# Patient Record
Sex: Male | Born: 1940
Health system: Southern US, Community
[De-identification: ages and names within clinical notes are randomized; demographics above are authoritative.]

## PROBLEM LIST (undated history)

## (undated) DIAGNOSIS — Z87442 Personal history of urinary calculi: Secondary | ICD-10-CM

## (undated) DIAGNOSIS — Z972 Presence of dental prosthetic device (complete) (partial): Secondary | ICD-10-CM

## (undated) DIAGNOSIS — N401 Enlarged prostate with lower urinary tract symptoms: Secondary | ICD-10-CM

## (undated) DIAGNOSIS — F528 Other sexual dysfunction not due to a substance or known physiological condition: Secondary | ICD-10-CM

## (undated) DIAGNOSIS — G629 Polyneuropathy, unspecified: Secondary | ICD-10-CM

## (undated) DIAGNOSIS — C61 Malignant neoplasm of prostate: Secondary | ICD-10-CM

## (undated) DIAGNOSIS — E119 Type 2 diabetes mellitus without complications: Secondary | ICD-10-CM

## (undated) DIAGNOSIS — M21371 Foot drop, right foot: Secondary | ICD-10-CM

## (undated) DIAGNOSIS — T7840XA Allergy, unspecified, initial encounter: Secondary | ICD-10-CM

## (undated) DIAGNOSIS — N529 Male erectile dysfunction, unspecified: Secondary | ICD-10-CM

## (undated) DIAGNOSIS — E785 Hyperlipidemia, unspecified: Secondary | ICD-10-CM

## (undated) DIAGNOSIS — M216X9 Other acquired deformities of unspecified foot: Secondary | ICD-10-CM

## (undated) DIAGNOSIS — E538 Deficiency of other specified B group vitamins: Secondary | ICD-10-CM

## (undated) DIAGNOSIS — N281 Cyst of kidney, acquired: Secondary | ICD-10-CM

## (undated) DIAGNOSIS — I10 Essential (primary) hypertension: Secondary | ICD-10-CM

## (undated) DIAGNOSIS — Z789 Other specified health status: Secondary | ICD-10-CM

## (undated) DIAGNOSIS — K5909 Other constipation: Secondary | ICD-10-CM

## (undated) DIAGNOSIS — G4700 Insomnia, unspecified: Secondary | ICD-10-CM

## (undated) HISTORY — DX: Personal history of urinary calculi: Z87.442

## (undated) HISTORY — DX: Other acquired deformities of unspecified foot: M21.6X9

## (undated) HISTORY — DX: Other sexual dysfunction not due to a substance or known physiological condition: F52.8

## (undated) HISTORY — PX: OTHER SURGICAL HISTORY: SHX169

## (undated) HISTORY — PX: LUMBAR MICRODISCECTOMY: SHX99

## (undated) HISTORY — PX: LUMBAR EPIDURAL INJECTION: SHX1980

## (undated) HISTORY — DX: Allergy, unspecified, initial encounter: T78.40XA

## (undated) HISTORY — DX: Type 2 diabetes mellitus without complications: E11.9

## (undated) HISTORY — PX: LUMBAR DISC SURGERY: SHX700

## (undated) HISTORY — PX: CARPAL TUNNEL RELEASE: SHX101

## (undated) HISTORY — DX: Hyperlipidemia, unspecified: E78.5

---
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
## 2000-08-25 ENCOUNTER — Encounter: Admission: RE | Admit: 2000-08-25 | Discharge: 2000-11-23 | Payer: Self-pay | Admitting: Internal Medicine

## 2001-12-25 ENCOUNTER — Encounter: Admission: RE | Admit: 2001-12-25 | Discharge: 2001-12-25 | Payer: Self-pay | Admitting: Orthopedic Surgery

## 2001-12-25 ENCOUNTER — Encounter: Payer: Self-pay | Admitting: Orthopedic Surgery

## 2002-01-15 ENCOUNTER — Encounter: Admission: RE | Admit: 2002-01-15 | Discharge: 2002-01-15 | Payer: Self-pay | Admitting: Orthopedic Surgery

## 2002-01-15 ENCOUNTER — Encounter: Payer: Self-pay | Admitting: Orthopedic Surgery

## 2002-01-29 ENCOUNTER — Encounter: Admission: RE | Admit: 2002-01-29 | Discharge: 2002-01-29 | Payer: Self-pay | Admitting: Orthopedic Surgery

## 2002-01-29 ENCOUNTER — Encounter: Payer: Self-pay | Admitting: Orthopedic Surgery

## 2002-05-10 ENCOUNTER — Encounter: Payer: Self-pay | Admitting: *Deleted

## 2002-05-13 ENCOUNTER — Encounter: Payer: Self-pay | Admitting: *Deleted

## 2002-05-13 ENCOUNTER — Ambulatory Visit (HOSPITAL_COMMUNITY): Admission: RE | Admit: 2002-05-13 | Discharge: 2002-05-14 | Payer: Self-pay | Admitting: *Deleted

## 2003-04-14 ENCOUNTER — Emergency Department (HOSPITAL_COMMUNITY): Admission: AD | Admit: 2003-04-14 | Discharge: 2003-04-14 | Payer: Self-pay | Admitting: Emergency Medicine

## 2003-04-14 ENCOUNTER — Encounter: Payer: Self-pay | Admitting: Emergency Medicine

## 2004-06-14 ENCOUNTER — Ambulatory Visit: Payer: Self-pay | Admitting: Internal Medicine

## 2004-07-13 ENCOUNTER — Ambulatory Visit: Payer: Self-pay | Admitting: Internal Medicine

## 2005-01-04 ENCOUNTER — Ambulatory Visit: Payer: Self-pay | Admitting: Internal Medicine

## 2005-01-09 ENCOUNTER — Ambulatory Visit: Payer: Self-pay | Admitting: Internal Medicine

## 2005-01-09 ENCOUNTER — Encounter (INDEPENDENT_AMBULATORY_CARE_PROVIDER_SITE_OTHER): Payer: Self-pay | Admitting: *Deleted

## 2005-06-28 ENCOUNTER — Ambulatory Visit: Payer: Self-pay | Admitting: Internal Medicine

## 2006-01-03 ENCOUNTER — Ambulatory Visit: Payer: Self-pay | Admitting: Internal Medicine

## 2006-01-06 ENCOUNTER — Ambulatory Visit: Payer: Self-pay | Admitting: Internal Medicine

## 2006-01-31 ENCOUNTER — Ambulatory Visit: Payer: Self-pay | Admitting: Internal Medicine

## 2006-06-27 ENCOUNTER — Ambulatory Visit: Payer: Self-pay | Admitting: Internal Medicine

## 2006-07-11 ENCOUNTER — Ambulatory Visit: Payer: Self-pay

## 2007-01-02 ENCOUNTER — Ambulatory Visit: Payer: Self-pay | Admitting: Internal Medicine

## 2007-01-02 LAB — CONVERTED CEMR LAB
ALT: 27 units/L (ref 0–53)
AST: 21 units/L (ref 0–37)
BUN: 17 mg/dL (ref 6–23)
CO2: 30 meq/L (ref 19–32)
Calcium: 9.4 mg/dL (ref 8.4–10.5)
Chloride: 107 meq/L (ref 96–112)
Cholesterol: 140 mg/dL (ref 0–200)
Creatinine, Ser: 0.8 mg/dL (ref 0.4–1.5)
GFR calc Af Amer: 125 mL/min
GFR calc non Af Amer: 103 mL/min
Glucose, Bld: 80 mg/dL (ref 70–99)
HDL: 48.8 mg/dL (ref >39.0)
Hgb A1c MFr Bld: 6.5 % — ABNORMAL HIGH (ref 4.6–6.0)
LDL Cholesterol: 85 mg/dL (ref 0–99)
PSA: 1.66 ng/mL (ref 0.10–4.00)
Potassium: 3.7 meq/L (ref 3.5–5.1)
Sodium: 143 meq/L (ref 135–145)
Total CHOL/HDL Ratio: 2.9
Triglycerides: 31 mg/dL (ref 0–149)
VLDL: 6 mg/dL (ref 0–40)

## 2007-06-19 ENCOUNTER — Ambulatory Visit: Payer: Self-pay | Admitting: Internal Medicine

## 2007-06-19 LAB — CONVERTED CEMR LAB
ALT: 22 units/L (ref 0–53)
AST: 20 units/L (ref 0–37)
Albumin: 3.6 g/dL (ref 3.5–5.2)
Alkaline Phosphatase: 69 units/L (ref 39–117)
BUN: 13 mg/dL (ref 6–23)
Basophils Absolute: 0 10*3/uL (ref 0.0–0.1)
Basophils Relative: 0.2 % (ref 0.0–1.0)
Bilirubin Urine: NEGATIVE
Bilirubin, Direct: 0.2 mg/dL (ref 0.0–0.3)
CO2: 31 meq/L (ref 19–32)
Calcium: 9.6 mg/dL (ref 8.4–10.5)
Chloride: 106 meq/L (ref 96–112)
Cholesterol: 141 mg/dL (ref 0–200)
Creatinine, Ser: 0.9 mg/dL (ref 0.4–1.5)
Eosinophils Absolute: 0.3 10*3/uL (ref 0.0–0.6)
Eosinophils Relative: 6.6 % — ABNORMAL HIGH (ref 0.0–5.0)
GFR calc Af Amer: 109 mL/min
GFR calc non Af Amer: 90 mL/min
Glucose, Bld: 115 mg/dL — ABNORMAL HIGH (ref 70–99)
HCT: 40.9 % (ref 39.0–52.0)
HDL: 43.7 mg/dL (ref >39.0)
Hemoglobin, Urine: NEGATIVE
Hemoglobin: 13.5 g/dL (ref 13.0–17.0)
Ketones, ur: NEGATIVE mg/dL
LDL Cholesterol: 87 mg/dL (ref 0–99)
Leukocytes, UA: NEGATIVE
Lymphocytes Relative: 50.5 % — ABNORMAL HIGH (ref 12.0–46.0)
MCHC: 33.1 g/dL (ref 30.0–36.0)
MCV: 79.8 fL (ref 78.0–100.0)
Monocytes Absolute: 0.4 10*3/uL (ref 0.2–0.7)
Monocytes Relative: 10.1 % (ref 3.0–11.0)
Neutro Abs: 1.4 10*3/uL (ref 1.4–7.7)
Neutrophils Relative %: 32.6 % — ABNORMAL LOW (ref 43.0–77.0)
Nitrite: NEGATIVE
PSA: 1.23 ng/mL (ref 0.10–4.00)
Platelets: 163 10*3/uL (ref 150–400)
Potassium: 4.4 meq/L (ref 3.5–5.1)
RBC: 5.13 M/uL (ref 4.22–5.81)
RDW: 12.8 % (ref 11.5–14.6)
Sodium: 143 meq/L (ref 135–145)
Specific Gravity, Urine: 1.02 (ref 1.000–1.03)
TSH: 0.78 microintl units/mL (ref 0.35–5.50)
Total Bilirubin: 1.1 mg/dL (ref 0.3–1.2)
Total CHOL/HDL Ratio: 3.2
Total Protein, Urine: NEGATIVE mg/dL
Total Protein: 6.8 g/dL (ref 6.0–8.3)
Triglycerides: 54 mg/dL (ref 0–149)
Urine Glucose: NEGATIVE mg/dL
Urobilinogen, UA: 0.2 (ref 0.0–1.0)
VLDL: 11 mg/dL (ref 0–40)
WBC: 4.4 10*3/uL — ABNORMAL LOW (ref 4.5–10.5)
pH: 6 (ref 5.0–8.0)

## 2007-06-21 ENCOUNTER — Encounter: Payer: Self-pay | Admitting: Internal Medicine

## 2007-06-25 ENCOUNTER — Encounter: Payer: Self-pay | Admitting: Internal Medicine

## 2007-06-25 DIAGNOSIS — M216X9 Other acquired deformities of unspecified foot: Secondary | ICD-10-CM | POA: Insufficient documentation

## 2007-06-25 DIAGNOSIS — E119 Type 2 diabetes mellitus without complications: Secondary | ICD-10-CM | POA: Insufficient documentation

## 2007-06-25 DIAGNOSIS — Z87442 Personal history of urinary calculi: Secondary | ICD-10-CM | POA: Insufficient documentation

## 2007-06-26 ENCOUNTER — Ambulatory Visit: Payer: Self-pay | Admitting: Internal Medicine

## 2007-06-26 DIAGNOSIS — E785 Hyperlipidemia, unspecified: Secondary | ICD-10-CM | POA: Insufficient documentation

## 2007-08-02 ENCOUNTER — Encounter: Admission: RE | Admit: 2007-08-02 | Discharge: 2007-08-02 | Payer: Self-pay | Admitting: Orthopedic Surgery

## 2007-09-03 ENCOUNTER — Ambulatory Visit: Payer: Self-pay | Admitting: Internal Medicine

## 2007-09-03 DIAGNOSIS — R634 Abnormal weight loss: Secondary | ICD-10-CM | POA: Insufficient documentation

## 2007-09-03 LAB — CONVERTED CEMR LAB
Basophils Absolute: 0 10*3/uL (ref 0.0–0.1)
Basophils Relative: 0.1 % (ref 0.0–1.0)
CO2: 31 meq/L (ref 19–32)
Creatinine, Ser: 0.8 mg/dL (ref 0.4–1.5)
GFR calc Af Amer: 125 mL/min
HCT: 44.6 % (ref 39.0–52.0)
Hemoglobin: 14.2 g/dL (ref 13.0–17.0)
Lymphocytes Relative: 35.5 % (ref 12.0–46.0)
MCHC: 31.9 g/dL (ref 30.0–36.0)
Monocytes Absolute: 0.4 10*3/uL (ref 0.2–0.7)
Monocytes Relative: 5.7 % (ref 3.0–11.0)
Neutro Abs: 3.9 10*3/uL (ref 1.4–7.7)
Neutrophils Relative %: 58 % (ref 43.0–77.0)
Potassium: 4.3 meq/L (ref 3.5–5.1)
RDW: 13.1 % (ref 11.5–14.6)
Sodium: 140 meq/L (ref 135–145)

## 2007-09-04 ENCOUNTER — Ambulatory Visit: Payer: Self-pay | Admitting: Internal Medicine

## 2007-09-09 ENCOUNTER — Encounter: Payer: Self-pay | Admitting: Internal Medicine

## 2008-01-06 ENCOUNTER — Ambulatory Visit: Payer: Self-pay | Admitting: Internal Medicine

## 2008-01-06 LAB — CONVERTED CEMR LAB
Chloride: 104 meq/L (ref 96–112)
GFR calc Af Amer: 109 mL/min
GFR calc non Af Amer: 90 mL/min
Hgb A1c MFr Bld: 6.1 % — ABNORMAL HIGH (ref 4.6–6.0)
LDL Cholesterol: 77 mg/dL (ref 0–99)
PSA: 1.15 ng/mL (ref 0.10–4.00)
Potassium: 3.9 meq/L (ref 3.5–5.1)
Sodium: 141 meq/L (ref 135–145)
VLDL: 20 mg/dL (ref 0–40)

## 2008-01-07 ENCOUNTER — Encounter: Payer: Self-pay | Admitting: Internal Medicine

## 2008-01-15 ENCOUNTER — Encounter: Payer: Self-pay | Admitting: Internal Medicine

## 2008-04-04 ENCOUNTER — Ambulatory Visit: Payer: Self-pay | Admitting: Internal Medicine

## 2008-04-04 DIAGNOSIS — F528 Other sexual dysfunction not due to a substance or known physiological condition: Secondary | ICD-10-CM | POA: Insufficient documentation

## 2008-04-05 ENCOUNTER — Telehealth: Payer: Self-pay | Admitting: Internal Medicine

## 2008-06-20 ENCOUNTER — Ambulatory Visit: Payer: Self-pay | Admitting: Internal Medicine

## 2008-06-20 LAB — CONVERTED CEMR LAB
ALT: 29 units/L (ref 0–53)
AST: 23 units/L (ref 0–37)
Albumin: 4.2 g/dL (ref 3.5–5.2)
Alkaline Phosphatase: 73 units/L (ref 39–117)
BUN: 14 mg/dL (ref 6–23)
CO2: 29 meq/L (ref 19–32)
Chloride: 105 meq/L (ref 96–112)
Cholesterol: 170 mg/dL (ref 0–200)
GFR calc non Af Amer: 90 mL/min
Glucose, Bld: 98 mg/dL (ref 70–99)
Potassium: 3.8 meq/L (ref 3.5–5.1)

## 2009-01-12 ENCOUNTER — Ambulatory Visit: Payer: Self-pay | Admitting: Internal Medicine

## 2009-01-12 LAB — CONVERTED CEMR LAB
AST: 28 units/L (ref 0–37)
Bilirubin, Direct: 0.2 mg/dL (ref 0.0–0.3)
CO2: 30 meq/L (ref 19–32)
Calcium: 9.8 mg/dL (ref 8.4–10.5)
Glucose, Bld: 83 mg/dL (ref 70–99)
HDL: 62 mg/dL (ref >39.00)
Potassium: 4 meq/L (ref 3.5–5.1)
Sodium: 139 meq/L (ref 135–145)
Total Bilirubin: 1 mg/dL (ref 0.3–1.2)
Total CHOL/HDL Ratio: 3

## 2009-01-16 ENCOUNTER — Encounter: Payer: Self-pay | Admitting: Internal Medicine

## 2009-01-16 LAB — HM DIABETES FOOT EXAM

## 2009-01-25 ENCOUNTER — Telehealth: Payer: Self-pay | Admitting: Internal Medicine

## 2009-01-26 ENCOUNTER — Telehealth: Payer: Self-pay | Admitting: Internal Medicine

## 2009-01-27 ENCOUNTER — Telehealth: Payer: Self-pay | Admitting: Internal Medicine

## 2009-02-22 ENCOUNTER — Telehealth: Payer: Self-pay | Admitting: Internal Medicine

## 2009-02-23 ENCOUNTER — Telehealth: Payer: Self-pay | Admitting: Internal Medicine

## 2009-03-16 ENCOUNTER — Telehealth: Payer: Self-pay | Admitting: Internal Medicine

## 2009-04-18 ENCOUNTER — Ambulatory Visit: Payer: Self-pay | Admitting: Internal Medicine

## 2009-04-19 ENCOUNTER — Ambulatory Visit: Payer: Self-pay | Admitting: Internal Medicine

## 2009-06-05 ENCOUNTER — Ambulatory Visit: Payer: Self-pay | Admitting: Internal Medicine

## 2009-06-05 LAB — CONVERTED CEMR LAB
Albumin: 4.2 g/dL (ref 3.5–5.2)
Chloride: 105 meq/L (ref 96–112)
Cholesterol: 177 mg/dL (ref 0–200)
Hgb A1c MFr Bld: 7 % — ABNORMAL HIGH (ref 4.6–6.5)
LDL Cholesterol: 98 mg/dL (ref 0–99)
Potassium: 3.9 meq/L (ref 3.5–5.1)
Total Protein: 7.3 g/dL (ref 6.0–8.3)
Triglycerides: 76 mg/dL (ref 0.0–149.0)
VLDL: 15.2 mg/dL (ref 0.0–40.0)

## 2009-11-14 ENCOUNTER — Telehealth: Payer: Self-pay | Admitting: Internal Medicine

## 2010-01-02 ENCOUNTER — Ambulatory Visit: Payer: Self-pay | Admitting: Internal Medicine

## 2010-01-02 LAB — CONVERTED CEMR LAB
ALT: 37 units/L (ref 0–53)
BUN: 12 mg/dL (ref 6–23)
Cholesterol: 181 mg/dL (ref 0–200)
GFR calc non Af Amer: 141.9 mL/min (ref >60)
HDL: 69.1 mg/dL (ref >39.00)
Hgb A1c MFr Bld: 6.5 % (ref 4.6–6.5)
Potassium: 4 meq/L (ref 3.5–5.1)
Sodium: 142 meq/L (ref 135–145)
Total Protein: 7.1 g/dL (ref 6.0–8.3)
VLDL: 31.8 mg/dL (ref 0.0–40.0)

## 2010-01-11 ENCOUNTER — Encounter (INDEPENDENT_AMBULATORY_CARE_PROVIDER_SITE_OTHER): Payer: Self-pay | Admitting: *Deleted

## 2010-01-17 ENCOUNTER — Encounter: Payer: Self-pay | Admitting: Internal Medicine

## 2010-01-24 ENCOUNTER — Telehealth: Payer: Self-pay | Admitting: Internal Medicine

## 2010-03-09 ENCOUNTER — Telehealth: Payer: Self-pay | Admitting: Internal Medicine

## 2010-03-26 ENCOUNTER — Telehealth: Payer: Self-pay | Admitting: Internal Medicine

## 2010-04-12 ENCOUNTER — Ambulatory Visit: Payer: Self-pay | Admitting: Internal Medicine

## 2010-08-09 NOTE — Progress Notes (Signed)
  Phone Note Refill Request Message from:  Fax from Pharmacy on Nov 14, 2009 2:57 PM  Refills Requested: Medication #1:  ALPRAZOLAM 0.25 MG  TABS 1 q6 as needed   Last Refilled: 06/05/2009  Is this ok to fill for pt to [facility]  Initial call taken by: Rock Nephew CMA,  Nov 14, 2009 2:57 PM  Follow-up for Phone Call        ok fro refill x 5 Follow-up by: Jacques Navy MD,  Nov 14, 2009 5:39 PM    Prescriptions: ALPRAZOLAM 0.25 MG  TABS (ALPRAZOLAM) 1 q6 as needed  #30 x 5   Entered by:   Ami Bullins CMA   Authorized by:   Jacques Navy MD   Signed by:   Bill Salinas CMA on 11/15/2009   Method used:   Telephoned to ...       [facility] [address] [phone]* (retail)       [address]       [place], [place]  [address]       Ph: [phone] or [phone]       Fax: [phone]   RxID:   [id number]
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
## 2010-08-09 NOTE — Letter (Signed)
Summary: [facility]   Imported By: Lennie Odor 01/25/2010 10:24:21  _____________________________________________________________________  External Attachment:    Type:   Image     Comment:   External Document

## 2010-08-09 NOTE — Progress Notes (Signed)
  Phone Note Call from Patient Call back at Home Phone [phone]   Caller: Patient Call For: Jacques Navy MD Summary of Call: Pt needs cholesterol medicine,simvistatin. Pt gets this mail order and had not rec'd. Mail order is [facility]. Initial call taken by: Verdell Face,  March 09, 2010 12:31 PM  Follow-up for Phone Call        Pt informed  Follow-up by: Lamar Sprinkles, CMA,  March 09, 2010 3:12 PM    Prescriptions: SIMVASTATIN 20 MG TABS (SIMVASTATIN) Take 1 tablet by mouth every night  #14 x 1   Entered by:   Lamar Sprinkles, CMA   Authorized by:   Jacques Navy MD   Signed by:   Lamar Sprinkles, CMA on 03/09/2010   Method used:   Electronically to        [facility] (retail)       [address]       [place], [place]  [address]       Ph: [phone]       Fax: [phone]   RxID:   [id number] SIMVASTATIN 20 MG TABS (SIMVASTATIN) Take 1 tablet by mouth every night  #90 x 3   Entered by:   Lamar Sprinkles, CMA   Authorized by:   Jacques Navy MD   Signed by:   Lamar Sprinkles, CMA on 03/09/2010   Method used:   Faxed to ...       [facility]* (retail)             ,          Ph: [phone]       Fax: [phone]   RxID:   [id number]

## 2010-08-09 NOTE — Progress Notes (Signed)
       Past History:  Care Management: Ophthalmology:Pt had diabetic eye exam 01/17/2010 at [facility]. No diabetic rentinopathy detected. Repeat eye exam in 1 year. Dr Antony Contras

## 2010-08-09 NOTE — Assessment & Plan Note (Signed)
Summary: FLU SHOT/NWS  Nurse Visit   Allergies: No Known Drug Allergies  Orders Added: 1)  Flu Vaccine 3yrs + MEDICARE PATIENTS [Q2039] 2)  Administration Flu vaccine - MCR [G0008] Flu Vaccine Consent Questions     Do you have a history of severe allergic reactions to this vaccine? no    Any prior history of allergic reactions to egg and/or gelatin? no    Do you have a sensitivity to the preservative Thimersol? no    Do you have a past history of Guillan-Barre Syndrome? no    Do you currently have an acute febrile illness? no    Have you ever had a severe reaction to latex? no    Vaccine information given and explained to patient? yes    Are you currently pregnant? no    Lot Number:[id number]   Exp Date:01/05/2011   Site Given  Left Deltoid IM 

## 2010-08-09 NOTE — Progress Notes (Signed)
Summary: Pharm problem?   Phone Note Call from Patient   Summary of Call: Pt left vm that too many mistakes have been made regarding getting his refills. He is req that [name] "look into this".  Initial call taken by: Lamar Sprinkles, CMA,  March 26, 2010 2:03 PM  Follow-up for Phone Call        No requests from pharmacy for refills. Left detailed vm on home # stating the last rx request dates and when& where they were sent. ALso asked the patient to call the office back with any further questions or refill requests.  Follow-up by: Lamar Sprinkles, CMA,  March 30, 2010 6:51 PM  Additional Follow-up for Phone Call Additional follow up Details #1::        Left another vm for pt to contact office if we could be of any further help Additional Follow-up by: Lamar Sprinkles, CMA,  April 05, 2010 3:53 PM

## 2010-08-09 NOTE — Letter (Signed)
Summary: Colonoscopy Date Change Letter  [facility]  [address] [place], [place] [address]   Phone: [phone]  Fax: [phone]      January 11, 2010 MRN: [id number]   Alex Massey [address] [place], [place]  [address]   Dear Alex Massey,   Previously you were recommended to have a repeat colonoscopy around this time. Your chart was recently reviewed by Dr. Hedwig Morton. Juanda Chance of [facility]. Follow up colonoscopy is now recommended in July 2016. This revised recommendation is based on current, nationally recognized guidelines for colorectal cancer screening and polyp surveillance. These guidelines are endorsed by the American Cancer Society, [employer] on Colorectal Cancer as well as numerous other major medical organizations.  Please understand that our recommendation assumes that you do not have any new symptoms such as bleeding, a change in bowel habits, anemia, or significant abdominal discomfort. If you do have any concerning GI symptoms or want to discuss the guideline recommendations, please call to arrange an office visit at your earliest convenience. Otherwise we will keep you in our reminder system and contact you 1-2 months prior to the date listed above to schedule your next colonoscopy.  Thank you,  Hedwig Morton. Juanda Chance, M.D.  [facility] [phone]

## 2010-11-20 NOTE — Assessment & Plan Note (Signed)
Alex Massey                           PRIMARY CARE OFFICE NOTE   NAME:Alex Massey                    MRN:          [id number]  DATE:01/02/2007                            DOB:          Nov 16, 1940    [name] is a delightful 69 year old African American gentleman who  presents today for followup evaluation and exam.  He was last seen in  the office June 27, 2006 for followup of his diabetes which was  generally well controlled with a hemoglobin A1c of 6%.   INTERVAL HISTORY:  The patient reports he has been doing well.  He asks  about multivitamins, but does admit to have a good diet.  He asks about  vitamin D but he does get a significant amount of sun exposure.  Patient  has had a 4 pounds weight loss over the last 5 months but actually is  close to his baseline weight.  He does work in a very hot environment.   Past medical history is well documented in my note of January 06, 2006 as is  family history and social history.   REVIEW OF SYSTEMS:  Is negative for any constitutional, cardiovascular,  respiratory, GI, or GU problems.   EXAMINATION:  Temperature was 98, blood pressure 124/79, pulse 74,  weight 150.8.  GENERAL APPEARANCE:  A well-nourished, well-developed African American  gentleman looking younger than his states chronologic age in no acute  distress.  HEENT:  Normocephalic/atraumatic, EACs and TMs were normal.  Oropharynx  with native dentition in good repair, no buccal or palatal lesions were  noted, posterior pharynx was clear.  Conjunctivae and sclerae was clear.  PERRLA/EOMI, funduscopic exam was unremarkable.  NECK:  Supple without thyromegaly.  NODES:  No adenopathy was noted in the cervical, supraclavicular  regions.  CHEST:  No CVA tenderness.  LUNGS:  Clear to auscultation and percussion.  CARDIOVASCULAR:  With 2+ radial pulse, no JVD or carotid bruits.  He had  a quiet precordium with a regular rate and rhythm  without murmurs, rubs,  or gallops.  ABDOMEN:  Soft, no guarding or rebound, no organosplenomegaly was noted.  GENITALIA:  Normal.  RECTAL:  Normal sphincter tone was noted.  Prostate is smooth, round and  normal in size and contour without modules.  EXTREMITIES:  Without clubbing, cyanosis, edema, or deformity.  NEUROLOGIC:  Exam was nonfocal.   LABORATORY:  Basic metabolic panel was normal with potassium of 3.7,  serum glucose was 80.  Kidney function was normal with a creatinine of  0.8.  Liver functions were normal with an SGPT of 27, SGOT of 21.  Lipid  panel with a cholesterol of 140, triglycerides of 31, LDL was normal at  85, HDL was normal at 48.8.  Hemoglobin A1c was 6.5%.  PSA was normal at  1.66.   ASSESSMENT/PLAN:  1. Diabetes.  Patient is very well-controlled on his present medical      regimen of Amaryl 2 mg daily and he will continue the same.  2. Hyperlipidemia.  Patient is at goal with an LDL of less than 100 on  low dose simvastatin and he will continue the same.  3. Health maintenance.  The patient did have a nuclear stress test      July 11, 2006 which was read out as a negative study with no      evidence of ischemia.  Last colonoscopy was performed January 09, 2005,      it was a normal study and patient would be a candidate for followup      study in 5 years making it 2011.   In summary he is a very delightful gentleman who seems to be medically  stable at this time.  He will continue on his medications.  Prescriptions were provided as needed.  He will return to see me in 6  months or on a p.r.n. basis.     Rosalyn Gess Norins, MD  Electronically Signed    MEN/MedQ  DD: 01/04/2007  DT: 01/04/2007  Job #: [id number]   cc:   Alex Massey

## 2010-11-23 NOTE — Op Note (Signed)
NAMEGERVIS, Alex Massey                       ACCOUNT NO.:  [id number]   MEDICAL RECORD NO.:  [id number]                   PATIENT TYPE:  OIB   LOCATION:  2550                                 FACILITY:  [facility]   PHYSICIAN:  Reynolds Bowl, M.D.                 DATE OF BIRTH:  1940/07/18   DATE OF PROCEDURE:  DATE OF DISCHARGE:                                 OPERATIVE REPORT   PREOPERATIVE DIAGNOSES:  1. Recurrent herniated nucleus pulposus, status post postoperative     diskectomy in 1984 for the recurrent L4-5 herniated nucleus pulposus,     right.  2. Status post postoperative meniscectomy in 1984.   POSTOPERATIVE DIAGNOSES:  1. Recurrent herniated nucleus pulposus, status post postoperative     diskectomy in 1984 for the recurrent L4-5 herniated nucleus pulposus,     right.  2. Status post postoperative meniscectomy in 1984.   PROCEDURE:  Microscopic assisted diskectomy, foraminotomy with complete  nerve root decompression.   SURGEON:  Reynolds Bowl, M.D.   ASSISTANT:  Humberto Leep. Wyonia Hough, M.D.   ANESTHESIA:  General.   DESCRIPTION OF PROCEDURE:  The patient had an IV started and was given  2 gm  of Ancef. He was taken to the operating room. He was given a general  anesthetic by endotracheal tube. He was  placed in the [place] frame. He  was  prepped and draped in the usual manner for sterility.   I used little more than the initial incision. I went down to what I thought  was the L4-5 interspace. I took x-rays and confirmed. The paraspinal muscles  were reflected subperiosteally laterally, exposing  then the lamina of L4  and L5 with the right interspace. Using small curets I  went inferiorly,  laterally and superiorly until all the soft tissue was freed, and carefully  dissected further laterally, proximally and  distally and then unroofed the  lateral bone to the level of the pedicle.   I then went out laterally distal to the pedicle with a foraminotomy. I  followed this by careful dissection of the nerve root and this allowed the  nerve root to be moved medially. It was fairly tight and there was disk  material up under the annulus and under the superior edge of the L5. There  was disk material contributing to the nerve tightness.   The annulus was very soft and bulging. This was opened, and using Epsteins,  I pushed tissue out from this pocket described above and freed up disk  tissue posteriorly, and then with multiple passes of the various rongeurs  removed disk material down to a fairly empty area that had no more easily  removable material.  The nerve root then could be moved fairly easily and a  probe was passed under the nerve root medially and distally all around the  pedicle and it all seemed to be free.  Having  done that, the interspace was irrigated. I removed the [facility]  retractor. We repaired the lumbar fascia with figure-of-eight sutures and #1  Vicryl. We approximated the more superficial tissues with 2-0 Vicryl and  then approximated the skin with metal staples and applied a dressing.   The patient returned to the recovery room in good condition.  Estimated blood loss was negligible.                                                Reynolds Bowl, M.D.    JWK/MEDQ  D:  05/13/2002  T:  05/13/2002  Job:  [id number]

## 2010-11-23 NOTE — H&P (Signed)
NAME:  Alex Massey, Alex Massey                       ACCOUNT NO.:  [id number]   MEDICAL RECORD NO.:  [id number]                   PATIENT TYPE:  OIB   LOCATION:  NA                                   FACILITY:  [facility]   PHYSICIAN:  Reynolds Bowl, M.D.                 DATE OF BIRTH:  1940/12/08   DATE OF ADMISSION:  DATE OF DISCHARGE:                                HISTORY & PHYSICAL   HISTORY OF PRESENT ILLNESS:  The patient is a 70 year old man with chief  complain of pain in the left lower extremity going through the hip and 90%  around his calf and ankle.   In 1998, this patient underwent diskectomy at L4-5 and L5-S1 on the right  for right lower extremity pain.   The patient did well until recently and then developed recurrence of the  pain going down the right lower extremity.  He had an MRI showing recurrence  of HNP.  He has subsequently had three epidural steroids without relief, and  he feels now that he is more and more symptomatic and weak; therefore, we  discussed surgery with all the possible multiple complications and the fact  there are no guarantees.  He would like to proceed on with the surgery.   ALLERGIES:  None known.   MEDICATIONS:  Daily medications have been Vicodin and Naproxen.  He is  stopping the Naproxen today and will begin taking Tylox.   SOCIAL HISTORY:  He occasionally smokes cigarettes.   REVIEW OF SYSTEMS:  He has no significant cardiorespiratory, GI, or GU  symptoms and states that he has type 2  diet-controlled diabetes.   PHYSICAL EXAMINATION:  VITAL SIGNS:  He is 5 feet 7-1/2 inches tall, weighs  158 pounds.  Blood pressure 126/86, pulse 64, respirations 20.  HEENT:  Extraocular movements full.  Tympanic membranes are intact, good  light reflex.  NECK:  There are no carotid bruits,no cervical masses.  CHEST:  Good volume, and breath sounds are clear.  HEART:  Regular rhythm and no murmurs heard.  ABDOMEN:  Round and soft.  No palpable masses  and no tenderness.  Bowel  sounds are normal.  EXTREMITIES:  Femoral and dorsalis pedis pulses are good.  His straight leg  raising immediately on the right causes a sciatica.  The right ankle jerk is  0. There is pain all about the ankle and calf.  I can easily overcome  dorsiflexion, eversion, and EHL, but he can elevate up on a stool.  He can  elevate up on his toes.   LABORATORY DATA:  Plain x-rays show good alignment, narrowing with some  sclerosis at L5-S1, minimal narrowing at L4-5.   MRI shows recurrent HNP at L4-5 on the right which extends inferiorly and  laterally.  At L5-S1, there are postoperative changes but no apparent nerve  root encroachment.   ADMITTING DIAGNOSES:  1. Recurrent herniated  nucleus pulposus L4-5 right status post postoperative     L4-5 and L5-S1 herniated nucleus pulposus in 1998.  2. Diet-controlled diabetes mellitus.   PLAN:  Diskectomy at L4-5 on the right.  This has been fully discussed.  No  guarantees have been given.  He has been given a prescription for Tylox and  Celebrex.  I plan on seeing him postoperatively one week.                                               Reynolds Bowl, M.D.    JWK/MEDQ  D:  05/05/2002  T:  05/05/2002  Job:  [id number]

## 2011-01-08 ENCOUNTER — Encounter: Payer: Self-pay | Admitting: Internal Medicine

## 2011-01-14 ENCOUNTER — Encounter: Payer: Self-pay | Admitting: Internal Medicine

## 2011-01-14 ENCOUNTER — Other Ambulatory Visit (INDEPENDENT_AMBULATORY_CARE_PROVIDER_SITE_OTHER): Payer: Medicare Other

## 2011-01-14 ENCOUNTER — Ambulatory Visit (INDEPENDENT_AMBULATORY_CARE_PROVIDER_SITE_OTHER): Payer: Medicare Other | Admitting: Internal Medicine

## 2011-01-14 VITALS — BP 122/72 | HR 74 | Temp 97.8°F | Ht 66.5 in | Wt 159.0 lb

## 2011-01-14 DIAGNOSIS — E785 Hyperlipidemia, unspecified: Secondary | ICD-10-CM

## 2011-01-14 DIAGNOSIS — Z125 Encounter for screening for malignant neoplasm of prostate: Secondary | ICD-10-CM

## 2011-01-14 DIAGNOSIS — E119 Type 2 diabetes mellitus without complications: Secondary | ICD-10-CM

## 2011-01-14 DIAGNOSIS — Z0000 Encounter for general adult medical examination without abnormal findings: Secondary | ICD-10-CM

## 2011-01-14 DIAGNOSIS — Z136 Encounter for screening for cardiovascular disorders: Secondary | ICD-10-CM

## 2011-01-14 DIAGNOSIS — Z0001 Encounter for general adult medical examination with abnormal findings: Secondary | ICD-10-CM | POA: Insufficient documentation

## 2011-01-14 LAB — COMPREHENSIVE METABOLIC PANEL
ALT: 22 U/L (ref 0–53)
BUN: 15 mg/dL (ref 6–23)
CO2: 27 mEq/L (ref 19–32)
Creatinine, Ser: 0.8 mg/dL (ref 0.4–1.5)
GFR: 125.07 mL/min (ref >60.00)
Total Bilirubin: 0.9 mg/dL (ref 0.3–1.2)

## 2011-01-14 LAB — HEPATIC FUNCTION PANEL
ALT: 22 U/L (ref 0–53)
AST: 21 U/L (ref 0–37)
Bilirubin, Direct: 0.2 mg/dL (ref 0.0–0.3)
Total Protein: 6.8 g/dL (ref 6.0–8.3)

## 2011-01-14 LAB — PSA: PSA: 1.83 ng/mL (ref 0.10–4.00)

## 2011-01-14 LAB — LIPID PANEL
Cholesterol: 165 mg/dL (ref 0–200)
HDL: 73.6 mg/dL (ref >39.00)
VLDL: 14 mg/dL (ref 0.0–40.0)

## 2011-01-14 LAB — HEMOGLOBIN A1C: Hgb A1c MFr Bld: 7.2 % — ABNORMAL HIGH (ref 4.6–6.5)

## 2011-01-14 NOTE — Progress Notes (Signed)
Subjective:    Patient ID: Alex Massey, male    DOB: May 18, 1941, 70 y.o.   MRN: [id number]  HPI   The patient is here for annual Medicare wellness examination and management of other chronic and acute problems. He has been feeling great. He has had some pain in the right foot and occasional feeling of coldness in the legs.    The risk factors are reflected in the social history.  The roster of all physicians providing medical care to patient - is listed in the Snapshot section of the chart.  Activities of daily living:  The patient is 100% inedpendent in all ADLs: dressing, toileting, feeding as well as independent mobility  Home safety : The patient has smoke detectors in the home. They wear seatbelts.  Firearms are present in the home, kept in a safe fashion. There is no violence in the home.   There is no risks for hepatitis, STDs or HIV. There is no   history of blood transfusion. They have no travel history to infectious disease endemic areas of the world.  The patient has seen their dentist in the last six month. They have seen their eye doctor in the last year and had his yearly appointment coming up next week. They deny  any hearing difficulty and have not had audiologic testing in the last year.  They do not  have excessive sun exposure. Discussed the need for sun protection: hats, long sleeves and use of sunscreen if there is significant sun exposure.   Diet: the importance of a healthy diet is discussed. They do have a healthy diet.  The patient has a regular exercise program: walking , 1 hour duration, 7 days per week.  The benefits of regular aerobic exercise were discussed.  Depression screen: there are no signs or vegative symptoms of depression- irritability, change in appetite, anhedonia, sadness/tearfullness.  Cognitive assessment: the patient manages all their financial and personal affairs and is actively engaged. They could relate day,date,year and events;  recalled 3/3 objects at 3 minutes; performed clock-face test normally.  The following portions of the patient's history were reviewed and updated as appropriate: allergies, current medications, past family history, past medical history,  past surgical history, past social history  and problem list.  Vision, hearing, body mass index were assessed and reviewed.   During the course of the visit the patient was educated and counseled about appropriate screening and preventive services including : fall prevention , diabetes screening, nutrition counseling, colorectal cancer screening, and recommended immunizations.  Past Medical History  Diagnosis Date  . DIABETES MELLITUS, TYPE II 06/25/2007  . ERECTILE DYSFUNCTION 04/04/2008  . FOOT DROP, RIGHT 06/25/2007  . HYPERLIPIDEMIA 06/26/2007  . NEPHROLITHIASIS, HX OF 06/25/2007  . WEIGHT LOSS 09/03/2007   Past Surgical History  Procedure Date  . L-spine surgery    Family History  Problem Relation Age of Onset  . Diabetes Father   . Colon cancer Brother    History   Social History  . Marital Status: Married    Spouse Name: N/A    Number of Children: N/A  . Years of Education: N/A   Occupational History  . Not on file.   Social History Main Topics  . Smoking status: Former Smoker    Types: Cigarettes    Quit date: 07/09/2004  . Smokeless tobacco: Not on file  . Alcohol Use: Not on file  . Drug Use: Not on file  . Sexually Active: Not on file  Other Topics Concern  . Not on file   Social History Narrative  . No narrative on file       Review of Systems Review of Systems  Constitutional:  Negative for fever, chills, activity change and unexpected weight change.  HEENT:  Negative for hearing loss, ear pain, congestion, neck stiffness and postnasal drip. Negative for sore throat or swallowing problems. Negative for dental complaints.   Eyes: Negative for vision loss or change in visual acuity.  Respiratory: Negative for  chest tightness and wheezing.   Cardiovascular: Negative for chest pain and palpitation. No decreased exercise tolerance Gastrointestinal: No change in bowel habit. No bloating or gas. No reflux or indigestion Genitourinary: Negative for urgency, frequency, flank pain and difficulty urinating.  Musculoskeletal: Negative for myalgias, back pain, arthralgias and gait problem.  Neurological: Negative for dizziness, tremors, weakness and headaches.  Hematological: Negative for adenopathy.  Psychiatric/Behavioral: Negative for behavioral problems and dysphoric mood.       Objective:   Physical Exam Vital signs reviewed Gen'l: Well nourished well developed AA male in no acute distress  HENT:  Head: Normocephalic and atraumatic.  Right Ear: External ear normal. EAC/TM nl Left Ear: External ear normal.  EAC/TM nl Nose: Nose normal.  Mouth/Throat: Oropharynx is clear and moist. Dentition - upper denture, native mandible but missing several teeth,in good repair. No buccal or palatal lesions. Posterior pharynx clear. Eyes: Conjunctivae and sclera clear. EOM intact. Pupils are equal, round, and reactive to light. Right eye exhibits no discharge. Left eye exhibits no discharge. Neck: Normal range of motion. Neck supple. No JVD present. No tracheal deviation present. No thyromegaly present.  Cardiovascular: Normal rate, regular rhythm, no gallop, no friction rub, no murmur heard.      Quiet precordium. 2+ radial and DP pulses . No carotid bruits Pulmonary/Chest: Effort normal. No respiratory distress or increased WOB, no wheezes, no rales. No chest wall deformity or CVAT. Abdominal: Soft. Bowel sounds are normal in all quadrants. He exhibits no distension, no tenderness, no rebound or guarding, No heptosplenomegaly  Genitourinary:  deferred to PSA ordered Musculoskeletal: Normal range of motion. He exhibits no edema and no tenderness.       Small and large joints without redness, synovial thickening  or deformity. Full range of motion preserved about all small, median and large joints.  Lymphadenopathy:    He has no cervical or supraclavicular adenopathy.  Neurological: He is alert and oriented to person, place, and time. CN II-XII intact. DTRs 2+ and symmetrical biceps, radial and patellar tendons. Cerebellar function normal with no tremor, rigidity, normal gait and station.  Skin: Skin is warm and dry. No rash noted. No erythema.  Psychiatric: He has a normal mood and affect. His behavior is normal. Thought content normal.   Lab Results  Component Value Date   WBC 6.6 09/03/2007   HGB 14.2 09/03/2007   HCT 44.6 09/03/2007   PLT 150 09/03/2007   CHOL 165 01/14/2011   TRIG 70.0 01/14/2011   HDL 73.60 01/14/2011   ALT 22 01/14/2011   ALT 22 01/14/2011   AST 21 01/14/2011   AST 21 01/14/2011   NA 141 01/14/2011   K 3.6 01/14/2011   CL 103 01/14/2011   CREATININE 0.8 01/14/2011   BUN 15 01/14/2011   CO2 27 01/14/2011   TSH 0.97 01/14/2011   PSA 1.83 01/14/2011   HGBA1C 7.2* 01/14/2011   Lab Results  Component Value Date   LDLCALC 77 01/14/2011  Assessment & Plan:

## 2011-01-14 NOTE — Assessment & Plan Note (Signed)
Doing very well. No hypoglycemic episodes. He remains smart about his diet. A1C 7.21% reflecting good control.  Plan - continue present medication

## 2011-01-14 NOTE — Assessment & Plan Note (Signed)
Interval history is unremarkable and stable. Physical exam is normal. Lab results are in normal limits. He is current with colorectal cancer screening with last study July '06. PSA is 1.83 and less than in 2010. Immunizations: Tdap June '11; Pneumonia vaccine June '11; he is a candidate for shingles vaccine if covered by insurance. He is cognitively intact and doing well managing all his personal affairs.  In summary - a very nice gentleman who is medically stable. He will return on an as needed basis or in 1 year.

## 2011-01-14 NOTE — Assessment & Plan Note (Signed)
Excellent control on present medication with LDL of 77  Plan - continue present medication

## 2011-01-22 LAB — HM DIABETES EYE EXAM

## 2011-01-24 ENCOUNTER — Encounter: Payer: Self-pay | Admitting: Internal Medicine

## 2011-02-18 ENCOUNTER — Other Ambulatory Visit: Payer: Self-pay | Admitting: *Deleted

## 2011-02-18 MED ORDER — GLIMEPIRIDE 4 MG PO TABS: 4.0000 mg | ORAL_TABLET | Freq: Every day | ORAL | Status: DC

## 2011-02-18 MED ORDER — SIMVASTATIN 20 MG PO TABS: 20.0000 mg | ORAL_TABLET | Freq: Every day | ORAL | Status: DC

## 2011-03-04 ENCOUNTER — Other Ambulatory Visit: Payer: Self-pay | Admitting: *Deleted

## 2011-03-04 MED ORDER — GLIMEPIRIDE 4 MG PO TABS: 2.0000 mg | ORAL_TABLET | Freq: Every day | ORAL | Status: DC

## 2011-04-09 ENCOUNTER — Ambulatory Visit (INDEPENDENT_AMBULATORY_CARE_PROVIDER_SITE_OTHER): Payer: Medicare Other

## 2011-04-09 DIAGNOSIS — Z23 Encounter for immunization: Secondary | ICD-10-CM

## 2011-05-11 ENCOUNTER — Other Ambulatory Visit: Payer: Self-pay | Admitting: Internal Medicine

## 2011-08-02 ENCOUNTER — Other Ambulatory Visit: Payer: Self-pay | Admitting: Internal Medicine

## 2011-08-02 NOTE — Telephone Encounter (Signed)
Done

## 2012-01-06 ENCOUNTER — Ambulatory Visit (INDEPENDENT_AMBULATORY_CARE_PROVIDER_SITE_OTHER): Payer: Medicare Other | Admitting: Internal Medicine

## 2012-01-06 ENCOUNTER — Encounter: Payer: Self-pay | Admitting: Internal Medicine

## 2012-01-06 ENCOUNTER — Other Ambulatory Visit (INDEPENDENT_AMBULATORY_CARE_PROVIDER_SITE_OTHER): Payer: Medicare Other

## 2012-01-06 VITALS — BP 138/90 | HR 72 | Temp 98.3°F | Resp 16 | Ht 67.0 in | Wt 161.0 lb

## 2012-01-06 DIAGNOSIS — E119 Type 2 diabetes mellitus without complications: Secondary | ICD-10-CM

## 2012-01-06 DIAGNOSIS — R634 Abnormal weight loss: Secondary | ICD-10-CM

## 2012-01-06 DIAGNOSIS — E785 Hyperlipidemia, unspecified: Secondary | ICD-10-CM

## 2012-01-06 DIAGNOSIS — Z0000 Encounter for general adult medical examination without abnormal findings: Secondary | ICD-10-CM

## 2012-01-06 DIAGNOSIS — F528 Other sexual dysfunction not due to a substance or known physiological condition: Secondary | ICD-10-CM

## 2012-01-06 LAB — COMPREHENSIVE METABOLIC PANEL
Albumin: 4.4 g/dL (ref 3.5–5.2)
Alkaline Phosphatase: 77 U/L (ref 39–117)
BUN: 12 mg/dL (ref 6–23)
Calcium: 9.7 mg/dL (ref 8.4–10.5)
Glucose, Bld: 113 mg/dL — ABNORMAL HIGH (ref 70–99)
Potassium: 4.2 mEq/L (ref 3.5–5.1)

## 2012-01-06 LAB — LIPID PANEL
Cholesterol: 177 mg/dL (ref 0–200)
Triglycerides: 78 mg/dL (ref 0.0–149.0)
VLDL: 15.6 mg/dL (ref 0.0–40.0)

## 2012-01-06 LAB — HEPATIC FUNCTION PANEL
Alkaline Phosphatase: 77 U/L (ref 39–117)
Bilirubin, Direct: 0.1 mg/dL (ref 0.0–0.3)
Total Bilirubin: 0.8 mg/dL (ref 0.3–1.2)

## 2012-01-06 MED ORDER — GLIMEPIRIDE 4 MG PO TABS: 2.0000 mg | ORAL_TABLET | Freq: Every day | ORAL | Status: DC

## 2012-01-06 MED ORDER — SIMVASTATIN 20 MG PO TABS: 20.0000 mg | ORAL_TABLET | Freq: Every day | ORAL | Status: DC

## 2012-01-06 NOTE — Progress Notes (Signed)
Subjective:    Patient ID: Alex Massey, male    DOB: 11-Jan-1941, 71 y.o.   MRN: [id number]  HPI Alex Massey is here for annual Medicare wellness examination and management of other chronic and acute problems. Alex Massey does have some hand cramps and occasional pain in the old injured foot. Otherwise Alex Massey has been doing well.    The risk factors are reflected in the social history.  The roster of all physicians providing medical care to patient - is listed in the Snapshot section of the chart.  Activities of daily living:  The patient is 100% inedpendent in all ADLs: dressing, toileting, feeding as well as independent mobility  Home safety : The patient has smoke detectors in the home. They wear seatbelts. Fall - home is fall safe.  firearms are present in the home, kept in a safe fashion. There is no violence in the home.   There is no risks for hepatitis, STDs or HIV. There is no   history of blood transfusion. They have no travel history to infectious disease endemic areas of the world.  The patient has seen their dentist in the last six month. They have seen their eye doctor in the last year. They deny any hearing difficulty and have not had audiologic testing in the last year.  They do not  have excessive sun exposure. Discussed the need for sun protection: hats, long sleeves and use of sunscreen if there is significant sun exposure.   Diet: the importance of a healthy diet is discussed. They do have a healthy diet.  The patient has a regular exercise program: walks, 30 min duration, 5 days a week.  The benefits of regular aerobic exercise were discussed.  Depression screen: there are no signs or vegative symptoms of depression- irritability, change in appetite, anhedonia, sadness/tearfullness.  Cognitive assessment: the patient manages all their financial and personal affairs and is actively engaged.   The following portions of the patient's history were reviewed and updated as  appropriate: allergies, current medications, past family history, past medical history,  past surgical history, past social history  and problem list.  Vision, hearing, body mass index were assessed and reviewed.   During the course of the visit the patient was educated and counseled about appropriate screening and preventive services including : fall prevention , diabetes screening, nutrition counseling, colorectal cancer screening, and recommended immunizations.  Past Medical History  Diagnosis Date  . DIABETES MELLITUS, TYPE II 06/25/2007  . ERECTILE DYSFUNCTION 04/04/2008  . FOOT DROP, RIGHT 06/25/2007  . HYPERLIPIDEMIA 06/26/2007  . NEPHROLITHIASIS, HX OF 06/25/2007  . WEIGHT LOSS 09/03/2007   Past Surgical History  Procedure Date  . L-spine surgery    Family History  Problem Relation Age of Onset  . Diabetes Father   . Colon cancer Brother    History   Social History  . Marital Status: Married    Spouse Name: N/A    Number of Children: 2  . Years of Education: 14   Occupational History  .      retired   Social History Main Topics  . Smoking status: Former Smoker    Types: Cigarettes    Quit date: 07/09/2004  . Smokeless tobacco: Never Used  . Alcohol Use: 1.5 oz/week    3 drink(s) per week  . Drug Use: No  . Sexually Active: Yes -- Male partner(s)   Other Topics Concern  . Not on file   Social History Narrative   [employer]  2 years trade school. married 1965. 2 daughters- '66, '71. 2 grandchldren. 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   Current Outpatient Prescriptions on File Prior to Visit  Medication Sig Dispense Refill  . ALPRAZolam (XANAX) 0.25 MG tablet Take 0.25 mg by mouth every 6 (six) hours as needed.        Marland Kitchen CIALIS 20 MG tablet TAKE TABLET AS NEEDED  6 each  4  . FreeStyle Unistick II Lancets MISC by Does not apply route as needed.        Marland Kitchen glimepiride (AMARYL) 4 MG tablet Take 0.5 tablets (2 mg total) by mouth daily  before breakfast.  45 tablet  3  . glucose blood (FREESTYLE LITE) test strip TEST ONCE TO TWICE DAILY AS INSTRUCTED.  100 each  4  . simvastatin (ZOCOR) 20 MG tablet Take 1 tablet (20 mg total) by mouth at bedtime.  90 tablet  3       Review of Systems Constitutional:  Negative for fever, chills, activity change and unexpected weight change.  HEENT:  Negative for hearing loss, ear pain, congestion, neck stiffness and postnasal drip. Negative for sore throat or swallowing problems. Negative for dental complaints.   Eyes: Negative for vision loss or change in visual acuity.  Respiratory: Negative for chest tightness and wheezing. Negative for DOE.   Cardiovascular: Negative for chest pain or palpitations. No decreased exercise tolerance Gastrointestinal: No change in bowel habit. No bloating or gas. No reflux or indigestion Genitourinary: Negative for urgency, frequency, flank pain and difficulty urinating. ED Musculoskeletal: Negative for myalgias, back pain, arthralgias and gait problem.  Neurological: Negative for dizziness, tremors, weakness and headaches.  Hematological: Negative for adenopathy.  Psychiatric/Behavioral: Negative for behavioral problems and dysphoric mood.       Objective:   Physical Exam Filed Vitals:   01/06/12 0915  BP: 138/90  Pulse: 72  Temp: 98.3 F (36.8 C)  Resp: 16   Wt Readings from Last 3 Encounters:  01/06/12 161 lb (73.029 kg)  01/14/11 159 lb (72.122 kg)  01/02/10 159 lb (72.122 kg)    Gen'l: Well nourished well developed AA male in no acute distress  HEENT: Head: Normocephalic and atraumatic. Right Ear: External ear normal. EAC/TM nl. Left Ear: External ear normal.  EAC/TM nl. Nose: Nose normal. Mouth/Throat: Oropharynx is clear and moist. Dentition - native but missing molars and premolar mandible, in good repair. No buccal or palatal lesions. Posterior pharynx clear. Eyes: Conjunctivae and sclera clear. EOM intact. Pupils are equal, round,  and reactive to light. Right eye exhibits no discharge. Left eye exhibits no discharge. Neck: Normal range of motion. Neck supple. No JVD present. No tracheal deviation present. No thyromegaly present.  Cardiovascular: Normal rate, regular rhythm with premature beats, no gallop, no friction rub, no murmur heard.      Quiet precordium. 2+ radial and DP pulses . No carotid bruits Pulmonary/Chest: Effort normal. No respiratory distress or increased WOB, no wheezes, no rales. No chest wall deformity or CVAT. Abdominal: Soft. Bowel sounds are normal in all quadrants. Alex Massey exhibits no distension, no tenderness, no rebound or guarding, No heptosplenomegaly  Genitourinary:  deferred Musculoskeletal: Normal range of motion. Alex Massey exhibits no edema and no tenderness.       Small and large joints without redness, synovial thickening or deformity. Full range of motion preserved about all small, median and large joints.  Lymphadenopathy:    Alex Massey has no cervical or supraclavicular adenopathy.  Neurological: Alex Massey is  alert and oriented to person, place, and time. CN II-XII intact. DTRs 2+ and symmetrical biceps, radial and patellar tendons. Cerebellar function normal with no tremor, rigidity, normal gait and station. Sensation preserved to light touch and pin-prick and normal deep vibratory sensation. Skin: Skin is warm and dry. No rash noted. No erythema.  Psychiatric: Alex Massey has a normal mood and affect. His behavior is normal. Thought content normal.   Lab Results  Component Value Date   WBC 6.6 09/03/2007   HGB 14.2 09/03/2007   HCT 44.6 09/03/2007   PLT 150 09/03/2007   GLUCOSE 113* 01/06/2012   CHOL 177 01/06/2012   TRIG 78.0 01/06/2012   HDL 60.20 01/06/2012   LDLCALC 101* 01/06/2012        ALT 25 01/06/2012   AST 22 01/06/2012        NA 140 01/06/2012   K 4.2 01/06/2012   CL 105 01/06/2012   CREATININE 0.7 01/06/2012   BUN 12 01/06/2012   CO2 27 01/06/2012   TSH 0.97 01/14/2011   PSA 1.83 01/14/2011   HGBA1C 7.9* 01/06/2012          Assessment & Plan:

## 2012-01-07 ENCOUNTER — Ambulatory Visit (INDEPENDENT_AMBULATORY_CARE_PROVIDER_SITE_OTHER): Payer: Medicare Other

## 2012-01-07 DIAGNOSIS — Z23 Encounter for immunization: Secondary | ICD-10-CM

## 2012-01-07 DIAGNOSIS — Z2911 Encounter for prophylactic immunotherapy for respiratory syncytial virus (RSV): Secondary | ICD-10-CM

## 2012-01-07 NOTE — Assessment & Plan Note (Signed)
No complaints and normal exam including normal foot exam. A1C @7 .9% is not controlled.  Plan Continue glimepiride for now  Better dietary adherence - no sugar and low carb  Repeat A1C in 3 months - if still above goal will add second agent, metformin

## 2012-01-07 NOTE — Assessment & Plan Note (Signed)
Interval history is without new illness, surgery or injury. Physical exam is normal. Lab results are good except for elevated A1C. He is current with colorectal cancer screening. Series of normal PSA -Discussed pros and cons of prostate cancer screening (USPHCTF recommendations reviewed and ACU April '13 recommendations) and he has aged out for further evaluation at this time. Immunizations are current except he needs to research coverage for shingles vaccine.  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 He will continue his medications, watch his diet and be sure to exercise so that at return A1C in 3 months he will be at goal.

## 2012-01-07 NOTE — Assessment & Plan Note (Signed)
Lab reveals LDL right at goal of 100 or less.  Plan Continue zocor 20mg  and low fat diet.

## 2012-01-07 NOTE — Assessment & Plan Note (Signed)
Does get results using Cialis. May continue the same.

## 2012-01-07 NOTE — Assessment & Plan Note (Signed)
Wt Readings from Last 3 Encounters:  01/06/12 161 lb (73.029 kg)  01/14/11 159 lb (72.122 kg)  01/02/10 159 lb (72.122 kg)   Showing a minor gain in weight. Will need to be very conscientious about diet for diabetes control.

## 2012-01-08 ENCOUNTER — Other Ambulatory Visit: Payer: Self-pay | Admitting: *Deleted

## 2012-01-08 MED ORDER — GLIMEPIRIDE 4 MG PO TABS: 4.0000 mg | ORAL_TABLET | Freq: Every day | ORAL | Status: DC

## 2012-01-08 MED ORDER — SIMVASTATIN 20 MG PO TABS: 20.0000 mg | ORAL_TABLET | Freq: Every day | ORAL | Status: DC

## 2012-01-08 NOTE — Telephone Encounter (Signed)
Refills sent to [facility]

## 2012-01-13 ENCOUNTER — Encounter: Payer: Self-pay | Admitting: Internal Medicine

## 2012-01-30 ENCOUNTER — Other Ambulatory Visit: Payer: Self-pay | Admitting: *Deleted

## 2012-01-30 MED ORDER — GLIMEPIRIDE 4 MG PO TABS: 4.0000 mg | ORAL_TABLET | Freq: Every day | ORAL | Status: DC

## 2012-01-30 NOTE — Telephone Encounter (Signed)
Rx amyril sent again to [facility] (mail order)

## 2012-02-17 ENCOUNTER — Encounter: Payer: Self-pay | Admitting: Internal Medicine

## 2012-03-06 ENCOUNTER — Encounter: Payer: Self-pay | Admitting: Internal Medicine

## 2012-03-06 ENCOUNTER — Other Ambulatory Visit: Payer: Self-pay | Admitting: Internal Medicine

## 2012-03-06 ENCOUNTER — Ambulatory Visit (INDEPENDENT_AMBULATORY_CARE_PROVIDER_SITE_OTHER): Payer: Medicare Other | Admitting: Internal Medicine

## 2012-03-06 VITALS — BP 120/72 | HR 65 | Temp 98.3°F | Ht 67.0 in | Wt 162.0 lb

## 2012-03-06 DIAGNOSIS — E119 Type 2 diabetes mellitus without complications: Secondary | ICD-10-CM

## 2012-03-06 DIAGNOSIS — M542 Cervicalgia: Secondary | ICD-10-CM

## 2012-03-06 MED ORDER — MELOXICAM 15 MG PO TABS: 15.0000 mg | ORAL_TABLET | Freq: Every day | ORAL | Status: DC

## 2012-03-06 MED ORDER — CYCLOBENZAPRINE HCL 5 MG PO TABS: 5.0000 mg | ORAL_TABLET | Freq: Every evening | ORAL | Status: AC | PRN

## 2012-03-06 NOTE — Assessment & Plan Note (Signed)
Reviewed diet/exercise control and possible metformin in addition to amaryl therapy ongoing Info given regarding carb counting and diabetes mellitus diet (pt declines nutrition refer) follow up PCP as planned for recheck a1c next month Lab Results  Component Value Date   HGBA1C 7.9* 01/06/2012

## 2012-03-06 NOTE — Patient Instructions (Signed)
It was good to see you today. We have reviewed your prior records including labs and tests today Use meloxicam daily for 1 month and flexeril at night for 1 week to help neck pain Your prescription(s) have been submitted to your pharmacy. Please take as directed and contact our office if you believe you are having problem(s) with the medication(s). If neck pain is worse or unimproved in next 2-4 weeks, call for reevaluation as needed  Cervical Sprain A cervical sprain is an injury in the neck in which the ligaments are stretched or torn. The ligaments are the tissues that hold the bones of the neck (vertebrae) in place. Cervical sprains can range from very mild to very severe. Most cervical sprains get better in 1 to 3 weeks, but it depends on the cause and extent of the injury. Severe cervical sprains can cause the neck vertebrae to be unstable. This can lead to damage of the spinal cord and can result in serious nervous system problems. Your caregiver will determine whether your cervical sprain is mild or severe. CAUSES   Severe cervical sprains may be caused by:  Contact sport injuries (football, rugby, wrestling, hockey, auto racing, gymnastics, diving, martial arts, boxing).   Motor vehicle collisions.   Whiplash injuries. This means the neck is forcefully whipped backward and forward.   Falls.  Mild cervical sprains may be caused by:    Awkward positions, such as cradling a telephone between your ear and shoulder.   Sitting in a chair that does not offer proper support.   Working at a poorly Marketing executive station.   Activities that require looking up or down for long periods of time.  SYMPTOMS    Pain, soreness, stiffness, or a burning sensation in the front, back, or sides of the neck. This discomfort may develop immediately after injury or it may develop slowly and not begin for 24 hours or more after an injury.   Pain or tenderness directly in the middle of the back of the  neck.   Shoulder or upper back pain.   Limited ability to move the neck.   Headache.   Dizziness.   Weakness, numbness, or tingling in the hands or arms.   Muscle spasms.   Difficulty swallowing or chewing.   Tenderness and swelling of the neck.  DIAGNOSIS   Most of the time, your caregiver can diagnose this problem by taking your history and doing a physical exam. Your caregiver will ask about any known problems, such as arthritis in the neck or a previous neck injury. X-rays may be taken to find out if there are any other problems, such as problems with the bones of the neck. However, an X-ray often does not reveal the full extent of a cervical sprain. Other tests such as a computed tomography (CT) scan or magnetic resonance imaging (MRI) may be needed. TREATMENT   Treatment depends on the severity of the cervical sprain. Mild sprains can be treated with rest, keeping the neck in place (immobilization), and pain medicines. Severe cervical sprains need immediate immobilization and an appointment with an orthopedist or neurosurgeon. Several treatment options are available to help with pain, muscle spasms, and other symptoms. Your caregiver may prescribe:  Medicines, such as pain relievers, numbing medicines, or muscle relaxants.   Physical therapy. This can include stretching exercises, strengthening exercises, and posture training. Exercises and improved posture can help stabilize the neck, strengthen muscles, and help stop symptoms from returning.   A neck collar  to be worn for short periods of time. Often, these collars are worn for comfort. However, certain collars may be worn to protect the neck and prevent further worsening of a serious cervical sprain.  HOME CARE INSTRUCTIONS    Put ice on the injured area.   Put ice in a plastic bag.   Place a towel between your skin and the bag.   Leave the ice on for 15 to 20 minutes, 3 to 4 times a day.   Only take over-the-counter or  prescription medicines for pain, discomfort, or fever as directed by your caregiver.   Keep all follow-up appointments as directed by your caregiver.   Keep all physical therapy appointments as directed by your caregiver.   If a neck collar is prescribed, wear it as directed by your caregiver.   Do not drive while wearing a neck collar.   Make any needed adjustments to your work station to promote good posture.   Avoid positions and activities that make your symptoms worse.   Warm up and stretch before being active to help prevent problems.  SEEK MEDICAL CARE IF:    Your pain is not controlled with medicine.   You are unable to decrease your pain medicine over time as planned.   Your activity level is not improving as expected.  SEEK IMMEDIATE MEDICAL CARE IF:    You develop any bleeding, stomach upset, or signs of an allergic reaction to your medicine.   Your symptoms get worse.   You develop new, unexplained symptoms.   You have numbness, tingling, weakness, or paralysis in any part of your body.  MAKE SURE YOU:    Understand these instructions.   Will watch your condition.   Will get help right away if you are not doing well or get worse.  Document Released: 04/21/2007 Document Revised: 06/13/2011 Document Reviewed: 03/27/2011 [facility] Patient Information 2012 [place], [place].

## 2012-03-06 NOTE — Progress Notes (Signed)
  Subjective:    Patient ID: Alex Massey, male    DOB: 12/03/1940, 71 y.o.   MRN: [id number]  Neck Pain  This is a new problem. The current episode started 1 to 4 weeks ago. The problem occurs constantly. The problem has been waxing and waning. The pain is associated with a sleep position. The pain is present in the midline, left side and right side. The quality of the pain is described as aching. The pain is moderate. The symptoms are aggravated by twisting. The pain is worse during the night. Pertinent negatives include no fever, headaches, leg pain, numbness, pain with swallowing, paresis, syncope, trouble swallowing, visual change, weakness or weight loss. He has tried NSAIDs for the symptoms. The treatment provided mild relief.   Past Medical History  Diagnosis Date  . DIABETES MELLITUS, TYPE II   . ERECTILE DYSFUNCTION   . FOOT DROP, RIGHT   . HYPERLIPIDEMIA   . NEPHROLITHIASIS, HX OF      Review of Systems  Constitutional: Negative for fever and weight loss.  HENT: Positive for neck pain. Negative for trouble swallowing.   Cardiovascular: Negative for syncope.  Neurological: Negative for weakness, numbness and headaches.       Objective:   Physical Exam BP 120/72  Pulse 65  Temp 98.3 F (36.8 C) (Oral)  Ht 5\' 7"  (1.702 m)  Wt 162 lb (73.483 kg)  BMI 25.37 kg/m2  SpO2 97% Wt Readings from Last 3 Encounters:  03/06/12 162 lb (73.483 kg)  01/06/12 161 lb (73.029 kg)  01/14/11 159 lb (72.122 kg)   Constitutional: he appears well-developed and well-nourished. No distress.  HENT: Head: Normocephalic and atraumatic. Ears: B TMs ok, no erythema or effusion; Nose: Nose normal.  Mouth/Throat: Oropharynx is clear and moist. No oropharyngeal exudate.  Eyes: Conjunctivae and EOM are normal. Pupils are equal, round, and reactive to light. No scleral icterus.  Neck: spasm to  Palpation with limited roational range of motion. Neck supple without menigismus. No JVD present. No  thyromegaly present.  Cardiovascular: Normal rate, regular rhythm and normal heart sounds.  No murmur heard. No BLE edema. Pulmonary/Chest: Effort normal and breath sounds normal. No respiratory distress. he has no wheezes.  Neurological: he is alert and oriented to person, place, and time. No cranial nerve deficit. Coordination, balance and gait normal.  Skin: Skin is warm and dry. No rash noted. No erythema.  Psychiatric: he has a normal mood and affect. behavior is normal. Judgment and thought content normal.   Lab Results  Component Value Date   WBC 6.6 09/03/2007   HGB 14.2 09/03/2007   HCT 44.6 09/03/2007   PLT 150 09/03/2007   GLUCOSE 113* 01/06/2012   CHOL 177 01/06/2012   TRIG 78.0 01/06/2012   HDL 60.20 01/06/2012   LDLCALC 101* 01/06/2012   ALT 25 01/06/2012   ALT 25 01/06/2012   AST 22 01/06/2012   AST 22 01/06/2012   NA 140 01/06/2012   K 4.2 01/06/2012   CL 105 01/06/2012   CREATININE 0.7 01/06/2012   BUN 12 01/06/2012   CO2 27 01/06/2012   TSH 0.97 01/14/2011   PSA 1.83 01/14/2011   HGBA1C 7.9* 01/06/2012        Assessment & Plan:  Neck pain -  No meningismus or warning symptoms on hx/exam tx with meloxicam (mild improvement with one time Aleve use last 48h) Also low dose flexeril qhs Education provided, pt will call if worse or unimproved

## 2012-04-13 ENCOUNTER — Encounter: Payer: Self-pay | Admitting: Internal Medicine

## 2012-04-13 ENCOUNTER — Other Ambulatory Visit (INDEPENDENT_AMBULATORY_CARE_PROVIDER_SITE_OTHER): Payer: Medicare Other

## 2012-04-13 ENCOUNTER — Ambulatory Visit (INDEPENDENT_AMBULATORY_CARE_PROVIDER_SITE_OTHER): Payer: Medicare Other | Admitting: Internal Medicine

## 2012-04-13 VITALS — BP 112/80 | HR 73 | Temp 97.7°F | Resp 16 | Wt 160.0 lb

## 2012-04-13 DIAGNOSIS — Z23 Encounter for immunization: Secondary | ICD-10-CM

## 2012-04-13 DIAGNOSIS — E119 Type 2 diabetes mellitus without complications: Secondary | ICD-10-CM

## 2012-04-13 NOTE — Assessment & Plan Note (Signed)
Lab Results  Component Value Date   HGBA1C 7.9* 01/06/2012   Plan - follow-up lab, if greater than 7% will need to add metformin.

## 2012-04-13 NOTE — Progress Notes (Signed)
  Subjective:    Patient ID: Alex Massey, male    DOB: 06/09/1941, 71 y.o.   MRN: [id number]  HPI Alex Massey presents for follow-up A1C - last lab was 7.9%. He has been working on a better diet. He has no complaints and is feeling well.  PMH, FamHx and SocHx reviewed for any changes and relevance.  meds reviewed - no changes   Review of Systems System review is negative for any constitutional, cardiac, pulmonary, GI or neuro symptoms or complaints other than as described in the HPI.     Objective:   Physical Exam .vitals WNWD AA in no distress No other physical exam       Assessment & Plan:

## 2012-04-13 NOTE — Patient Instructions (Addendum)
Diabetes - you have got to stick to the diet to keep the sugar down. Will check today - if the A1C is greater than 7% will recommend we start Metformin to control the blood sugar: 1 tablet a day for the first week and then 1 tablet AM and PM. If the A1C is less than 7% then we are good.

## 2012-04-20 ENCOUNTER — Telehealth: Payer: Self-pay | Admitting: *Deleted

## 2012-04-20 NOTE — Telephone Encounter (Signed)
Message copied by Elnora Morrison on Mon Apr 20, 2012  1:56 PM ------      Message from: Jacques Navy      Created: Sun Apr 19, 2012  9:27 PM       Call patient - A1C 7.2 % better. No change in meds - keep up the good work.

## 2012-04-20 NOTE — Telephone Encounter (Signed)
patient notified of lab result of HGB A1C

## 2012-05-25 ENCOUNTER — Ambulatory Visit (INDEPENDENT_AMBULATORY_CARE_PROVIDER_SITE_OTHER): Payer: Medicare Other | Admitting: Internal Medicine

## 2012-05-25 ENCOUNTER — Encounter: Payer: Self-pay | Admitting: Internal Medicine

## 2012-05-25 ENCOUNTER — Ambulatory Visit (INDEPENDENT_AMBULATORY_CARE_PROVIDER_SITE_OTHER)
Admission: RE | Admit: 2012-05-25 | Discharge: 2012-05-25 | Disposition: A | Discharge status: Home / Self Care | Payer: Medicare Other | Source: Ambulatory Visit | Attending: Internal Medicine | Admitting: Internal Medicine

## 2012-05-25 VITALS — BP 152/82 | HR 80 | Temp 97.6°F | Resp 14 | Wt 162.8 lb

## 2012-05-25 DIAGNOSIS — M542 Cervicalgia: Secondary | ICD-10-CM

## 2012-05-25 MED ORDER — CYCLOBENZAPRINE HCL 5 MG PO TABS: 5.0000 mg | ORAL_TABLET | Freq: Three times a day (TID) | ORAL | Status: DC | PRN

## 2012-05-25 NOTE — Progress Notes (Signed)
Subjective:    Patient ID: Alex Massey, male    DOB: Apr 10, 1941, 71 y.o.   MRN: [id number]  HPI Alex Massey presents for stiffness of the neck: very hard to turn his head. He was treated for cervical strain in August '13 with flexeril and meloxicam. He has had no paresthesia or weakness in arms or hands. He does have carpal tunner - left hand.  Past Medical History  Diagnosis Date  . DIABETES MELLITUS, TYPE II   . ERECTILE DYSFUNCTION   . FOOT DROP, RIGHT   . HYPERLIPIDEMIA   . NEPHROLITHIASIS, HX OF    Past Surgical History  Procedure Date  . L-spine surgery 2000   Family History  Problem Relation Age of Onset  . Diabetes Father   . Colon cancer Brother    History   Social History  . Marital Status: Married    Spouse Name: N/A    Number of Children: 2  . Years of Education: 14   Occupational History  .      retired   Social History Main Topics  . Smoking status: Former Smoker    Types: Cigarettes    Quit date: 07/09/2004  . Smokeless tobacco: Never Used  . Alcohol Use: 1.5 oz/week    3 drink(s) per week  . Drug Use: No  . Sexually Active: Yes -- Male partner(s)   Other Topics Concern  . Not on file   Social History Narrative   HS[employer] 2 years trade school. married 1965. 2 daughters- '66, '71. 2 grandchldren. 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    Current Outpatient Prescriptions on File Prior to Visit  Medication Sig Dispense Refill  . ALPRAZolam (XANAX) 0.25 MG tablet Take 0.25 mg by mouth every 6 (six) hours as needed.        . B Complex Vitamins (VITAMIN-B COMPLEX) TABS Take by mouth 2 (two) times daily.      Marland Kitchen CIALIS 20 MG tablet TAKE TABLET AS NEEDED  6 each  4  . cyclobenzaprine (FLEXERIL) 5 MG tablet Take 1 tablet (5 mg total) by mouth at bedtime as needed for muscle spasms.  30 tablet  0  . FreeStyle Unistick II Lancets MISC by Does not apply route daily.       Marland Kitchen glimepiride (AMARYL) 4 MG tablet Take 1 tablet (4 mg  total) by mouth daily before breakfast.  90 tablet  3  . glucose blood test strip 1 each by Other route daily.      . meloxicam (MOBIC) 15 MG tablet TAKE ONE TABLET BY MOUTH ONE TIME DAILY  30 tablet  1  . simvastatin (ZOCOR) 20 MG tablet Take 1 tablet (20 mg total) by mouth at bedtime.  90 tablet  3      Review of Systems System review is negative for any constitutional, cardiac, pulmonary, GI or neuro symptoms or complaints other than as described in the HPI.     Objective:   Physical Exam Filed Vitals:   05/25/12 1124  BP: 152/82  Pulse: 80  Temp: 97.6 F (36.4 C)  Resp: 14   Gen'l- WNWD AA man in no distress Neck - 90% normal ROM actively with greatest limitation in rotation Cor- RRR PUlm - normal respirations Neuro - normal grip strength, normal DTRs, full ROM       Assessment & Plan:  Neck pain - concern for possible arthritic changes in the cervical spine as cause of pain vs strain.  Plan c-spine series to r/o arthritic   Flexeril 5 mg every 6 hrs as needed (generic)  Over the Counter Aleve taking 1or 2 tablets twice a day  Addendum: C-spine series: Comparison: None.  Findings: Straightening of cervical lordosis. Prevertebral soft  tissue contours are within normal limits. Lower cervical spine  degenerative endplate changes but relatively preserved disc spaces.  Bilateral posterior element alignment is within normal limits. AP  alignment and lung apices within normal limits. Facet hypertrophy  maximal on the right at C3-C4. C1-C2 alignment and odontoid within  normal limits.  IMPRESSION:  No acute osseous abnormality in the cervical spine.

## 2012-05-25 NOTE — Patient Instructions (Addendum)
Neck pain - concern for possible arthritic changes in the cervical spine as cause of pain vs strain.  Plan c-spine series to r/o arthritic   Flexeril 5 mg every 6 hrs as needed (generic)  Over the Counter Aleve taking 1or 2 tablets twice a day

## 2012-05-28 ENCOUNTER — Encounter: Payer: Self-pay | Admitting: Internal Medicine

## 2012-06-08 ENCOUNTER — Other Ambulatory Visit: Payer: Self-pay | Admitting: Internal Medicine

## 2012-09-04 ENCOUNTER — Other Ambulatory Visit: Payer: Self-pay | Admitting: *Deleted

## 2012-09-04 MED ORDER — ALPRAZOLAM 0.25 MG PO TABS: 0.2500 mg | ORAL_TABLET | Freq: Four times a day (QID) | ORAL | Status: DC | PRN

## 2012-09-09 ENCOUNTER — Other Ambulatory Visit: Payer: Self-pay | Admitting: Internal Medicine

## 2012-09-09 MED ORDER — ALPRAZOLAM 0.25 MG PO TABS: 0.2500 mg | ORAL_TABLET | Freq: Four times a day (QID) | ORAL | Status: DC | PRN

## 2012-09-09 NOTE — Telephone Encounter (Signed)
Read phone note below and advise.

## 2012-09-09 NOTE — Telephone Encounter (Signed)
Alex Massey from [facility] is calling because the patient came to pick up his alprazolam and they do not have a record of [name] sending a refill, requesting it to be called in to [phone] if refill is OK

## 2012-10-21 ENCOUNTER — Other Ambulatory Visit: Payer: Self-pay

## 2012-10-21 ENCOUNTER — Other Ambulatory Visit: Payer: Self-pay | Admitting: Internal Medicine

## 2012-10-21 MED ORDER — FREESTYLE UNISTICK II LANCETS MISC: Status: DC

## 2012-12-21 ENCOUNTER — Other Ambulatory Visit: Payer: Self-pay

## 2012-12-21 MED ORDER — SIMVASTATIN 20 MG PO TABS: 20.0000 mg | ORAL_TABLET | Freq: Every day | ORAL | Status: DC

## 2013-02-03 ENCOUNTER — Other Ambulatory Visit (INDEPENDENT_AMBULATORY_CARE_PROVIDER_SITE_OTHER): Payer: Medicare Other

## 2013-02-03 ENCOUNTER — Ambulatory Visit (INDEPENDENT_AMBULATORY_CARE_PROVIDER_SITE_OTHER): Payer: Medicare Other | Admitting: Internal Medicine

## 2013-02-03 ENCOUNTER — Encounter: Payer: Self-pay | Admitting: Internal Medicine

## 2013-02-03 VITALS — BP 138/80 | HR 84 | Temp 97.6°F | Ht 67.0 in | Wt 160.0 lb

## 2013-02-03 DIAGNOSIS — E785 Hyperlipidemia, unspecified: Secondary | ICD-10-CM

## 2013-02-03 DIAGNOSIS — E119 Type 2 diabetes mellitus without complications: Secondary | ICD-10-CM

## 2013-02-03 DIAGNOSIS — M216X9 Other acquired deformities of unspecified foot: Secondary | ICD-10-CM

## 2013-02-03 DIAGNOSIS — Z0000 Encounter for general adult medical examination without abnormal findings: Secondary | ICD-10-CM

## 2013-02-03 LAB — LIPID PANEL
Cholesterol: 151 mg/dL (ref 0–200)
HDL: 47.4 mg/dL (ref >39.00)
Triglycerides: 144 mg/dL (ref 0.0–149.0)
VLDL: 28.8 mg/dL (ref 0.0–40.0)

## 2013-02-03 LAB — HEPATIC FUNCTION PANEL
Albumin: 4.3 g/dL (ref 3.5–5.2)
Bilirubin, Direct: 0.2 mg/dL (ref 0.0–0.3)
Total Protein: 7.2 g/dL (ref 6.0–8.3)

## 2013-02-03 LAB — COMPREHENSIVE METABOLIC PANEL
ALT: 22 U/L (ref 0–53)
BUN: 12 mg/dL (ref 6–23)
CO2: 28 mEq/L (ref 19–32)
Calcium: 9.9 mg/dL (ref 8.4–10.5)
Chloride: 104 mEq/L (ref 96–112)
Creatinine, Ser: 0.8 mg/dL (ref 0.4–1.5)
GFR: 124.33 mL/min (ref >60.00)
Sodium: 139 mEq/L (ref 135–145)
Total Bilirubin: 0.8 mg/dL (ref 0.3–1.2)

## 2013-02-03 LAB — MICROALBUMIN / CREATININE URINE RATIO: Microalb, Ur: 3.9 mg/dL — ABNORMAL HIGH (ref 0.0–1.9)

## 2013-02-03 NOTE — Progress Notes (Signed)
Subjective:    Patient ID: Alex Massey, male    DOB: 19-Dec-1940, 72 y.o.   MRN: [id number]  HPI The patient is here for annual Medicare wellness examination and management of other chronic and acute problems.  Reviewed labs back to July '13: A1C 7.9% to 7.2 % October '13. He reports that his CBGs have been tracking.  He has had a good year with no major medical problems.   The risk factors are reflected in the social history.  The roster of all physicians providing medical care to patient - is listed in the Snapshot section of the chart.  Activities of daily living:  The patient is 100% inedpendent in all ADLs: dressing, toileting, feeding as well as independent mobility  Home safety : The patient has smoke detectors in the home. They wear seatbelts. No firearms at home. There is no violence in the home.   There is no risks for hepatitis, STDs or HIV. There is no history of blood transfusion. They have no travel history to infectious disease endemic areas of the world.  The patient has seen their dentist in the last six month. They have seen their eye doctor in the last year - Dr. Eulah Pont. They deny any hearing difficulty and have not had audiologic testing in the last year.    They do not  have excessive sun exposure. Discussed the need for sun protection: hats, long sleeves and use of sunscreen if there is significant sun exposure.   Diet: the importance of a healthy diet is discussed. They do have a healthy diet.  The patient has a regular exercise program: walks , 60 min duration, 6 per week.  The benefits of regular aerobic exercise were discussed.  Depression screen: there are no signs or vegative symptoms of depression- irritability, change in appetite, anhedonia, sadness/tearfullness.  Cognitive assessment: the patient manages all their financial and personal affairs and is actively engaged.   The following portions of the patient's history were reviewed and updated as  appropriate: allergies, current medications, past family history, past medical history,  past surgical history, past social history  and problem list.  Vision, hearing, body mass index were assessed and reviewed.   During the course of the visit the patient was educated and counseled about appropriate screening and preventive services including : fall prevention , diabetes screening, nutrition counseling, colorectal cancer screening, and recommended immunizations.  Past Medical History  Diagnosis Date  . DIABETES MELLITUS, TYPE II   . ERECTILE DYSFUNCTION   . FOOT DROP, RIGHT   . HYPERLIPIDEMIA   . NEPHROLITHIASIS, HX OF    Past Surgical History  Procedure Laterality Date  . L-spine surgery  2000   Family History  Problem Relation Age of Onset  . Diabetes Father   . Colon cancer Brother    History   Social History  . Marital Status: Married    Spouse Name: N/A    Number of Children: 2  . Years of Education: 14   Occupational History  .      retired   Social History Main Topics  . Smoking status: Former Smoker    Types: Cigarettes    Quit date: 07/09/2004  . Smokeless tobacco: Never Used  . Alcohol Use: 1.5 oz/week    3 drink(s) per week  . Drug Use: No  . Sexually Active: Yes -- Male partner(s)   Other Topics Concern  . Not on file   Social History Narrative   HSG 2  years trade school. married 1965. 2 daughters- '66, '71. 2 grandchldren. 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    Current Outpatient Prescriptions on File Prior to Visit  Medication Sig Dispense Refill  . ALPRAZolam (XANAX) 0.25 MG tablet Take 1 tablet (0.25 mg total) by mouth every 6 (six) hours as needed.  30 tablet  5  . B Complex Vitamins (VITAMIN-B COMPLEX) TABS Take by mouth 2 (two) times daily.      Marland Kitchen CIALIS 20 MG tablet TAKE ONE TABLET BY MOUTH AS NEEDED  6 tablet  3  . cyclobenzaprine (FLEXERIL) 5 MG tablet Take 1 tablet (5 mg total) by mouth at bedtime as  needed for muscle spasms.  30 tablet  0  . cyclobenzaprine (FLEXERIL) 5 MG tablet Take 1 tablet (5 mg total) by mouth 3 (three) times daily as needed for muscle spasms.  30 tablet  1  . FREESTYLE TEST STRIPS test strip TEST ONCE TO TWICE DAILY AS INSTRUCTED.  100 each  3  . FreeStyle Unistick II Lancets MISC Pt to test blood sugar twice daily. Dx: 250.00  100 each  3  . glimepiride (AMARYL) 4 MG tablet Take 1 tablet (4 mg total) by mouth daily before breakfast.  90 tablet  3  . glucose blood test strip 1 each by Other route daily.      . meloxicam (MOBIC) 15 MG tablet TAKE ONE TABLET BY MOUTH ONE TIME DAILY  30 tablet  1  . simvastatin (ZOCOR) 20 MG tablet Take 1 tablet (20 mg total) by mouth at bedtime.  90 tablet  0   No current facility-administered medications on file prior to visit.       Review of Systems Constitutional:  Negative for fever, chills, activity change and unexpected weight change.  HEENT:  Negative for hearing loss, ear pain, congestion, neck stiffness and postnasal drip. Negative for sore throat or swallowing problems. Negative for dental complaints.   Eyes: Negative for vision loss or change in visual acuity.  Respiratory: Negative for chest tightness and wheezing. Negative for DOE.   Cardiovascular: Negative for chest pain or palpitations. No decreased exercise tolerance Gastrointestinal: No change in bowel habit. No bloating or gas. No reflux or indigestion Genitourinary: Negative for urgency, frequency, flank pain and difficulty urinating.  Musculoskeletal: Negative for myalgias, back pain, arthralgias and gait problem.  Neurological: Negative for dizziness, tremors, weakness and headaches.  Hematological: Negative for adenopathy.  Psychiatric/Behavioral: Negative for behavioral problems and dysphoric mood.       Objective:   Physical Exam Filed Vitals:   02/03/13 0851  BP: 138/80  Pulse: 84  Temp: 97.6 F (36.4 C)   Wt Readings from Last 3 Encounters:   02/03/13 160 lb (72.576 kg)  05/25/12 162 lb 12 oz (73.823 kg)  04/13/12 160 lb (72.576 kg)   Gen'l: Well nourished well developed AA male in no acute distress  HEENT: Head: Normocephalic and atraumatic. Right Ear: External ear normal. EAC/TM nl. Left Ear: External ear normal.  EAC/TM nl. Nose: Nose normal. Mouth/Throat: Oropharynx is clear and moist. Dentition - native, in good repair. No buccal or palatal lesions. Posterior pharynx clear. Eyes: Conjunctivae and sclera clear. EOM intact. Pupils are equal, round, and reactive to light. Right eye exhibits no discharge. Left eye exhibits no discharge. Neck: Normal range of motion. Neck supple. No JVD present. No tracheal deviation present. No thyromegaly present.  Cardiovascular: Normal rate, regular rhythm, no gallop, no friction rub, no murmur heard.  Quiet precordium. 2+ radial and DP pulses . No carotid bruits Pulmonary/Chest: Effort normal. No respiratory distress or increased WOB, no wheezes, no rales. No chest wall deformity or CVAT. Abdomen: Soft. Bowel sounds are normal in all quadrants. He exhibits no distension, no tenderness, no rebound or guarding, No heptosplenomegaly  Genitourinary:  deferred Musculoskeletal: Normal range of motion. He exhibits no edema and no tenderness.       Small and large joints without redness, synovial thickening or deformity. Full range of motion preserved about all small, median and large joints.  Lymphadenopathy:    He has no cervical or supraclavicular adenopathy.  Neurological: He is alert and oriented to person, place, and time. CN II-XII intact. DTRs 2+ and symmetrical biceps, radial and patellar tendons. Cerebellar function normal with no tremor, rigidity, normal gait and station. see foot exam. Skin: Skin is warm and dry. No rash noted. No erythema.  Psychiatric: He has a normal mood and affect. His behavior is normal. Thought content normal.   Recent Results (from the past 2160 hour(s))   HEMOGLOBIN A1C     Status: Abnormal   Collection Time    02/03/13  9:54 AM      Result Value Range   Hemoglobin A1C 7.8 (*) 4.6 - 6.5 %   Comment: Glycemic Control Guidelines for People with Diabetes:Non Diabetic:  <6%Goal of Therapy: <7%Additional Action Suggested:  >8%   HEPATIC FUNCTION PANEL     Status: None   Collection Time    02/03/13  9:54 AM      Result Value Range   Total Bilirubin 0.8  0.3 - 1.2 mg/dL   Bilirubin, Direct 0.2  0.0 - 0.3 mg/dL   Alkaline Phosphatase 84  39 - 117 U/L   AST 16  0 - 37 U/L   ALT 22  0 - 53 U/L   Total Protein 7.2  6.0 - 8.3 g/dL   Albumin 4.3  3.5 - 5.2 g/dL  TSH     Status: None   Collection Time    02/03/13  9:54 AM      Result Value Range   TSH 1.10  0.35 - 5.50 uIU/mL  COMPREHENSIVE METABOLIC PANEL     Status: Abnormal   Collection Time    02/03/13  9:54 AM      Result Value Range   Sodium 139  135 - 145 mEq/L   Potassium 4.3  3.5 - 5.1 mEq/L   Chloride 104  96 - 112 mEq/L   CO2 28  19 - 32 mEq/L   Glucose, Bld 145 (*) 70 - 99 mg/dL   BUN 12  6 - 23 mg/dL   Creatinine, Ser 0.8  0.4 - 1.5 mg/dL   Total Bilirubin 0.8  0.3 - 1.2 mg/dL   Alkaline Phosphatase 84  39 - 117 U/L   AST 16  0 - 37 U/L   ALT 22  0 - 53 U/L   Total Protein 7.2  6.0 - 8.3 g/dL   Albumin 4.3  3.5 - 5.2 g/dL   Calcium 9.9  8.4 - 86.5 mg/dL   GFR 784.69  >62.95 mL/min  LIPID PANEL     Status: None   Collection Time    02/03/13  9:54 AM      Result Value Range   Cholesterol 151  0 - 200 mg/dL   Comment: ATP III Classification       Desirable:  < 200 mg/dL  Borderline High:  200 - 239 mg/dL          High:  > = 782 mg/dL   Triglycerides 956.2  0.0 - 149.0 mg/dL   Comment: Normal:  <130 mg/dLBorderline High:  150 - 199 mg/dL   HDL 86.57  >84.69 mg/dL   VLDL 62.9  0.0 - 52.8 mg/dL   LDL Cholesterol 75  0 - 99 mg/dL   Total CHOL/HDL Ratio 3     Comment:                Men          Women1/2 Average Risk     3.4          3.3Average Risk           5.0          4.42X Average Risk          9.6          7.13X Average Risk          15.0          11.0                      MICROALBUMIN / CREATININE URINE RATIO     Status: Abnormal   Collection Time    02/03/13  9:54 AM      Result Value Range   Microalb, Ur 3.9 (*) 0.0 - 1.9 mg/dL   Creatinine,U 413.2     Microalb Creat Ratio 2.0  0.0 - 30.0 mg/g          Assessment & Plan:

## 2013-02-03 NOTE — Patient Instructions (Addendum)
Very good to see you.  Your exam is normal. Your foot exam shows mild loss of sensation but no callus, no ulcer, good circulation. You do not qualify for Medicare covered shoes or inserts.  You are up to date for colorectal cancer screening; all you immunizations are current.  For lab today. Results will be available on MyChart  For future reference: it is ok to have a light breakfast along with you medication on the morning of your exam.  Keep up the healthy life-style: diet, bedtime snack and exercise where you get your heart rate to 120-130 and break a good sweat.

## 2013-02-04 NOTE — Assessment & Plan Note (Signed)
Taking and tolerating medication. Lipid panel reveals good control with LDL better than goal of 100 or less. Liver functions are normal.   Plan Continue present regimen

## 2013-02-04 NOTE — Assessment & Plan Note (Signed)
A1C returns at 7.9%  Plan Increase Amaryl (glimepiride) to 4 mg daily  Continue to watch diet and to exercise  Follow up lab in 3 months.

## 2013-02-04 NOTE — Assessment & Plan Note (Signed)
Interval history is notable for minor weight gain and decreased control of DM. Physical exam is normal. Lab results, except A1C, are in normal range. He is current with colorectal and prostate cancer screening. Immunizations are up to date.   In summary  A very nice man who is medically stable but needs better control of diabetes. He will return in 3 months for follow up lab and brief visit.

## 2013-02-04 NOTE — Assessment & Plan Note (Signed)
He accomodates very well and has not restrictions in his activities. He must take care when walking or jogging. He does not qualify for diabetic shoes.

## 2013-02-10 ENCOUNTER — Other Ambulatory Visit: Payer: Self-pay

## 2013-02-23 ENCOUNTER — Other Ambulatory Visit: Payer: Self-pay

## 2013-02-23 MED ORDER — GLIMEPIRIDE 4 MG PO TABS: 4.0000 mg | ORAL_TABLET | Freq: Every day | ORAL | Status: DC

## 2013-05-13 ENCOUNTER — Ambulatory Visit (INDEPENDENT_AMBULATORY_CARE_PROVIDER_SITE_OTHER): Payer: Medicare Other | Admitting: Internal Medicine

## 2013-05-13 ENCOUNTER — Other Ambulatory Visit (INDEPENDENT_AMBULATORY_CARE_PROVIDER_SITE_OTHER): Payer: Medicare Other

## 2013-05-13 ENCOUNTER — Encounter: Payer: Self-pay | Admitting: Internal Medicine

## 2013-05-13 ENCOUNTER — Other Ambulatory Visit: Payer: Self-pay

## 2013-05-13 VITALS — BP 144/90 | HR 69 | Temp 98.0°F | Wt 163.0 lb

## 2013-05-13 DIAGNOSIS — Z23 Encounter for immunization: Secondary | ICD-10-CM

## 2013-05-13 DIAGNOSIS — E119 Type 2 diabetes mellitus without complications: Secondary | ICD-10-CM

## 2013-05-13 LAB — COMPREHENSIVE METABOLIC PANEL
AST: 18 U/L (ref 0–37)
Albumin: 4.3 g/dL (ref 3.5–5.2)
Alkaline Phosphatase: 77 U/L (ref 39–117)
GFR: 120.7 mL/min (ref >60.00)
Potassium: 4 mEq/L (ref 3.5–5.1)
Sodium: 138 mEq/L (ref 135–145)
Total Protein: 7.2 g/dL (ref 6.0–8.3)

## 2013-05-13 MED ORDER — SIMVASTATIN 20 MG PO TABS: 20.0000 mg | ORAL_TABLET | Freq: Every day | ORAL | Status: DC

## 2013-05-13 MED ORDER — GLIMEPIRIDE 4 MG PO TABS: 4.0000 mg | ORAL_TABLET | Freq: Every day | ORAL | Status: DC

## 2013-05-13 NOTE — Patient Instructions (Signed)
Diabetes - it is a natural progression of the disease for the blood sugar to rise even wiyth no change in diet or exercise: progressive loss of function of the islet cells in the pancreas that make insulin.  Plan Recheck A1C  If still elevated will need to consider changing medication - first step will be to stop the amaryl and try metformin. 

## 2013-05-13 NOTE — Progress Notes (Signed)
  Subjective:    Patient ID: Alex Massey, male    DOB: 12-15-1940, 72 y.o.   MRN: [id number]  HPI Mr. Soderlund presents for follow up of diabetes - he has been trending higher.  He has otherwise been doing OK.   Past Medical History  Diagnosis Date  . DIABETES MELLITUS, TYPE II   . ERECTILE DYSFUNCTION   . FOOT DROP, RIGHT   . HYPERLIPIDEMIA   . NEPHROLITHIASIS, HX OF    Past Surgical History  Procedure Laterality Date  . L-spine surgery  2000   Family History  Problem Relation Age of Onset  . Diabetes Father   . Colon cancer Brother    History   Social History  . Marital Status: Married    Spouse Name: N/A    Number of Children: 2  . Years of Education: 14   Occupational History  .      retired   Social History Main Topics  . Smoking status: Former Smoker    Types: Cigarettes    Quit date: 07/09/2004  . Smokeless tobacco: Never Used  . Alcohol Use: 1.5 oz/week    3 drink(s) per week  . Drug Use: No  . Sexual Activity: Yes    Partners: Female   Other Topics Concern  . Not on file   Social History Narrative   HSG 2 years trade school. married 1965. 2 daughters- '66, '71. 2 grandchldren. 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   Current Outpatient Prescriptions on File Prior to Visit  Medication Sig Dispense Refill  . ALPRAZolam (XANAX) 0.25 MG tablet Take 1 tablet (0.25 mg total) by mouth every 6 (six) hours as needed.  30 tablet  5  . B Complex Vitamins (VITAMIN-B COMPLEX) TABS Take by mouth 2 (two) times daily.      Marland Kitchen CIALIS 20 MG tablet TAKE ONE TABLET BY MOUTH AS NEEDED  6 tablet  3  . cyclobenzaprine (FLEXERIL) 5 MG tablet Take 1 tablet (5 mg total) by mouth 3 (three) times daily as needed for muscle spasms.  30 tablet  1  . FREESTYLE TEST STRIPS test strip TEST ONCE TO TWICE DAILY AS INSTRUCTED.  100 each  3  . FreeStyle Unistick II Lancets MISC Pt to test blood sugar twice daily. Dx: 250.00  100 each  3  . glucose blood  test strip 1 each by Other route daily.      . meloxicam (MOBIC) 15 MG tablet TAKE ONE TABLET BY MOUTH ONE TIME DAILY  30 tablet  1   No current facility-administered medications on file prior to visit.      Review of Systems System review is negative for any constitutional, cardiac, pulmonary, GI or neuro symptoms or complaints other than as described in the HPI.     Objective:   Physical Exam Filed Vitals:   05/13/13 0933  BP: 144/90  Pulse: 69  Temp: 98 F (36.7 C)   Wt Readings from Last 3 Encounters:  05/13/13 163 lb (73.936 kg)  02/03/13 160 lb (72.576 kg)  05/25/12 162 lb 12 oz (73.823 kg)   Gen'l- WNWD man in no distress HEENT_ C&S clear Cor 2+ radial pulse, regular Pulm - CTAP Neuro - A&O x 3       Assessment & Plan:

## 2013-05-13 NOTE — Assessment & Plan Note (Signed)
Diabetes - it is a natural progression of the disease for the blood sugar to rise even wiyth no change in diet or exercise: progressive loss of function of the islet cells in the pancreas that make insulin.  Plan Recheck A1C  If still elevated will need to consider changing medication - first step will be to stop the amaryl and try metformin.

## 2013-05-13 NOTE — Progress Notes (Signed)
Pre visit review using our clinic review tool, if applicable. No additional management support is needed unless otherwise documented below in the visit note. 

## 2013-05-14 ENCOUNTER — Other Ambulatory Visit: Payer: Self-pay | Admitting: Internal Medicine

## 2013-05-14 DIAGNOSIS — E119 Type 2 diabetes mellitus without complications: Secondary | ICD-10-CM

## 2013-05-14 MED ORDER — METFORMIN HCL 500 MG PO TABS: 500.0000 mg | ORAL_TABLET | Freq: Two times a day (BID) | ORAL | Status: DC

## 2013-06-09 ENCOUNTER — Telehealth: Payer: Self-pay | Admitting: *Deleted

## 2013-06-09 NOTE — Telephone Encounter (Signed)
Spoke with pts wife advised of MDs message. 

## 2013-06-09 NOTE — Telephone Encounter (Signed)
Should continue the metformin - it does not cause a rise in blood sugar. Would like to see CBG readings.

## 2013-06-09 NOTE — Telephone Encounter (Signed)
Pt called states while on Metformin his Blood Sugar continues to increase.  Please advise

## 2013-06-30 ENCOUNTER — Ambulatory Visit (INDEPENDENT_AMBULATORY_CARE_PROVIDER_SITE_OTHER): Payer: Medicare Other | Admitting: Internal Medicine

## 2013-06-30 ENCOUNTER — Encounter: Payer: Self-pay | Admitting: Internal Medicine

## 2013-06-30 VITALS — BP 136/82 | HR 82 | Temp 97.0°F | Wt 163.0 lb

## 2013-06-30 DIAGNOSIS — E119 Type 2 diabetes mellitus without complications: Secondary | ICD-10-CM

## 2013-06-30 MED ORDER — METFORMIN HCL 1000 MG PO TABS: 1000.0000 mg | ORAL_TABLET | Freq: Two times a day (BID) | ORAL | Status: DC

## 2013-06-30 NOTE — Progress Notes (Signed)
   Subjective:    Patient ID: Alex Massey, male    DOB: 03-27-1941, 72 y.o.   MRN: [id number]  HPI Mr. Odoherty presents for f/u diabetes.  Lab Results  Component Value Date   HGBA1C 7.6* 05/13/2013   Current medication - metformin 500 mg bid.  Home CBG readings running in the 200's but he feels well.  PMH, FamHx and SocHx reviewed for any changes and relevance. Current Outpatient Prescriptions on File Prior to Visit  Medication Sig Dispense Refill  . ALPRAZolam (XANAX) 0.25 MG tablet Take 1 tablet (0.25 mg total) by mouth every 6 (six) hours as needed.  30 tablet  5  . B Complex Vitamins (VITAMIN-B COMPLEX) TABS Take by mouth 2 (two) times daily.      Marland Kitchen CIALIS 20 MG tablet TAKE ONE TABLET BY MOUTH AS NEEDED  6 tablet  3  . cyclobenzaprine (FLEXERIL) 5 MG tablet Take 1 tablet (5 mg total) by mouth 3 (three) times daily as needed for muscle spasms.  30 tablet  1  . FREESTYLE TEST STRIPS test strip TEST ONCE TO TWICE DAILY AS INSTRUCTED.  100 each  3  . FreeStyle Unistick II Lancets MISC Pt to test blood sugar twice daily. Dx: 250.00  100 each  3  . glucose blood test strip 1 each by Other route daily.      . meloxicam (MOBIC) 15 MG tablet TAKE ONE TABLET BY MOUTH ONE TIME DAILY  30 tablet  1  . metFORMIN (GLUCOPHAGE) 500 MG tablet Take 1 tablet (500 mg total) by mouth 2 (two) times daily with a meal.  60 tablet  11  . simvastatin (ZOCOR) 20 MG tablet Take 1 tablet (20 mg total) by mouth at bedtime.  90 tablet  3   No current facility-administered medications on file prior to visit.    Review of Systems System review is negative for any constitutional, cardiac, pulmonary, GI or neuro symptoms or complaints other than as described in the HPI.     Objective:   Physical Exam Filed Vitals:   06/30/13 0919  BP: 136/82  Pulse: 82  Temp: 97 F (36.1 C)   Gen'l- WNWD man in no distress Cor- 2+ radial, RRR Pulm - normal respirations       Assessment & Plan:

## 2013-06-30 NOTE — Assessment & Plan Note (Signed)
Lab Results  Component Value Date   HGBA1C 7.6* 05/13/2013   Alex Massey has been tolerating the metformin but control is suboptimal  Plan Increase metformin to 1,000 mg bid  If CBGs continue to run high will add DPP4

## 2013-06-30 NOTE — Patient Instructions (Signed)
Diabetes - blood sugars remain too high.  Plan Increase metformin to 1,000mg  twice a day  Monitor your blood sugars and let me know the readings. If you still run too high will be adding either [place] or tradjenta.  Have Merry Christmas.

## 2013-06-30 NOTE — Progress Notes (Signed)
Pre visit review using our clinic review tool, if applicable. No additional management support is needed unless otherwise documented below in the visit note. 

## 2013-07-12 ENCOUNTER — Telehealth: Payer: Self-pay

## 2013-07-12 MED ORDER — METFORMIN HCL 1000 MG PO TABS: 1000.0000 mg | ORAL_TABLET | Freq: Two times a day (BID) | ORAL | Status: DC

## 2013-07-12 MED ORDER — METFORMIN HCL 1000 MG PO TABS
1000.0000 mg | ORAL_TABLET | Freq: Two times a day (BID) | ORAL | Status: DC
Start: 2013-07-12 — End: 2014-02-09

## 2013-07-12 NOTE — Telephone Encounter (Signed)
Reviewed my note and spoke with the patient. His metformin was increased to 1,000 mg twice a day. I did make a type to [facility]  Putting in #[phone].  He needs new Rx to [facility] for metformin 1,000mg  bid, #180 w/ 3 refills.  He needs a one month supply called in to [facility] on [address], [address].  Thanks

## 2013-07-12 NOTE — Telephone Encounter (Signed)
Phone call from patient, he states he was seen Christmas eve and was started on Mirtazapine 15 mg ( he is not sure on directions) and this was sent to [facility]. I do not see this on his med list. He states he will run out today and was needing a supply sent to [facility] on [address]. Please advise.

## 2013-07-12 NOTE — Telephone Encounter (Signed)
rx sent

## 2013-07-12 NOTE — Telephone Encounter (Signed)
The patient is hoping to get a call back from the [facility] regarding his medications. He states he has some questions about what he should be taking.   Thanks!

## 2013-07-15 ENCOUNTER — Other Ambulatory Visit: Payer: Self-pay

## 2013-07-15 MED ORDER — MIRTAZAPINE 15 MG PO TABS: 15.0000 mg | ORAL_TABLET | Freq: Every day | ORAL | Status: DC

## 2013-08-22 ENCOUNTER — Other Ambulatory Visit: Payer: Self-pay | Admitting: Internal Medicine

## 2013-08-25 ENCOUNTER — Other Ambulatory Visit: Payer: Self-pay | Admitting: Internal Medicine

## 2013-08-25 ENCOUNTER — Telehealth: Payer: Self-pay | Admitting: Internal Medicine

## 2013-08-25 NOTE — Telephone Encounter (Signed)
I called and spoke to the pharmacy. They have the prescription and patient has already picked it up.

## 2013-08-25 NOTE — Telephone Encounter (Signed)
Patient called and states that the pharmacy told him that they did not receive the phone-in request from our office for his CIALIS 20 MG tablet. Please advise.

## 2013-11-23 ENCOUNTER — Ambulatory Visit (INDEPENDENT_AMBULATORY_CARE_PROVIDER_SITE_OTHER): Payer: Medicare Other | Admitting: Internal Medicine

## 2013-11-23 ENCOUNTER — Other Ambulatory Visit (INDEPENDENT_AMBULATORY_CARE_PROVIDER_SITE_OTHER): Payer: Medicare Other

## 2013-11-23 ENCOUNTER — Encounter: Payer: Self-pay | Admitting: Internal Medicine

## 2013-11-23 VITALS — BP 136/86 | HR 73 | Temp 97.9°F | Wt 153.8 lb

## 2013-11-23 DIAGNOSIS — R198 Other specified symptoms and signs involving the digestive system and abdomen: Secondary | ICD-10-CM

## 2013-11-23 DIAGNOSIS — E119 Type 2 diabetes mellitus without complications: Secondary | ICD-10-CM

## 2013-11-23 DIAGNOSIS — R194 Change in bowel habit: Secondary | ICD-10-CM

## 2013-11-23 LAB — MICROALBUMIN / CREATININE URINE RATIO
Creatinine,U: 210.4 mg/dL
MICROALB UR: 0.7 mg/dL (ref 0.0–1.9)
MICROALB/CREAT RATIO: 0.3 mg/g (ref 0.0–30.0)

## 2013-11-23 LAB — CBC WITH DIFFERENTIAL/PLATELET
Basophils Absolute: 0 10*3/uL (ref 0.0–0.1)
Basophils Relative: 0.6 % (ref 0.0–3.0)
Eosinophils Absolute: 0.2 10*3/uL (ref 0.0–0.7)
Eosinophils Relative: 3 % (ref 0.0–5.0)
HCT: 40.3 % (ref 39.0–52.0)
Hemoglobin: 12.9 g/dL — ABNORMAL LOW (ref 13.0–17.0)
LYMPHS PCT: 41.5 % (ref 12.0–46.0)
Lymphs Abs: 2.5 10*3/uL (ref 0.7–4.0)
MCHC: 32 g/dL (ref 30.0–36.0)
MCV: 80.3 fl (ref 78.0–100.0)
MONOS PCT: 9.1 % (ref 3.0–12.0)
Monocytes Absolute: 0.6 10*3/uL (ref 0.1–1.0)
NEUTROS PCT: 45.8 % (ref 43.0–77.0)
Neutro Abs: 2.8 10*3/uL (ref 1.4–7.7)
Platelets: 150 10*3/uL (ref 150.0–400.0)
RBC: 5.01 Mil/uL (ref 4.22–5.81)
RDW: 14.7 % (ref 11.5–15.5)
WBC: 6.1 10*3/uL (ref 4.0–10.5)

## 2013-11-23 LAB — HEMOGLOBIN A1C: Hgb A1c MFr Bld: 7.6 % — ABNORMAL HIGH (ref 4.6–6.5)

## 2013-11-23 LAB — BASIC METABOLIC PANEL
BUN: 14 mg/dL (ref 6–23)
CHLORIDE: 102 meq/L (ref 96–112)
CO2: 28 mEq/L (ref 19–32)
Calcium: 9.9 mg/dL (ref 8.4–10.5)
Creatinine, Ser: 1 mg/dL (ref 0.4–1.5)
GFR: 99.06 mL/min (ref >60.00)
Glucose, Bld: 129 mg/dL — ABNORMAL HIGH (ref 70–99)
POTASSIUM: 3.8 meq/L (ref 3.5–5.1)
Sodium: 137 mEq/L (ref 135–145)

## 2013-11-23 LAB — TSH: TSH: 1.21 u[IU]/mL (ref 0.35–4.50)

## 2013-11-23 NOTE — Patient Instructions (Signed)
Your next office appointment will be determined based upon review of your pending labs. Those instructions will be transmitted to you by mail. Followup as needed for your acute issue. Please report any significant change in your symptoms. Please take a probiotic , Florastor OR Align, every day until the bowels are normal. This will replace the normal bacteria which  are necessary for formation of normal stool and processing of food.

## 2013-11-23 NOTE — Progress Notes (Signed)
Pre visit review using our clinic review tool, if applicable. No additional management support is needed unless otherwise documented below in the visit note. 

## 2013-11-23 NOTE — Progress Notes (Signed)
Subjective:    Patient ID: Alex Massey, male    DOB: 31-May-1941, 73 y.o.   MRN: [id number]  HPI  He has noted changes in his bowels since his diabetic therapy was changed. In 11//2014 Amaryl was discontinued; he was placed on metformin 1000 mg with meals. He now has one to 2 loose to watery stools per day. Rarely is there any formed matter.  He has lost from 163 down to a weight of 153 since 05/13/2013.  The symptoms are essentially stable with no specific triggers or exacerbating factors.  In November and December he also had nausea and vomiting but this has not persisted  He specifically denies any travel; sick contacts; sick pets; or exposure to suspicious food or liquids as triggers. He does not drink well water. He has no history of stomach ulcers, colitis, diverticulitis. There is no history of any gluten or lactose intolerance. He has had no antibiotics for the last 3 months.  He has not treated the diarrhea.  Review of Systems  He specifically denies abdominal pain, hematemesis, melena, or rectal bleeding  He has no fever, chills, or sweats.  There's been no associated lightheadedness or decreased urine volume. He's had no syncope.  He has no dysuria, pyuria, or hematuria.         Objective:   Physical Exam  He appears much younger than his stated age. He appears very well nourished and in no distress. Arcus senilis is present bilaterally. There is suggestion of a brisk S4 versus a click at the left sternal border. Gen.: Healthy and well-nourished in appearance. Alert, appropriate and cooperative throughout exam. Head: Normocephalic without obvious abnormalities;  pattern alopecia  Eyes: No corneal or conjunctival inflammation noted. Pupils equal round reactive to light and accommodation. Extraocular motion intact. No icterus Ears: External  ear exam reveals no significant lesions or deformities.  Hearing is grossly normal bilaterally. Nose: External nasal  exam reveals no deformity or inflammation. Nasal mucosa are pink and moist. No lesions or exudates noted.   Mouth: Oral mucosa and oropharynx reveal no lesions or exudates. Teeth in good repair. Neck: No deformities, masses, or tenderness noted. Range of motion normal. Thyroid without enlargement or nodularity. Lungs: Normal respiratory effort; chest expands symmetrically. Lungs are clear to auscultation without rales, wheezes, or increased work of breathing. Heart: Normal rate and rhythm. Normal S1 and S2. No gallop, click, or rub. No murmur. Abdomen: Bowel sounds normal; abdomen soft and nontender. No masses, organomegaly or hernias noted.                                Musculoskeletal/extremities: No deformity or scoliosis noted of  the thoracic or lumbar spine.  No clubbing, cyanosis, edema, or significant extremity  deformity noted. Range of motion normal .Tone & strength normal. Hand joints normal .  Fingernail health good. Able to lie down & sit up w/o help. Negative SLR bilaterally Vascular: Carotid, radial artery, dorsalis pedis and  posterior tibial pulses are full and equal. No bruits present. Neurologic: Alert and oriented x3. Deep tendon reflexes symmetrical and normal.  Gait normal  Skin: Intact without suspicious lesions or rashes. Lymph: No cervical, axillary lymphadenopathy present. Psych: Mood and affect are normal. Normally interactive  Assessment & Plan:  See Current Assessment & Plan in Problem List under specific Diagnosis

## 2013-11-23 NOTE — Assessment & Plan Note (Signed)
Extended release metformin could be considered. A trial of probiotic is also appropriate. Thyroid function tests will be checked.

## 2013-11-23 NOTE — Assessment & Plan Note (Addendum)
   The most likely etiology of his bowel changes would be the metformin.  Realistic options would be to attempt preauthorization of extended release metformin.  Alternative option would be to initiate a drug such as Actos, starting with 15 mg daily and titrating to 30 mg if needed  Present diabetes status & thyroid status will be checked.  Resuming the sulfonylurea would not be wise because of the risk of hypoglycemia and possible long-term cardiac disease.

## 2013-12-13 ENCOUNTER — Telehealth: Payer: Self-pay | Admitting: Internal Medicine

## 2013-12-13 NOTE — Telephone Encounter (Signed)
Patient would like a call in regards to his lab results from 5/19

## 2013-12-13 NOTE — Telephone Encounter (Signed)
Spoke to patient's wife she states he is on his way to the office to get a copy of the results. A copy is at the front

## 2014-01-24 ENCOUNTER — Other Ambulatory Visit: Payer: Self-pay

## 2014-01-24 LAB — HM DIABETES EYE EXAM

## 2014-01-24 MED ORDER — FREESTYLE UNISTICK II LANCETS MISC: Status: DC

## 2014-02-04 ENCOUNTER — Encounter: Payer: Medicare Other | Admitting: Internal Medicine

## 2014-02-04 ENCOUNTER — Ambulatory Visit: Payer: Medicare Other | Admitting: Physician Assistant

## 2014-02-09 ENCOUNTER — Other Ambulatory Visit (INDEPENDENT_AMBULATORY_CARE_PROVIDER_SITE_OTHER): Payer: Medicare Other

## 2014-02-09 ENCOUNTER — Encounter: Payer: Self-pay | Admitting: Internal Medicine

## 2014-02-09 ENCOUNTER — Ambulatory Visit (INDEPENDENT_AMBULATORY_CARE_PROVIDER_SITE_OTHER): Payer: Medicare Other | Admitting: Internal Medicine

## 2014-02-09 VITALS — BP 132/78 | HR 65 | Temp 97.8°F | Ht 67.0 in | Wt 149.4 lb

## 2014-02-09 DIAGNOSIS — R634 Abnormal weight loss: Secondary | ICD-10-CM

## 2014-02-09 DIAGNOSIS — E119 Type 2 diabetes mellitus without complications: Secondary | ICD-10-CM

## 2014-02-09 DIAGNOSIS — F411 Generalized anxiety disorder: Secondary | ICD-10-CM | POA: Insufficient documentation

## 2014-02-09 DIAGNOSIS — R198 Other specified symptoms and signs involving the digestive system and abdomen: Secondary | ICD-10-CM

## 2014-02-09 DIAGNOSIS — R194 Change in bowel habit: Secondary | ICD-10-CM

## 2014-02-09 DIAGNOSIS — E785 Hyperlipidemia, unspecified: Secondary | ICD-10-CM

## 2014-02-09 LAB — LIPID PANEL
CHOL/HDL RATIO: 2
Cholesterol: 138 mg/dL (ref 0–200)
HDL: 56.7 mg/dL (ref >39.00)
LDL CALC: 59 mg/dL (ref 0–99)
NONHDL: 81.3
Triglycerides: 111 mg/dL (ref 0.0–149.0)
VLDL: 22.2 mg/dL (ref 0.0–40.0)

## 2014-02-09 LAB — HEMOGLOBIN A1C: Hgb A1c MFr Bld: 7.2 % — ABNORMAL HIGH (ref 4.6–6.5)

## 2014-02-09 MED ORDER — ALPRAZOLAM 0.5 MG PO TABS: 0.5000 mg | ORAL_TABLET | Freq: Four times a day (QID) | ORAL | Status: DC | PRN

## 2014-02-09 MED ORDER — PIOGLITAZONE HCL 15 MG PO TABS: 15.0000 mg | ORAL_TABLET | Freq: Every day | ORAL | Status: DC

## 2014-02-09 NOTE — Progress Notes (Signed)
Subjective:    Patient ID: Alex Massey, male    DOB: 14-Jan-1941, 73 y.o.   MRN: [id number]  HPI  New patient to me, prior PCP (Alex Massey) retired 3/015 Patient here for follow up Reviewed chronic medical issues and interval medical events  Past Medical History  Diagnosis Date  . DIABETES MELLITUS, TYPE II   . ERECTILE DYSFUNCTION   . FOOT DROP, RIGHT   . HYPERLIPIDEMIA   . NEPHROLITHIASIS, HX OF     Review of Systems  Constitutional: Positive for unexpected weight change. Negative for fever and fatigue.  Respiratory: Negative for cough and shortness of breath.   Cardiovascular: Negative for chest pain and leg swelling.  Gastrointestinal: Positive for diarrhea. Negative for nausea and abdominal pain.       Objective:   Physical Exam  BP 132/78  Pulse 65  Temp(Src) 97.8 F (36.6 C) (Oral)  Ht 5\' 7"  (1.702 m)  Wt 149 lb 6 oz (67.756 kg)  BMI 23.39 kg/m2  SpO2 97% Wt Readings from Last 3 Encounters:  02/09/14 149 lb 6 oz (67.756 kg)  11/23/13 153 lb 12.8 oz (69.763 kg)  06/30/13 163 lb (73.936 kg)   Constitutional: he appears well-developed and well-nourished. No distress.  Neck: Normal range of motion. Neck supple. No JVD present. No thyromegaly present.  Cardiovascular: Normal rate, regular rhythm and normal heart sounds.  No murmur heard. No BLE edema. Pulmonary/Chest: Effort normal and breath sounds normal. No respiratory distress. he has no wheezes.  Abdomen: soft nontender nondistended good bowel sounds no rebound or guarding Psychiatric: he has a normal mood and affect. His behavior is normal. Judgment and thought content normal.   Lab Results  Component Value Date   WBC 6.1 11/23/2013   HGB 12.9* 11/23/2013   HCT 40.3 11/23/2013   PLT 150.0 11/23/2013   GLUCOSE 129* 11/23/2013   CHOL 151 02/03/2013   TRIG 144.0 02/03/2013   HDL 47.40 02/03/2013   LDLCALC 75 02/03/2013   ALT 22 05/13/2013   AST 18 05/13/2013   NA 137 11/23/2013   K 3.8 11/23/2013   CL 102  11/23/2013   CREATININE 1.0 11/23/2013   BUN 14 11/23/2013   CO2 28 11/23/2013   TSH 1.21 11/23/2013   PSA 1.83 01/14/2011   HGBA1C 7.6* 11/23/2013   MICROALBUR 0.7 11/23/2013    Dg Cervical Spine Complete  05/25/2012   *RADIOLOGY REPORT*  Clinical Data: 73 year old male neck pain.  CERVICAL SPINE - COMPLETE 4+ VIEW  Comparison: None.  Findings: Straightening of cervical lordosis.  Prevertebral soft tissue contours are within normal limits.  Lower cervical spine degenerative endplate changes but relatively preserved disc spaces. Bilateral posterior element alignment is within normal limits.  AP alignment and lung apices within normal limits.  Facet hypertrophy maximal on the right at C3-C4.  C1-C2 alignment and odontoid within normal limits.  IMPRESSION: No acute osseous abnormality in the cervical spine.   Original Report Authenticated By: Roselyn Reef, M.D.       Assessment & Plan:   Problem List Items Addressed This Visit   Anxiety state, unspecified     Uses low-dose alprazolam as needed for "driving nerves" Reports low-dose no longer effective Will titrate up to 0.5 as needed now - rx done    Relevant Medications      ALPRAZolam Duanne Moron) tablet   Bowel habit changes     Ongoing since December 2014 addition of metformin, felt to be related to same Symptoms somewhat improved  with addition of probiotic may 2015, but not resolved Last colonoscopy July 2006 unremarkable Not associated with pain, blood, nausea or vomiting but unintentional weight loss Discontinue metformin now as discussed under diabetes Continue probiotic as needed If symptoms unimproved in next 6-8 weeks off metformin, will refer to GI or consider other imaging as needed    DIABETES MELLITUS, TYPE II - Primary      History of same for years, controlled with Amaryl until November 2014 OHA changed to metformin 05/2013, titrated up dose to 1000 BID 06/2013 Since that time, significant painless diarrhea with food  intake Subsequent unintentional weight loss >10# (baseline 160# for years until 06/2013) Recent lab evaluation may 2015 unremarkable for other cause of diarrhea A1c unimproved with addition of metformin  Recheck A1c now Stop metformin and begin low-dose Actos -we reviewed potential risk/benefit and possible side effects - pt understands and agrees to same  [place] office visit 6-8 weeks for diabetes review, weight check and status of diarrhea, patient call sooner if problems  Lab Results  Component Value Date   HGBA1C 7.6* 11/23/2013       Relevant Medications      pioglitazone (ACTOS) tablet   Other Relevant Orders      Hemoglobin A1c      Lipid panel   HYPERLIPIDEMIA     On statin Check lipids annually and titrate as needed    WEIGHT LOSS

## 2014-02-09 NOTE — Assessment & Plan Note (Signed)
Uses low-dose alprazolam as needed for "driving nerves" Reports low-dose no longer effective Will titrate up to 0.5 as needed now - rx done

## 2014-02-09 NOTE — Assessment & Plan Note (Signed)
History of same for years, controlled with Amaryl until November 2014 OHA changed to metformin 05/2013, titrated up dose to 1000 BID 06/2013 Since that time, significant painless diarrhea with food intake Subsequent unintentional weight loss >10# (baseline 160# for years until 06/2013) Recent lab evaluation may 2015 unremarkable for other cause of diarrhea A1c unimproved with addition of metformin  Recheck A1c now Stop metformin and begin low-dose Actos -we reviewed potential risk/benefit and possible side effects - pt understands and agrees to same  [place] office visit 6-8 weeks for diabetes review, weight check and status of diarrhea, patient call sooner if problems  Lab Results  Component Value Date   HGBA1C 7.6* 11/23/2013

## 2014-02-09 NOTE — Assessment & Plan Note (Signed)
Ongoing since December 2014 addition of metformin, felt to be related to same Symptoms somewhat improved with addition of probiotic may 2015, but not resolved Last colonoscopy July 2006 unremarkable Not associated with pain, blood, nausea or vomiting but unintentional weight loss Discontinue metformin now as discussed under diabetes Continue probiotic as needed If symptoms unimproved in next 6-8 weeks off metformin, will refer to GI or consider other imaging as needed

## 2014-02-09 NOTE — Assessment & Plan Note (Signed)
On statin Check lipids annually and titrate as needed 

## 2014-02-09 NOTE — Progress Notes (Signed)
Pre visit review using our clinic review tool, if applicable. No additional management support is needed unless otherwise documented below in the visit note. 

## 2014-02-09 NOTE — Patient Instructions (Signed)
It was good to see you today.  We have reviewed your prior records including labs and tests today  Test(s) ordered today. Your results will be released to [place] (or called to you) after review, usually within 72hours after test completion. If any changes need to be made, you will be notified at that same time.  Medications reviewed and updated Stop metformin Start low dose Actos for diabetes mellitus - one pill daily Increase alprazolam for nerve symptoms  Your prescription(s) have been submitted to your pharmacy. Please take as directed and contact our office if you believe you are having problem(s) with the medication(s).  Please schedule followup in 6-8 weeks to review diabetes mellitus, diarrhea and weight check; call sooner if problems.

## 2014-02-14 ENCOUNTER — Other Ambulatory Visit: Payer: Self-pay | Admitting: *Deleted

## 2014-02-14 ENCOUNTER — Other Ambulatory Visit: Payer: Self-pay

## 2014-02-14 MED ORDER — GLUCOSE BLOOD VI STRP: ORAL_STRIP | Status: DC

## 2014-02-14 NOTE — Telephone Encounter (Signed)
Glucose strips printed resending electronically...Alex Massey

## 2014-02-17 ENCOUNTER — Other Ambulatory Visit: Payer: Self-pay

## 2014-02-17 NOTE — Telephone Encounter (Signed)
Called pharm and confirmed that rx refill was received. They stated it was and that pt had picked them up.

## 2014-03-07 ENCOUNTER — Other Ambulatory Visit: Payer: Self-pay

## 2014-03-07 MED ORDER — TADALAFIL 20 MG PO TABS: ORAL_TABLET | ORAL | Status: DC

## 2014-04-05 ENCOUNTER — Other Ambulatory Visit: Payer: Self-pay

## 2014-04-05 MED ORDER — TADALAFIL 20 MG PO TABS: ORAL_TABLET | ORAL | Status: DC

## 2014-04-06 ENCOUNTER — Other Ambulatory Visit (INDEPENDENT_AMBULATORY_CARE_PROVIDER_SITE_OTHER): Payer: Medicare Other

## 2014-04-06 ENCOUNTER — Telehealth: Payer: Self-pay | Admitting: *Deleted

## 2014-04-06 ENCOUNTER — Ambulatory Visit (INDEPENDENT_AMBULATORY_CARE_PROVIDER_SITE_OTHER): Payer: Medicare Other | Admitting: Internal Medicine

## 2014-04-06 ENCOUNTER — Encounter: Payer: Self-pay | Admitting: Internal Medicine

## 2014-04-06 VITALS — BP 122/80 | HR 66 | Temp 97.8°F | Ht 67.0 in | Wt 156.0 lb

## 2014-04-06 DIAGNOSIS — E119 Type 2 diabetes mellitus without complications: Secondary | ICD-10-CM

## 2014-04-06 DIAGNOSIS — R198 Other specified symptoms and signs involving the digestive system and abdomen: Secondary | ICD-10-CM

## 2014-04-06 DIAGNOSIS — Z23 Encounter for immunization: Secondary | ICD-10-CM

## 2014-04-06 DIAGNOSIS — M216X9 Other acquired deformities of unspecified foot: Secondary | ICD-10-CM

## 2014-04-06 DIAGNOSIS — R194 Change in bowel habit: Secondary | ICD-10-CM

## 2014-04-06 DIAGNOSIS — Z79899 Other long term (current) drug therapy: Secondary | ICD-10-CM

## 2014-04-06 DIAGNOSIS — E1149 Type 2 diabetes mellitus with other diabetic neurological complication: Secondary | ICD-10-CM

## 2014-04-06 LAB — HEPATIC FUNCTION PANEL
ALT: 18 U/L (ref 0–53)
AST: 16 U/L (ref 0–37)
Albumin: 4.4 g/dL (ref 3.5–5.2)
Alkaline Phosphatase: 70 U/L (ref 39–117)
BILIRUBIN DIRECT: 0.1 mg/dL (ref 0.0–0.3)
Total Bilirubin: 1.1 mg/dL (ref 0.2–1.2)
Total Protein: 7.6 g/dL (ref 6.0–8.3)

## 2014-04-06 LAB — HEMOGLOBIN A1C: HEMOGLOBIN A1C: 8.2 % — AB (ref 4.6–6.5)

## 2014-04-06 MED ORDER — PIOGLITAZONE HCL 30 MG PO TABS: 30.0000 mg | ORAL_TABLET | Freq: Every day | ORAL | Status: DC

## 2014-04-06 MED ORDER — SILDENAFIL CITRATE 100 MG PO TABS: 50.0000 mg | ORAL_TABLET | Freq: Every day | ORAL | Status: DC | PRN

## 2014-04-06 MED ORDER — PIOGLITAZONE HCL 15 MG PO TABS
15.0000 mg | ORAL_TABLET | Freq: Every day | ORAL | Status: DC
Start: 2014-04-06 — End: 2014-04-06

## 2014-04-06 NOTE — Assessment & Plan Note (Signed)
Ongoing since December 2014 because of metformin Resolved after DC metformin 02/2014 Continue probiotic as needed Weight loss trend >10# (baseline 160# for years until 06/2013) reversed since 02/2014

## 2014-04-06 NOTE — Progress Notes (Signed)
Pre visit review using our clinic review tool, if applicable. No additional management support is needed unless otherwise documented below in the visit note. 

## 2014-04-06 NOTE — Assessment & Plan Note (Signed)
History of same for years, controlled with Amaryl until November 2014 OHA changed to metformin 05/2013, titrated up dose to 1000 BID 06/2013 Dose caused significant painless diarrhea with food intake and subsequent unintentional weight loss  DC metformin 02/2014 with improved diarrhea, wt loss reversed On actos but cbgs sub op by hx Titrate up now - erx done  Lab Results  Component Value Date   HGBA1C 7.2* 02/09/2014

## 2014-04-06 NOTE — Progress Notes (Signed)
Subjective:    Patient ID: Alex Massey, male    DOB: [birth year], 73 y.o.   MRN: [id number]  HPI  Patient here for follow up Reviewed chronic medical issues and interval medical events  Diarrhea resolved off metformin Actos tolerated well, but cbgs >150  Past Medical History  Diagnosis Date  . DIABETES MELLITUS, TYPE II   . ERECTILE DYSFUNCTION   . FOOT DROP, RIGHT   . HYPERLIPIDEMIA   . NEPHROLITHIASIS, HX OF     Review of Systems  Constitutional: Negative for fever, activity change, appetite change, fatigue and unexpected weight change.  Respiratory: Negative for cough and shortness of breath.   Cardiovascular: Negative for chest pain and palpitations.  Gastrointestinal: Negative for abdominal pain and diarrhea.  Neurological: Negative for weakness and headaches.  All other systems reviewed and are negative.      Objective:   Physical Exam  BP 122/80  Pulse 66  Temp(Src) 97.8 F (36.6 C) (Oral)  Ht 5\' 7"  (1.702 m)  Wt 156 lb (70.761 kg)  BMI 24.43 kg/m2  SpO2 97% Wt Readings from Last 3 Encounters:  04/06/14 156 lb (70.761 kg)  02/09/14 149 lb 6 oz (67.756 kg)  11/23/13 153 lb 12.8 oz (69.763 kg)   Constitutional: he appears well-developed and well-nourished. No distress.  Neck: Normal range of motion. Neck supple. No JVD present. No thyromegaly present.  Cardiovascular: Normal rate, regular rhythm and normal heart sounds.  No murmur heard. No BLE edema. Pulmonary/Chest: Effort normal and breath sounds normal. No respiratory distress. he has no wheezes.  Psychiatric: he has a normal mood and affect. His behavior is normal. Judgment and thought content normal.   Lab Results  Component Value Date   WBC 6.1 11/23/2013   HGB 12.9* 11/23/2013   HCT 40.3 11/23/2013   PLT 150.0 11/23/2013   GLUCOSE 129* 11/23/2013   CHOL 138 02/09/2014   TRIG 111.0 02/09/2014   HDL 56.70 02/09/2014   LDLCALC 59 02/09/2014   ALT 22 05/13/2013   AST 18 05/13/2013   NA 137 11/23/2013     K 3.8 11/23/2013   CL 102 11/23/2013   CREATININE 1.0 11/23/2013   BUN 14 11/23/2013   CO2 28 11/23/2013   TSH 1.21 11/23/2013   PSA 1.83 01/14/2011   HGBA1C 7.2* 02/09/2014   MICROALBUR 0.7 11/23/2013    Dg Cervical Spine Complete  05/25/2012   *RADIOLOGY REPORT*  Clinical Data: 73 year old male neck pain.  CERVICAL SPINE - COMPLETE 4+ VIEW  Comparison: None.  Findings: Straightening of cervical lordosis.  Prevertebral soft tissue contours are within normal limits.  Lower cervical spine degenerative endplate changes but relatively preserved disc spaces. Bilateral posterior element alignment is within normal limits.  AP alignment and lung apices within normal limits.  Facet hypertrophy maximal on the right at C3-C4.  C1-C2 alignment and odontoid within normal limits.  IMPRESSION: No acute osseous abnormality in the cervical spine.   Original Report Authenticated By: Roselyn Reef, M.D.       Assessment & Plan:   Problem List Items Addressed This Visit   Bowel habit changes     Ongoing since December 2014 because of metformin Resolved after DC metformin 02/2014 Continue probiotic as needed Weight loss trend >10# (baseline 160# for years until 06/2013) reversed since 02/2014     DIABETES MELLITUS, TYPE II - Primary      History of same for years, controlled with Amaryl until November 2014 OHA changed to metformin 05/2013,  titrated up dose to 1000 BID 06/2013 Dose caused significant painless diarrhea with food intake and subsequent unintentional weight loss  DC metformin 02/2014 with improved diarrhea, wt loss reversed On actos but cbgs sub op by hx Titrate up now - erx done  Lab Results  Component Value Date   HGBA1C 7.2* 02/09/2014      Relevant Medications      pioglitazone (ACTOS) tablet   Other Relevant Orders      Hemoglobin A1c   FOOT DROP, RIGHT     Hx same, 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 per Dr Sharol Given Prev using AFO per ortho Pt reports due  for follow up - will arrange ortho refer for same    Relevant Orders      Ambulatory referral to Orthopedic Surgery    Other Visit Diagnoses   Need for prophylactic vaccination and inoculation against influenza        Relevant Orders       Flu Vaccine QUAD 36+ mos PF IM (Fluarix Quad PF) (Completed)    Encounter for long-term (current) use of other medications        Relevant Orders       Hepatic function panel    Type II or unspecified type diabetes mellitus with neurological manifestations, not stated as uncontrolled        Relevant Medications       pioglitazone (ACTOS) tablet    Other Relevant Orders       Ambulatory referral to Orthopedic Surgery

## 2014-04-06 NOTE — Patient Instructions (Addendum)
It was good to see you today.  We have reviewed your prior records including labs and tests today  Your annual flu shot was given and/or updated today.  Test(s) ordered today. Your results will be released to [place] (or called to you) after review, usually within 72hours after test completion. If any changes need to be made, you will be notified at that same time.  Medications reviewed and updated,  Increas Actos to 30mg  daily and use Viagra in place of Cialis (or either for cost savings) - Your prescription(s) have been submitted to your pharmacy (Viagra rx given to you). Please take as directed and contact our office if you believe you are having problem(s) with the medication(s).  No other changes recommended at this time.  we'll make referral to Dr Sharol Given . Our office will contact you regarding appointment(s) once made.  Please schedule followup in 6 months, call sooner if problems.

## 2014-04-06 NOTE — Telephone Encounter (Signed)
Left msg on triage received 2 electronic script for actos 15 & 30 mg need to know which strength should be filled. Called pharmacy spoke with [facility] inform her it should have been 30 mg. Also she was asking about the refill on pt cialis. Per chart md approved yesterday #6 with 2 additional refills. Gave Dawn md authorization...Johny Chess

## 2014-04-06 NOTE — Assessment & Plan Note (Signed)
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

## 2014-04-27 ENCOUNTER — Telehealth: Payer: Self-pay | Admitting: Internal Medicine

## 2014-04-27 MED ORDER — SIMVASTATIN 20 MG PO TABS: 20.0000 mg | ORAL_TABLET | Freq: Every day | ORAL | Status: DC

## 2014-04-27 NOTE — Telephone Encounter (Signed)
Notified pt [facility]...Alex Massey

## 2014-04-27 NOTE — Telephone Encounter (Signed)
Patient is requesting 3 month refill of zocor sent to [facility].

## 2014-05-18 ENCOUNTER — Telehealth: Payer: Self-pay | Admitting: Internal Medicine

## 2014-05-18 NOTE — Telephone Encounter (Signed)
Patient Information:  Caller Name: Octavious  Phone: [phone]  Patient: Alex Massey, Alex Massey  Gender: Male  DOB: 1940-12-10  Age: 73 Years  PCP: Gwendolyn Grant (Adults only)  Office Follow Up:  Does the office need to follow up with this patient?: Yes  Instructions For The Office: Patient is concerned about blood sugar elevated 240> for last weeks.   Changed to Actos in September. Discussed importance of water and diet. Last Diabetic teaching 10-15 years ago. Discussed Carb intake and patient could use more teaching. Please contact concerning elevated blood sugar. Pleaes contact.  RN Note:  Patient is concerned about blood sugar elevated 240> for last weeks.   Changed to Actos in September. Discussed importance of water and diet. Last Diabetic teaching 10-15 years ago. Discussed Carb intake and patient could use more teaching. Please contact concerning elevated blood sugar. [place]aes contact.  Symptoms  Reason For Call & Symptoms: Patient reports that he has been taking prednisone for back problems.  He has been off the medication over a week . He took the medication for 10 days. Marland Kitchen  His blood sugar has been running 246- for 2-3 weeks.  He is currently on Actos 30 mg once daily. He currently has a cold and taking OTC products- Allegra, sugar free cough medications.  He reports he does mointor Carb intake. He walks for an hour daily..   A1C in sept was 8 and he was changed from metformin to the Actos.  Last Diabetic teaching class was 10-15 years ago.  Diet yesterday-  Eggs, toast x2 whole wheat or cheerios, pimento cheese sandwich on whole wheat, rice, greens , meat, yogurt, fried apple pies.  Reviewed Health History In EMR: Yes  Reviewed Medications In EMR: Yes  Reviewed Allergies In EMR: Yes  Reviewed Surgeries / Procedures: Yes  Date of Onset of Symptoms: 05/04/2014  Treatments Tried: Allegra OTC sugar free Cough syrup  Treatments Tried Worked: No  Guideline(s) Used:  Diabetes - High  Blood Sugar  Disposition Per Guideline:   Home Care  Reason For Disposition Reached:   Blood glucose > 240 mg/dl (13 mmol/l)  Advice Given:  General  Definition of hyperglycemia: - Fasting blood glucose more than 140 mg/dL (7.5 mmol/l) or random blood glucose more than 200 mg/dL (11 mmol/l).  Symptoms of severe hyperglycemia: weakness, progressing to confusion and coma.  Treatment - Liquids  Drink at least one glass (8 oz or 240 ml) of water per hour for the next 4 hours. (Reason: adequate hydration will reduce hyperglycemia).  Generally, you should try to drink 6-8 glasses of water each day.  Call Back If:  Blood glucose more than 300 mg/dL (16.5 mmol/l), 2 or more times in a row.  Vomiting lasting more than 4 hours or unable to drink any liquids.  Rapid breathing occurs  You become worse.  RN Overrode Recommendation:  Document Patient  Patient is concerned about blood sugar elevated 240> for last weeks.   Changed to Actos in September. Discussed importance of water and diet. Last Diabetic teaching 10-15 years ago. Discussed Carb intake and patient could use more teaching. Please contact concerning elevated blood sugar. [place]aes contact.

## 2014-05-18 NOTE — Telephone Encounter (Signed)
Notified pt with md response. Made appt to see Dr. Jenny Reichmann tomorrow @ 8:15...Alex Massey

## 2014-05-18 NOTE — Telephone Encounter (Signed)
Please schedule acute OV with any provider to address uncontrolled DM thanks

## 2014-05-19 ENCOUNTER — Ambulatory Visit (INDEPENDENT_AMBULATORY_CARE_PROVIDER_SITE_OTHER): Payer: Medicare Other | Admitting: Internal Medicine

## 2014-05-19 ENCOUNTER — Encounter: Payer: Self-pay | Admitting: Internal Medicine

## 2014-05-19 VITALS — BP 120/78 | HR 74 | Temp 98.0°F | Ht 67.0 in | Wt 157.8 lb

## 2014-05-19 DIAGNOSIS — E119 Type 2 diabetes mellitus without complications: Secondary | ICD-10-CM

## 2014-05-19 MED ORDER — METFORMIN HCL ER 500 MG PO TB24: ORAL_TABLET | ORAL | Status: DC

## 2014-05-19 NOTE — Assessment & Plan Note (Signed)
Uncontrolled, with a complicated picture of mult meds and doses in the past year - 18 mo, with the main feature being the fact of being unable to tolerate the metformin at 1000 bid dosing (but did tolerate the 500 bid).  Recent wt gain back to baseline, steroid use and URi may be a factors as well recently.  Lab Results  Component Value Date   HGBA1C 8.2* 04/06/2014   For now will cont actos at 30 mg, but re-add the metformin at the ER version 500 mg - 2 qd to help reduce further chance of GI upset; to cont check cbg's , call or return in 90 days if not significantly improved, o/w will plan to f/u with PCP April 2015 as planned, cont diet and exercise as he does,  Wt overall back to baseline now

## 2014-05-19 NOTE — Progress Notes (Signed)
Subjective:    Patient ID: Alex Massey, male    DOB: Apr 14, 1941, 73 y.o.   MRN: [id number]  HPI  Here to f/u elev blood sugars; most recently had to d/c the metformin aug 2015 due to GI upset, diarrhea, wt loss, after initial evel per Dr Linna Darner proved neg may 2015;  In retrospect pt with symtpoms since glimeparide d/c and metformin increased from 500 bid to 1000 bid in dec 2015.  Tolerated the 500 bid ok. More recently A1c was 8.2, states no real change in diet.  Does not think activity leveloverall changed, walks every day for exercise up to 1 hr per day (likely gets at least 2.5 miles, often walks with wife at the [place] or the neighborhood.)  Wt overall stable, usually about 155 -160 all his life, at one point with the metformin 1000 bid had lost as much down to 140 by his count, Wt in aug 2105  - 149 in office, now back to 157 today   Wt Readings from Last 3 Encounters:  05/19/14 157 lb 12 oz (71.555 kg)  04/06/14 156 lb (70.761 kg)  02/09/14 149 lb 6 oz (67.756 kg)  Despite increased actos to 30 mg in aug, still having blood sugars in 200's - ave about 240.   Pt denies polydipsia, polyuria, or low sugar symptoms such as weakness or confusion improved with po intake.  Pt states overall good compliance with meds, trying to follow lower cholesterol, diabetic diet.  Did have lower back pain with some ? urinary freq, saw Dr Mitzie Na, tx with prednisone and all seemed to improve. Total oral prednisone tx x 10 days (last dose taken approx 2 wks ago.  After that, urinary freq better, no fever and Denies urinary symptoms such as dysuria, frequency, urgency, flank pain, hematuria or n/v, fever, chills.  Since that time, also experienced what sounds like a viral URI with st, fatigue, congestion now resolved by today Past Medical History  Diagnosis Date  . DIABETES MELLITUS, TYPE II   . ERECTILE DYSFUNCTION   . FOOT DROP, RIGHT   . HYPERLIPIDEMIA   . NEPHROLITHIASIS, HX OF    Past Surgical  History  Procedure Laterality Date  . L-spine surgery  2000    reports that he quit smoking about 9 years ago. His smoking use included Cigarettes. He smoked 0.00 packs per day. He has never used smokeless tobacco. He reports that he drinks about 1.5 oz of alcohol per week. He reports that he does not use illicit drugs. family history includes Colon cancer in his brother; Diabetes in his father. No Known Allergies Current Outpatient Prescriptions on File Prior to Visit  Medication Sig Dispense Refill  . ALPRAZolam (XANAX) 0.5 MG tablet Take 1 tablet (0.5 mg total) by mouth every 6 (six) hours as needed for anxiety. 30 tablet 5  . B Complex Vitamins (VITAMIN-B COMPLEX) TABS Take by mouth 2 (two) times daily.    Marland Kitchen FREESTYLE UNISTICK II LANCETS MISC Pt to test blood sugar twice daily. Dx: 250.00 100 each 3  . glucose blood (FREESTYLE TEST STRIPS) test strip Use to check blood sugars twice a day dx 250.00 100 each 3  . glucose blood test strip 1 each by Other route daily.    . pioglitazone (ACTOS) 30 MG tablet Take 1 tablet (30 mg total) by mouth daily. 90 tablet 3  . sildenafil (VIAGRA) 100 MG tablet Take 0.5-1 tablets (50-100 mg total) by mouth daily as needed for  erectile dysfunction. 5 tablet 3  . simvastatin (ZOCOR) 20 MG tablet Take 1 tablet (20 mg total) by mouth at bedtime. 90 tablet 3  . tadalafil (CIALIS) 20 MG tablet Take one tablet by mouth as needed 6 tablet 2   No current facility-administered medications on file prior to visit.   Review of Systems  Constitutional: Negative for unusual diaphoresis or other sweats  HENT: Negative for ringing in ear Eyes: Negative for double vision or worsening visual disturbance.  Respiratory: Negative for choking and stridor.   Gastrointestinal: Negative for vomiting or other signifcant bowel change Genitourinary: Negative for hematuria or decreased urine volume.  Musculoskeletal: Negative for other MSK pain or swelling Skin: Negative for color  change and worsening wound.  Neurological: Negative for tremors and numbness other than noted  Psychiatric/Behavioral: Negative for decreased concentration or agitation other than above       Objective:   Physical Exam BP 120/78 mmHg  Pulse 74  Temp(Src) 98 F (36.7 C) (Oral)  Ht 5\' 7"  (1.702 m)  Wt 157 lb 12 oz (71.555 kg)  BMI 24.70 kg/m2  SpO2 92% VS noted, not ill appaering Constitutional: Pt appears well-developed, well-nourished.  HENT: Head: NCAT.  Right Ear: External ear normal.  Left Ear: External ear normal.  Eyes: . Pupils are equal, round, and reactive to light. Conjunctivae and EOM are normal Neck: Normal range of motion. Neck supple.  Cardiovascular: Normal rate and regular rhythm.   Pulmonary/Chest: Effort normal and breath sounds normal.  Abd:  Soft, NT, ND, + BS Neurological: Pt is alert. Not confused , motor grossly intact Skin: Skin is warm. No rash Psychiatric: Pt behavior is normal. No agitation.     Assessment & Plan:

## 2014-05-19 NOTE — Patient Instructions (Addendum)
OK to re-start the metformin in the form of metformin ER 500 mg - 2 tabs in the AM  Please continue all other medications as before, and refills have been done if requested.  Please have the pharmacy call with any other refills you may need.  Please continue your efforts at being more active, low cholesterol diet, and weight control.  Please keep your appointments with your specialists as you may have planned  Please continue to monitor your CBG's as you do, and return in 3 months for f/u labs if you are often > 200 on current regimen  Otherwise, OK to f/u with Dr Asa Lente as you have planned in April 2015; Please return in 3 months, or sooner if needed, with Lab testing done 3-5 days before

## 2014-05-19 NOTE — Addendum Note (Signed)
Addended by: Sharon Seller B on: 05/19/2014 09:20 AM   Modules accepted: Orders

## 2014-05-19 NOTE — Progress Notes (Signed)
Pre visit review using our clinic review tool, if applicable. No additional management support is needed unless otherwise documented below in the visit note. 

## 2014-05-23 ENCOUNTER — Telehealth: Payer: Self-pay | Admitting: Internal Medicine

## 2014-05-23 MED ORDER — PIOGLITAZONE HCL 45 MG PO TABS: 45.0000 mg | ORAL_TABLET | Freq: Every day | ORAL | Status: DC

## 2014-05-23 NOTE — Telephone Encounter (Signed)
It appears unfortuanately that pt will not be able to tolerate even lower dose metformin  I will put metformin on his "allergy/intolerance" list  Ok to d/c the metformin  Will also need to d/c the actos 30 mg  OK to Start Actos 45 mg per day - new rx sent erx  Pt to follow up otherwise as planned with Dr Asa Lente

## 2014-05-23 NOTE — Telephone Encounter (Signed)
Notified pt with Md response. Forwarding  To Dr. Jenny Reichmann for his response...Johny Chess

## 2014-05-23 NOTE — Telephone Encounter (Signed)
Called pt no answer LMOM with md response.../lmb 

## 2014-05-23 NOTE — Telephone Encounter (Signed)
Metformin must be taken at the end of full meal . Hold Metformin ; send message to Dr Caryn Section 11/17 as to alternative choices

## 2014-05-23 NOTE — Telephone Encounter (Signed)
MD is out of the office today. Pls advise on msg...Johny Chess

## 2014-05-23 NOTE — Telephone Encounter (Signed)
Pt states he was prescribed a new diabetes medication from Dr Jenny Reichmann, pt says he has had diarrhea ever since as well as a stomachache. Pt discontinued using two days ago. Pt wants to know what to do regarding med, or get different one?

## 2014-05-24 ENCOUNTER — Telehealth: Payer: Self-pay | Admitting: *Deleted

## 2014-05-24 MED ORDER — PIOGLITAZONE HCL 45 MG PO TABS: 45.0000 mg | ORAL_TABLET | Freq: Every day | ORAL | Status: DC

## 2014-05-24 NOTE — Telephone Encounter (Signed)
Pt called and stated that the actos was not sent into pharmacy. Inform pt md sent [facility]. Pt states he doesn't use [facility] wanting med sent to [facility] on [address]...Johny Chess

## 2014-07-12 ENCOUNTER — Other Ambulatory Visit: Payer: Self-pay | Admitting: Orthopedic Surgery

## 2014-07-12 DIAGNOSIS — M545 Low back pain: Secondary | ICD-10-CM

## 2014-07-17 ENCOUNTER — Ambulatory Visit
Admission: RE | Admit: 2014-07-17 | Discharge: 2014-07-17 | Disposition: A | Discharge status: Home / Self Care | Payer: Medicare Other | Source: Ambulatory Visit | Attending: Orthopedic Surgery | Admitting: Orthopedic Surgery

## 2014-07-17 DIAGNOSIS — M545 Low back pain: Secondary | ICD-10-CM

## 2014-09-09 ENCOUNTER — Other Ambulatory Visit (INDEPENDENT_AMBULATORY_CARE_PROVIDER_SITE_OTHER): Payer: Medicare Other

## 2014-09-09 DIAGNOSIS — E119 Type 2 diabetes mellitus without complications: Secondary | ICD-10-CM

## 2014-09-09 LAB — HEMOGLOBIN A1C: Hgb A1c MFr Bld: 11 % — ABNORMAL HIGH (ref 4.6–6.5)

## 2014-09-09 LAB — BASIC METABOLIC PANEL
BUN: 19 mg/dL (ref 6–23)
CHLORIDE: 103 meq/L (ref 96–112)
CO2: 30 mEq/L (ref 19–32)
Calcium: 10 mg/dL (ref 8.4–10.5)
Creatinine, Ser: 0.91 mg/dL (ref 0.40–1.50)
GFR: 105.13 mL/min (ref >60.00)
Glucose, Bld: 190 mg/dL — ABNORMAL HIGH (ref 70–99)
POTASSIUM: 4.1 meq/L (ref 3.5–5.1)
Sodium: 137 mEq/L (ref 135–145)

## 2014-09-09 LAB — LIPID PANEL
CHOLESTEROL: 167 mg/dL (ref 0–200)
HDL: 81.7 mg/dL (ref >39.00)
LDL Cholesterol: 66 mg/dL (ref 0–99)
NonHDL: 85.3
Total CHOL/HDL Ratio: 2
Triglycerides: 98 mg/dL (ref 0.0–149.0)
VLDL: 19.6 mg/dL (ref 0.0–40.0)

## 2014-09-09 LAB — HEPATIC FUNCTION PANEL
ALT: 16 U/L (ref 0–53)
AST: 14 U/L (ref 0–37)
Albumin: 4.5 g/dL (ref 3.5–5.2)
Alkaline Phosphatase: 91 U/L (ref 39–117)
BILIRUBIN TOTAL: 0.5 mg/dL (ref 0.2–1.2)
Bilirubin, Direct: 0.1 mg/dL (ref 0.0–0.3)
Total Protein: 7.2 g/dL (ref 6.0–8.3)

## 2014-09-12 ENCOUNTER — Encounter: Payer: Self-pay | Admitting: Internal Medicine

## 2014-09-12 ENCOUNTER — Ambulatory Visit (INDEPENDENT_AMBULATORY_CARE_PROVIDER_SITE_OTHER): Payer: Medicare Other | Admitting: Internal Medicine

## 2014-09-12 VITALS — BP 158/84 | HR 67 | Temp 97.5°F | Resp 16 | Wt 164.0 lb

## 2014-09-12 DIAGNOSIS — Z23 Encounter for immunization: Secondary | ICD-10-CM

## 2014-09-12 DIAGNOSIS — N4830 Priapism, unspecified: Secondary | ICD-10-CM

## 2014-09-12 DIAGNOSIS — I10 Essential (primary) hypertension: Secondary | ICD-10-CM

## 2014-09-12 DIAGNOSIS — E1165 Type 2 diabetes mellitus with hyperglycemia: Secondary | ICD-10-CM

## 2014-09-12 DIAGNOSIS — F528 Other sexual dysfunction not due to a substance or known physiological condition: Secondary | ICD-10-CM

## 2014-09-12 DIAGNOSIS — Z0000 Encounter for general adult medical examination without abnormal findings: Secondary | ICD-10-CM

## 2014-09-12 DIAGNOSIS — E118 Type 2 diabetes mellitus with unspecified complications: Secondary | ICD-10-CM

## 2014-09-12 DIAGNOSIS — IMO0002 Reserved for concepts with insufficient information to code with codable children: Secondary | ICD-10-CM

## 2014-09-12 MED ORDER — LOSARTAN POTASSIUM 25 MG PO TABS: 25.0000 mg | ORAL_TABLET | Freq: Every day | ORAL | Status: DC

## 2014-09-12 MED ORDER — SITAGLIPTIN PHOSPHATE 100 MG PO TABS: 100.0000 mg | ORAL_TABLET | Freq: Every day | ORAL | Status: DC

## 2014-09-12 NOTE — Assessment & Plan Note (Signed)
Uncontrolled at present time History of same for years, controlled with Amaryl until November 2014 [facility] changed to metformin 05/2013, titrated up dose to 1000 BID 06/2013 Dose caused significant painless diarrhea with food intake and subsequent unintentional weight loss  DC metformin 02/2014 with improved diarrhea, wt loss reversed - but cbgs uncontrolled On actos, titrated to max, but ineffective on cbg control DC Actos, start januvia - we reviewed potential risk/benefit and possible side effects - pt understands and agrees to same  - erx done Pt will monitor and call if cbg remains >200  Lab Results  Component Value Date   HGBA1C 11.0* 09/09/2014

## 2014-09-12 NOTE — Patient Instructions (Addendum)
It was good to see you today.  We have reviewed your prior records including labs and tests today  Health Maintenance reviewed - Prevnar 13 (pneumnia vaccine) updated today - all other recommended immunizations and age-appropriate screenings are up-to-date.  Medications reviewed and updated Stop Actos (pioglitazon) Start Januvia once daily for diabetes mellitus - prescription given to you to take to the pharmacy Also start losartan 96m once daily for kidney protection and blood pressure   Your prescription(s) have been submitted to your pharmacy. Please take as directed and contact our office if you believe you are having problem(s) with the medication(s).  we'll make referral to urology for evaluation of your painful condition and erectile dysfunction . Our office will contact you regarding appointment(s) once made.  Please schedule followup in 3 months for diabetes recheck and labs, call sooner if problems.  Diabetes and Standards of Medical Care Diabetes is complicated. You may find that your diabetes team includes a dietitian, nurse, diabetes educator, eye doctor, and more. To help everyone know what is going on and to help you get the care you deserve, the following schedule of care was developed to help keep you on track. Below are the tests, exams, vaccines, medicines, education, and plans you will need. HbA1c test This test shows how well you have controlled your glucose over the past 2-3 months. It is used to see if your diabetes management plan needs to be adjusted.   It is performed at least 2 times a year if you are meeting treatment goals.  It is performed 4 times a year if therapy has changed or if you are not meeting treatment goals. Blood pressure test  This test is performed at every routine medical visit. The goal is less than 140/90 mm Hg for most people, but 130/80 mm Hg in some cases. Ask your health care provider about your goal. Dental exam  Follow up with the  dentist regularly. Eye exam  If you are diagnosed with type 1 diabetes as a child, get an exam upon reaching the age of 133years or older and have had diabetes for 3-5 years. Yearly eye exams are recommended after that initial eye exam.  If you are diagnosed with type 1 diabetes as an adult, get an exam within 5 years of diagnosis and then yearly.  If you are diagnosed with type 2 diabetes, get an exam as soon as possible after the diagnosis and then yearly. Foot care exam  Visual foot exams are performed at every routine medical visit. The exams check for cuts, injuries, or other problems with the feet.  A comprehensive foot exam should be done yearly. This includes visual inspection as well as assessing foot pulses and testing for loss of sensation.  Check your feet nightly for cuts, injuries, or other problems with your feet. Tell your health care provider if anything is not healing. Kidney function test (urine microalbumin)  This test is performed once a year.  Type 1 diabetes: The first test is performed 5 years after diagnosis.  Type 2 diabetes: The first test is performed at the time of diagnosis.  A serum creatinine and estimated glomerular filtration rate (eGFR) test is done once a year to assess the level of chronic kidney disease (CKD), if present. Lipid profile (cholesterol, HDL, LDL, triglycerides)  Performed every 5 years for most people.  The goal for LDL is less than 100 mg/dL. If you are at high risk, the goal is less than 70 mg/dL.  The goal for HDL is 40 mg/dL-50 mg/dL for men and 50 mg/dL-60 mg/dL for women. An HDL cholesterol of 60 mg/dL or higher gives some protection against heart disease.  The goal for triglycerides is less than 150 mg/dL. Influenza vaccine, pneumococcal vaccine, and hepatitis B vaccine  The influenza vaccine is recommended yearly.  It is recommended that people with diabetes who are over 75 years old get the pneumonia vaccine. In some  cases, two separate shots may be given. Ask your health care provider if your pneumonia vaccination is up to date.  The hepatitis B vaccine is also recommended for adults with diabetes. Diabetes self-management education  Education is recommended at diagnosis and ongoing as needed. Treatment plan  Your treatment plan is reviewed at every medical visit. Document Released: 04/21/2009 Document Revised: 11/08/2013 Document Reviewed: 11/24/2012 [facility] Patient Information 2015 [place], [place]. This information is not intended to replace advice given to you by your health care provider. Make sure you discuss any questions you have with your health care provider.

## 2014-09-12 NOTE — Progress Notes (Signed)
Subjective:    Patient ID: Alex Massey, male    DOB: Mar 18, 1941, 74 y.o.   MRN: [id number]  HPI   Here for medicare wellness  Diet: heart healthy, carb modified / diabetic Physical activity: sedentary Depression/mood screen: negative Hearing: intact to whispered voice Visual acuity: grossly normal, performs annual eye exam  ADLs: capable Fall risk: none Home safety: good Cognitive evaluation: intact to orientation, naming, recall and repetition EOL planning: adv directives, full code/ I agree  I have personally reviewed and have noted 1. The patient's medical and social history 2. Their use of alcohol, tobacco or illicit drugs 3. Their current medications and supplements 4. The patient's functional ability including ADL's, fall risks, home safety risks and hearing or visual impairment. 5. Diet and physical activities 6. Evidence for depression or mood disorders  Also reviewed current concerns and interval events  Past Medical History  Diagnosis Date  . DIABETES MELLITUS, TYPE II   . ERECTILE DYSFUNCTION   . FOOT DROP, RIGHT   . HYPERLIPIDEMIA   . NEPHROLITHIASIS, HX OF    Family History  Problem Relation Age of Onset  . Diabetes Father   . Colon cancer Brother    History  Substance Use Topics  . Smoking status: Former Smoker    Types: Cigarettes    Quit date: 07/09/2004  . Smokeless tobacco: Never Used  . Alcohol Use: 1.5 oz/week    3 drink(s) per week    Review of Systems  Constitutional: Positive for fatigue. Negative for fever, activity change, appetite change and unexpected weight change.  Respiratory: Negative for cough, chest tightness, shortness of breath and wheezing.   Cardiovascular: Negative for chest pain, palpitations and leg swelling.  Genitourinary: Negative for frequency, decreased urine volume, genital sores and testicular pain.       Chronic ED, now with increasing erectile pain  Neurological: Negative for dizziness, weakness and  headaches.  Psychiatric/Behavioral: Negative for dysphoric mood. The patient is not nervous/anxious.   All other systems reviewed and are negative.      Objective:    Physical Exam  Constitutional: He appears well-developed and well-nourished. No distress.  Cardiovascular: Normal rate, regular rhythm and normal heart sounds.   No murmur heard. Pulmonary/Chest: Effort normal and breath sounds normal. No respiratory distress.  Psychiatric: He has a normal mood and affect. His behavior is normal. Judgment and thought content normal.    BP 158/84 mmHg  Pulse 67  Temp(Src) 97.5 F (36.4 C) (Oral)  Resp 16  Wt 164 lb (74.39 kg)  SpO2 98% Wt Readings from Last 3 Encounters:  09/12/14 164 lb (74.39 kg)  05/19/14 157 lb 12 oz (71.555 kg)  04/06/14 156 lb (70.761 kg)     Lab Results  Component Value Date   WBC 6.1 11/23/2013   HGB 12.9* 11/23/2013   HCT 40.3 11/23/2013   PLT 150.0 11/23/2013   GLUCOSE 190* 09/09/2014   CHOL 167 09/09/2014   TRIG 98.0 09/09/2014   HDL 81.70 09/09/2014   LDLCALC 66 09/09/2014   ALT 16 09/09/2014   AST 14 09/09/2014   NA 137 09/09/2014   K 4.1 09/09/2014   CL 103 09/09/2014   CREATININE 0.91 09/09/2014   BUN 19 09/09/2014   CO2 30 09/09/2014   TSH 1.21 11/23/2013   PSA 1.83 01/14/2011   HGBA1C 11.0* 09/09/2014   MICROALBUR 0.7 11/23/2013    Mr Lumbar Spine Wo Contrast  07/17/2014   CLINICAL DATA:  Low back pain. Right  drop foot. Occasional numbness in the right foot. Right foot and leg pain and weakness.  EXAM: MRI LUMBAR SPINE WITHOUT CONTRAST  TECHNIQUE: Multiplanar, multisequence MR imaging of the lumbar spine was performed. No intravenous contrast was administered.  COMPARISON:  08/02/2007  FINDINGS: Straightening of the normal lumbar lordosis is unchanged. There is no significant listhesis. Vertebral body heights are preserved. There is disc desiccation and severe disc space narrowing at L5-S1 with mild narrowing at L4-5. Mild  degenerative marrow changes are present at L5-S1. Subcentimeter T1 hypointense focus in the posterior L3 vertebral body is unchanged and likely benign. Conus medullaris is normal in signal and terminates at L1. Paraspinal soft tissues are unremarkable.  L1-2:  Negative.  L2-3:  Mild disc bulging without stenosis, unchanged.  L3-4: Mild disc bulging and mild facet and ligamentum flavum hypertrophy result in mild right neural foraminal stenosis, minimally increased from prior.  L4-5: Prior right hemilaminectomy. Circumferential disc bulging slightly asymmetric to the right is again seen with suggestion of an increased although small right subarticular disc extrusion which may affect the right L5 nerve root in the lateral recess. A componenet of postoperative change in this region is also possible. There is no spinal stenosis. Circumferential disc bulging results in mild to moderate right greater than left neural foraminal stenosis, unchanged.  L5-S1: Prior right hemilaminectomy. Mild circumferential disc bulging results in moderate left neural foraminal stenosis, unchanged. No spinal stenosis.  IMPRESSION: 1. Possible small right subarticular disc extrusion at L4-5 potentially affecting the right L5 nerve root. Some of this appearance could be postoperative in nature. Postcontrast imaging might be helpful for further differentiation. 2. Unchanged mild to moderate neural foraminal stenosis at L4-5 and moderate left neural foraminal stenosis at L5-S1.   Electronically Signed   By: Logan Bores   On: 07/17/2014 17:43       Assessment & Plan:   AWV/z00.00 - Today patient counseled on age appropriate routine health concerns for screening and prevention, each reviewed and up to date or declined. Immunizations reviewed and up to date or declined. Labs ordered and reviewed. Risk factors for depression reviewed and negative. Hearing function and visual acuity are intact. ADLs screened and addressed as needed. Functional  ability and level of safety reviewed and appropriate. Education, counseling and referrals performed based on assessed risks today. Patient provided with a copy of personalized plan for preventive services.   Problem List Items Addressed This Visit    Diabetes    Uncontrolled at present time History of same for years, controlled with Amaryl until November 2014 OHA changed to metformin 05/2013, titrated up dose to 1000 BID 06/2013 Dose caused significant painless diarrhea with food intake and subsequent unintentional weight loss  DC metformin 02/2014 with improved diarrhea, wt loss reversed - but cbgs uncontrolled On actos, titrated to max, but ineffective on cbg control DC Actos, start januvia - we reviewed potential risk/benefit and possible side effects - pt understands and agrees to same  - erx done Pt will monitor and call if cbg remains >200  Lab Results  Component Value Date   HGBA1C 11.0* 09/09/2014        Relevant Medications   sitaGLIPtin (JANUVIA) tablet   losartan (COZAAR) tablet   ERECTILE DYSFUNCTION    HX same - improved performance using Cialis.  Reports increasing symptoms with "pain" - will refer to uro for further eval of same      Relevant Orders   Ambulatory referral to Urology   Essential hypertension  BP Readings from Last 3 Encounters:  09/12/14 158/84  05/19/14 120/78  04/06/14 122/80   Generally well controlled, reports increased due to "stress" Start low dose ARB now      Relevant Medications   losartan (COZAAR) tablet   RESOLVED: Routine general medical examination at a health care facility - Primary    Other Visit Diagnoses    Diabetes mellitus type 2, uncontrolled, with complications        Relevant Medications    sitaGLIPtin (JANUVIA) tablet    losartan (COZAAR) tablet    Painful erection        Relevant Orders    Ambulatory referral to Urology        Gwendolyn Grant, MD

## 2014-09-12 NOTE — Assessment & Plan Note (Signed)
BP Readings from Last 3 Encounters:  09/12/14 158/84  05/19/14 120/78  04/06/14 122/80   Generally well controlled, reports increased due to "stress" Start low dose ARB now

## 2014-09-12 NOTE — Progress Notes (Signed)
Pre visit review using our clinic review tool, if applicable. No additional management support is needed unless otherwise documented below in the visit note. 

## 2014-09-12 NOTE — Assessment & Plan Note (Signed)
HX same - improved performance using Cialis.  Reports increasing symptoms with "pain" - will refer to uro for further eval of same

## 2014-09-14 ENCOUNTER — Telehealth: Payer: Self-pay | Admitting: Internal Medicine

## 2014-09-14 NOTE — Telephone Encounter (Signed)
emmi mailed  °

## 2014-10-10 ENCOUNTER — Encounter: Payer: Self-pay | Admitting: Internal Medicine

## 2014-10-10 ENCOUNTER — Ambulatory Visit (INDEPENDENT_AMBULATORY_CARE_PROVIDER_SITE_OTHER): Payer: Medicare Other | Admitting: Internal Medicine

## 2014-10-10 ENCOUNTER — Other Ambulatory Visit (INDEPENDENT_AMBULATORY_CARE_PROVIDER_SITE_OTHER): Payer: Medicare Other

## 2014-10-10 VITALS — BP 140/80 | HR 72 | Temp 97.6°F | Resp 16 | Wt 164.0 lb

## 2014-10-10 DIAGNOSIS — E118 Type 2 diabetes mellitus with unspecified complications: Secondary | ICD-10-CM | POA: Diagnosis not present

## 2014-10-10 LAB — HEMOGLOBIN A1C: Hgb A1c MFr Bld: 10.7 % — ABNORMAL HIGH (ref 4.6–6.5)

## 2014-10-10 MED ORDER — GLIMEPIRIDE 2 MG PO TABS: 2.0000 mg | ORAL_TABLET | Freq: Every day | ORAL | Status: DC

## 2014-10-10 MED ORDER — GLUCOSE BLOOD VI STRP: ORAL_STRIP | Status: DC

## 2014-10-10 MED ORDER — LOSARTAN POTASSIUM 25 MG PO TABS: 25.0000 mg | ORAL_TABLET | Freq: Every day | ORAL | Status: DC

## 2014-10-10 MED ORDER — SIMVASTATIN 20 MG PO TABS: 20.0000 mg | ORAL_TABLET | Freq: Every day | ORAL | Status: DC

## 2014-10-10 MED ORDER — FREESTYLE UNISTICK II LANCETS MISC: Status: DC

## 2014-10-10 MED ORDER — SITAGLIPTIN PHOSPHATE 100 MG PO TABS: 100.0000 mg | ORAL_TABLET | Freq: Every day | ORAL | Status: DC

## 2014-10-10 NOTE — Progress Notes (Signed)
Subjective:    Patient ID: Alex Massey, male    DOB: Feb 22, 1941, 74 y.o.   MRN: [id number]  HPI  Patient here for follow-up diabetes. Since adding Januvia, CBGs have improved but still running greater than 200 fasting most mornings. Denies hypoglycemia  Past Medical History  Diagnosis Date  . DIABETES MELLITUS, TYPE II   . ERECTILE DYSFUNCTION   . FOOT DROP, RIGHT   . HYPERLIPIDEMIA   . NEPHROLITHIASIS, HX OF     Review of Systems  Constitutional: Negative for fever and fatigue.  Respiratory: Negative for cough and shortness of breath.   Cardiovascular: Negative for chest pain and leg swelling.       Objective:    Physical Exam  Constitutional: He appears well-developed and well-nourished. No distress.  Cardiovascular: Normal rate, regular rhythm and normal heart sounds.   No murmur heard. Pulmonary/Chest: Effort normal and breath sounds normal. No respiratory distress.    BP 140/80 mmHg  Pulse 72  Temp(Src) 97.6 F (36.4 C) (Oral)  Resp 16  Wt 164 lb (74.39 kg)  SpO2 98% Wt Readings from Last 3 Encounters:  10/10/14 164 lb (74.39 kg)  09/12/14 164 lb (74.39 kg)  05/19/14 157 lb 12 oz (71.555 kg)     Lab Results  Component Value Date   WBC 6.1 11/23/2013   HGB 12.9* 11/23/2013   HCT 40.3 11/23/2013   PLT 150.0 11/23/2013   GLUCOSE 190* 09/09/2014   CHOL 167 09/09/2014   TRIG 98.0 09/09/2014   HDL 81.70 09/09/2014   LDLCALC 66 09/09/2014   ALT 16 09/09/2014   AST 14 09/09/2014   NA 137 09/09/2014   K 4.1 09/09/2014   CL 103 09/09/2014   CREATININE 0.91 09/09/2014   BUN 19 09/09/2014   CO2 30 09/09/2014   TSH 1.21 11/23/2013   PSA 1.83 01/14/2011   HGBA1C 11.0* 09/09/2014   MICROALBUR 0.7 11/23/2013    Mr Lumbar Spine Wo Contrast  07/17/2014   CLINICAL DATA:  Low back pain. Right drop foot. Occasional numbness in the right foot. Right foot and leg pain and weakness.  EXAM: MRI LUMBAR SPINE WITHOUT CONTRAST  TECHNIQUE: Multiplanar,  multisequence MR imaging of the lumbar spine was performed. No intravenous contrast was administered.  COMPARISON:  08/02/2007  FINDINGS: Straightening of the normal lumbar lordosis is unchanged. There is no significant listhesis. Vertebral body heights are preserved. There is disc desiccation and severe disc space narrowing at L5-S1 with mild narrowing at L4-5. Mild degenerative marrow changes are present at L5-S1. Subcentimeter T1 hypointense focus in the posterior L3 vertebral body is unchanged and likely benign. Conus medullaris is normal in signal and terminates at L1. Paraspinal soft tissues are unremarkable.  L1-2:  Negative.  L2-3:  Mild disc bulging without stenosis, unchanged.  L3-4: Mild disc bulging and mild facet and ligamentum flavum hypertrophy result in mild right neural foraminal stenosis, minimally increased from prior.  L4-5: Prior right hemilaminectomy. Circumferential disc bulging slightly asymmetric to the right is again seen with suggestion of an increased although small right subarticular disc extrusion which may affect the right L5 nerve root in the lateral recess. A componenet of postoperative change in this region is also possible. There is no spinal stenosis. Circumferential disc bulging results in mild to moderate right greater than left neural foraminal stenosis, unchanged.  L5-S1: Prior right hemilaminectomy. Mild circumferential disc bulging results in moderate left neural foraminal stenosis, unchanged. No spinal stenosis.  IMPRESSION: 1. Possible small right subarticular  disc extrusion at L4-5 potentially affecting the right L5 nerve root. Some of this appearance could be postoperative in nature. Postcontrast imaging might be helpful for further differentiation. 2. Unchanged mild to moderate neural foraminal stenosis at L4-5 and moderate left neural foraminal stenosis at L5-S1.   Electronically Signed   By: Logan Bores   On: 07/17/2014 17:43       Assessment & Plan:   Problem  List Items Addressed This Visit    Diabetes - Primary    Uncontrolled at present time History of same for years, controlled with Amaryl until November 2014 OHA changed to metformin 05/2013, titrated up dose to 1000 BID 06/2013 Dose caused significant painless diarrhea with food intake and subsequent unintentional weight loss  DC metformin 02/2014 with improved diarrhea, wt loss reversed - but cbgs uncontrolled On actos, titrated to max, but ineffective on cbg control DC Actos and start januvia 09/2014 - cbs still 170-220s fasting Add back low dose Amaryl as previously with good control on same - we reviewed potential risk/benefit and possible side effects - pt understands and agrees to same  - erx done Pt will monitor and call if cbg remains >200  Lab Results  Component Value Date   HGBA1C 11.0* 09/09/2014        Relevant Medications   losartan (COZAAR) tablet   simvastatin (ZOCOR) tablet   sitaGLIPtin (JANUVIA) tablet   glimepiride (AMARYL) tablet   Other Relevant Orders   Hemoglobin A1c       Gwendolyn Grant, MD

## 2014-10-10 NOTE — Progress Notes (Signed)
Pre visit review using our clinic review tool, if applicable. No additional management support is needed unless otherwise documented below in the visit note. 

## 2014-10-10 NOTE — Patient Instructions (Addendum)
It was good to see you today.  We have reviewed your prior records including labs and tests today  Test(s) ordered today. Your results will be released to [place] (or called to you) after review, usually within 72hours after test completion. If any changes need to be made, you will be notified at that same time.  Medications reviewed and updated Add low-dose Amaryl 2 mg daily to ongoing Januvia once daily No other changes recommended at this time.  Your prescription(s) and refills have been submitted to your pharmacy. Please take as directed and contact our office if you believe you are having problem(s) with the medication(s).  Please schedule followup in 3-4 months for diabetes mellitus check, call sooner if problems.  Diabetes and Standards of Medical Care Diabetes is complicated. You may find that your diabetes team includes a dietitian, nurse, diabetes educator, eye doctor, and more. To help everyone know what is going on and to help you get the care you deserve, the following schedule of care was developed to help keep you on track. Below are the tests, exams, vaccines, medicines, education, and plans you will need. HbA1c test This test shows how well you have controlled your glucose over the past 2-3 months. It is used to see if your diabetes management plan needs to be adjusted.   It is performed at least 2 times a year if you are meeting treatment goals.  It is performed 4 times a year if therapy has changed or if you are not meeting treatment goals. Blood pressure test  This test is performed at every routine medical visit. The goal is less than 140/90 mm Hg for most people, but 130/80 mm Hg in some cases. Ask your health care provider about your goal. Dental exam  Follow up with the dentist regularly. Eye exam  If you are diagnosed with type 1 diabetes as a child, get an exam upon reaching the age of 42 years or older and have had diabetes for 3-5 years. Yearly eye exams are  recommended after that initial eye exam.  If you are diagnosed with type 1 diabetes as an adult, get an exam within 5 years of diagnosis and then yearly.  If you are diagnosed with type 2 diabetes, get an exam as soon as possible after the diagnosis and then yearly. Foot care exam  Visual foot exams are performed at every routine medical visit. The exams check for cuts, injuries, or other problems with the feet.  A comprehensive foot exam should be done yearly. This includes visual inspection as well as assessing foot pulses and testing for loss of sensation.  Check your feet nightly for cuts, injuries, or other problems with your feet. Tell your health care provider if anything is not healing. Kidney function test (urine microalbumin)  This test is performed once a year.  Type 1 diabetes: The first test is performed 5 years after diagnosis.  Type 2 diabetes: The first test is performed at the time of diagnosis.  A serum creatinine and estimated glomerular filtration rate (eGFR) test is done once a year to assess the level of chronic kidney disease (CKD), if present. Lipid profile (cholesterol, HDL, LDL, triglycerides)  Performed every 5 years for most people.  The goal for LDL is less than 100 mg/dL. If you are at high risk, the goal is less than 70 mg/dL.  The goal for HDL is 40 mg/dL-50 mg/dL for men and 50 mg/dL-60 mg/dL for women. An HDL cholesterol of 60  mg/dL or higher gives some protection against heart disease.  The goal for triglycerides is less than 150 mg/dL. Influenza vaccine, pneumococcal vaccine, and hepatitis B vaccine  The influenza vaccine is recommended yearly.  It is recommended that people with diabetes who are over 2 years old get the pneumonia vaccine. In some cases, two separate shots may be given. Ask your health care provider if your pneumonia vaccination is up to date.  The hepatitis B vaccine is also recommended for adults with diabetes. Diabetes  self-management education  Education is recommended at diagnosis and ongoing as needed. Treatment plan  Your treatment plan is reviewed at every medical visit. Document Released: 04/21/2009 Document Revised: 11/08/2013 Document Reviewed: 11/24/2012 [facility] Patient Information 2015 [place], [place]. This information is not intended to replace advice given to you by your health care provider. Make sure you discuss any questions you have with your health care provider.

## 2014-10-10 NOTE — Assessment & Plan Note (Signed)
Uncontrolled at present time History of same for years, controlled with Amaryl until November 2014 OHA changed to metformin 05/2013, titrated up dose to 1000 BID 06/2013 Dose caused significant painless diarrhea with food intake and subsequent unintentional weight loss  DC metformin 02/2014 with improved diarrhea, wt loss reversed - but cbgs uncontrolled On actos, titrated to max, but ineffective on cbg control DC Actos and start januvia 09/2014 - cbs still 170-220s fasting Add back low dose Amaryl as previously with good control on same - we reviewed potential risk/benefit and possible side effects - pt understands and agrees to same  - erx done Pt will monitor and call if cbg remains >200  Lab Results  Component Value Date   HGBA1C 11.0* 09/09/2014

## 2014-11-03 ENCOUNTER — Encounter: Payer: Self-pay | Admitting: Internal Medicine

## 2014-11-09 ENCOUNTER — Encounter: Payer: Self-pay | Admitting: Internal Medicine

## 2014-11-17 ENCOUNTER — Other Ambulatory Visit: Payer: Self-pay | Admitting: Internal Medicine

## 2014-11-17 NOTE — Telephone Encounter (Signed)
Faxed script back to [facility].../lmb 

## 2014-11-17 NOTE — Telephone Encounter (Signed)
MD out of office pls advise.../lmb 

## 2015-01-06 ENCOUNTER — Ambulatory Visit (AMBULATORY_SURGERY_CENTER): Payer: Self-pay

## 2015-01-06 VITALS — Ht 66.0 in | Wt 165.8 lb

## 2015-01-06 DIAGNOSIS — Z860100 Personal history of colon polyps, unspecified: Secondary | ICD-10-CM

## 2015-01-06 MED ORDER — SUPREP BOWEL PREP KIT 17.5-3.13-1.6 GM/177ML PO SOLN: 1.0000 | Freq: Once | ORAL | Status: DC

## 2015-01-06 NOTE — Progress Notes (Signed)
No allergies to eggs or soy No diet/weight loss meds No home oxygen No past problems with anesthesia EXCEPT was told by anesthesia that his heart stopped beating during back surgery BUT when he asked his surgeon about it at follow-up, surgeon said, "Well she never told me anything about it".    No email  "I'm computer illiterate"

## 2015-01-20 ENCOUNTER — Ambulatory Visit (AMBULATORY_SURGERY_CENTER): Payer: Medicare Other | Admitting: Internal Medicine

## 2015-01-20 ENCOUNTER — Encounter: Payer: Self-pay | Admitting: Internal Medicine

## 2015-01-20 VITALS — BP 118/87 | HR 64 | Temp 97.1°F | Resp 17 | Ht 66.0 in | Wt 165.0 lb

## 2015-01-20 DIAGNOSIS — Z86010 Personal history of colonic polyps: Secondary | ICD-10-CM

## 2015-01-20 DIAGNOSIS — D129 Benign neoplasm of anus and anal canal: Secondary | ICD-10-CM

## 2015-01-20 DIAGNOSIS — D128 Benign neoplasm of rectum: Secondary | ICD-10-CM

## 2015-01-20 LAB — GLUCOSE, CAPILLARY
GLUCOSE-CAPILLARY: 137 mg/dL — AB (ref 65–99)
Glucose-Capillary: 106 mg/dL — ABNORMAL HIGH (ref 65–99)

## 2015-01-20 MED ORDER — SODIUM CHLORIDE 0.9 % IV SOLN: 500.0000 mL | INTRAVENOUS | Status: DC

## 2015-01-20 NOTE — Progress Notes (Signed)
Transferred to recovery room. A/O x3, pleased with MAC.  VSS.  Report to Wendy, RN. 

## 2015-01-20 NOTE — Progress Notes (Signed)
Called to room to assist during endoscopic procedure.  Patient ID and intended procedure confirmed with present staff. Received instructions for my participation in the procedure from the performing physician.  

## 2015-01-20 NOTE — Patient Instructions (Signed)

## 2015-01-20 NOTE — Op Note (Signed)
[place]  [address] [place], [id number]   COLONOSCOPY PROCEDURE REPORT  PATIENT: Alex Massey, Alex Massey  MR#: [id number] BIRTHDATE: 1941-02-28 , 74  yrs. old GENDER: male ENDOSCOPIST: Lafayette Dragon, MD REFERRED BY:Dr Asa Lente PROCEDURE DATE:  01/20/2015 PROCEDURE:   Colonoscopy, screening and Colonoscopy with snare polypectomy First Screening Colonoscopy - Avg.  risk and is 50 yrs.  old or older - No.  Prior Negative Screening - Now for repeat screening. 10 or more years since last screening  History of Adenoma - Now for follow-up colonoscopy & has been > or = to 3 yrs.  N/A  Polyps removed today? Yes ASA CLASS:   Class II INDICATIONS:Screening for colonic neoplasia, FH Colon or Rectal Adenocarcinoma, and brother with colon cancer.  Last colonoscopy July 2006 showed small polyps.  Consisted of lymphoid aggregate. MEDICATIONS: Monitored anesthesia care and Propofol 200 mg IV  DESCRIPTION OF PROCEDURE:   After the risks benefits and alternatives of the procedure were thoroughly explained, informed consent was obtained.  The digital rectal exam revealed no abnormalities of the rectum.   The LB [id number]  endoscope was introduced through the anus and advanced to the cecum, which was identified by both the appendix and ileocecal valve. No adverse events experienced.   The quality of the prep was good.  (MoviPrep was used)  The instrument was then slowly withdrawn as the colon was fully examined. Estimated blood loss is zero unless otherwise noted in this procedure report.      COLON FINDINGS: There was severe diverticulosis noted throughout the entire examined colon with associated colonic narrowing, tortuosity and angulation.   A sessile polyp measuring 5 mm in size was found in the rectum.  A polypectomy was performed with a cold snare.  The resection was complete, the polyp tissue was completely retrieved and sent to histology.   Moderate sized  internal Grade I hemorrhoids were found.  Retroflexed views revealed no abnormalities. The time to cecum = 5.56 Withdrawal time = 10.17 The scope was withdrawn and the procedure completed. COMPLICATIONS: There were no immediate complications.  ENDOSCOPIC IMPRESSION: 1.   There was severe diverticulosis noted throughout the entire examined colon 2.   Sessile polyp was found in the rectum; polypectomy was performed with a cold snare 3.   Moderate sized internal Grade I hemorrhoids  RECOMMENDATIONS: 1.  Await pathology results 2.  High-fiber diet Recall colonoscopy in 5 years if pt remains in good helath  eSigned:  Lafayette Dragon, MD 01/20/2015 10:09 AM   cc:   PATIENT NAME:  Alex Massey, Alex Massey MR#: [id number]

## 2015-01-23 ENCOUNTER — Telehealth: Payer: Self-pay | Admitting: *Deleted

## 2015-01-23 NOTE — Telephone Encounter (Signed)
  Follow up Call-  Call back number 01/20/2015  Post procedure Call Back phone  # [phone]  Permission to leave phone message Yes     Patient questions:  Message left to call us if necessary.

## 2015-01-24 ENCOUNTER — Encounter: Payer: Self-pay | Admitting: Internal Medicine

## 2015-02-01 ENCOUNTER — Encounter: Payer: Self-pay | Admitting: Internal Medicine

## 2015-02-01 ENCOUNTER — Other Ambulatory Visit (INDEPENDENT_AMBULATORY_CARE_PROVIDER_SITE_OTHER): Payer: Medicare Other

## 2015-02-01 ENCOUNTER — Ambulatory Visit (INDEPENDENT_AMBULATORY_CARE_PROVIDER_SITE_OTHER): Payer: Medicare Other | Admitting: Internal Medicine

## 2015-02-01 VITALS — BP 132/72 | HR 64 | Temp 97.7°F | Ht 66.0 in | Wt 162.4 lb

## 2015-02-01 DIAGNOSIS — E785 Hyperlipidemia, unspecified: Secondary | ICD-10-CM | POA: Diagnosis not present

## 2015-02-01 DIAGNOSIS — Z0000 Encounter for general adult medical examination without abnormal findings: Secondary | ICD-10-CM

## 2015-02-01 DIAGNOSIS — N400 Enlarged prostate without lower urinary tract symptoms: Secondary | ICD-10-CM

## 2015-02-01 DIAGNOSIS — I10 Essential (primary) hypertension: Secondary | ICD-10-CM | POA: Diagnosis not present

## 2015-02-01 DIAGNOSIS — E118 Type 2 diabetes mellitus with unspecified complications: Secondary | ICD-10-CM | POA: Diagnosis not present

## 2015-02-01 LAB — HEPATIC FUNCTION PANEL
ALBUMIN: 4.6 g/dL (ref 3.5–5.2)
ALK PHOS: 82 U/L (ref 39–117)
ALT: 21 U/L (ref 0–53)
AST: 17 U/L (ref 0–37)
BILIRUBIN DIRECT: 0.1 mg/dL (ref 0.0–0.3)
TOTAL PROTEIN: 7.1 g/dL (ref 6.0–8.3)
Total Bilirubin: 0.7 mg/dL (ref 0.2–1.2)

## 2015-02-01 LAB — BASIC METABOLIC PANEL
BUN: 11 mg/dL (ref 6–23)
CO2: 31 mEq/L (ref 19–32)
Calcium: 10.3 mg/dL (ref 8.4–10.5)
Chloride: 103 mEq/L (ref 96–112)
Creatinine, Ser: 0.91 mg/dL (ref 0.40–1.50)
GFR: 105.02 mL/min (ref >60.00)
GLUCOSE: 140 mg/dL — AB (ref 70–99)
Potassium: 4.3 mEq/L (ref 3.5–5.1)
Sodium: 141 mEq/L (ref 135–145)

## 2015-02-01 LAB — LIPID PANEL
CHOL/HDL RATIO: 3
Cholesterol: 172 mg/dL (ref 0–200)
HDL: 62 mg/dL (ref >39.00)
LDL Cholesterol: 80 mg/dL (ref 0–99)
NonHDL: 110
TRIGLYCERIDES: 148 mg/dL (ref 0.0–149.0)
VLDL: 29.6 mg/dL (ref 0.0–40.0)

## 2015-02-01 LAB — CBC WITH DIFFERENTIAL/PLATELET
BASOS ABS: 0 10*3/uL (ref 0.0–0.1)
BASOS PCT: 0.7 % (ref 0.0–3.0)
EOS ABS: 0.2 10*3/uL (ref 0.0–0.7)
Eosinophils Relative: 3.3 % (ref 0.0–5.0)
HEMATOCRIT: 42.6 % (ref 39.0–52.0)
HEMOGLOBIN: 14 g/dL (ref 13.0–17.0)
Lymphocytes Relative: 41.9 % (ref 12.0–46.0)
Lymphs Abs: 2 10*3/uL (ref 0.7–4.0)
MCHC: 32.9 g/dL (ref 30.0–36.0)
MCV: 78.8 fl (ref 78.0–100.0)
MONO ABS: 0.5 10*3/uL (ref 0.1–1.0)
MONOS PCT: 9.9 % (ref 3.0–12.0)
NEUTROS PCT: 44.2 % (ref 43.0–77.0)
Neutro Abs: 2.2 10*3/uL (ref 1.4–7.7)
PLATELETS: 154 10*3/uL (ref 150.0–400.0)
RBC: 5.41 Mil/uL (ref 4.22–5.81)
RDW: 14.3 % (ref 11.5–15.5)
WBC: 4.9 10*3/uL (ref 4.0–10.5)

## 2015-02-01 LAB — URINALYSIS, ROUTINE W REFLEX MICROSCOPIC
Bilirubin Urine: NEGATIVE
Hgb urine dipstick: NEGATIVE
Ketones, ur: NEGATIVE
LEUKOCYTES UA: NEGATIVE
Nitrite: NEGATIVE
RBC / HPF: NONE SEEN (ref 0–?)
SPECIFIC GRAVITY, URINE: 1.025 (ref 1.000–1.030)
TOTAL PROTEIN, URINE-UPE24: NEGATIVE
Urine Glucose: NEGATIVE
Urobilinogen, UA: 0.2 (ref 0.0–1.0)
WBC UA: NONE SEEN (ref 0–?)
pH: 6 (ref 5.0–8.0)

## 2015-02-01 LAB — TSH: TSH: 1.04 u[IU]/mL (ref 0.35–4.50)

## 2015-02-01 LAB — MICROALBUMIN / CREATININE URINE RATIO
CREATININE, U: 168.6 mg/dL
MICROALB UR: 1 mg/dL (ref 0.0–1.9)
MICROALB/CREAT RATIO: 0.6 mg/g (ref 0.0–30.0)

## 2015-02-01 LAB — PSA: PSA: 4.03 ng/mL — ABNORMAL HIGH (ref 0.10–4.00)

## 2015-02-01 LAB — HEMOGLOBIN A1C: Hgb A1c MFr Bld: 7 % — ABNORMAL HIGH (ref 4.6–6.5)

## 2015-02-01 MED ORDER — LOSARTAN POTASSIUM 25 MG PO TABS: 25.0000 mg | ORAL_TABLET | Freq: Every day | ORAL | Status: DC

## 2015-02-01 MED ORDER — SITAGLIPTIN PHOSPHATE 100 MG PO TABS: 100.0000 mg | ORAL_TABLET | Freq: Every day | ORAL | Status: DC

## 2015-02-01 MED ORDER — GLIMEPIRIDE 2 MG PO TABS: 2.0000 mg | ORAL_TABLET | Freq: Every day | ORAL | Status: DC

## 2015-02-01 MED ORDER — ASPIRIN EC 81 MG PO TBEC: 81.0000 mg | DELAYED_RELEASE_TABLET | Freq: Every day | ORAL | Status: AC

## 2015-02-01 MED ORDER — SILDENAFIL CITRATE 100 MG PO TABS: 50.0000 mg | ORAL_TABLET | Freq: Every day | ORAL | Status: DC | PRN

## 2015-02-01 MED ORDER — SIMVASTATIN 20 MG PO TABS: 20.0000 mg | ORAL_TABLET | Freq: Every day | ORAL | Status: DC

## 2015-02-01 NOTE — Patient Instructions (Addendum)
It was good to see you today.  We have reviewed your prior records including labs and tests today  Health Maintenance reviewed - all recommended immunizations and age-appropriate screenings are up-to-date.  Test(s) ordered today. Your results will be released to [place] (or called to you) after review, usually within 72hours after test completion. If any changes need to be made, you will be notified at that same time.  Medications reviewed and updated, no changes recommended at this time.  Please schedule followup in 6 months for semiannual diabetes exam and labs, call sooner if problems.  Health Maintenance A healthy lifestyle and preventative care can promote health and wellness.  Maintain regular health, dental, and eye exams.  Eat a healthy diet. Foods like vegetables, fruits, whole grains, low-fat dairy products, and lean protein foods contain the nutrients you need and are low in calories. Decrease your intake of foods high in solid fats, added sugars, and salt. Get information about a proper diet from your health care provider, if necessary.  Regular physical exercise is one of the most important things you can do for your health. Most adults should get at least 150 minutes of moderate-intensity exercise (any activity that increases your heart rate and causes you to sweat) each week. In addition, most adults need muscle-strengthening exercises on 2 or more days a week.   Maintain a healthy weight. The body mass index (BMI) is a screening tool to identify possible weight problems. It provides an estimate of body fat based on height and weight. Your health care provider can find your BMI and can help you achieve or maintain a healthy weight. For males 20 years and older:  A BMI below 18.5 is considered underweight.  A BMI of 18.5 to 24.9 is normal.  A BMI of 25 to 29.9 is considered overweight.  A BMI of 30 and above is considered obese.  Maintain normal blood lipids and  cholesterol by exercising and minimizing your intake of saturated fat. Eat a balanced diet with plenty of fruits and vegetables. Blood tests for lipids and cholesterol should begin at age 41 and be repeated every 5 years. If your lipid or cholesterol levels are high, you are over age 30, or you are at high risk for heart disease, you may need your cholesterol levels checked more frequently.Ongoing high lipid and cholesterol levels should be treated with medicines if diet and exercise are not working.  If you smoke, find out from your health care provider how to quit. If you do not use tobacco, do not start.  Lung cancer screening is recommended for adults aged 14-80 years who are at high risk for developing lung cancer because of a history of smoking. A yearly low-dose CT scan of the lungs is recommended for people who have at least a 30-pack-year history of smoking and are current smokers or have quit within the past 15 years. A pack year of smoking is smoking an average of 1 pack of cigarettes a day for 1 year (for example, a 30-pack-year history of smoking could mean smoking 1 pack a day for 30 years or 2 packs a day for 15 years). Yearly screening should continue until the smoker has stopped smoking for at least 15 years. Yearly screening should be stopped for people who develop a health problem that would prevent them from having lung cancer treatment.  If you choose to drink alcohol, do not have more than 2 drinks per day. One drink is considered to be 12  oz (360 mL) of beer, 5 oz (150 mL) of wine, or 1.5 oz (45 mL) of liquor.  Avoid the use of street drugs. Do not share needles with anyone. Ask for help if you need support or instructions about stopping the use of drugs.  High blood pressure causes heart disease and increases the risk of stroke. Blood pressure should be checked at least every 1-2 years. Ongoing high blood pressure should be treated with medicines if weight loss and exercise are not  effective.  If you are 17-82 years old, ask your health care provider if you should take aspirin to prevent heart disease.  Diabetes screening involves taking a blood sample to check your fasting blood sugar level. This should be done once every 3 years after age 55 if you are at a normal weight and without risk factors for diabetes. Testing should be considered at a younger age or be carried out more frequently if you are overweight and have at least 1 risk factor for diabetes.  Colorectal cancer can be detected and often prevented. Most routine colorectal cancer screening begins at the age of 17 and continues through age 80. However, your health care provider may recommend screening at an earlier age if you have risk factors for colon cancer. On a yearly basis, your health care provider may provide home test kits to check for hidden blood in the stool. A small camera at the end of a tube may be used to directly examine the colon (sigmoidoscopy or colonoscopy) to detect the earliest forms of colorectal cancer. Talk to your health care provider about this at age 55 when routine screening begins. A direct exam of the colon should be repeated every 5-10 years through age 54, unless early forms of precancerous polyps or small growths are found.  People who are at an increased risk for hepatitis B should be screened for this virus. You are considered at high risk for hepatitis B if:  You were born in a country where hepatitis B occurs often. Talk with your health care provider about which countries are considered high risk.  Your parents were born in a high-risk country and you have not received a shot to protect against hepatitis B (hepatitis B vaccine).  You have HIV or AIDS.  You use needles to inject street drugs.  You live with, or have sex with, someone who has hepatitis B.  You are a man who has sex with other men (MSM).  You get hemodialysis treatment.  You take certain medicines for  conditions like cancer, organ transplantation, and autoimmune conditions.  Hepatitis C blood testing is recommended for all people born from [birth year] through 1965 and any individual with known risk factors for hepatitis C.  Healthy men should no longer receive prostate-specific antigen (PSA) blood tests as part of routine cancer screening. Talk to your health care provider about prostate cancer screening.  Testicular cancer screening is not recommended for adolescents or adult males who have no symptoms. Screening includes self-exam, a health care provider exam, and other screening tests. Consult with your health care provider about any symptoms you have or any concerns you have about testicular cancer.  Practice safe sex. Use condoms and avoid high-risk sexual practices to reduce the spread of sexually transmitted infections (STIs).  You should be screened for STIs, including gonorrhea and chlamydia if:  You are sexually active and are younger than 24 years.  You are older than 24 years, and your health care provider tells  you that you are at risk for this type of infection.  Your sexual activity has changed since you were last screened, and you are at an increased risk for chlamydia or gonorrhea. Ask your health care provider if you are at risk.  If you are at risk of being infected with HIV, it is recommended that you take a prescription medicine daily to prevent HIV infection. This is called pre-exposure prophylaxis (PrEP). You are considered at risk if:  You are a man who has sex with other men (MSM).  You are a heterosexual man who is sexually active with multiple partners.  You take drugs by injection.  You are sexually active with a partner who has HIV.  Talk with your health care provider about whether you are at high risk of being infected with HIV. If you choose to begin PrEP, you should first be tested for HIV. You should then be tested every 3 months for as long as you are taking  PrEP.  Use sunscreen. Apply sunscreen liberally and repeatedly throughout the day. You should seek shade when your shadow is shorter than you. Protect yourself by wearing long sleeves, pants, a wide-brimmed hat, and sunglasses year round whenever you are outdoors.  Tell your health care provider of new moles or changes in moles, especially if there is a change in shape or color. Also, tell your health care provider if a mole is larger than the size of a pencil eraser.  A one-time screening for abdominal aortic aneurysm (AAA) and surgical repair of large AAAs by ultrasound is recommended for men aged 4-75 years who are current or former smokers.  Stay current with your vaccines (immunizations). Document Released: 12/21/2007 Document Revised: 06/29/2013 Document Reviewed: 11/19/2010 [facility] Patient Information 2015 [place], [place]. This information is not intended to replace advice given to you by your health care provider. Make sure you discuss any questions you have with your health care provider.

## 2015-02-01 NOTE — Assessment & Plan Note (Signed)
Uncontrolled at present time History of same for years, controlled with Amaryl until November 2014 OHA changed to metformin 05/2013, titrated up dose to 1000 BID 06/2013 Dose caused significant painless diarrhea with food intake and subsequent unintentional weight loss  DC metformin 02/2014 with improved diarrhea, wt loss reversed - but cbgs uncontrolled On actos, titrated to max, but ineffective on cbg control DC Actos and start januvia 09/2014 - cbs still 170-220s fasting Add back low dose Amaryl in 10/2014 as previously with good control on same - Reports improved cbgs - recheck a1c today Due for optho f/u next week (02/06/15 - [place])  Lab Results  Component Value Date   HGBA1C 10.7* 10/10/2014

## 2015-02-01 NOTE — Progress Notes (Signed)
Pre visit review using our clinic review tool, if applicable. No additional management support is needed unless otherwise documented below in the visit note. 

## 2015-02-01 NOTE — Addendum Note (Signed)
Addended by: Lowella Dandy on: 02/01/2015 10:17 AM   Modules accepted: Orders, SmartSet

## 2015-02-01 NOTE — Assessment & Plan Note (Signed)
On statin Check lipids annually and titrate as needed

## 2015-02-01 NOTE — Progress Notes (Signed)
Subjective:    Patient ID: ELL TISO, male    DOB: Jan 11, 1941, 74 y.o.   MRN: [id number]  HPI   Here for medicare wellness  Diet: heart healthy, carb modified Physical activity: sedentary Depression/mood screen: negative Hearing: intact to whispered voice Visual acuity: grossly normal, performs annual eye exam  ADLs: capable Fall risk: none Home safety: good Cognitive evaluation: intact to orientation, naming, recall and repetition EOL planning: adv directives, full code/ I agree  I have personally reviewed and have noted 1. The patient's medical and social history 2. Their use of alcohol, tobacco or illicit drugs 3. Their current medications and supplements 4. The patient's functional ability including ADL's, fall risks, home safety risks and hearing or visual impairment. 5. Diet and physical activities 6. Evidence for depression or mood disorders  Reviewed chronic medical issues, interval events and current concerns   Past Medical History  Diagnosis Date  . DIABETES MELLITUS, TYPE II   . ERECTILE DYSFUNCTION   . FOOT DROP, RIGHT     s/p ESI x 3 at [facility] in 2009 and 2016  . HYPERLIPIDEMIA   . NEPHROLITHIASIS, HX OF    Family History  Problem Relation Age of Onset  . Diabetes Father   . Colon cancer Brother 40   History  Substance Use Topics  . Smoking status: Former Smoker    Types: Cigarettes    Quit date: 07/09/2004  . Smokeless tobacco: Never Used  . Alcohol Use: 1.8 oz/week    3 Glasses of wine per week    Review of Systems  Constitutional: Negative for fever, activity change, appetite change, fatigue and unexpected weight change.  Respiratory: Negative for cough, chest tightness, shortness of breath and wheezing.   Cardiovascular: Negative for chest pain, palpitations and leg swelling.  Genitourinary: Negative for dysuria, hematuria, decreased urine volume, scrotal swelling and testicular pain. Difficulty urinating: occasional  hesitancy with initiation of stream.  Neurological: Negative for dizziness, weakness and headaches.  Psychiatric/Behavioral: Negative for dysphoric mood. The patient is not nervous/anxious.   All other systems reviewed and are negative.      Objective:    Physical Exam  Constitutional: He appears well-developed and well-nourished. No distress.  Cardiovascular: Normal rate, regular rhythm and normal heart sounds.   No murmur heard. Pulmonary/Chest: Effort normal and breath sounds normal. No respiratory distress.  Musculoskeletal:  Wears brace on RLE with activity    BP 132/72 mmHg  Pulse 64  Temp(Src) 97.7 F (36.5 C) (Oral)  Ht 5\' 6"  (1.676 m)  Wt 162 lb 6 oz (73.653 kg)  BMI 26.22 kg/m2  SpO2 96% Wt Readings from Last 3 Encounters:  02/01/15 162 lb 6 oz (73.653 kg)  01/20/15 165 lb (74.844 kg)  01/06/15 165 lb 12.8 oz (75.206 kg)     Lab Results  Component Value Date   WBC 6.1 11/23/2013   HGB 12.9* 11/23/2013   HCT 40.3 11/23/2013   PLT 150.0 11/23/2013   GLUCOSE 190* 09/09/2014   CHOL 167 09/09/2014   TRIG 98.0 09/09/2014   HDL 81.70 09/09/2014   LDLCALC 66 09/09/2014   ALT 16 09/09/2014   AST 14 09/09/2014   NA 137 09/09/2014   K 4.1 09/09/2014   CL 103 09/09/2014   CREATININE 0.91 09/09/2014   BUN 19 09/09/2014   CO2 30 09/09/2014   TSH 1.21 11/23/2013   PSA 1.83 01/14/2011   HGBA1C 10.7* 10/10/2014   MICROALBUR 0.7 11/23/2013    Mr Lumbar Spine  Wo Contrast  07/17/2014   CLINICAL DATA:  Low back pain. Right drop foot. Occasional numbness in the right foot. Right foot and leg pain and weakness.  EXAM: MRI LUMBAR SPINE WITHOUT CONTRAST  TECHNIQUE: Multiplanar, multisequence MR imaging of the lumbar spine was performed. No intravenous contrast was administered.  COMPARISON:  08/02/2007  FINDINGS: Straightening of the normal lumbar lordosis is unchanged. There is no significant listhesis. Vertebral body heights are preserved. There is disc desiccation and  severe disc space narrowing at L5-S1 with mild narrowing at L4-5. Mild degenerative marrow changes are present at L5-S1. Subcentimeter T1 hypointense focus in the posterior L3 vertebral body is unchanged and likely benign. Conus medullaris is normal in signal and terminates at L1. Paraspinal soft tissues are unremarkable.  L1-2:  Negative.  L2-3:  Mild disc bulging without stenosis, unchanged.  L3-4: Mild disc bulging and mild facet and ligamentum flavum hypertrophy result in mild right neural foraminal stenosis, minimally increased from prior.  L4-5: Prior right hemilaminectomy. Circumferential disc bulging slightly asymmetric to the right is again seen with suggestion of an increased although small right subarticular disc extrusion which may affect the right L5 nerve root in the lateral recess. A componenet of postoperative change in this region is also possible. There is no spinal stenosis. Circumferential disc bulging results in mild to moderate right greater than left neural foraminal stenosis, unchanged.  L5-S1: Prior right hemilaminectomy. Mild circumferential disc bulging results in moderate left neural foraminal stenosis, unchanged. No spinal stenosis.  IMPRESSION: 1. Possible small right subarticular disc extrusion at L4-5 potentially affecting the right L5 nerve root. Some of this appearance could be postoperative in nature. Postcontrast imaging might be helpful for further differentiation. 2. Unchanged mild to moderate neural foraminal stenosis at L4-5 and moderate left neural foraminal stenosis at L5-S1.   Electronically Signed   By: Logan Bores   On: 07/17/2014 17:43       Assessment & Plan:   AWV/z00.00 - Today patient counseled on age appropriate routine health concerns for screening and prevention, each reviewed and up to date or declined. Immunizations reviewed and up to date or declined. Labs reviewed. Risk factors for depression reviewed and negative. Hearing function and visual acuity are  intact. ADLs screened and addressed as needed. Functional ability and level of safety reviewed and appropriate. Education, counseling and referrals performed based on assessed risks today. Patient provided with a copy of personalized plan for preventive services.  Problem List Items Addressed This Visit    BPH without obstruction/lower urinary tract symptoms   Relevant Orders   PSA   Diabetes    Uncontrolled at present time History of same for years, controlled with Amaryl until November 2014 [facility] changed to metformin 05/2013, titrated up dose to 1000 BID 06/2013 Dose caused significant painless diarrhea with food intake and subsequent unintentional weight loss  DC metformin 02/2014 with improved diarrhea, wt loss reversed - but cbgs uncontrolled On actos, titrated to max, but ineffective on cbg control DC Actos and start januvia 09/2014 - cbs still 170-220s fasting Add back low dose Amaryl in 10/2014 as previously with good control on same - Reports improved cbgs - recheck a1c today Due for optho f/u next week (02/06/15 - [facility])  Lab Results  Component Value Date   HGBA1C 10.7* 10/10/2014        Relevant Medications   sitaGLIPtin (JANUVIA) 100 MG tablet   simvastatin (ZOCOR) 20 MG tablet   losartan (COZAAR) 25 MG tablet  glimepiride (AMARYL) 2 MG tablet   aspirin EC 81 MG tablet   Other Relevant Orders   Hemoglobin A1c   Microalbumin / creatinine urine ratio   Essential hypertension    BP Readings from Last 3 Encounters:  02/01/15 132/72  01/20/15 118/87  10/10/14 140/80   Generally well controlled, reports influenced readings by "stress" Started low dose ARB 10/2014 - improved The current medical regimen is effective;  continue present plan and medications.      Relevant Medications   simvastatin (ZOCOR) 20 MG tablet   losartan (COZAAR) 25 MG tablet   aspirin EC 81 MG tablet   Hyperlipidemia    On statin Check lipids annually and titrate as needed      Relevant  Medications   simvastatin (ZOCOR) 20 MG tablet   losartan (COZAAR) 25 MG tablet   aspirin EC 81 MG tablet    Other Visit Diagnoses    Annual physical exam    -  Primary    Relevant Orders    Basic Metabolic Panel (BMET)    CBC w/Diff    Hepatic function panel    Lipid Profile    Urinalysis, Routine w reflex microscopic    TSH        Gwendolyn Grant, MD

## 2015-02-01 NOTE — Assessment & Plan Note (Signed)
BP Readings from Last 3 Encounters:  02/01/15 132/72  01/20/15 118/87  10/10/14 140/80   Generally well controlled, reports influenced readings by "stress" Started low dose ARB 10/2014 - improved The current medical regimen is effective;  continue present plan and medications.

## 2015-02-06 LAB — HM DIABETES EYE EXAM

## 2015-02-13 ENCOUNTER — Encounter: Payer: Self-pay | Admitting: Internal Medicine

## 2015-04-04 ENCOUNTER — Other Ambulatory Visit: Payer: Self-pay | Admitting: Internal Medicine

## 2015-05-16 ENCOUNTER — Telehealth: Payer: Self-pay | Admitting: *Deleted

## 2015-05-16 MED ORDER — FREESTYLE UNISTICK II LANCETS MISC: Status: DC

## 2015-05-16 MED ORDER — GLUCOSE BLOOD VI STRP: ORAL_STRIP | Status: DC

## 2015-05-16 NOTE — Telephone Encounter (Signed)
Receive call pt is wanting to pick up rx on his testing supplies. Verified BS monitor inform pt will leave strips & lancets rx on the freestyle up front for pick-up...Johny Chess

## 2015-08-07 ENCOUNTER — Encounter: Payer: Self-pay | Admitting: Family

## 2015-08-07 ENCOUNTER — Ambulatory Visit (INDEPENDENT_AMBULATORY_CARE_PROVIDER_SITE_OTHER): Payer: Medicare Other | Admitting: Family

## 2015-08-07 ENCOUNTER — Ambulatory Visit: Payer: Medicare Other | Admitting: Internal Medicine

## 2015-08-07 ENCOUNTER — Other Ambulatory Visit (INDEPENDENT_AMBULATORY_CARE_PROVIDER_SITE_OTHER): Payer: Medicare Other

## 2015-08-07 VITALS — BP 142/88 | HR 66 | Temp 97.6°F | Resp 16 | Ht 66.0 in | Wt 168.8 lb

## 2015-08-07 DIAGNOSIS — K649 Unspecified hemorrhoids: Secondary | ICD-10-CM

## 2015-08-07 DIAGNOSIS — E118 Type 2 diabetes mellitus with unspecified complications: Secondary | ICD-10-CM

## 2015-08-07 LAB — HEMOGLOBIN A1C: Hgb A1c MFr Bld: 8.7 % — ABNORMAL HIGH (ref 4.6–6.5)

## 2015-08-07 NOTE — Progress Notes (Signed)
Pre visit review using our clinic review tool, if applicable. No additional management support is needed unless otherwise documented below in the visit note. 

## 2015-08-07 NOTE — Patient Instructions (Addendum)
Thank you for choosing [facility].  Summary/Instructions:  Your prescription(s) have been submitted to your pharmacy or been printed and provided for you. Please take as directed and contact our office if you believe you are having problem(s) with the medication(s) or have any questions.  If your symptoms worsen or fail to improve, please contact our office for further instruction, or in case of emergency go directly to the emergency room at the closest medical facility.   Hemorrhoids Hemorrhoids are swollen veins around the rectum or anus. There are two types of hemorrhoids:   Internal hemorrhoids. These occur in the veins just inside the rectum. They may poke through to the outside and become irritated and painful.  External hemorrhoids. These occur in the veins outside the anus and can be felt as a painful swelling or hard lump near the anus. CAUSES  Pregnancy.   Obesity.   Constipation or diarrhea.   Straining to have a bowel movement.   Sitting for long periods on the toilet.  Heavy lifting or other activity that caused you to strain.  Anal intercourse. SYMPTOMS   Pain.   Anal itching or irritation.   Rectal bleeding.   Fecal leakage.   Anal swelling.   One or more lumps around the anus.  DIAGNOSIS  Your caregiver may be able to diagnose hemorrhoids by visual examination. Other examinations or tests that may be performed include:   Examination of the rectal area with a gloved hand (digital rectal exam).   Examination of anal canal using a small tube (scope).   A blood test if you have lost a significant amount of blood.  A test to look inside the colon (sigmoidoscopy or colonoscopy). TREATMENT Most hemorrhoids can be treated at home. However, if symptoms do not seem to be getting better or if you have a lot of rectal bleeding, your caregiver may perform a procedure to help make the hemorrhoids get smaller or remove them completely.  Possible treatments include:   Placing a rubber band at the base of the hemorrhoid to cut off the circulation (rubber band ligation).   Injecting a chemical to shrink the hemorrhoid (sclerotherapy).   Using a tool to burn the hemorrhoid (infrared light therapy).   Surgically removing the hemorrhoid (hemorrhoidectomy).   Stapling the hemorrhoid to block blood flow to the tissue (hemorrhoid stapling).  HOME CARE INSTRUCTIONS   Eat foods with fiber, such as whole grains, beans, nuts, fruits, and vegetables. Ask your doctor about taking products with added fiber in them (fibersupplements).  Increase fluid intake. Drink enough water and fluids to keep your urine clear or pale yellow.   Exercise regularly.   Go to the bathroom when you have the urge to have a bowel movement. Do not wait.   Avoid straining to have bowel movements.   Keep the anal area dry and clean. Use wet toilet paper or moist towelettes after a bowel movement.   Medicated creams and suppositories may be used or applied as directed.   Only take over-the-counter or prescription medicines as directed by your caregiver.   Take warm sitz baths for 15-20 minutes, 3-4 times a day to ease pain and discomfort.   Place ice packs on the hemorrhoids if they are tender and swollen. Using ice packs between sitz baths may be helpful.   Put ice in a plastic bag.   Place a towel between your skin and the bag.   Leave the ice on for 15-20 minutes, 3-4 times a  day.   Do not use a donut-shaped pillow or sit on the toilet for long periods. This increases blood pooling and pain.  SEEK MEDICAL CARE IF:  You have increasing pain and swelling that is not controlled by treatment or medicine.  You have uncontrolled bleeding.  You have difficulty or you are unable to have a bowel movement.  You have pain or inflammation outside the area of the hemorrhoids. MAKE SURE YOU:  Understand these instructions.  Will  watch your condition.  Will get help right away if you are not doing well or get worse.   This information is not intended to replace advice given to you by your health care provider. Make sure you discuss any questions you have with your health care provider.   Document Released: 06/21/2000 Document Revised: 06/10/2012 Document Reviewed: 04/28/2012 Elsevier Interactive Patient Education [employer].

## 2015-08-07 NOTE — Progress Notes (Signed)
Subjective:    Patient ID: Alex Massey, male    DOB: 1940/11/28, 75 y.o.   MRN: [id number]  Chief Complaint  Patient presents with  . Establish Care    follow up on diabetes and has issues with blood in stool does not know if it is hemerroids    HPI:  Alex Massey is a 75 y.o. male who  has a past medical history of DIABETES MELLITUS, TYPE II; ERECTILE DYSFUNCTION; FOOT DROP, RIGHT; HYPERLIPIDEMIA; and NEPHROLITHIASIS, HX OF. and presents today for a follow-up office visit.  1.) Type 2 diabetes - currently maintained on Januvia and glimepiride. Takes the medication as prescribed and denies adverse side effects or hypoglycemic events.. Home glucose readings are reported as around 150. Does have slight numbness in tingling which has been going on since his back surgery. Denies other symptoms of end organ damage. Will undergo cataract operation.  Lab Results  Component Value Date   HGBA1C 8.7* 08/07/2015     2.) Blood in stool - This is a new problem. Associated symptom of blood in his stool has been going on for about month. No burning or itching. Describes the blood as bright red and mainly on the toilet paper. Modifying factors include Preparation H. Denies any pain with bowel movements. Frequency of bowel movements is about 1-2 times per day and is not currently taking any laxatives or stool softener. Blood is noted about 2-3 times per week. Denies nausea, vomiting, diarrhea or abdominal pain. No changes in his dietary intake.   Allergies  Allergen Reactions  . Metformin And Related Other (See Comments)    GI upset, diarrhea, wt loss     Current Outpatient Prescriptions on File Prior to Visit  Medication Sig Dispense Refill  . ALPRAZolam (XANAX) 0.5 MG tablet Take 1 tablet (0.5 mg total) by mouth daily as needed for anxiety. 30 tablet 0  . aspirin EC 81 MG tablet Take 1 tablet (81 mg total) by mouth daily. 150 tablet 2  . FREESTYLE UNISTICK II LANCETS MISC Pt to test  blood sugar twice daily. Dx: E11.09 200 each 0  . glimepiride (AMARYL) 2 MG tablet Take 1 tablet (2 mg total) by mouth daily before breakfast. 90 tablet 3  . glucose blood (FREESTYLE TEST STRIPS) test strip Use to check blood sugars twice a day dx E11.09 200 each 0  . losartan (COZAAR) 25 MG tablet Take 1 tablet (25 mg total) by mouth daily. 90 tablet 3  . sildenafil (VIAGRA) 100 MG tablet Take 0.5-1 tablets (50-100 mg total) by mouth daily as needed for erectile dysfunction. 2 tablet 0  . simvastatin (ZOCOR) 20 MG tablet Take 1 tablet (20 mg total) by mouth at bedtime. 90 tablet 3  . sitaGLIPtin (JANUVIA) 100 MG tablet Take 1 tablet (100 mg total) by mouth daily. 90 tablet 3  . vitamin B-12 (CYANOCOBALAMIN) 1000 MCG tablet Take 1,000 mcg by mouth daily.     No current facility-administered medications on file prior to visit.     Past Surgical History  Procedure Laterality Date  . L-spine surgery  [id number]  . Lumbar epidural injection  2009, 2006    dr newton    Past Medical History  Diagnosis Date  . DIABETES MELLITUS, TYPE II   . ERECTILE DYSFUNCTION   . FOOT DROP, RIGHT     s/p ESI x 3 at [place] in 2009 and 2016  . HYPERLIPIDEMIA   . NEPHROLITHIASIS, HX OF  Review of Systems  Constitutional: Negative for fever and chills.  Eyes:       Negative for changes in vision.   Respiratory: Negative for chest tightness and shortness of breath.   Cardiovascular: Negative for chest pain, palpitations and leg swelling.  Neurological: Negative for dizziness, weakness and headaches.      Objective:    BP 142/88 mmHg  Pulse 66  Temp(Src) 97.6 F (36.4 C) (Oral)  Resp 16  Ht 5\' 6"  (1.676 m)  Wt 168 lb 12.8 oz (76.567 kg)  BMI 27.26 kg/m2  SpO2 98% Nursing note and vital signs reviewed.  Physical Exam  Constitutional: He is oriented to person, place, and time. He appears well-developed and well-nourished. No distress.  Cardiovascular: Normal rate, regular rhythm,  normal heart sounds and intact distal pulses.   Pulmonary/Chest: Effort normal and breath sounds normal.  Neurological: He is alert and oriented to person, place, and time.  Diabetic foot exam - bilateral feet are free from skin breakdown, cuts, and abrasions. Toenails are neatly trimmed. Pulses are intact and appropriate. Sensation is intact to monofilament bilaterally with a slight generalized reduction in sensation of the right.   Skin: Skin is warm and dry.  Psychiatric: He has a normal mood and affect. His behavior is normal. Judgment and thought content normal.       Assessment & Plan:   Problem List Items Addressed This Visit      Cardiovascular and Mediastinum   Hemorrhoid    Symptoms consistent with hemorrhoid. Declines rectal exam today. Continue with conservative treatment with over the counter medications as needed. Discussed avoiding constipation and good rectal hygiene. Follow up if symptoms worsen or do not improve.         Endocrine   Diabetes ([place]) - Primary    Type 2 diabetes appears well controlled with current regimen and A1c of 7.0. Takes medications as prescribed and denies adverse side effects. Maintained on losartan and simvastatin for CAD risk reduction. Diabetic foot exam completed today. Diabetic eye exam and Pneumovax is up to date. Influenza is up to date. Continue current dosage of glimepiride and sitaglipitin pending A1c results. Follow up in 6 months or sooner if needed.       Relevant Orders   Hemoglobin A1c (Completed)

## 2015-08-07 NOTE — Assessment & Plan Note (Signed)
Type 2 diabetes appears well controlled with current regimen and A1c of 7.0. Takes medications as prescribed and denies adverse side effects. Maintained on losartan and simvastatin for CAD risk reduction. Diabetic foot exam completed today. Diabetic eye exam and Pneumovax is up to date. Influenza is up to date. Continue current dosage of glimepiride and sitaglipitin pending A1c results. Follow up in 6 months or sooner if needed.

## 2015-08-07 NOTE — Assessment & Plan Note (Signed)
Symptoms consistent with hemorrhoid. Declines rectal exam today. Continue with conservative treatment with over the counter medications as needed. Discussed avoiding constipation and good rectal hygiene. Follow up if symptoms worsen or do not improve.

## 2015-08-09 ENCOUNTER — Telehealth: Payer: Self-pay | Admitting: Family

## 2015-08-09 NOTE — Telephone Encounter (Signed)
Please inform patient that his A1c has increased from 7.0 to 8.7 indicating worsening control of his diabetes. Please confirm he is taking the Amaryl (glimepiride). If he is, I would like him to increase his Amaryl to 3 mg daily. If he is not we will send a new prescription.  We will follow up in 3 months to recheck his A1c.

## 2015-08-11 MED ORDER — GLIPIZIDE 5 MG PO TABS: 2.5000 mg | ORAL_TABLET | Freq: Every day | ORAL | Status: DC

## 2015-08-11 NOTE — Telephone Encounter (Signed)
Pt is aware.  

## 2015-08-11 NOTE — Telephone Encounter (Signed)
Pt is not taking amaryl he is taking [place] and states that he has been taking it every day. He would like to know what he needs to do today with his medication to help his blood sugar. Please advise.

## 2015-08-11 NOTE — Telephone Encounter (Signed)
Please inform patient that I have sent a prescription for glipizide for him to start. This is a different medication than he was on for very long. He can plan to follow up in 3 months.

## 2015-09-26 DIAGNOSIS — H02051 Trichiasis without entropian right upper eyelid: Secondary | ICD-10-CM | POA: Diagnosis not present

## 2015-10-02 ENCOUNTER — Other Ambulatory Visit: Payer: Self-pay | Admitting: Internal Medicine

## 2015-10-07 ENCOUNTER — Other Ambulatory Visit: Payer: Self-pay | Admitting: Family

## 2015-11-06 ENCOUNTER — Telehealth: Payer: Self-pay | Admitting: Family

## 2015-11-06 ENCOUNTER — Ambulatory Visit (INDEPENDENT_AMBULATORY_CARE_PROVIDER_SITE_OTHER): Payer: Medicare Other | Admitting: Family

## 2015-11-06 ENCOUNTER — Encounter: Payer: Self-pay | Admitting: Family

## 2015-11-06 ENCOUNTER — Other Ambulatory Visit (INDEPENDENT_AMBULATORY_CARE_PROVIDER_SITE_OTHER): Payer: Medicare Other

## 2015-11-06 VITALS — BP 160/90 | HR 64 | Temp 98.0°F | Resp 16 | Ht 66.0 in | Wt 169.0 lb

## 2015-11-06 DIAGNOSIS — I10 Essential (primary) hypertension: Secondary | ICD-10-CM

## 2015-11-06 DIAGNOSIS — E118 Type 2 diabetes mellitus with unspecified complications: Secondary | ICD-10-CM

## 2015-11-06 LAB — HEMOGLOBIN A1C: Hgb A1c MFr Bld: 8.6 % — ABNORMAL HIGH (ref 4.6–6.5)

## 2015-11-06 MED ORDER — LOSARTAN POTASSIUM 50 MG PO TABS: 50.0000 mg | ORAL_TABLET | Freq: Every day | ORAL | Status: DC

## 2015-11-06 MED ORDER — DAPAGLIFLOZIN PROPANEDIOL 5 MG PO TABS: 5.0000 mg | ORAL_TABLET | Freq: Every day | ORAL | Status: DC

## 2015-11-06 NOTE — Progress Notes (Signed)
Subjective:    Patient ID: Alex Massey, male    DOB: [birth year], 75 y.o.   MRN: [id number]  Chief Complaint  Patient presents with  . Follow-up    diabetes, states that he can not get his sugars down     HPI:  Alex Massey is a 75 y.o. male who  has a past medical history of DIABETES MELLITUS, TYPE II; ERECTILE DYSFUNCTION; FOOT DROP, RIGHT; HYPERLIPIDEMIA; and NEPHROLITHIASIS, HX OF. and presents today For a follow-up office visit.  1.) Type 2 diabetes - currently maintained on Januvia and glimepiride. Reports taking the medication as prescribed and denies adverse side effects or hypoglycemic readings. Endorses that his home blood sugars have been elevated with an average between 140-160. Trying to eat an improved diet and does exercise fairly regularly.  Does have some numbness in his right lower extremity most likely related to previous surgery.    Lab Results  Component Value Date   HGBA1C 8.7* 08/07/2015    2.) Essential hypertension - Currently maintained on losartan. Reports taking the medication as prescribed and denies adverse side effects or hypotensive readings. Denies symptoms of end organ damage.   BP Readings from Last 3 Encounters:  11/06/15 160/90  08/07/15 142/88  02/01/15 132/72    Allergies  Allergen Reactions  . Metformin And Related Other (See Comments)    GI upset, diarrhea, wt loss     Current Outpatient Prescriptions on File Prior to Visit  Medication Sig Dispense Refill  . ALPRAZolam (XANAX) 0.5 MG tablet Take 1 tablet (0.5 mg total) by mouth daily as needed for anxiety. 30 tablet 0  . aspirin EC 81 MG tablet Take 1 tablet (81 mg total) by mouth daily. 150 tablet 2  . FREESTYLE UNISTICK II LANCETS MISC Pt to test blood sugar twice daily. Dx: E11.09 200 each 0  . glipiZIDE (GLUCOTROL) 5 MG tablet TAKE 1/2 TABLET(2.5 MG) BY MOUTH DAILY BEFORE BREAKFAST 30 tablet 0  . glucose blood (FREESTYLE TEST STRIPS) test strip Use to check blood  sugars twice a day dx E11.09 200 each 0  . sildenafil (VIAGRA) 100 MG tablet Take 0.5-1 tablets (50-100 mg total) by mouth daily as needed for erectile dysfunction. 2 tablet 0  . simvastatin (ZOCOR) 20 MG tablet Take 1 tablet (20 mg total) by mouth at bedtime. 90 tablet 3  . sitaGLIPtin (JANUVIA) 100 MG tablet Take 1 tablet (100 mg total) by mouth daily. 90 tablet 3  . vitamin B-12 (CYANOCOBALAMIN) 1000 MCG tablet Take 1,000 mcg by mouth daily.     No current facility-administered medications on file prior to visit.   Past Medical History  Diagnosis Date  . DIABETES MELLITUS, TYPE II   . ERECTILE DYSFUNCTION   . FOOT DROP, RIGHT     s/p ESI x 3 at [place] in 2009 and 2016  . HYPERLIPIDEMIA   . NEPHROLITHIASIS, HX OF   ;   Review of Systems  Constitutional: Negative for fever and chills.  Eyes:       Negative for changes in vision  Respiratory: Negative for cough, chest tightness and wheezing.   Cardiovascular: Negative for chest pain, palpitations and leg swelling.  Endocrine: Negative for polydipsia, polyphagia and polyuria.  Neurological: Negative for dizziness and weakness.      Objective:    BP 160/90 mmHg  Pulse 64  Temp(Src) 98 F (36.7 C) (Oral)  Resp 16  Ht 5\' 6"  (1.676 m)  Wt 169 lb (76.658 kg)  BMI 27.29 kg/m2  SpO2 98% Nursing note and vital signs reviewed.  Physical Exam  Constitutional: He is oriented to person, place, and time. He appears well-developed and well-nourished. No distress.  Cardiovascular: Normal rate, regular rhythm, normal heart sounds and intact distal pulses.   Pulmonary/Chest: Effort normal and breath sounds normal.  Neurological: He is alert and oriented to person, place, and time.  Skin: Skin is warm and dry.  Psychiatric: He has a normal mood and affect. His behavior is normal. Judgment and thought content normal.       Assessment & Plan:   Problem List Items Addressed This Visit      Cardiovascular and Mediastinum    Essential hypertension    Blood pressure above goal 140/90 with current regimen. Appears compliant with medication regimen. Increase losartan to 50 mg daily. Continue to monitor blood pressure at home. Decrease salt in diet and increase physical activity. No symptoms of end organ damage noted at this time.      Relevant Medications   losartan (COZAAR) 50 MG tablet   Other Relevant Orders   Hemoglobin A1c     Endocrine   Diabetes ([place]) - Primary    Type 2 diabetes with last A1c of 8.7 remains uncontrolled with home readings greater than 160 on average. Obtain A1c. Continue current dosage of glipizide and sitagliptin. Start Farxiga and check with insurance/pharmacy regarding coverage for SGLT2. All diabetic prevention is up-to-date. Maintained on simvastatin and losartan for CAD risk reduction. Follow-up in 3 months pending A1c results.  Farxiga sample Lot # [id number] Jun 2019      Relevant Medications   dapagliflozin propanediol (FARXIGA) 5 MG TABS tablet   losartan (COZAAR) 50 MG tablet   Other Relevant Orders   Hemoglobin A1c       I have discontinued Mr. [place]ton glimepiride and glimepiride. I have also changed his losartan. Additionally, I am having him start on dapagliflozin propanediol. Lastly, I am having him maintain his ALPRAZolam, vitamin B-12, simvastatin, aspirin EC, sildenafil, sitaGLIPtin, FREESTYLE UNISTICK II LANCETS, glucose blood, and glipiZIDE.    Follow-up: Return in about 3 months (around 02/06/2016).  [name], FNP

## 2015-11-06 NOTE — Progress Notes (Signed)
Pre visit review using our clinic review tool, if applicable. No additional management support is needed unless otherwise documented below in the visit note. 

## 2015-11-06 NOTE — Telephone Encounter (Signed)
Please inform patient this his blood work shows that his A1c is 8.6. Therefore please continue the medication as prescribed and let us know what is covered that we can add to help reduce his blood sugar.

## 2015-11-06 NOTE — Assessment & Plan Note (Signed)
Blood pressure above goal 140/90 with current regimen. Appears compliant with medication regimen. Increase losartan to 50 mg daily. Continue to monitor blood pressure at home. Decrease salt in diet and increase physical activity. No symptoms of end organ damage noted at this time.

## 2015-11-06 NOTE — Patient Instructions (Addendum)
Thank you for choosing [facility].  Summary/Instructions:  Please continue to take the medications as prescribed.   Please check your drug formulary for coverage of diabetes medications.  Continue to monitor your blood sugars at home.  Your prescription(s) have been submitted to your pharmacy or been printed and provided for you. Please take as directed and contact our office if you believe you are having problem(s) with the medication(s) or have any questions.  ithin 72 hours after test completion. If any changes need to be made, yo Please stop by the lab on the basement level of the building for your blood work. Your results will be released to [facility] (or called to you) after review, usually wu will be notified at that same time.   Diabetes and Exercise Exercising regularly is important. It is not just about losing weight. It has many health benefits, such as:  Improving your overall fitness, flexibility, and endurance.  Increasing your bone density.  Helping with weight control.  Decreasing your body fat.  Increasing your muscle strength.  Reducing stress and tension.  Improving your overall health. People with diabetes who exercise gain additional benefits because exercise:  Reduces appetite.  Improves the body's use of blood sugar (glucose).  Helps lower or control blood glucose.  Decreases blood pressure.  Helps control blood lipids (such as cholesterol and triglycerides).  Improves the body's use of the hormone insulin by:  Increasing the body's insulin sensitivity.  Reducing the body's insulin needs.  Decreases the risk for heart disease because exercising:  Lowers cholesterol and triglycerides levels.  Increases the levels of good cholesterol (such as high-density lipoproteins [HDL]) in the body.  Lowers blood glucose levels. YOUR ACTIVITY PLAN  Choose an activity that you enjoy, and set realistic goals. To exercise safely, you should begin  practicing any new physical activity slowly, and gradually increase the intensity of the exercise over time. Your health care provider or diabetes educator can help create an activity plan that works for you. General recommendations include:  Encouraging children to engage in at least 60 minutes of physical activity each day.  Stretching and performing strength training exercises, such as yoga or weight lifting, at least 2 times per week.  Performing a total of at least 150 minutes of moderate-intensity exercise each week, such as brisk walking or water aerobics.  Exercising at least 3 days per week, making sure you allow no more than 2 consecutive days to pass without exercising.  Avoiding long periods of inactivity (90 minutes or more). When you have to spend an extended period of time sitting down, take frequent breaks to walk or stretch. RECOMMENDATIONS FOR EXERCISING WITH TYPE 1 OR TYPE 2 DIABETES   Check your blood glucose before exercising. If blood glucose levels are greater than 240 mg/dL, check for urine ketones. Do not exercise if ketones are present.  Avoid injecting insulin into areas of the body that are going to be exercised. For example, avoid injecting insulin into:  The arms when playing tennis.  The legs when jogging.  Keep a record of:  Food intake before and after you exercise.  Expected peak times of insulin action.  Blood glucose levels before and after you exercise.  The type and amount of exercise you have done.  Review your records with your health care provider. Your health care provider will help you to develop guidelines for adjusting food intake and insulin amounts before and after exercising.  If you take insulin or oral hypoglycemic  agents, watch for signs and symptoms of hypoglycemia. They include:  Dizziness.  Shaking.  Sweating.  Chills.  Confusion.  Drink plenty of water while you exercise to prevent dehydration or heat stroke. Body  water is lost during exercise and must be replaced.  Talk to your health care provider before starting an exercise program to make sure it is safe for you. Remember, almost any type of activity is better than none.   This information is not intended to replace advice given to you by your health care provider. Make sure you discuss any questions you have with your health care provider.   Document Released: 09/14/2003 Document Revised: 11/08/2014 Document Reviewed: 12/01/2012 Elsevier Interactive Patient Education [employer].

## 2015-11-06 NOTE — Assessment & Plan Note (Addendum)
Type 2 diabetes with last A1c of 8.7 remains uncontrolled with home readings greater than 160 on average. Obtain A1c. Continue current dosage of glipizide and sitagliptin. Start Farxiga and check with insurance/pharmacy regarding coverage for SGLT2. All diabetic prevention is up-to-date. Maintained on simvastatin and losartan for CAD risk reduction. Follow-up in 3 months pending A1c results.  Farxiga sample Lot # [id number] Exp Jun 2019

## 2015-11-08 NOTE — Telephone Encounter (Signed)
Pt states that all medications suggested were $40 dollars and he can not afford it. He said that the pharmacist told him there were 2 types of metformin and he is interested in trying the other kind if its more affordable. Please advise.

## 2015-11-08 NOTE — Telephone Encounter (Signed)
We can try the extended release metformin, however I cannot guarantee that he will not have similar symptoms.

## 2015-11-09 MED ORDER — METFORMIN HCL ER 500 MG PO TB24: 500.0000 mg | ORAL_TABLET | Freq: Every day | ORAL | Status: DC

## 2015-11-09 NOTE — Telephone Encounter (Signed)
Medication sent.

## 2015-11-09 NOTE — Telephone Encounter (Signed)
Please send to pharmacy the [facility] that is listed.

## 2015-11-10 NOTE — Telephone Encounter (Signed)
Pt called stated he can not take the metformin because it gives him diarrhea. Please call pt back

## 2015-11-10 NOTE — Telephone Encounter (Signed)
Is there any other medication that he can try that is more affordable than the samples given to him? Please advise

## 2015-11-13 ENCOUNTER — Telehealth: Payer: Self-pay | Admitting: Family

## 2015-11-13 DIAGNOSIS — E118 Type 2 diabetes mellitus with unspecified complications: Secondary | ICD-10-CM

## 2015-11-13 MED ORDER — ACARBOSE 25 MG PO TABS: 25.0000 mg | ORAL_TABLET | Freq: Three times a day (TID) | ORAL | Status: DC

## 2015-11-13 NOTE — Telephone Encounter (Signed)
Medication sent to pharmacy  

## 2015-11-13 NOTE — Telephone Encounter (Signed)
Pt is interested in trying the acarbose. Please send to the [facility].

## 2015-11-13 NOTE — Telephone Encounter (Signed)
Pt request to speak to the assistant concern about Wilder Glade, pt stated he got a sample from last ov, he is running out of this med and want to see what [name] wants to do.

## 2016-01-31 ENCOUNTER — Other Ambulatory Visit: Payer: Self-pay | Admitting: Internal Medicine

## 2016-02-05 ENCOUNTER — Other Ambulatory Visit (INDEPENDENT_AMBULATORY_CARE_PROVIDER_SITE_OTHER): Payer: Medicare Other

## 2016-02-05 ENCOUNTER — Ambulatory Visit (INDEPENDENT_AMBULATORY_CARE_PROVIDER_SITE_OTHER): Payer: Medicare Other | Admitting: Family

## 2016-02-05 ENCOUNTER — Other Ambulatory Visit: Payer: Self-pay | Admitting: Family

## 2016-02-05 ENCOUNTER — Encounter: Payer: Self-pay | Admitting: Family

## 2016-02-05 VITALS — BP 140/92 | HR 65 | Temp 97.9°F | Resp 16 | Ht 66.0 in | Wt 164.8 lb

## 2016-02-05 DIAGNOSIS — H524 Presbyopia: Secondary | ICD-10-CM | POA: Diagnosis not present

## 2016-02-05 DIAGNOSIS — Z0000 Encounter for general adult medical examination without abnormal findings: Secondary | ICD-10-CM | POA: Diagnosis not present

## 2016-02-05 DIAGNOSIS — E785 Hyperlipidemia, unspecified: Secondary | ICD-10-CM | POA: Diagnosis not present

## 2016-02-05 DIAGNOSIS — R972 Elevated prostate specific antigen [PSA]: Secondary | ICD-10-CM

## 2016-02-05 DIAGNOSIS — E118 Type 2 diabetes mellitus with unspecified complications: Secondary | ICD-10-CM | POA: Diagnosis not present

## 2016-02-05 DIAGNOSIS — I10 Essential (primary) hypertension: Secondary | ICD-10-CM | POA: Diagnosis not present

## 2016-02-05 LAB — COMPREHENSIVE METABOLIC PANEL
ALBUMIN: 4.5 g/dL (ref 3.5–5.2)
ALT: 25 U/L (ref 0–53)
AST: 22 U/L (ref 0–37)
Alkaline Phosphatase: 83 U/L (ref 39–117)
BILIRUBIN TOTAL: 0.6 mg/dL (ref 0.2–1.2)
BUN: 15 mg/dL (ref 6–23)
CALCIUM: 10 mg/dL (ref 8.4–10.5)
CO2: 28 mEq/L (ref 19–32)
CREATININE: 0.9 mg/dL (ref 0.40–1.50)
Chloride: 105 mEq/L (ref 96–112)
GFR: 106.07 mL/min (ref >60.00)
Glucose, Bld: 139 mg/dL — ABNORMAL HIGH (ref 70–99)
Potassium: 4.3 mEq/L (ref 3.5–5.1)
Sodium: 143 mEq/L (ref 135–145)
TOTAL PROTEIN: 7.2 g/dL (ref 6.0–8.3)

## 2016-02-05 LAB — HEMOGLOBIN A1C: HEMOGLOBIN A1C: 7.5 % — AB (ref 4.6–6.5)

## 2016-02-05 LAB — PSA: PSA: 4.41 ng/mL — AB (ref 0.10–4.00)

## 2016-02-05 LAB — HM DIABETES EYE EXAM

## 2016-02-05 LAB — CBC
HCT: 39.8 % (ref 39.0–52.0)
Hemoglobin: 13 g/dL (ref 13.0–17.0)
MCHC: 32.7 g/dL (ref 30.0–36.0)
MCV: 77.4 fl — ABNORMAL LOW (ref 78.0–100.0)
PLATELETS: 154 10*3/uL (ref 150.0–400.0)
RBC: 5.14 Mil/uL (ref 4.22–5.81)
RDW: 15.1 % (ref 11.5–15.5)
WBC: 4.6 10*3/uL (ref 4.0–10.5)

## 2016-02-05 LAB — LIPID PANEL
CHOL/HDL RATIO: 2
CHOLESTEROL: 161 mg/dL (ref 0–200)
HDL: 68.1 mg/dL (ref >39.00)
LDL Cholesterol: 75 mg/dL (ref 0–99)
NonHDL: 92.95
TRIGLYCERIDES: 88 mg/dL (ref 0.0–149.0)
VLDL: 17.6 mg/dL (ref 0.0–40.0)

## 2016-02-05 MED ORDER — METFORMIN HCL ER 750 MG PO TB24: 750.0000 mg | ORAL_TABLET | Freq: Every day | ORAL | 0 refills | Status: DC

## 2016-02-05 NOTE — Assessment & Plan Note (Signed)
Type 2 diabetes with most recent A1c remain elevated with current regimen. Obtain A1c. Continue current dosage of metformin and a carbose pending A1c results.

## 2016-02-05 NOTE — Patient Instructions (Signed)
Thank you for choosing [facility].  Summary/Instructions:  Continue to take your medications as prescribed.   Please stop by the lab on the lower level of the building for your blood work. Your results will be released to [facility] (or called to you) after review, usually within 72 hours after test completion. If any changes need to be made, you will be notified at that same time.  1. The lab is open from 7:30am to 5:30 pm Monday-Friday  2. No appointment is necessary  3. Fasting (if needed) is 6-8 hours after food and drink; black coffee and water  are okay   Health Maintenance  Topic Date Due  . INFLUENZA VACCINE  02/06/2016  . HEMOGLOBIN A1C  05/08/2016  . FOOT EXAM  08/06/2016  . OPHTHALMOLOGY EXAM  02/04/2017  . TETANUS/TDAP  01/03/2020  . COLONOSCOPY  01/20/2020  . ZOSTAVAX  Completed  . PNA vac Low Risk Adult  Completed   Health Maintenance, Male A healthy lifestyle and preventative care can promote health and wellness.  Maintain regular health, dental, and eye exams.  Eat a healthy diet. Foods like vegetables, fruits, whole grains, low-fat dairy products, and lean protein foods contain the nutrients you need and are low in calories. Decrease your intake of foods high in solid fats, added sugars, and salt. Get information about a proper diet from your health care provider, if necessary.  Regular physical exercise is one of the most important things you can do for your health. Most adults should get at least 150 minutes of moderate-intensity exercise (any activity that increases your heart rate and causes you to sweat) each week. In addition, most adults need muscle-strengthening exercises on 2 or more days a week.   Maintain a healthy weight. The body mass index (BMI) is a screening tool to identify possible weight problems. It provides an estimate of body fat based on height and weight. Your health care provider can find your BMI and can help you achieve or maintain a  healthy weight. For males 20 years and older:  A BMI below 18.5 is considered underweight.  A BMI of 18.5 to 24.9 is normal.  A BMI of 25 to 29.9 is considered overweight.  A BMI of 30 and above is considered obese.  Maintain normal blood lipids and cholesterol by exercising and minimizing your intake of saturated fat. Eat a balanced diet with plenty of fruits and vegetables. Blood tests for lipids and cholesterol should begin at age 64 and be repeated every 5 years. If your lipid or cholesterol levels are high, you are over age 51, or you are at high risk for heart disease, you may need your cholesterol levels checked more frequently.Ongoing high lipid and cholesterol levels should be treated with medicines if diet and exercise are not working.  If you smoke, find out from your health care provider how to quit. If you do not use tobacco, do not start.  Lung cancer screening is recommended for adults aged 24-80 years who are at high risk for developing lung cancer because of a history of smoking. A yearly low-dose CT scan of the lungs is recommended for people who have at least a 30-pack-year history of smoking and are current smokers or have quit within the past 15 years. A pack year of smoking is smoking an average of 1 pack of cigarettes a day for 1 year (for example, a 30-pack-year history of smoking could mean smoking 1 pack a day for 30 years or  2 packs a day for 15 years). Yearly screening should continue until the smoker has stopped smoking for at least 15 years. Yearly screening should be stopped for people who develop a health problem that would prevent them from having lung cancer treatment.  If you choose to drink alcohol, do not have more than 2 drinks per day. One drink is considered to be 12 oz (360 mL) of beer, 5 oz (150 mL) of wine, or 1.5 oz (45 mL) of liquor.  Avoid the use of street drugs. Do not share needles with anyone. Ask for help if you need support or instructions about  stopping the use of drugs.  High blood pressure causes heart disease and increases the risk of stroke. High blood pressure is more likely to develop in:  People who have blood pressure in the end of the normal range (100-139/85-89 mm Hg).  People who are overweight or obese.  People who are African American.  If you are 45-47 years of age, have your blood pressure checked every 3-5 years. If you are 65 years of age or older, have your blood pressure checked every year. You should have your blood pressure measured twice--once when you are at a hospital or clinic, and once when you are not at a hospital or clinic. Record the average of the two measurements. To check your blood pressure when you are not at a hospital or clinic, you can use:  An automated blood pressure machine at a pharmacy.  A home blood pressure monitor.  If you are 36-27 years old, ask your health care provider if you should take aspirin to prevent heart disease.  Diabetes screening involves taking a blood sample to check your fasting blood sugar level. This should be done once every 3 years after age 1 if you are at a normal weight and without risk factors for diabetes. Testing should be considered at a younger age or be carried out more frequently if you are overweight and have at least 1 risk factor for diabetes.  Colorectal cancer can be detected and often prevented. Most routine colorectal cancer screening begins at the age of 24 and continues through age 38. However, your health care provider may recommend screening at an earlier age if you have risk factors for colon cancer. On a yearly basis, your health care provider may provide home test kits to check for hidden blood in the stool. A small camera at the end of a tube may be used to directly examine the colon (sigmoidoscopy or colonoscopy) to detect the earliest forms of colorectal cancer. Talk to your health care provider about this at age 7 when routine screening  begins. A direct exam of the colon should be repeated every 5-10 years through age 28, unless early forms of precancerous polyps or small growths are found.  People who are at an increased risk for hepatitis B should be screened for this virus. You are considered at high risk for hepatitis B if:  You were born in a country where hepatitis B occurs often. Talk with your health care provider about which countries are considered high risk.  Your parents were born in a high-risk country and you have not received a shot to protect against hepatitis B (hepatitis B vaccine).  You have HIV or AIDS.  You use needles to inject street drugs.  You live with, or have sex with, someone who has hepatitis B.  You are a man who has sex with other men (MSM).  You get hemodialysis treatment.  You take certain medicines for conditions like cancer, organ transplantation, and autoimmune conditions.  Hepatitis C blood testing is recommended for all people born from [birth year] through 1964 and any individual with known risk factors for hepatitis C.  Healthy men should no longer receive prostate-specific antigen (PSA) blood tests as part of routine cancer screening. Talk to your health care provider about prostate cancer screening.  Testicular cancer screening is not recommended for adolescents or adult males who have no symptoms. Screening includes self-exam, a health care provider exam, and other screening tests. Consult with your health care provider about any symptoms you have or any concerns you have about testicular cancer.  Practice safe sex. Use condoms and avoid high-risk sexual practices to reduce the spread of sexually transmitted infections (STIs).  You should be screened for STIs, including gonorrhea and chlamydia if:  You are sexually active and are younger than 24 years.  You are older than 24 years, and your health care provider tells you that you are at risk for this type of infection.  Your  sexual activity has changed since you were last screened, and you are at an increased risk for chlamydia or gonorrhea. Ask your health care provider if you are at risk.  If you are at risk of being infected with HIV, it is recommended that you take a prescription medicine daily to prevent HIV infection. This is called pre-exposure prophylaxis (PrEP). You are considered at risk if:  You are a man who has sex with other men (MSM).  You are a heterosexual man who is sexually active with multiple partners.  You take drugs by injection.  You are sexually active with a partner who has HIV.  Talk with your health care provider about whether you are at high risk of being infected with HIV. If you choose to begin PrEP, you should first be tested for HIV. You should then be tested every 3 months for as long as you are taking PrEP.  Use sunscreen. Apply sunscreen liberally and repeatedly throughout the day. You should seek shade when your shadow is shorter than you. Protect yourself by wearing long sleeves, pants, a wide-brimmed hat, and sunglasses year round whenever you are outdoors.  Tell your health care provider of new moles or changes in moles, especially if there is a change in shape or color. Also, tell your health care provider if a mole is larger than the size of a pencil eraser.  A one-time screening for abdominal aortic aneurysm (AAA) and surgical repair of large AAAs by ultrasound is recommended for men aged 61-75 years who are current or former smokers.  Stay current with your vaccines (immunizations).   This information is not intended to replace advice given to you by your health care provider. Make sure you discuss any questions you have with your health care provider.   Document Released: 12/21/2007 Document Revised: 07/15/2014 Document Reviewed: 11/19/2010 Elsevier Interactive Patient Education [employer].

## 2016-02-05 NOTE — Assessment & Plan Note (Signed)
1) Anticipatory Guidance: Discussed importance of wearing a seatbelt while driving and not texting while driving; changing batteries in smoke detector at least once annually; wearing suntan lotion when outside; eating a balanced and moderate diet; getting physical activity at least 30 minutes per day.  2) Immunizations / Screenings / Labs:  All immunizations are up-to-date per recommendations. Obtain PSA for prostate cancer screening. Obtain A1c for diabetes screen. All other screenings are up-to-date per recommendations. Obtain CBC, CMET, and Lipid profile.  Overall well exam with risk factors for cardiovascular disease including hypertension, hyperlipidemia, and type 2 diabetes. Blood pressure slightly elevated today although patient reports blood pressures at home are significantly lower. Current cholesterol status which check profile. Diabetes status will be checked with A1c. Continue current dosage of medication pending A1c results. Continue healthy lifestyle behaviors are chronic conditions pending blood work.

## 2016-02-05 NOTE — Assessment & Plan Note (Signed)
Hypertension appears adequately controlled with current regimen and add goal of 140/90. No adverse side effects or end organ damage noted. Denies worse headache. Continue current dosage of losartan.

## 2016-02-05 NOTE — Assessment & Plan Note (Signed)
Hyperlipidemia currently managed with simvastatin. Obtain lipid profile check current status. Continue current dosage of simvastatin pending lipid profile results.

## 2016-02-05 NOTE — Assessment & Plan Note (Signed)
Reviewed and updated patient's medical, surgical, family and social history. Medications and allergies were also reviewed. Basic screenings for depression, activities of daily living, hearing, cognition and safety were performed. Provider list was updated and health plan was provided to the patient.  

## 2016-02-05 NOTE — Progress Notes (Signed)
Subjective:    Patient ID: Alex Massey, male    DOB: 1941/03/11, 75 y.o.   MRN: [id number]  Chief Complaint  Patient presents with  . Cpe    fasting    HPI:  Alex Massey is a 75 y.o. male who presents today for an annual wellness visit.   1) Health Maintenance -   Diet - Averaging about 3 meals per day consisting of regular diet; Occasional processed/sugary foods; Caffeine intake about 1-2 cups daily  Exercise - Walks 2 miles daily  2) Preventative Exams / Immunizations:  Dental -- Up to date  Vision -- Up to date   Health Maintenance  Topic Date Due  . INFLUENZA VACCINE  02/06/2016  . HEMOGLOBIN A1C  05/08/2016  . FOOT EXAM  08/06/2016  . OPHTHALMOLOGY EXAM  02/04/2017  . TETANUS/TDAP  01/03/2020  . COLONOSCOPY  01/20/2020  . ZOSTAVAX  Completed  . PNA vac Low Risk Adult  Completed     Immunization History  Administered Date(s) Administered  . Influenza Split 04/09/2011, 04/13/2012  . Influenza Whole 04/04/2008, 04/19/2009, 04/12/2010  . Influenza, High Dose Seasonal PF 05/13/2013  . Influenza,inj,Quad PF,36+ Mos 04/06/2014  . Pneumococcal Conjugate-13 09/12/2014  . Pneumococcal Polysaccharide-23 01/02/2010  . Td 01/02/2010  . Zoster 01/07/2012    RISK FACTORS  Tobacco History  Smoking Status  . Former Smoker  . Types: Cigarettes  . Quit date: 07/09/2004  Smokeless Tobacco  . Never Used     Cardiac risk factors: advanced age (older than 41 for men, 103 for women), diabetes mellitus, dyslipidemia, hypertension and male gender.  Depression Screen  Q1: Over the past two weeks, have you felt down, depressed or hopeless? No  Q2: Over the past two weeks, have you felt little interest or pleasure in doing things? No  Have you lost interest or pleasure in daily life? No  Do you often feel hopeless? No  Do you cry easily over simple problems? No  Depression screen PHQ 2/9 02/05/2016  Decreased Interest 0  Down, Depressed, Hopeless 0  PHQ  - 2 Score 0   Activities of Daily Living In your present state of health, do you have any difficulty performing the following activities?:  Driving? No Managing money?  No Feeding yourself? No Getting from bed to chair? No Climbing a flight of stairs? No Preparing food and eating?: No Bathing or showering? No Getting dressed: No Getting to the toilet? No Using the toilet: No Moving around from place to place: No In the past year have you fallen or had a near fall?:No   Home Safety Has smoke detector and wears seat belts. No firearms. No excess sun exposure. Are there smokers in your home (other than you)?  No Do you feel safe at home?  Yes  Hearing Difficulties: No Do you often ask people to speak up or repeat themselves? No Do you experience ringing or noises in your ears? No  Do you have difficulty understanding soft or whispered voices? No    Cognitive Testing  Alert? Yes   Normal Appearance? Yes  Oriented to person? Yes  Place? Yes   Time? Yes  Recall of three objects?  Yes  Can perform simple calculations? Yes  Displays appropriate judgment? Yes  Can read the correct time from a watch face? Yes  Do you feel that you have a problem with memory? No  Do you often misplace items? No   Advanced Directives have been discussed with the  patient? Yes  Current Physicians/Providers and Suppliers  1. Terri Piedra, FNP - Internal Medicine  Indicate any recent Medical Services you may have received from other than [facility] providers in the past year (date may be approximate).  All answers were reviewed with the patient and necessary referrals were made:  [name], FNP   02/05/2016    Allergies  Allergen Reactions  . Metformin And Related Other (See Comments)    GI upset, diarrhea, wt loss     Outpatient Medications Prior to Visit  Medication Sig Dispense Refill  . acarbose (PRECOSE) 25 MG tablet Take 1 tablet (25 mg total) by mouth 3 (three) times daily with meals.  90 tablet 2  . ALPRAZolam (XANAX) 0.5 MG tablet Take 1 tablet (0.5 mg total) by mouth daily as needed for anxiety. 30 tablet 0  . aspirin EC 81 MG tablet Take 1 tablet (81 mg total) by mouth daily. 150 tablet 2  . FREESTYLE UNISTICK II LANCETS MISC Pt to test blood sugar twice daily. Dx: E11.09 200 each 0  . glipiZIDE (GLUCOTROL) 5 MG tablet TAKE 1/2 TABLET(2.5 MG) BY MOUTH DAILY BEFORE BREAKFAST 30 tablet 0  . glucose blood (FREESTYLE TEST STRIPS) test strip Use to check blood sugars twice a day dx E11.09 200 each 0  . losartan (COZAAR) 50 MG tablet Take 1 tablet (50 mg total) by mouth daily. 90 tablet 1  . metFORMIN (GLUCOPHAGE-XR) 500 MG 24 hr tablet Take 1 tablet (500 mg total) by mouth daily with breakfast. 30 tablet 0  . sildenafil (VIAGRA) 100 MG tablet Take 0.5-1 tablets (50-100 mg total) by mouth daily as needed for erectile dysfunction. 2 tablet 0  . simvastatin (ZOCOR) 20 MG tablet TAKE 1 TABLET BY MOUTH AT BEDTIME 90 tablet 2  . sitaGLIPtin (JANUVIA) 100 MG tablet Take 1 tablet (100 mg total) by mouth daily. 90 tablet 3  . vitamin B-12 (CYANOCOBALAMIN) 1000 MCG tablet Take 1,000 mcg by mouth daily.    . dapagliflozin propanediol (FARXIGA) 5 MG TABS tablet Take 5 mg by mouth daily. 14 tablet 0   No facility-administered medications prior to visit.      Past Medical History:  Diagnosis Date  . DIABETES MELLITUS, TYPE II   . ERECTILE DYSFUNCTION   . FOOT DROP, RIGHT    s/p ESI x 3 at [place] in 2009 and 2016  . HYPERLIPIDEMIA   . NEPHROLITHIASIS, HX OF      Past Surgical History:  Procedure Laterality Date  . L-spine surgery  [id number]  . LUMBAR EPIDURAL INJECTION  2009, 2006   dr newton     Family History  Problem Relation Age of Onset  . Healthy Mother   . Diabetes Father   . Colon cancer Brother 69     Social History   Social History  . Marital status: Married    Spouse name: N/A  . Number of children: 2  . Years of education: 14   Occupational  History  .      retired   Social History Main Topics  . Smoking status: Former Smoker    Types: Cigarettes    Quit date: 07/09/2004  . Smokeless tobacco: Never Used  . Alcohol use 1.8 oz/week    3 Glasses of wine per week  . Drug use: No  . Sexual activity: Yes    Partners: Female   Other Topics Concern  . Not on file   Social History Narrative   HSG 2 years  trade school. married 1965. 2 daughters- '66, '71. 2 grandchldren. 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     Review of Systems  Constitutional: Denies fever, chills, fatigue, or significant weight gain/loss. HENT: Head: Denies headache or neck pain Ears: Denies changes in hearing, ringing in ears, earache, drainage Nose: Denies discharge, stuffiness, itching, nosebleed, sinus pain Throat: Denies sore throat, hoarseness, dry mouth, sores, thrush Eyes: Denies loss/changes in vision, pain, redness, blurry/double vision, flashing lights Cardiovascular: Denies chest pain/discomfort, tightness, palpitations, shortness of breath with activity, difficulty lying down, swelling, sudden awakening with shortness of breath Respiratory: Denies shortness of breath, cough, sputum production, wheezing Gastrointestinal: Denies dysphasia, heartburn, change in appetite, nausea, change in bowel habits, rectal bleeding, constipation, diarrhea, yellow skin or eyes Genitourinary: Denies frequency, urgency, burning/pain, blood in urine, incontinence, change in urinary strength. Musculoskeletal: Denies muscle/joint pain, stiffness, back pain, redness or swelling of joints, trauma Skin: Denies rashes, lumps, itching, dryness, color changes, or hair/nail changes Neurological: Denies dizziness, fainting, seizures, weakness, numbness, tingling, tremor Psychiatric - Denies nervousness, stress, depression or memory loss Endocrine: Denies heat or cold intolerance, sweating, frequent urination, excessive thirst, changes in  appetite Hematologic: Denies ease of bruising or bleeding    Objective:     BP (!) 140/92   Pulse 65   Temp 97.9 F (36.6 C) (Oral)   Resp 16   Ht 5\' 6"  (1.676 m)   Wt 164 lb 12.8 oz (74.8 kg)   SpO2 98%   BMI 26.60 kg/m  Nursing note and vital signs reviewed.  Physical Exam  Constitutional: He is oriented to person, place, and time. He appears well-developed and well-nourished. No distress.  HENT:  Head: Normocephalic.  Right Ear: Hearing, tympanic membrane, external ear and ear canal normal.  Left Ear: Hearing, tympanic membrane, external ear and ear canal normal.  Nose: Nose normal.  Mouth/Throat: Uvula is midline, oropharynx is clear and moist and mucous membranes are normal.  Eyes: Conjunctivae and EOM are normal. Pupils are equal, round, and reactive to light.  Neck: Neck supple. No JVD present. No tracheal deviation present. No thyromegaly present.  Cardiovascular: Normal rate, regular rhythm, normal heart sounds and intact distal pulses.   Pulmonary/Chest: Effort normal and breath sounds normal.  Abdominal: Soft. Bowel sounds are normal. He exhibits no distension and no mass. There is no tenderness. There is no rebound and no guarding.  Musculoskeletal: Normal range of motion. He exhibits no edema or tenderness.  Lymphadenopathy:    He has no cervical adenopathy.  Neurological: He is alert and oriented to person, place, and time. He has normal reflexes. No cranial nerve deficit. He exhibits normal muscle tone. Coordination normal.  Skin: Skin is warm and dry.  Psychiatric: He has a normal mood and affect. His behavior is normal. Judgment and thought content normal.       Assessment & Plan:   During the course of the visit the patient was educated and counseled about appropriate screening and preventive services including:    Pneumococcal vaccine   Influenza vaccine  Prostate cancer screening  Colorectal cancer screening  Diabetes screening  Nutrition  counseling   Diet review for nutrition referral? Yes ____  Not Indicated _X___   Patient Instructions (the written plan) was given to the patient.  Medicare Attestation I have personally reviewed: The patient's medical and social history Their use of alcohol, tobacco or illicit drugs Their current medications and supplements The patient's functional ability including ADLs,fall risks, home safety risks,  cognitive, and hearing and visual impairment Diet and physical activities Evidence for depression or mood disorders  The patient's weight, height, BMI,  have been recorded in the chart.  I have made referrals, counseling, and provided education to the patient based on review of the above and I have provided the patient with a written personalized care plan for preventive services.     [name], [place]   02/05/2016    Problem List Items Addressed This Visit      Cardiovascular and Mediastinum   Essential hypertension    Hypertension appears adequately controlled with current regimen and add goal of 140/90. No adverse side effects or end organ damage noted. Denies worse headache. Continue current dosage of losartan.        Endocrine   Diabetes ([place])    Type 2 diabetes with most recent A1c remain elevated with current regimen. Obtain A1c. Continue current dosage of metformin and a carbose pending A1c results.         Other   Hyperlipidemia    Hyperlipidemia currently managed with simvastatin. Obtain lipid profile check current status. Continue current dosage of simvastatin pending lipid profile results.      Routine general medical examination at a health care facility - Primary    1) Anticipatory Guidance: Discussed importance of wearing a seatbelt while driving and not texting while driving; changing batteries in smoke detector at least once annually; wearing suntan lotion when outside; eating a balanced and moderate diet; getting physical activity at least 30 minutes per  day.  2) Immunizations / Screenings / Labs:  All immunizations are up-to-date per recommendations. Obtain PSA for prostate cancer screening. Obtain A1c for diabetes screen. All other screenings are up-to-date per recommendations. Obtain CBC, CMET, and Lipid profile.  Overall well exam with risk factors for cardiovascular disease including hypertension, hyperlipidemia, and type 2 diabetes. Blood pressure slightly elevated today although patient reports blood pressures at home are significantly lower. Current cholesterol status which check profile. Diabetes status will be checked with A1c. Continue current dosage of medication pending A1c results. Continue healthy lifestyle behaviors are chronic conditions pending blood work.       Relevant Orders   CBC   Comprehensive metabolic panel   Lipid panel   PSA   Hemoglobin A1c   Medicare annual wellness visit, subsequent    Reviewed and updated patient's medical, surgical, family and social history. Medications and allergies were also reviewed. Basic screenings for depression, activities of daily living, hearing, cognition and safety were performed. Provider list was updated and health plan was provided to the patient.        Other Visit Diagnoses   None.

## 2016-02-08 ENCOUNTER — Telehealth: Payer: Self-pay | Admitting: Family

## 2016-02-08 DIAGNOSIS — E118 Type 2 diabetes mellitus with unspecified complications: Secondary | ICD-10-CM

## 2016-02-08 MED ORDER — METFORMIN HCL ER 500 MG PO TB24: 500.0000 mg | ORAL_TABLET | Freq: Every day | ORAL | 0 refills | Status: DC

## 2016-02-08 NOTE — Telephone Encounter (Signed)
I have discontinued the Metformin 750 back to 500 mg. There are two options - the first is to increase the acarbose or the second is to work on nutrition and physical activity. I am okay with either. Follow up in 3 months. If he wants to increase acarbose - 50 mg tid with meals #90 with 2 refills.

## 2016-02-08 NOTE — Telephone Encounter (Signed)
Pt stated he can not take metFORMIN (GLUCOPHAGE-XR) 750 MG 24 hr tablet because side effect of diarrhea and weight loss. Please advise.

## 2016-02-09 MED ORDER — ACARBOSE 50 MG PO TABS: 50.0000 mg | ORAL_TABLET | Freq: Three times a day (TID) | ORAL | 2 refills | Status: DC

## 2016-02-09 NOTE — Telephone Encounter (Signed)
Pt chose to try the increase acarbose. Sent a new rx to pharmacyl

## 2016-02-16 ENCOUNTER — Telehealth: Payer: Self-pay | Admitting: Family

## 2016-02-16 NOTE — Telephone Encounter (Signed)
Pt called in said that he needed a nurse to give him a call, he has an appt about his prostate and he needs some info.  Can you call him when you get a chance.

## 2016-02-19 NOTE — Telephone Encounter (Signed)
Labs faxed to [facility] as well as last visit office notes.

## 2016-02-22 ENCOUNTER — Encounter: Payer: Self-pay | Admitting: Family

## 2016-02-22 DIAGNOSIS — N400 Enlarged prostate without lower urinary tract symptoms: Secondary | ICD-10-CM | POA: Diagnosis not present

## 2016-02-22 DIAGNOSIS — R972 Elevated prostate specific antigen [PSA]: Secondary | ICD-10-CM | POA: Diagnosis not present

## 2016-03-07 DIAGNOSIS — R972 Elevated prostate specific antigen [PSA]: Secondary | ICD-10-CM | POA: Diagnosis not present

## 2016-03-14 ENCOUNTER — Ambulatory Visit (INDEPENDENT_AMBULATORY_CARE_PROVIDER_SITE_OTHER): Payer: Medicare Other

## 2016-03-14 DIAGNOSIS — Z23 Encounter for immunization: Secondary | ICD-10-CM | POA: Diagnosis not present

## 2016-03-15 ENCOUNTER — Other Ambulatory Visit: Payer: Self-pay | Admitting: Family

## 2016-03-15 NOTE — Telephone Encounter (Signed)
Last refill was 11/17/14.

## 2016-03-19 ENCOUNTER — Telehealth: Payer: Self-pay

## 2016-03-19 MED ORDER — LORAZEPAM 0.5 MG PO TABS: 0.5000 mg | ORAL_TABLET | Freq: Every day | ORAL | 0 refills | Status: DC | PRN

## 2016-03-19 NOTE — Telephone Encounter (Signed)
Prescription for lorazepam printed to be faxed.

## 2016-03-19 NOTE — Telephone Encounter (Signed)
Alprazolam PA DENIED Please advise on alternative medication. Thanks

## 2016-03-19 NOTE — Telephone Encounter (Signed)
PA initiated via CoverMyMeds key [place]

## 2016-03-20 NOTE — Telephone Encounter (Signed)
PA required for Lorazepam. Initiated via CoverMyMeds key [facility]

## 2016-03-20 NOTE — Telephone Encounter (Signed)
Lorazepam APPROVED 03/20/2016 through 03/20/2017.

## 2016-03-20 NOTE — Telephone Encounter (Signed)
[facility]ed [facility].../lmb 

## 2016-03-31 ENCOUNTER — Other Ambulatory Visit: Payer: Self-pay | Admitting: Internal Medicine

## 2016-03-31 ENCOUNTER — Other Ambulatory Visit: Payer: Self-pay | Admitting: Family

## 2016-05-01 ENCOUNTER — Telehealth: Payer: Self-pay | Admitting: Family

## 2016-05-01 NOTE — Telephone Encounter (Signed)
Patient called to advise that he is in the donut hole with his insurance. He is not able to afford [place] any longer. I directed him to [facility] patient assistance.   In the meantime, do we have any samples or coupons to offer him? Please give him a call.

## 2016-05-06 ENCOUNTER — Other Ambulatory Visit: Payer: Self-pay | Admitting: Family

## 2016-05-07 NOTE — Telephone Encounter (Signed)
[employer] patient assistance application completed, signed, and mailed per pt request

## 2016-05-15 ENCOUNTER — Other Ambulatory Visit: Payer: Self-pay | Admitting: Family

## 2016-05-15 ENCOUNTER — Other Ambulatory Visit: Payer: Self-pay | Admitting: Internal Medicine

## 2016-05-15 NOTE — Telephone Encounter (Signed)
Last refill was 03/19/16

## 2016-05-15 NOTE — Telephone Encounter (Signed)
Rx faxed

## 2016-05-16 ENCOUNTER — Telehealth: Payer: Self-pay | Admitting: Emergency Medicine

## 2016-05-16 MED ORDER — GLUCOSE BLOOD VI STRP: ORAL_STRIP | 2 refills | Status: DC

## 2016-05-16 NOTE — Telephone Encounter (Signed)
Resent test strips permissible to insurance coverage. We do not have sample test strips.

## 2016-05-16 NOTE — Telephone Encounter (Signed)
Pharmacy called and states that the patients insurance is not covering the patients test strips. He is currently out of his test strips. They are wondering if there are any samples the patient can get? Please advise thanks.

## 2016-05-17 ENCOUNTER — Other Ambulatory Visit: Payer: Self-pay | Admitting: Internal Medicine

## 2016-05-17 NOTE — Telephone Encounter (Signed)
Rec'd call from pt requesting rx for freestyle testing strips. Inform pt [facility]. But his insurance does not cover Freestyle monitor/supplies. Inform pt to contact insurance to see what would be there alternative, and we can send rx on what they prefer...Alex Massey

## 2016-05-21 ENCOUNTER — Other Ambulatory Visit: Payer: Self-pay

## 2016-05-21 ENCOUNTER — Telehealth: Payer: Self-pay | Admitting: *Deleted

## 2016-05-21 MED ORDER — GLUCOSE BLOOD VI STRP: ORAL_STRIP | 5 refills | Status: DC

## 2016-05-21 NOTE — Telephone Encounter (Signed)
Rec'd call pt states [facility] states the DX code need to be attach so he can receive testing strips. Dx was attach to rx that was sent last week. Will resend...Alex Massey

## 2016-05-22 ENCOUNTER — Telehealth: Payer: Self-pay | Admitting: *Deleted

## 2016-05-22 DIAGNOSIS — E119 Type 2 diabetes mellitus without complications: Secondary | ICD-10-CM | POA: Diagnosis not present

## 2016-05-22 MED ORDER — GLUCOSE BLOOD VI STRP: ORAL_STRIP | 5 refills | Status: DC

## 2016-05-22 NOTE — Telephone Encounter (Signed)
Pt left msg on triage stating [facility] need the diagnosis for his testing strips. Called [facility] to verify bcz we [facility]. [facility] spoke w/pharmacist he states they did receive all scripts that was sent, but we have informed pt that his plan requires him to use [facility] for his strips. [facility]...Alex Massey

## 2016-05-28 ENCOUNTER — Other Ambulatory Visit: Payer: Self-pay | Admitting: Family

## 2016-05-28 ENCOUNTER — Telehealth: Payer: Self-pay | Admitting: Family

## 2016-05-28 NOTE — Telephone Encounter (Signed)
Patient is requesting samples of januvia. He is having issues with the pharmacy.

## 2016-05-28 NOTE — Telephone Encounter (Signed)
Samples given.  

## 2016-06-03 ENCOUNTER — Other Ambulatory Visit: Payer: Self-pay | Admitting: Family

## 2016-06-13 DIAGNOSIS — R972 Elevated prostate specific antigen [PSA]: Secondary | ICD-10-CM | POA: Diagnosis not present

## 2016-06-13 DIAGNOSIS — N5201 Erectile dysfunction due to arterial insufficiency: Secondary | ICD-10-CM | POA: Diagnosis not present

## 2016-06-14 ENCOUNTER — Other Ambulatory Visit: Payer: Self-pay | Admitting: Family

## 2016-06-17 ENCOUNTER — Telehealth: Payer: Self-pay | Admitting: *Deleted

## 2016-06-17 NOTE — Telephone Encounter (Signed)
Last refill was 05/15/16

## 2016-06-17 NOTE — Telephone Encounter (Signed)
Rec'd call pt is requesting refill on his Lorazepam. Inform pt per chart med was approved this am and [facility]...Alex Massey

## 2016-06-17 NOTE — Telephone Encounter (Signed)
Rx faxed

## 2016-06-20 ENCOUNTER — Ambulatory Visit (INDEPENDENT_AMBULATORY_CARE_PROVIDER_SITE_OTHER): Payer: Medicare Other | Admitting: Family

## 2016-06-20 DIAGNOSIS — S93491A Sprain of other ligament of right ankle, initial encounter: Secondary | ICD-10-CM | POA: Diagnosis not present

## 2016-06-20 DIAGNOSIS — M5441 Lumbago with sciatica, right side: Secondary | ICD-10-CM | POA: Diagnosis not present

## 2016-06-20 NOTE — Progress Notes (Signed)
Office Visit Note   Patient: Alex Massey           Date of Birth: 1940-08-09           MRN: [id number] Visit Date: 06/20/2016              Requested by: [address], [place], [place] [address] PCP: [name], FNP   Assessment & Plan: Visit Diagnoses:  1. Acute right-sided low back pain with right-sided sciatica   2. Sprain of anterior talofibular ligament of right ankle, initial encounter     Plan: We'll refer him over to Dr. Ernestina Patches with physiatry for [facility] of the lumbar spine. He will follow-up as needed for the ankle sprain.  Follow-Up Instructions: Return if symptoms worsen or fail to improve.   Orders:  Orders Placed This Encounter  Procedures  . Ambulatory referral to Physical Medicine Rehab   No orders of the defined types were placed in this encounter.     Procedures: No procedures performed   Clinical Data: No additional findings.   Subjective: Chief Complaint  Patient presents with  . Right Foot - Pain    Patient is a 75 year old gentleman who is seen today for 2 separate issues.  1. Right foot pain. He states that it is very similar to the pain he has had in the past with numbness and tingling in forefoot and over dorsum of the foot. Complains of numbness and weakness that hinders his ambulation. This is intermittent. States feels similar to previous radicular pain, has had good relief from [facility] in the past. Last injection in Jan of 2016. The pain began 3 weeks ago without injury. It feels "like my foot is exploding" when the pt applies pressure. He states that he does heel cord stretching that he had been advised to do 125 reps twice a day. He is in new balance shoes and an AFO on the right.   2. Right ankle pain. States stepped on a gum ball in his yard a week ago. Lateral ankle aching pain since. No difficulty with ambulation related to this pain.     Review of Systems  Constitutional: Negative for chills and fever.      Objective: Vital Signs: There were no vitals taken for this visit.  Physical Exam  Constitutional: He is oriented to person, place, and time. He appears well-developed and well-nourished.  Pulmonary/Chest: Effort normal.  Musculoskeletal:       Lumbar back: He exhibits no pain and no spasm.       Right foot: There is normal range of motion, no tenderness and no swelling.  Neurological: He is alert and oriented to person, place, and time.  Psychiatric: He has a normal mood and affect.  Nursing note reviewed.   Right Ankle Exam  Swelling: none  Tenderness  The patient is experiencing tenderness in the ATF.    Range of Motion  The patient has normal right ankle ROM.  Tests  Anterior drawer: negative Other  Erythema: absent Sensation: decreased    Back Exam   Tenderness  The patient is experiencing no tenderness.   Range of Motion  The patient has normal back ROM.  Tests  Straight leg raise right: negative Straight leg raise left: negative  Other  Gait: antalgic       Specialty Comments:  No specialty comments available.  Imaging: No results found.   PMFS History: Patient Active Problem List   Diagnosis  Date Noted  . Medicare annual wellness visit, subsequent 02/05/2016  . Hemorrhoid 08/07/2015  . BPH without obstruction/lower urinary tract symptoms 02/01/2015  . Essential hypertension 09/12/2014  . Anxiety state, unspecified 02/09/2014  . Routine general medical examination at a health care facility 01/14/2011  . ERECTILE DYSFUNCTION 04/04/2008  . WEIGHT LOSS 09/03/2007  . Hyperlipidemia 06/26/2007  . Diabetes ([place]) 06/25/2007  . FOOT DROP, RIGHT 06/25/2007  . NEPHROLITHIASIS, HX OF 06/25/2007   Past Medical History:  Diagnosis Date  . DIABETES MELLITUS, TYPE II   . ERECTILE DYSFUNCTION   . FOOT DROP, RIGHT    s/p ESI x 3 at [place] in 2009 and 2016  . HYPERLIPIDEMIA   . NEPHROLITHIASIS, HX OF     Family History  Problem  Relation Age of Onset  . Healthy Mother   . Diabetes Father   . Colon cancer Brother 73    Past Surgical History:  Procedure Laterality Date  . L-spine surgery  [id number]  . LUMBAR EPIDURAL INJECTION  2009, 2006   dr newton   Social History   Occupational History  .      retired   Social History Main Topics  . Smoking status: Former Smoker    Types: Cigarettes    Quit date: 07/09/2004  . Smokeless tobacco: Never Used  . Alcohol use 1.8 oz/week    3 Glasses of wine per week  . Drug use: No  . Sexual activity: Yes    Partners: Female

## 2016-07-02 ENCOUNTER — Other Ambulatory Visit: Payer: Self-pay | Admitting: Family

## 2016-07-05 ENCOUNTER — Other Ambulatory Visit: Payer: Self-pay | Admitting: Internal Medicine

## 2016-07-09 ENCOUNTER — Encounter (INDEPENDENT_AMBULATORY_CARE_PROVIDER_SITE_OTHER): Payer: Self-pay | Admitting: Physical Medicine and Rehabilitation

## 2016-07-09 ENCOUNTER — Encounter (INDEPENDENT_AMBULATORY_CARE_PROVIDER_SITE_OTHER): Payer: Self-pay

## 2016-07-09 ENCOUNTER — Ambulatory Visit (INDEPENDENT_AMBULATORY_CARE_PROVIDER_SITE_OTHER): Payer: Medicare Other | Admitting: Physical Medicine and Rehabilitation

## 2016-07-09 VITALS — BP 152/89 | HR 73 | Temp 97.9°F

## 2016-07-09 DIAGNOSIS — M5116 Intervertebral disc disorders with radiculopathy, lumbar region: Secondary | ICD-10-CM

## 2016-07-09 DIAGNOSIS — M5416 Radiculopathy, lumbar region: Secondary | ICD-10-CM

## 2016-07-09 MED ORDER — LIDOCAINE HCL (PF) 1 % IJ SOLN: 0.3300 mL | Freq: Once | INTRAMUSCULAR | Status: DC

## 2016-07-09 MED ORDER — METHYLPREDNISOLONE ACETATE 80 MG/ML IJ SUSP
80.0000 mg | Freq: Once | INTRAMUSCULAR | Status: AC
  Administered 2016-07-09: 80 mg

## 2016-07-09 NOTE — Procedures (Signed)
Lumbosacral Transforaminal Epidural Steroid Injection - Infraneural Approach with Fluoroscopic Guidance  Patient: Alex Massey      Date of Birth: Sep 24, 1940 MRN: [id number] PCP: [name], FNP      Visit Date: 07/09/2016   Universal Protocol:    Date/Time: 01/02/181:59 PM  Consent Given By: the patient  Position: PRONE   Additional Comments: Vital signs were monitored before and after the procedure. Patient was prepped and draped in the usual sterile fashion. The correct patient, procedure, and site was verified.   Injection Procedure Details:  Procedure Site One Meds Administered:  Meds ordered this encounter  Medications  . lidocaine (PF) (XYLOCAINE) 1 % injection 0.3 mL  . methylPREDNISolone acetate (DEPO-MEDROL) injection 80 mg      Laterality: Right  Location/Site:  L4-L5 L5-S1  Needle size: 22 G  Needle type: Spinal  Needle Placement: Transforaminal  Findings:  -Contrast Used: 2 mL iohexol 180 mg iodine/mL   -Comments: Excellent flow of contrast along the nerve and into the epidural space.  Procedure Details: After squaring off the end-plates of the desired vertebral level to get a true AP view, the C-arm was obliqued to the painful side so that the superior articulating process is positioned about 1/3 the length of the inferior endplate.  The needle was aimed toward the junction of the superior articular process and the transverse process of the inferior vertebrae. The needle's initial entry is in the lower third of the foramen through Kambin's triangle. The soft tissues overlying this target were infiltrated with 2-3 ml. of 1% Lidocaine without Epinephrine.  The spinal needle was then inserted and advanced toward the target using a "trajectory" view along the fluoroscope beam.  Under AP and lateral visualization, the needle was advanced so it did not puncture dura and did not traverse medially beyond the 6 o'clock position of the pedicle. Bi-planar  projections were used to confirm position. Aspiration was confirmed to be negative for CSF and/or blood. A 1-2 ml. volume of Isovue-250 was injected and flow of contrast was noted at each level. Radiographs were obtained for documentation purposes.   After attaining the desired flow of contrast documented above, a 0.5 to 1.0 ml test dose of 0.25% Marcaine was injected into each respective transforaminal space.  The patient was observed for 90 seconds post injection.  After no sensory deficits were reported, and normal lower extremity motor function was noted,   the above injectate was administered so that equal amounts of the injectate were placed at each foramen (level) into the transforaminal epidural space.   Additional Comments:  The patient tolerated the procedure well No complications occurred Dressing: Band-Aid and 2x2 sterile gauze     Post-procedure details: Patient was observed during the procedure. Post-procedure instructions were reviewed.  Patient left the clinic in stable condition.

## 2016-07-09 NOTE — Progress Notes (Signed)
Alex Massey - 76 y.o. male MRN [id number]  Date of birth: [birth year]  Office Visit Note: Visit Date: 07/09/2016 PCP: [name], FNP Referred by: [place], FNP  Subjective: Chief Complaint  Patient presents with  . Lower Back - Pain   HPI: Mr. Meland is a very pleasant 76 year old gentleman I saw almost 2 years ago but remember him quite well. He has been experiencing over the last few months right side lower back pain. Radiating into groin and down right leg to foot. Worse with increased activity. Throbbing pain at night with lying down. Foot numb at times. Gets relief with sitting. At times leg will give way and feels like he is going to fall. Notices this after he has been sitting awhile and goes to stand. His pain is in a fairly classic L5 distribution except for the groin pain. With rotation of the hip is only mild pain in the groin. His biggest complaint is the paresthesias and pain in an L5 distribution into the lateral calf and big toe on the right. Prior MRI evidence did show focal disc herniation with some narrowing at L4-5 in particular. He has been followed by Dondra Prader who suggested repeat epidural injection.    ROS Otherwise per HPI.  Assessment & Plan: Visit Diagnoses:  1. Lumbar radiculopathy   2. Radiculopathy due to lumbar intervertebral disc disorder     Plan: Findings:  Right L5 radicular complaints fairly new onset but a chronic problem. Likely exacerbation of combination of stenosis and may be disc herniation. Repeating right L4 and L5 transforaminal injection diagnostically and therapeutically. If he doesn't get much relief of the pain more severe update the MRI.    Meds & Orders:  Meds ordered this encounter  Medications  . lidocaine (PF) (XYLOCAINE) 1 % injection 0.3 mL  . methylPREDNISolone acetate (DEPO-MEDROL) injection 80 mg    Orders Placed This Encounter  Procedures  . Epidural Steroid injection    Follow-up: Return if  symptoms worsen or fail to improve, 2 weeks.   Procedures: No procedures performed  Lumbosacral Transforaminal Epidural Steroid Injection - Infraneural Approach with Fluoroscopic Guidance  Patient: ALIJA HELLEN      Date of Birth: [birth year] MRN: [id number] PCP: [name], FNP      Visit Date: 07/09/2016   Universal Protocol:    Date/Time: 01/02/181:59 PM  Consent Given By: the patient  Position: PRONE   Additional Comments: Vital signs were monitored before and after the procedure. Patient was prepped and draped in the usual sterile fashion. The correct patient, procedure, and site was verified.   Injection Procedure Details:  Procedure Site One Meds Administered:  Meds ordered this encounter  Medications  . lidocaine (PF) (XYLOCAINE) 1 % injection 0.3 mL  . methylPREDNISolone acetate (DEPO-MEDROL) injection 80 mg      Laterality: Right  Location/Site:  L4-L5 L5-S1  Needle size: 22 G  Needle type: Spinal  Needle Placement: Transforaminal  Findings:  -Contrast Used: 2 mL iohexol 180 mg iodine/mL   -Comments: Excellent flow of contrast along the nerve and into the epidural space.  Procedure Details: After squaring off the end-plates of the desired vertebral level to get a true AP view, the C-arm was obliqued to the painful side so that the superior articulating process is positioned about 1/3 the length of the inferior endplate.  The needle was aimed toward the junction of the superior articular process and the transverse process of the inferior vertebrae. The needle's  initial entry is in the lower third of the foramen through Kambin's triangle. The soft tissues overlying this target were infiltrated with 2-3 ml. of 1% Lidocaine without Epinephrine.  The spinal needle was then inserted and advanced toward the target using a "trajectory" view along the fluoroscope beam.  Under AP and lateral visualization, the needle was advanced so it did not puncture dura  and did not traverse medially beyond the 6 o'clock position of the pedicle. Bi-planar projections were used to confirm position. Aspiration was confirmed to be negative for CSF and/or blood. A 1-2 ml. volume of Isovue-250 was injected and flow of contrast was noted at each level. Radiographs were obtained for documentation purposes.   After attaining the desired flow of contrast documented above, a 0.5 to 1.0 ml test dose of 0.25% Marcaine was injected into each respective transforaminal space.  The patient was observed for 90 seconds post injection.  After no sensory deficits were reported, and normal lower extremity motor function was noted,   the above injectate was administered so that equal amounts of the injectate were placed at each foramen (level) into the transforaminal epidural space.   Additional Comments:  The patient tolerated the procedure well No complications occurred Dressing: Band-Aid and 2x2 sterile gauze     Post-procedure details: Patient was observed during the procedure. Post-procedure instructions were reviewed.  Patient left the clinic in stable condition.     Clinical History: No specialty comments available.  He reports that he quit smoking about 12 years ago. His smoking use included Cigarettes. He has never used smokeless tobacco.   Recent Labs  08/07/15 0913 11/06/15 0904 02/05/16 1046  HGBA1C 8.7* 8.6* 7.5*    Objective:  VS:  HT:    WT:   BMI:     BP:(!) 152/89  HR:73bpm  TEMP:97.9 F (36.6 C)(Oral)  RESP:97 % Physical Exam  Constitutional: He is oriented to person, place, and time.  Musculoskeletal:  Patient ambulates without aid. He has a slightly forward flexed spine. He has good distal strength without any deficits with good strength with EHL.  Neurological: He is alert and oriented to person, place, and time. A sensory deficit is present. He exhibits normal muscle tone. Coordination normal.    Ortho Exam Imaging: No results  found.  Past Medical/Family/Surgical/Social History: Medications & Allergies reviewed per EMR Patient Active Problem List   Diagnosis Date Noted  . Lumbar radiculopathy 07/09/2016  . Radiculopathy due to lumbar intervertebral disc disorder 07/09/2016  . Medicare annual wellness visit, subsequent 02/05/2016  . Hemorrhoid 08/07/2015  . BPH without obstruction/lower urinary tract symptoms 02/01/2015  . Essential hypertension 09/12/2014  . Anxiety state, unspecified 02/09/2014  . Routine general medical examination at a health care facility 01/14/2011  . ERECTILE DYSFUNCTION 04/04/2008  . WEIGHT LOSS 09/03/2007  . Hyperlipidemia 06/26/2007  . Diabetes ([place]) 06/25/2007  . FOOT DROP, RIGHT 06/25/2007  . NEPHROLITHIASIS, HX OF 06/25/2007   Past Medical History:  Diagnosis Date  . DIABETES MELLITUS, TYPE II   . ERECTILE DYSFUNCTION   . FOOT DROP, RIGHT    s/p ESI x 3 at [place] in 2009 and 2016  . HYPERLIPIDEMIA   . NEPHROLITHIASIS, HX OF    Family History  Problem Relation Age of Onset  . Healthy Mother   . Diabetes Father   . Colon cancer Brother 40   Past Surgical History:  Procedure Laterality Date  . L-spine surgery  [id number]  . LUMBAR EPIDURAL INJECTION  2009, 2006  dr Ernestina Patches   Social History   Occupational History  .      retired   Social History Main Topics  . Smoking status: Former Smoker    Types: Cigarettes    Quit date: 07/09/2004  . Smokeless tobacco: Never Used  . Alcohol use 1.8 oz/week    3 Glasses of wine per week  . Drug use: No  . Sexual activity: Yes    Partners: Female

## 2016-07-09 NOTE — Patient Instructions (Signed)

## 2016-07-12 ENCOUNTER — Other Ambulatory Visit: Payer: Self-pay | Admitting: Family

## 2016-07-26 ENCOUNTER — Other Ambulatory Visit: Payer: Self-pay

## 2016-07-26 DIAGNOSIS — E119 Type 2 diabetes mellitus without complications: Secondary | ICD-10-CM | POA: Diagnosis not present

## 2016-07-26 MED ORDER — FREESTYLE UNISTICK II LANCETS MISC: 0 refills | Status: DC

## 2016-08-28 ENCOUNTER — Other Ambulatory Visit: Payer: Self-pay

## 2016-08-28 MED ORDER — FREESTYLE LANCETS MISC: 12 refills | Status: DC

## 2016-09-09 ENCOUNTER — Other Ambulatory Visit: Payer: Self-pay | Admitting: Family

## 2016-09-23 ENCOUNTER — Telehealth: Payer: Self-pay | Admitting: Family

## 2016-09-23 MED ORDER — SITAGLIPTIN PHOSPHATE 100 MG PO TABS: 100.0000 mg | ORAL_TABLET | Freq: Every day | ORAL | 0 refills | Status: DC

## 2016-09-23 NOTE — Telephone Encounter (Signed)
Please inform patient that we unfortunately do not have any samples.

## 2016-09-23 NOTE — Addendum Note (Signed)
Addended by: Delice Bison E on: 09/23/2016 02:10 PM   Modules accepted: Orders

## 2016-09-23 NOTE — Telephone Encounter (Signed)
Sent in rx for 10 days to [facility].

## 2016-09-23 NOTE — Telephone Encounter (Signed)
Pt has run out of his Januvia 100mg  and his Reill isn't until 7 days. Would like to know if he can receive some free samples.

## 2016-10-23 ENCOUNTER — Other Ambulatory Visit: Payer: Self-pay | Admitting: Family

## 2016-12-09 ENCOUNTER — Other Ambulatory Visit: Payer: Self-pay | Admitting: Family

## 2016-12-18 DIAGNOSIS — R972 Elevated prostate specific antigen [PSA]: Secondary | ICD-10-CM | POA: Diagnosis not present

## 2016-12-18 DIAGNOSIS — R3912 Poor urinary stream: Secondary | ICD-10-CM | POA: Diagnosis not present

## 2016-12-18 DIAGNOSIS — N5201 Erectile dysfunction due to arterial insufficiency: Secondary | ICD-10-CM | POA: Diagnosis not present

## 2016-12-23 ENCOUNTER — Other Ambulatory Visit: Payer: Self-pay | Admitting: Family

## 2017-01-10 ENCOUNTER — Other Ambulatory Visit: Payer: Self-pay | Admitting: Family

## 2017-01-20 ENCOUNTER — Other Ambulatory Visit: Payer: Self-pay | Admitting: Family

## 2017-01-21 ENCOUNTER — Other Ambulatory Visit: Payer: Self-pay | Admitting: Family

## 2017-02-05 ENCOUNTER — Encounter: Payer: Medicare Other | Admitting: Family

## 2017-02-06 ENCOUNTER — Ambulatory Visit (INDEPENDENT_AMBULATORY_CARE_PROVIDER_SITE_OTHER): Payer: Medicare Other | Admitting: Family

## 2017-02-06 ENCOUNTER — Encounter: Payer: Self-pay | Admitting: Family

## 2017-02-06 ENCOUNTER — Other Ambulatory Visit: Payer: Self-pay | Admitting: Family

## 2017-02-06 ENCOUNTER — Other Ambulatory Visit (INDEPENDENT_AMBULATORY_CARE_PROVIDER_SITE_OTHER): Payer: Medicare Other

## 2017-02-06 VITALS — BP 138/86 | HR 69 | Temp 98.3°F | Resp 16 | Ht 66.0 in | Wt 162.8 lb

## 2017-02-06 DIAGNOSIS — E782 Mixed hyperlipidemia: Secondary | ICD-10-CM

## 2017-02-06 DIAGNOSIS — I10 Essential (primary) hypertension: Secondary | ICD-10-CM

## 2017-02-06 DIAGNOSIS — E118 Type 2 diabetes mellitus with unspecified complications: Secondary | ICD-10-CM

## 2017-02-06 DIAGNOSIS — Z0000 Encounter for general adult medical examination without abnormal findings: Secondary | ICD-10-CM | POA: Diagnosis not present

## 2017-02-06 LAB — LIPID PANEL
CHOLESTEROL: 141 mg/dL (ref 0–200)
HDL: 55.7 mg/dL (ref >39.00)
LDL Cholesterol: 56 mg/dL (ref 0–99)
NonHDL: 85.44
TRIGLYCERIDES: 148 mg/dL (ref 0.0–149.0)
Total CHOL/HDL Ratio: 3
VLDL: 29.6 mg/dL (ref 0.0–40.0)

## 2017-02-06 LAB — COMPREHENSIVE METABOLIC PANEL
ALBUMIN: 4.5 g/dL (ref 3.5–5.2)
ALK PHOS: 71 U/L (ref 39–117)
ALT: 21 U/L (ref 0–53)
AST: 16 U/L (ref 0–37)
BILIRUBIN TOTAL: 0.7 mg/dL (ref 0.2–1.2)
BUN: 13 mg/dL (ref 6–23)
CALCIUM: 9.7 mg/dL (ref 8.4–10.5)
CHLORIDE: 103 meq/L (ref 96–112)
CO2: 27 mEq/L (ref 19–32)
CREATININE: 0.93 mg/dL (ref 0.40–1.50)
GFR: 101.85 mL/min (ref >60.00)
Glucose, Bld: 155 mg/dL — ABNORMAL HIGH (ref 70–99)
Potassium: 4.3 mEq/L (ref 3.5–5.1)
SODIUM: 138 meq/L (ref 135–145)
TOTAL PROTEIN: 7 g/dL (ref 6.0–8.3)

## 2017-02-06 LAB — CBC
HCT: 41.1 % (ref 39.0–52.0)
Hemoglobin: 13.2 g/dL (ref 13.0–17.0)
MCHC: 32 g/dL (ref 30.0–36.0)
MCV: 80.8 fl (ref 78.0–100.0)
PLATELETS: 145 10*3/uL — AB (ref 150.0–400.0)
RBC: 5.09 Mil/uL (ref 4.22–5.81)
RDW: 14 % (ref 11.5–15.5)
WBC: 4.7 10*3/uL (ref 4.0–10.5)

## 2017-02-06 MED ORDER — ACARBOSE 50 MG PO TABS: ORAL_TABLET | ORAL | 1 refills | Status: DC

## 2017-02-06 NOTE — Assessment & Plan Note (Signed)
1) Anticipatory Guidance: Discussed importance of wearing a seatbelt while driving and not texting while driving; changing batteries in smoke detector at least once annually; wearing suntan lotion when outside; eating a balanced and moderate diet; getting physical activity at least 30 minutes per day.  2) Immunizations / Screenings / Labs:  All immunizations are up-to-date per recommendations. Recommend flu shot when available. PSA completed by urology and noted to be normal per previous office notes. Colon cancer screening is up-to-date per recommendations. All other screenings are up-to-date per recommendations.Obtain CBC, A1c, CMET, and lipid profile.    Overall well exam with risk factors for cardiovascular disease including hypertension, type 2 diabetes, and hyperlipidemia. Chronic medical conditions appear adequately controlled with current medication regimens and no adverse side effects. He exercises regularly and is working on a nutritional intake that is moderate, balance, and varied. Continue healthy lifestyle behaviors and choices. Follow-up prevention exam in 1 year. Follow-up office visit for chronic conditions and pending blood work.

## 2017-02-06 NOTE — Assessment & Plan Note (Addendum)
Blood pressure below goal 140/90 with current medication regimen and no adverse side effects or hypotensive readings. Encouraged to monitor blood pressure at home and follow sodium diet. Continue current dosage of losartan.

## 2017-02-06 NOTE — Assessment & Plan Note (Signed)
Previous A1c of 7.7. Appears stable with no adverse side effects. Continue current dosage of a carbose, glimepiride, and metformin. Pneumovax is up-to-date. Maintained on losartan and simvastatin for CAD risk reduction. Diabetic foot exam completed today. Encouraged complete diabetic eye exam is scheduled. Follow-up pending A1c results.

## 2017-02-06 NOTE — Assessment & Plan Note (Signed)
Currently maintained on simvastatin with no adverse side effects. Obtain lipid profile. Continue current dosage of simvastatin pending lipid profile results.

## 2017-02-06 NOTE — Patient Instructions (Addendum)
Thank you for choosing [facility].  SUMMARY AND INSTRUCTIONS:  Medication:  Your prescription(s) have been submitted to your pharmacy or been printed and provided for you. Please take as directed and contact our office if you believe you are having problem(s) with the medication(s) or have any questions.  Labs:  Please stop by the lab on the lower level of the building for your blood work. Your results will be released to [facility] (or called to you) after review, usually within 72 hours after test completion. If any changes need to be made, you will be notified at that same time.  1.) The lab is open from 7:30am to 5:30 pm Monday-Friday 2.) No appointment is necessary 3.) Fasting (if needed) is 6-8 hours after food and drink; black coffee and water are okay   Follow up:  If your symptoms worsen or fail to improve, please contact our office for further instruction, or in case of emergency go directly to the emergency room at the closest medical facility.    Health Maintenance  Topic Date Due  . FOOT EXAM  08/06/2016  . HEMOGLOBIN A1C  08/07/2016  . OPHTHALMOLOGY EXAM  02/04/2017  . INFLUENZA VACCINE  02/05/2017  . TETANUS/TDAP  01/03/2020  . COLONOSCOPY  01/20/2020  . PNA vac Low Risk Adult  Completed    Health Maintenance, Male A healthy lifestyle and preventive care is important for your health and wellness. Ask your health care provider about what schedule of regular examinations is right for you. What should I know about weight and diet? Eat a Healthy Diet  Eat plenty of vegetables, fruits, whole grains, low-fat dairy products, and lean protein.  Do not eat a lot of foods high in solid fats, added sugars, or salt.  Maintain a Healthy Weight Regular exercise can help you achieve or maintain a healthy weight. You should:  Do at least 150 minutes of exercise each week. The exercise should increase your heart rate and make you sweat (moderate-intensity  exercise).  Do strength-training exercises at least twice a week.  Watch Your Levels of Cholesterol and Blood Lipids  Have your blood tested for lipids and cholesterol every 5 years starting at 75 years of age. If you are at high risk for heart disease, you should start having your blood tested when you are 75 years old. You may need to have your cholesterol levels checked more often if: ? Your lipid or cholesterol levels are high. ? You are older than 75 years of age. ? You are at high risk for heart disease.  What should I know about cancer screening? Many types of cancers can be detected early and may often be prevented. Lung Cancer  You should be screened every year for lung cancer if: ? You are a current smoker who has smoked for at least 30 years. ? You are a former smoker who has quit within the past 15 years.  Talk to your health care provider about your screening options, when you should start screening, and how often you should be screened.  Colorectal Cancer  Routine colorectal cancer screening usually begins at 75 years of age and should be repeated every 5-10 years until you are 75 years old. You may need to be screened more often if early forms of precancerous polyps or small growths are found. Your health care provider may recommend screening at an earlier age if you have risk factors for colon cancer.  Your health care provider may recommend using home  test kits to check for hidden blood in the stool.  A small camera at the end of a tube can be used to examine your colon (sigmoidoscopy or colonoscopy). This checks for the earliest forms of colorectal cancer.  Prostate and Testicular Cancer  Depending on your age and overall health, your health care provider may do certain tests to screen for prostate and testicular cancer.  Talk to your health care provider about any symptoms or concerns you have about testicular or prostate cancer.  Skin Cancer  Check your skin  from head to toe regularly.  Tell your health care provider about any new moles or changes in moles, especially if: ? There is a change in a mole's size, shape, or color. ? You have a mole that is larger than a pencil eraser.  Always use sunscreen. Apply sunscreen liberally and repeat throughout the day.  Protect yourself by wearing long sleeves, pants, a wide-brimmed hat, and sunglasses when outside.  What should I know about heart disease, diabetes, and high blood pressure?  If you are 9-94 years of age, have your blood pressure checked every 3-5 years. If you are 30 years of age or older, have your blood pressure checked every year. You should have your blood pressure measured twice-once when you are at a hospital or clinic, and once when you are not at a hospital or clinic. Record the average of the two measurements. To check your blood pressure when you are not at a hospital or clinic, you can use: ? An automated blood pressure machine at a pharmacy. ? A home blood pressure monitor.  Talk to your health care provider about your target blood pressure.  If you are between 34-60 years old, ask your health care provider if you should take aspirin to prevent heart disease.  Have regular diabetes screenings by checking your fasting blood sugar level. ? If you are at a normal weight and have a low risk for diabetes, have this test once every three years after the age of 1. ? If you are overweight and have a high risk for diabetes, consider being tested at a younger age or more often.  A one-time screening for abdominal aortic aneurysm (AAA) by ultrasound is recommended for men aged 30-75 years who are current or former smokers. What should I know about preventing infection? Hepatitis B If you have a higher risk for hepatitis B, you should be screened for this virus. Talk with your health care provider to find out if you are at risk for hepatitis B infection. Hepatitis C Blood testing is  recommended for:  Everyone born from [birth year] through 1964.  Anyone with known risk factors for hepatitis C.  Sexually Transmitted Diseases (STDs)  You should be screened each year for STDs including gonorrhea and chlamydia if: ? You are sexually active and are younger than 75 years of age. ? You are older than 75 years of age and your health care provider tells you that you are at risk for this type of infection. ? Your sexual activity has changed since you were last screened and you are at an increased risk for chlamydia or gonorrhea. Ask your health care provider if you are at risk.  Talk with your health care provider about whether you are at high risk of being infected with HIV. Your health care provider may recommend a prescription medicine to help prevent HIV infection.  What else can I do?  Schedule regular health, dental, and eye exams.  Stay current with your vaccines (immunizations).  Do not use any tobacco products, such as cigarettes, chewing tobacco, and e-cigarettes. If you need help quitting, ask your health care provider.  Limit alcohol intake to no more than 2 drinks per day. One drink equals 12 ounces of beer, 5 ounces of wine, or 1 ounces of hard liquor.  Do not use street drugs.  Do not share needles.  Ask your health care provider for help if you need support or information about quitting drugs.  Tell your health care provider if you often feel depressed.  Tell your health care provider if you have ever been abused or do not feel safe at home. This information is not intended to replace advice given to you by your health care provider. Make sure you discuss any questions you have with your health care provider. Document Released: 12/21/2007 Document Revised: 02/21/2016 Document Reviewed: 03/28/2015 Elsevier Interactive Patient Education  [employer].

## 2017-02-06 NOTE — Progress Notes (Signed)
Subjective:    Patient ID: Alex Massey, male    DOB: 1941-04-06, 76 y.o.   MRN: [id number]  Chief Complaint  Patient presents with  . CPE    fasting     HPI:  Alex Massey is a 76 y.o. male who presents today for a Medicare Annual Wellness/Physical exam.    1) Health Maintenance -   Diet - Averages about 3 meals per day consisting of a regular diet; Caffeine intake of 2-3 cups per day   Exercise - Continues to walk about 2 miles daily; also walking some hills on occasion.   2) Preventative Exams / Immunizations:  Dental -- Up to date  Vision -- Up to date - scheduled for next week   Health Maintenance  Topic Date Due  . FOOT EXAM  08/06/2016  . HEMOGLOBIN A1C  08/07/2016  . OPHTHALMOLOGY EXAM  02/04/2017  . INFLUENZA VACCINE  02/05/2017  . TETANUS/TDAP  01/03/2020  . COLONOSCOPY  01/20/2020  . PNA vac Low Risk Adult  Completed     Immunization History  Administered Date(s) Administered  . Influenza Split 04/09/2011, 04/13/2012  . Influenza Whole 04/04/2008, 04/19/2009, 04/12/2010  . Influenza, High Dose Seasonal PF 05/13/2013, 03/14/2016  . Influenza,inj,Quad PF,36+ Mos 04/06/2014  . Pneumococcal Conjugate-13 09/12/2014  . Pneumococcal Polysaccharide-23 01/02/2010  . Td 01/02/2010  . Zoster 01/07/2012    RISK FACTORS  Tobacco History  Smoking Status  . Former Smoker  . Types: Cigarettes  . Quit date: 07/09/2004  Smokeless Tobacco  . Never Used     Cardiac risk factors: advanced age (older than 65 for men, 32 for women), diabetes mellitus, dyslipidemia, hypertension and male gender.  Depression Screen  Depression screen [facility] 2/9 02/06/2017  Decreased Interest 0  Down, Depressed, Hopeless 0  PHQ - 2 Score 0     Activities of Daily Living In your present state of health, do you have any difficulty performing the following activities?:  Driving? No Managing money?  No Feeding yourself? No Getting from bed to chair? No Climbing a  flight of stairs? No Preparing food and eating?: No Bathing or showering? No Getting dressed: No Getting to the toilet? No Using the toilet: No Moving around from place to place: No  In the past year have you fallen or had a near fall?: No   Home Safety Has smoke detector and wears seat belts.  No excess sun exposure. Are there smokers in your home (other than you)?  No Do you feel safe at home?  Yes  Hearing Difficulties: No Do you often ask people to speak up or repeat themselves? No Do you experience ringing or noises in your ears? No  Do you have difficulty understanding soft or whispered voices? No    Cognitive Testing  Alert? Yes   Normal Appearance? Yes  Oriented to person? Yes  Place? Yes   Time? Yes  Recall of three objects?  Yes  Can perform simple calculations? Yes  Displays appropriate judgment? Yes  Can read the correct time from a watch face? Yes  Do you feel that you have a problem with memory? No  Do you often misplace items? Forgetful at times - but remembers quickly   Advanced Directives have been discussed with the patient? Yes   Current Physicians/Providers and Suppliers  1. Terri Piedra, FNP - Internal Medicine 2. Festus Aloe, MD - Urology   Indicate any recent Medical Services you may have received from other than [facility]  providers in the past year (date may be approximate).  All answers were reviewed with the patient and necessary referrals were made:  [name], [place]   02/06/2017    Allergies  Allergen Reactions  . Metformin And Related Other (See Comments)    GI upset, diarrhea, wt loss     Outpatient Medications Prior to Visit  Medication Sig Dispense Refill  . aspirin EC 81 MG tablet Take 1 tablet (81 mg total) by mouth daily. 150 tablet 2  . glimepiride (AMARYL) 2 MG tablet Take 1 tablet (2 mg total) by mouth daily with breakfast. Annual appt w/labs id due must see provider for refills 30 tablet 0  . glucose blood (FREESTYLE  TEST STRIPS) test strip Use to check blood sugars twice a day Dx CODE: E11.9 150 each 5  . Lancets (FREESTYLE) lancets Use to check blood sugars 1-4 times daily. Dx Code: E11.9 100 each 12  . LORazepam (ATIVAN) 0.5 MG tablet TAKE 1 TABLET BY MOUTH DAILY AS NEEDED FOR ANXIETY 30 tablet 0  . losartan (COZAAR) 50 MG tablet Take 1 tablet (50 mg total) by mouth daily. Annual appt due in July must see MD for refills 90 tablet 0  . metFORMIN (GLUCOPHAGE-XR) 500 MG 24 hr tablet Take 1 tablet (500 mg total) by mouth daily with breakfast. 90 tablet 0  . sildenafil (VIAGRA) 100 MG tablet Take 0.5-1 tablets (50-100 mg total) by mouth daily as needed for erectile dysfunction. 2 tablet 0  . simvastatin (ZOCOR) 20 MG tablet Take 1 tablet (20 mg total) by mouth at bedtime. Annual appt w/labs are due must see provider for refills 30 tablet 0  . sitaGLIPtin (JANUVIA) 100 MG tablet Take 1 tablet (100 mg total) by mouth daily. 10 tablet 0  . vitamin B-12 (CYANOCOBALAMIN) 1000 MCG tablet Take 1,000 mcg by mouth daily.    Marland Kitchen acarbose (PRECOSE) 50 MG tablet TAKE 1 TABLET(50 MG) BY MOUTH THREE TIMES DAILY WITH MEALS 90 tablet 1  . acarbose (PRECOSE) 50 MG tablet Take 1 tablet (50 mg total) by mouth 3 (three) times daily with meals. Annual appt w/labs is due must have appt for future refills 90 tablet 0   Facility-Administered Medications Prior to Visit  Medication Dose Route Frequency Provider Last Rate Last Dose  . lidocaine (PF) (XYLOCAINE) 1 % injection 0.3 mL  0.3 mL Other Once Magnus Sinning, MD         Past Medical History:  Diagnosis Date  . DIABETES MELLITUS, TYPE II   . ERECTILE DYSFUNCTION   . FOOT DROP, RIGHT    s/p ESI x 3 at [place] in 2009 and 2016  . HYPERLIPIDEMIA   . NEPHROLITHIASIS, HX OF      Past Surgical History:  Procedure Laterality Date  . L-spine surgery  [id number]  . LUMBAR EPIDURAL INJECTION  2009, 2006   dr newton     Family History  Problem Relation Age of Onset  .  Healthy Mother   . Diabetes Father   . Colon cancer Brother 44     Social History   Social History  . Marital status: Married    Spouse name: N/A  . Number of children: 2  . Years of education: 14   Occupational History  .      retired   Social History Main Topics  . Smoking status: Former Smoker    Types: Cigarettes    Quit date: 07/09/2004  . Smokeless tobacco: Never Used  .  Alcohol use 1.8 oz/week    3 Glasses of wine per week  . Drug use: No  . Sexual activity: Yes    Partners: Female   Other Topics Concern  . Not on file   Social History Narrative   HSG 2 years trade school. married 1965. 2 daughters- '66, '71. 2 grandchldren. 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     Review of Systems  Constitutional: Denies fever, chills, fatigue, or significant weight gain/loss. HENT: Head: Denies headache or neck pain Ears: Denies changes in hearing, ringing in ears, earache, drainage Nose: Denies discharge, stuffiness, itching, nosebleed, sinus pain Throat: Denies sore throat, hoarseness, dry mouth, sores, thrush Eyes: Denies loss/changes in vision, pain, redness, blurry/double vision, flashing lights Cardiovascular: Denies chest pain/discomfort, tightness, palpitations, shortness of breath with activity, difficulty lying down, swelling, sudden awakening with shortness of breath Respiratory: Denies shortness of breath, cough, sputum production, wheezing Gastrointestinal: Denies dysphasia, heartburn, change in appetite, nausea, change in bowel habits, rectal bleeding, constipation, diarrhea, yellow skin or eyes Genitourinary: Denies frequency, urgency, burning/pain, blood in urine, incontinence, change in urinary strength. Musculoskeletal: Denies muscle/joint pain, stiffness, back pain, redness or swelling of joints, trauma Skin: Denies rashes, lumps, itching, dryness, color changes, or hair/nail changes Neurological: Denies dizziness, fainting,  seizures, weakness, numbness, tingling, tremor Psychiatric - Denies nervousness, stress, depression or memory loss Endocrine: Denies heat or cold intolerance, sweating, frequent urination, excessive thirst, changes in appetite Hematologic: Denies ease of bruising or bleeding    Objective:     BP 138/86 (BP Location: Left Arm, Patient Position: Sitting, Cuff Size: Normal)   Pulse 69   Temp 98.3 F (36.8 C) (Oral)   Resp 16   Ht 5\' 6"  (1.676 m)   Wt 162 lb 12.8 oz (73.8 kg)   SpO2 97%   BMI 26.28 kg/m  Nursing note and vital signs reviewed.  Physical Exam  Constitutional: He is oriented to person, place, and time. He appears well-developed and well-nourished.  HENT:  Head: Normocephalic.  Right Ear: Hearing, tympanic membrane, external ear and ear canal normal.  Left Ear: Hearing, tympanic membrane, external ear and ear canal normal.  Nose: Nose normal.  Mouth/Throat: Uvula is midline, oropharynx is clear and moist and mucous membranes are normal.  Eyes: Pupils are equal, round, and reactive to light. Conjunctivae and EOM are normal.  Neck: Neck supple. No JVD present. No tracheal deviation present. No thyromegaly present.  Cardiovascular: Normal rate, regular rhythm, normal heart sounds and intact distal pulses.   Pulmonary/Chest: Effort normal and breath sounds normal.  Abdominal: Soft. Bowel sounds are normal. He exhibits no distension and no mass. There is no tenderness. There is no rebound and no guarding.  Musculoskeletal: Normal range of motion. He exhibits no edema or tenderness.  Lymphadenopathy:    He has no cervical adenopathy.  Neurological: He is alert and oriented to person, place, and time. He has normal reflexes. No cranial nerve deficit. He exhibits normal muscle tone. Coordination normal.  Skin: Skin is warm and dry.  Psychiatric: He has a normal mood and affect. His behavior is normal. Judgment and thought content normal.       Assessment & Plan:   During  the course of the visit the patient was educated and counseled about appropriate screening and preventive services including:    Pneumococcal vaccine   Prostate cancer screening  Colorectal cancer screening  Diabetes screening  Glaucoma screening  Nutrition counseling   Diet review for nutrition  referral? Yes ____  Not Indicated _X___   Patient Instructions (the written plan) was given to the patient.  Medicare Attestation I have personally reviewed: The patient's medical and social history Their use of alcohol, tobacco or illicit drugs Their current medications and supplements The patient's functional ability including ADLs,fall risks, home safety risks, cognitive, and hearing and visual impairment Diet and physical activities Evidence for depression or mood disorders  The patient's weight, height, BMI,  have been recorded in the chart.  I have made referrals, counseling, and provided education to the patient based on review of the above and I have provided the patient with a written personalized care plan for preventive services.     Problem List Items Addressed This Visit      Cardiovascular and Mediastinum   Essential hypertension    Blood pressure below goal 140/90 with current medication regimen and no adverse side effects or hypotensive readings. Encouraged to monitor blood pressure at home and follow sodium diet. Continue current dosage of losartan.        Endocrine   Diabetes ([place])    Previous A1c of 7.7. Appears stable with no adverse side effects. Continue current dosage of a carbose, glimepiride, and metformin. Pneumovax is up-to-date. Maintained on losartan and simvastatin for CAD risk reduction. Diabetic foot exam completed today. Encouraged complete diabetic eye exam is scheduled. Follow-up pending A1c results.       Relevant Medications   acarbose (PRECOSE) 50 MG tablet   Other Relevant Orders   Hemoglobin A1c     Other   Hyperlipidemia     Currently maintained on simvastatin with no adverse side effects. Obtain lipid profile. Continue current dosage of simvastatin pending lipid profile results.      Routine general medical examination at a health care facility    1) Anticipatory Guidance: Discussed importance of wearing a seatbelt while driving and not texting while driving; changing batteries in smoke detector at least once annually; wearing suntan lotion when outside; eating a balanced and moderate diet; getting physical activity at least 30 minutes per day.  2) Immunizations / Screenings / Labs:  All immunizations are up-to-date per recommendations. Recommend flu shot when available. PSA completed by urology and noted to be normal per previous office notes. Colon cancer screening is up-to-date per recommendations. All other screenings are up-to-date per recommendations.Obtain CBC, A1c, CMET, and lipid profile.    Overall well exam with risk factors for cardiovascular disease including hypertension, type 2 diabetes, and hyperlipidemia. Chronic medical conditions appear adequately controlled with current medication regimens and no adverse side effects. He exercises regularly and is working on a nutritional intake that is moderate, balance, and varied. Continue healthy lifestyle behaviors and choices. Follow-up prevention exam in 1 year. Follow-up office visit for chronic conditions and pending blood work.      Relevant Orders   CBC   Comprehensive metabolic panel   Lipid panel   Medicare annual wellness visit, subsequent - Primary    Reviewed and updated patient's medical, surgical, family and social history. Medications and allergies were also reviewed. Basic screenings for depression, activities of daily living, hearing, cognition and safety were performed. Provider list was updated and health plan was provided to the patient.           I am having Mr. Wojtkiewicz maintain his vitamin B-12, aspirin EC, sildenafil, metFORMIN,  glucose blood, LORazepam, freestyle, sitaGLIPtin, losartan, simvastatin, glimepiride, and acarbose. We will continue to administer lidocaine (PF).   Meds ordered this  encounter  Medications  . acarbose (PRECOSE) 50 MG tablet    Sig: TAKE 1 TABLET(50 MG) BY MOUTH THREE TIMES DAILY WITH MEALS    Dispense:  90 tablet    Refill:  1     Follow-up: Return in about 3 months (around 05/09/2017), or if symptoms worsen or fail to improve.   [name], FNP

## 2017-02-06 NOTE — Assessment & Plan Note (Signed)
Reviewed and updated patient's medical, surgical, family and social history. Medications and allergies were also reviewed. Basic screenings for depression, activities of daily living, hearing, cognition and safety were performed. Provider list was updated and health plan was provided to the patient.  

## 2017-02-07 ENCOUNTER — Other Ambulatory Visit (INDEPENDENT_AMBULATORY_CARE_PROVIDER_SITE_OTHER): Payer: Medicare Other

## 2017-02-07 DIAGNOSIS — E118 Type 2 diabetes mellitus with unspecified complications: Secondary | ICD-10-CM

## 2017-02-07 LAB — HEMOGLOBIN A1C: HEMOGLOBIN A1C: 7.5 % — AB (ref 4.6–6.5)

## 2017-02-12 ENCOUNTER — Telehealth: Payer: Self-pay | Admitting: Family

## 2017-02-12 DIAGNOSIS — H524 Presbyopia: Secondary | ICD-10-CM | POA: Diagnosis not present

## 2017-02-12 LAB — HM DIABETES EYE EXAM

## 2017-02-12 NOTE — Telephone Encounter (Signed)
Patient came into office wanting to know what his A1c was. He has been informed and I printed him a copy.

## 2017-02-12 NOTE — Telephone Encounter (Signed)
Noted  

## 2017-02-14 ENCOUNTER — Encounter: Payer: Self-pay | Admitting: Family

## 2017-02-19 ENCOUNTER — Telehealth: Payer: Self-pay | Admitting: Family

## 2017-02-24 ENCOUNTER — Other Ambulatory Visit: Payer: Self-pay

## 2017-02-24 ENCOUNTER — Other Ambulatory Visit: Payer: Self-pay | Admitting: Family

## 2017-02-24 MED ORDER — SIMVASTATIN 20 MG PO TABS: 20.0000 mg | ORAL_TABLET | Freq: Every day | ORAL | 1 refills | Status: DC

## 2017-02-24 NOTE — Telephone Encounter (Signed)
Called pharmacy to verify if rx was received. Spoke w/ tech she stated yes and med was picked up on 8/16. Called pt to verify which medication he was pertaining too. Pt states the Glimepiride. Inform pt per pharmacy rx was picked up on 8/16 last Thursday. Pt then started going through his medication bottles, and he stated yes he got it he didn't know that med was picked up...Alex Massey

## 2017-02-24 NOTE — Telephone Encounter (Signed)
Pt called stating the pharmacy told him [name] does not want him taking this med anymore, on his OV on 8/2, [name] said he wants the pt to maintain this med. Pt is asking we call the pharmacy to clear this up.  Please call pharmacy or resend script and call patient

## 2017-03-12 ENCOUNTER — Other Ambulatory Visit: Payer: Self-pay | Admitting: Family

## 2017-03-28 ENCOUNTER — Encounter: Payer: Self-pay | Admitting: Family

## 2017-03-28 ENCOUNTER — Ambulatory Visit (INDEPENDENT_AMBULATORY_CARE_PROVIDER_SITE_OTHER): Payer: Medicare Other | Admitting: Family

## 2017-03-28 VITALS — BP 126/84 | HR 72 | Temp 98.1°F | Resp 16 | Ht 66.0 in | Wt 164.0 lb

## 2017-03-28 DIAGNOSIS — M542 Cervicalgia: Secondary | ICD-10-CM | POA: Insufficient documentation

## 2017-03-28 DIAGNOSIS — Z23 Encounter for immunization: Secondary | ICD-10-CM

## 2017-03-28 NOTE — Patient Instructions (Addendum)
Thank you for choosing [facility].  SUMMARY AND INSTRUCTIONS:  Please continue to take your medications as prescribed.  Ice/moist heat x 20 minutes every other hour as needed.  Stretches and exercises daily.  Recommend starting [place] HBP as needed if symptoms do not improve.    Follow up:  If your symptoms worsen or fail to improve, please contact our office for further instruction, or in case of emergency go directly to the emergency room at the closest medical facility.     Cervical Strain and Sprain Rehab Ask your health care provider which exercises are safe for you. Do exercises exactly as told by your health care provider and adjust them as directed. It is normal to feel mild stretching, pulling, tightness, or discomfort as you do these exercises, but you should stop right away if you feel sudden pain or your pain gets worse.Do not begin these exercises until told by your health care provider. Stretching and range of motion exercises These exercises warm up your muscles and joints and improve the movement and flexibility of your neck. These exercises also help to relieve pain, numbness, and tingling. Exercise A: Cervical side bend  1. Using good posture, sit on a stable chair or stand up. 2. Without moving your shoulders, slowly tilt your left / right ear to your shoulder until you feel a stretch in your neck muscles. You should be looking straight ahead. 3. Hold for __________ seconds. 4. Repeat with the other side of your neck. Repeat __________ times. Complete this exercise __________ times a day. Exercise B: Cervical rotation  1. Using good posture, sit on a stable chair or stand up. 2. Slowly turn your head to the side as if you are looking over your left / right shoulder. ? Keep your eyes level with the ground. ? Stop when you feel a stretch along the side and the back of your neck. 3. Hold for __________ seconds. 4. Repeat this by turning to your other  side. Repeat __________ times. Complete this exercise __________ times a day. Exercise C: Thoracic extension and pectoral stretch 1. Roll a towel or a small blanket so it is about 4 inches (10 cm) in diameter. 2. Lie down on your back on a firm surface. 3. Put the towel lengthwise, under your spine in the middle of your back. It should not be not under your shoulder blades. The towel should line up with your spine from your middle back to your lower back. 4. Put your hands behind your head and let your elbows fall out to your sides. 5. Hold for __________ seconds. Repeat __________ times. Complete this exercise __________ times a day. Strengthening exercises These exercises build strength and endurance in your neck. Endurance is the ability to use your muscles for a long time, even after your muscles get tired. Exercise D: Upper cervical flexion, isometric 1. Lie on your back with a thin pillow behind your head and a small rolled-up towel under your neck. 2. Gently tuck your chin toward your chest and nod your head down to look toward your feet. Do not lift your head off the pillow. 3. Hold for __________ seconds. 4. Release the tension slowly. Relax your neck muscles completely before you repeat this exercise. Repeat __________ times. Complete this exercise __________ times a day. Exercise E: Cervical extension, isometric  1. Stand about 6 inches (15 cm) away from a wall, with your back facing the wall. 2. Place a soft object, about 6-8 inches (15-20  cm) in diameter, between the back of your head and the wall. A soft object could be a small pillow, a ball, or a folded towel. 3. Gently tilt your head back and press into the soft object. Keep your jaw and forehead relaxed. 4. Hold for __________ seconds. 5. Release the tension slowly. Relax your neck muscles completely before you repeat this exercise. Repeat __________ times. Complete this exercise __________ times a day. Posture and body  mechanics  Body mechanics refers to the movements and positions of your body while you do your daily activities. Posture is part of body mechanics. Good posture and healthy body mechanics can help to relieve stress in your body's tissues and joints. Good posture means that your spine is in its natural S-curve position (your spine is neutral), your shoulders are pulled back slightly, and your head is not tipped forward. The following are general guidelines for applying improved posture and body mechanics to your everyday activities. Standing  When standing, keep your spine neutral and keep your feet about hip-width apart. Keep a slight bend in your knees. Your ears, shoulders, and hips should line up.  When you do a task in which you stand in one place for a long time, place one foot up on a stable object that is 2-4 inches (5-10 cm) high, such as a footstool. This helps keep your spine neutral. Sitting   When sitting, keep your spine neutral and your keep feet flat on the floor. Use a footrest, if necessary, and keep your thighs parallel to the floor. Avoid rounding your shoulders, and avoid tilting your head forward.  When working at a desk or a computer, keep your desk at a height where your hands are slightly lower than your elbows. Slide your chair under your desk so you are close enough to maintain good posture.  When working at a computer, place your monitor at a height where you are looking straight ahead and you do not have to tilt your head forward or downward to look at the screen. Resting When lying down and resting, avoid positions that are most painful for you. Try to support your neck in a neutral position. You can use a contour pillow or a small rolled-up towel. Your pillow should support your neck but not push on it. This information is not intended to replace advice given to you by your health care provider. Make sure you discuss any questions you have with your health care  provider. Document Released: 06/24/2005 Document Revised: 02/29/2016 Document Reviewed: 05/31/2015 Elsevier Interactive Patient Education  [employer].

## 2017-03-28 NOTE — Assessment & Plan Note (Signed)
New-onset neck pain with concern for musculoskeletal origin. No red flags concerning for meningitis. Treat conservatively with ice/moist heat and home exercise therapy. Consider treatment for eustachian tube dysfunction although unlikely. Follow-up if symptoms worsen or do not improve.

## 2017-03-28 NOTE — Progress Notes (Signed)
Subjective:    Patient ID: Alex Massey, male    DOB: Sep 27, 1940, 76 y.o.   MRN: [id number]  Chief Complaint  Patient presents with  . Neck Pain    having neck pain that has been going on for a couple of weeks, feels like cold air goes in his ears and down the back of his neck, feels a heaviness in the back of head    HPI:  Alex Massey is a 76 y.o. male who  has a past medical history of DIABETES MELLITUS, TYPE II; ERECTILE DYSFUNCTION; FOOT DROP, RIGHT; HYPERLIPIDEMIA; and NEPHROLITHIASIS, HX OF. and presents today for an acute office visit.  This is a new problem. Associated symptom of pain located in his neck has been going on for a couple of weeks. Describes a feeling like cold air is going through both his ears and down the back of neck more localized on the right side. Aggravated by moving his head. Course of the symptoms are generally worsening since initial onset. No fevers or cold/flu symptoms. No trauma or injury. Range of motion is normal.   Allergies  Allergen Reactions  . Metformin And Related Other (See Comments)    GI upset, diarrhea, wt loss      Outpatient Medications Prior to Visit  Medication Sig Dispense Refill  . acarbose (PRECOSE) 50 MG tablet TAKE 1 TABLET(50 MG) BY MOUTH THREE TIMES DAILY WITH MEALS 90 tablet 1  . aspirin EC 81 MG tablet Take 1 tablet (81 mg total) by mouth daily. 150 tablet 2  . glimepiride (AMARYL) 2 MG tablet Take 1 tablet by mouth daily with breakfast. 90 tablet 0  . glucose blood (FREESTYLE TEST STRIPS) test strip Use to check blood sugars twice a day Dx CODE: E11.9 150 each 5  . Lancets (FREESTYLE) lancets Use to check blood sugars 1-4 times daily. Dx Code: E11.9 100 each 12  . LORazepam (ATIVAN) 0.5 MG tablet TAKE 1 TABLET BY MOUTH DAILY AS NEEDED FOR ANXIETY 30 tablet 0  . losartan (COZAAR) 50 MG tablet Take 1 tablet by mouth daily. 90 tablet 2  . metFORMIN (GLUCOPHAGE-XR) 500 MG 24 hr tablet Take 1 tablet (500 mg total)  by mouth daily with breakfast. 90 tablet 0  . sildenafil (VIAGRA) 100 MG tablet Take 0.5-1 tablets (50-100 mg total) by mouth daily as needed for erectile dysfunction. 2 tablet 0  . simvastatin (ZOCOR) 20 MG tablet Take 1 tablet (20 mg total) by mouth at bedtime. 90 tablet 1  . sitaGLIPtin (JANUVIA) 100 MG tablet Take 1 tablet (100 mg total) by mouth daily. 10 tablet 0  . vitamin B-12 (CYANOCOBALAMIN) 1000 MCG tablet Take 1,000 mcg by mouth daily.     Facility-Administered Medications Prior to Visit  Medication Dose Route Frequency Provider Last Rate Last Dose  . lidocaine (PF) (XYLOCAINE) 1 % injection 0.3 mL  0.3 mL Other Once Magnus Sinning, MD          Past Surgical History:  Procedure Laterality Date  . L-spine surgery  [id number]  . LUMBAR EPIDURAL INJECTION  2009, 2006   dr newton      Past Medical History:  Diagnosis Date  . DIABETES MELLITUS, TYPE II   . ERECTILE DYSFUNCTION   . FOOT DROP, RIGHT    s/p ESI x 3 at [place] in 2009 and 2016  . HYPERLIPIDEMIA   . NEPHROLITHIASIS, HX OF       Review of Systems  Constitutional: Negative for  chills and fever.  Respiratory: Negative for chest tightness and shortness of breath.   Cardiovascular: Negative for chest pain, palpitations and leg swelling.  Musculoskeletal: Positive for neck pain and neck stiffness.  Neurological: Positive for headaches.      Objective:    BP 126/84 (BP Location: Left Arm, Patient Position: Sitting, Cuff Size: Large)   Pulse 72   Temp 98.1 F (36.7 C) (Oral)   Resp 16   Ht 5\' 6"  (1.676 m)   Wt 164 lb (74.4 kg)   SpO2 96%   BMI 26.47 kg/m  Nursing note and vital signs reviewed.  Physical Exam  Constitutional: He is oriented to person, place, and time. He appears well-developed and well-nourished. No distress.  HENT:  Right Ear: Hearing, tympanic membrane, external ear and ear canal normal.  Left Ear: Hearing, tympanic membrane, external ear and ear canal normal.  Nose:  Right sinus exhibits no maxillary sinus tenderness and no frontal sinus tenderness. Left sinus exhibits no maxillary sinus tenderness and no frontal sinus tenderness.  Mouth/Throat: Uvula is midline, oropharynx is clear and moist and mucous membranes are normal.  Neck: Neck supple. No thyromegaly present.  No obvious deformity, discoloration, or edema. Tenderness without crepitus or deformity of the transverse processes and surrounding musculature of the cervical spine. Range of motion decreased in left rotation and right lateral bending. Negative cervical compression test.  Cardiovascular: Normal rate, regular rhythm, normal heart sounds and intact distal pulses.   Pulmonary/Chest: Effort normal and breath sounds normal.  Lymphadenopathy:    He has no cervical adenopathy.  Neurological: He is alert and oriented to person, place, and time.  Skin: Skin is warm and dry.  Psychiatric: He has a normal mood and affect. His behavior is normal. Judgment and thought content normal.       Assessment & Plan:   Problem List Items Addressed This Visit      Other   Neck pain - Primary    New-onset neck pain with concern for musculoskeletal origin. No red flags concerning for meningitis. Treat conservatively with ice/moist heat and home exercise therapy. Consider treatment for eustachian tube dysfunction although unlikely. Follow-up if symptoms worsen or do not improve.       Other Visit Diagnoses    Need for influenza vaccination       Relevant Orders   Flu vaccine HIGH DOSE PF (Fluzone High dose) (Completed)       I am having Mr. Hermiz maintain his vitamin B-12, aspirin EC, sildenafil, metFORMIN, glucose blood, LORazepam, freestyle, sitaGLIPtin, acarbose, glimepiride, simvastatin, and losartan. We will continue to administer lidocaine (PF).   Follow-up: Return if symptoms worsen or fail to improve.  [name], FNP

## 2017-03-31 ENCOUNTER — Other Ambulatory Visit: Payer: Self-pay | Admitting: Family

## 2017-04-04 ENCOUNTER — Telehealth: Payer: Self-pay | Admitting: Family

## 2017-04-04 MED ORDER — SITAGLIPTIN PHOSPHATE 100 MG PO TABS: 100.0000 mg | ORAL_TABLET | Freq: Every day | ORAL | 0 refills | Status: DC

## 2017-04-04 NOTE — Telephone Encounter (Signed)
Pt called in and would like to know if he could get a few sample of sitaGLIPtin (JANUVIA) 100 MG tablet [[id number]]   Until his meds come in.  He only has one left

## 2017-04-04 NOTE — Telephone Encounter (Signed)
Notified pt sample ready for pick-up...Alex Massey

## 2017-04-07 DIAGNOSIS — E119 Type 2 diabetes mellitus without complications: Secondary | ICD-10-CM | POA: Diagnosis not present

## 2017-04-17 ENCOUNTER — Telehealth: Payer: Self-pay

## 2017-04-17 NOTE — Telephone Encounter (Signed)
The [employer] patient assistance program enrollment form has been filled out and sent in the mail.

## 2017-05-21 ENCOUNTER — Other Ambulatory Visit: Payer: Self-pay | Admitting: *Deleted

## 2017-05-21 MED ORDER — GLIMEPIRIDE 2 MG PO TABS: ORAL_TABLET | ORAL | 1 refills | Status: DC

## 2017-05-21 NOTE — Telephone Encounter (Signed)
Rec'd fax pt requesting refills on his Glimepiride 2 mg. Appt has been made w/[name] for 02/09/18. Sent refill to [facility]...Alex Massey

## 2017-06-10 ENCOUNTER — Telehealth: Payer: Self-pay | Admitting: Family

## 2017-06-10 NOTE — Telephone Encounter (Signed)
Copied from [place] [id number]. Topic: Quick Communication - Rx Refill/Question >> Jun 10, 2017 10:59 AM Synthia Innocent wrote: Has the patient contacted their pharmacy? Yes.     (Agent: If no, request that the patient contact the pharmacy for the refill.)   Preferred Pharmacy (with phone number or street name): [facility] on [address]   Agent: Please be advised that RX refills may take up to 3 business days. We ask that you follow-up with your pharmacy. Requesting refill on acarbose (PRECOSE) 50 MG tablet

## 2017-06-11 NOTE — Telephone Encounter (Signed)
Pt need to move appt up per [name] last notes he issuppose to be seen (Return in about 3 months (around 05/09/2017). She is not able to approve if not seen sooner. Pls call pt to make appt sooner...Alex Massey

## 2017-06-11 NOTE — Telephone Encounter (Signed)
Needs Acarbose refill; last ov 03/2017 with Terri Piedra; next appt. 02/2018 with Tama [facility]; is she willing to do a refill since Terri Piedra is no longer at [facility]

## 2017-06-11 NOTE — Telephone Encounter (Signed)
Pt scheduled 12/7

## 2017-06-12 ENCOUNTER — Other Ambulatory Visit: Payer: Self-pay

## 2017-06-12 MED ORDER — ACARBOSE 50 MG PO TABS: ORAL_TABLET | ORAL | 0 refills | Status: DC

## 2017-06-13 ENCOUNTER — Ambulatory Visit: Payer: Medicare Other | Admitting: Nurse Practitioner

## 2017-06-13 ENCOUNTER — Other Ambulatory Visit (INDEPENDENT_AMBULATORY_CARE_PROVIDER_SITE_OTHER): Payer: Medicare Other

## 2017-06-13 ENCOUNTER — Encounter: Payer: Self-pay | Admitting: Nurse Practitioner

## 2017-06-13 VITALS — BP 140/86 | HR 79 | Temp 97.9°F | Resp 16 | Ht 66.0 in | Wt 164.0 lb

## 2017-06-13 DIAGNOSIS — E118 Type 2 diabetes mellitus with unspecified complications: Secondary | ICD-10-CM | POA: Diagnosis not present

## 2017-06-13 DIAGNOSIS — I10 Essential (primary) hypertension: Secondary | ICD-10-CM

## 2017-06-13 LAB — BASIC METABOLIC PANEL
BUN: 12 mg/dL (ref 6–23)
CALCIUM: 9.8 mg/dL (ref 8.4–10.5)
CO2: 28 meq/L (ref 19–32)
CREATININE: 0.82 mg/dL (ref 0.40–1.50)
Chloride: 103 mEq/L (ref 96–112)
GFR: 117.66 mL/min (ref >60.00)
GLUCOSE: 140 mg/dL — AB (ref 70–99)
Potassium: 4.1 mEq/L (ref 3.5–5.1)
SODIUM: 138 meq/L (ref 135–145)

## 2017-06-13 LAB — HEMOGLOBIN A1C: HEMOGLOBIN A1C: 8.2 % — AB (ref 4.6–6.5)

## 2017-06-13 MED ORDER — ACARBOSE 50 MG PO TABS: ORAL_TABLET | ORAL | 2 refills | Status: DC

## 2017-06-13 NOTE — Assessment & Plan Note (Signed)
Maintained on januvia, acarbose. Reports he tolerates the medications well.  Recent readings in 200s over past few weeks at home, which are higher than normal. He says he has been taking his medications as prescribed but has not been walking as much due to cold weather. He plans to resume walking. Will maintain current dosage of medications pending labs today. - Basic metabolic panel; Future - Hemoglobin A1c; Future RTC in 6 months for routine F/u

## 2017-06-13 NOTE — Addendum Note (Signed)
Addended by: Delice Bison E on: 06/13/2017 02:27 PM   Modules accepted: Orders

## 2017-06-13 NOTE — Patient Instructions (Signed)
Please head downstairs for lab work.  Id like to see you back in about 6 months so we can see how your blood pressure and diabetes are doing.  Keep up the good work with your exercise.  It was nice to meet you. Thanks for letting me take care of you today :)

## 2017-06-13 NOTE — Assessment & Plan Note (Signed)
Blood pressure maintained below current goal of 140/90 on losartan. Denies adverse medication effects. Denies headaches, vision changes, chest pain, shortness of breath. Encouraged maintenance of healthy, reduced sodium diet and exercise. He'll continue current dosage of medications. Follow up in 6 months - Basic metabolic panel; Future today.

## 2017-06-13 NOTE — Progress Notes (Signed)
Subjective:    Patient ID: Alex Massey, male    DOB: October 09, 1940, 76 y.o.   MRN: [id number]  HPI Alex Massey is a 76 yo male who presents today to establish care. He is transferring to me from another provider in the same clinic.  Diabetes-maintained on januvia, acarbose. Takes daily with no adverse effects He checked his blood sugars at home- some 200s readings this week. Denies diaphoresis, tremor, polydipsia, polyphagia.  Lab Results  Component Value Date   HGBA1C 7.5 (H) 02/07/2017    Hypertension-maintained on losartan. Reports he tolerates the medication well. Does not check BP readings at home. He denies headaches, vision changes, chest pain, shortness of breath. He walk 2 miles a day.  BP Readings from Last 3 Encounters:  06/13/17 140/86  03/28/17 126/84  02/06/17 138/86    Review of Systems  See HPI  Past Medical History:  Diagnosis Date  . DIABETES MELLITUS, TYPE II   . ERECTILE DYSFUNCTION   . FOOT DROP, RIGHT    s/p ESI x 3 at [place] in 2009 and 2016  . HYPERLIPIDEMIA   . NEPHROLITHIASIS, HX OF      Social History   Socioeconomic History  . Marital status: Married    Spouse name: Not on file  . Number of children: 2  . Years of education: 86  . Highest education level: Not on file  Social Needs  . Financial resource strain: Not on file  . Food insecurity - worry: Not on file  . Food insecurity - inability: Not on file  . Transportation needs - medical: Not on file  . Transportation needs - non-medical: Not on file  Occupational History    Comment: retired  Tobacco Use  . Smoking status: Former Smoker    Types: Cigarettes    Last attempt to quit: 07/09/2004    Years since quitting: 12.9  . Smokeless tobacco: Never Used  Substance and Sexual Activity  . Alcohol use: Yes    Alcohol/week: 1.8 oz    Types: 3 Glasses of wine per week  . Drug use: No  . Sexual activity: Yes    Partners: Female  Other Topics Concern  . Not on file    Social History Narrative   HSG 2 years trade school. married 1965. 2 daughters- '66, '71. 2 grandchldren. 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    Past Surgical History:  Procedure Laterality Date  . L-spine surgery  [id number]  . LUMBAR EPIDURAL INJECTION  2009, 2006   dr newton    Family History  Problem Relation Age of Onset  . Healthy Mother   . Diabetes Father   . Colon cancer Brother 66    Allergies  Allergen Reactions  . Metformin And Related Other (See Comments)    GI upset, diarrhea, wt loss    Current Outpatient Medications on File Prior to Visit  Medication Sig Dispense Refill  . acarbose (PRECOSE) 50 MG tablet TAKE 1 TABLET(50 MG) BY MOUTH THREE TIMES DAILY WITH MEALS 90 tablet 0  . aspirin EC 81 MG tablet Take 1 tablet (81 mg total) by mouth daily. 150 tablet 2  . glucose blood (FREESTYLE TEST STRIPS) test strip Use to check blood sugars twice a day Dx CODE: E11.9 150 each 5  . Lancets (FREESTYLE) lancets Use to check blood sugars 1-4 times daily. Dx Code: E11.9 100 each 12  . losartan (COZAAR) 50 MG tablet Take 1 tablet by mouth daily.  90 tablet 2  . sildenafil (VIAGRA) 100 MG tablet Take 0.5-1 tablets (50-100 mg total) by mouth daily as needed for erectile dysfunction. 2 tablet 0  . simvastatin (ZOCOR) 20 MG tablet Take 1 tablet (20 mg total) by mouth at bedtime. 90 tablet 1  . sitaGLIPtin (JANUVIA) 100 MG tablet Take 1 tablet (100 mg total) by mouth daily. 10 tablet 0  . vitamin B-12 (CYANOCOBALAMIN) 1000 MCG tablet Take 1,000 mcg by mouth daily.    Marland Kitchen glimepiride (AMARYL) 2 MG tablet Take 1 tablet by mouth daily with breakfast. 90 tablet 1   Current Facility-Administered Medications on File Prior to Visit  Medication Dose Route Frequency Provider Last Rate Last Dose  . lidocaine (PF) (XYLOCAINE) 1 % injection 0.3 mL  0.3 mL Other Once Magnus Sinning, MD        BP 140/86 (BP Location: Left Arm, Patient Position: Sitting, Cuff  Size: Large)   Pulse 79   Temp 97.9 F (36.6 C) (Oral)   Resp 16   Ht 5\' 6"  (1.676 m)   Wt 164 lb (74.4 kg)   SpO2 98%   BMI 26.47 kg/m      Objective:   Physical Exam  Constitutional: He is oriented to person, place, and time. He appears well-developed and well-nourished. No distress.  HENT:  Head: Normocephalic and atraumatic.  Cardiovascular: Normal rate, regular rhythm, normal heart sounds and intact distal pulses.  Pulmonary/Chest: Effort normal and breath sounds normal.  Musculoskeletal: He exhibits no edema.  Neurological: He is alert and oriented to person, place, and time. Coordination normal.  Skin: Skin is warm and dry.  Psychiatric: He has a normal mood and affect. Judgment and thought content normal.      Assessment & Plan:

## 2017-08-29 ENCOUNTER — Other Ambulatory Visit: Payer: Self-pay

## 2017-08-29 MED ORDER — SIMVASTATIN 20 MG PO TABS: 20.0000 mg | ORAL_TABLET | Freq: Every day | ORAL | 1 refills | Status: DC

## 2017-09-26 ENCOUNTER — Encounter: Payer: Self-pay | Admitting: Nurse Practitioner

## 2017-09-26 ENCOUNTER — Other Ambulatory Visit (INDEPENDENT_AMBULATORY_CARE_PROVIDER_SITE_OTHER): Payer: Medicare Other

## 2017-09-26 ENCOUNTER — Ambulatory Visit: Payer: Medicare Other | Admitting: Nurse Practitioner

## 2017-09-26 VITALS — BP 126/74 | HR 66 | Temp 97.7°F | Resp 16 | Ht 66.0 in | Wt 164.0 lb

## 2017-09-26 DIAGNOSIS — E782 Mixed hyperlipidemia: Secondary | ICD-10-CM | POA: Diagnosis not present

## 2017-09-26 DIAGNOSIS — M79671 Pain in right foot: Secondary | ICD-10-CM

## 2017-09-26 DIAGNOSIS — E118 Type 2 diabetes mellitus with unspecified complications: Secondary | ICD-10-CM

## 2017-09-26 LAB — HEMOGLOBIN A1C: Hgb A1c MFr Bld: 7.6 % — ABNORMAL HIGH (ref 4.6–6.5)

## 2017-09-26 LAB — URIC ACID: Uric Acid, Serum: 4.9 mg/dL (ref 4.0–7.8)

## 2017-09-26 NOTE — Patient Instructions (Addendum)
Please head downstairs for lab work.  We will decide when you should come back after we get your lab work.  It was good to see you. Thanks for letting me take care of you today :)   Mediterranean Diet A Mediterranean diet refers to food and lifestyle choices that are based on the traditions of countries located on the [employer]. This way of eating has been shown to help prevent certain conditions and improve outcomes for people who have chronic diseases, like kidney disease and heart disease. What are tips for following this plan? Lifestyle  Cook and eat meals together with your family, when possible.  Drink enough fluid to keep your urine clear or pale yellow.  Be physically active every day. This includes: ? Aerobic exercise like running or swimming. ? Leisure activities like gardening, walking, or housework.  Get 7-8 hours of sleep each night.  If recommended by your health care provider, drink red wine in moderation. This means 1 glass a day for nonpregnant women and 2 glasses a day for men. A glass of wine equals 5 oz (150 mL). Reading food labels  Check the serving size of packaged foods. For foods such as rice and pasta, the serving size refers to the amount of cooked product, not dry.  Check the total fat in packaged foods. Avoid foods that have saturated fat or trans fats.  Check the ingredients list for added sugars, such as corn syrup. Shopping  At the grocery store, buy most of your food from the areas near the walls of the store. This includes: ? Fresh fruits and vegetables (produce). ? Grains, beans, nuts, and seeds. Some of these may be available in unpackaged forms or large amounts (in bulk). ? Fresh seafood. ? Poultry and eggs. ? Low-fat dairy products.  Buy whole ingredients instead of prepackaged foods.  Buy fresh fruits and vegetables in-season from local farmers markets.  Buy frozen fruits and vegetables in resealable bags.  If you do not have  access to quality fresh seafood, buy precooked frozen shrimp or canned fish, such as tuna, salmon, or sardines.  Buy small amounts of raw or cooked vegetables, salads, or olives from the deli or salad bar at your store.  Stock your pantry so you always have certain foods on hand, such as olive oil, canned tuna, canned tomatoes, rice, pasta, and beans. Cooking  Cook foods with extra-virgin olive oil instead of using butter or other vegetable oils.  Have meat as a side dish, and have vegetables or grains as your main dish. This means having meat in small portions or adding small amounts of meat to foods like pasta or stew.  Use beans or vegetables instead of meat in common dishes like chili or lasagna.  Experiment with different cooking methods. Try roasting or broiling vegetables instead of steaming or sauteing them.  Add frozen vegetables to soups, stews, pasta, or rice.  Add nuts or seeds for added healthy fat at each meal. You can add these to yogurt, salads, or vegetable dishes.  Marinate fish or vegetables using olive oil, lemon juice, garlic, and fresh herbs. Meal planning  Plan to eat 1 vegetarian meal one day each week. Try to work up to 2 vegetarian meals, if possible.  Eat seafood 2 or more times a week.  Have healthy snacks readily available, such as: ? Vegetable sticks with hummus. ? [place] yogurt. ? Fruit and nut trail mix.  Eat balanced meals throughout the week. This includes: ?  Fruit: 2-3 servings a day ? Vegetables: 4-5 servings a day ? Low-fat dairy: 2 servings a day ? Fish, poultry, or lean meat: 1 serving a day ? Beans and legumes: 2 or more servings a week ? Nuts and seeds: 1-2 servings a day ? Whole grains: 6-8 servings a day ? Extra-virgin olive oil: 3-4 servings a day  Limit red meat and sweets to only a few servings a month What are my food choices?  Mediterranean diet ? Recommended ? Grains: Whole-grain pasta. Brown rice. Bulgar wheat. Polenta.  Couscous. Whole-wheat bread. [place]. ? Vegetables: Artichokes. Beets. Broccoli. Cabbage. Carrots. Eggplant. Green beans. Chard. Kale. Spinach. Onions. Leeks. Peas. Squash. Tomatoes. Peppers. Radishes. ? Fruits: Apples. Apricots. Avocado. Berries. Bananas. Cherries. Dates. Figs. Grapes. Lemons. Melon. Oranges. Peaches. Plums. Pomegranate. ? Meats and other protein foods: Beans. Almonds. Sunflower seeds. Pine nuts. Peanuts. [place]. Salmon. Scallops. Shrimp. [place]. Tilapia. Clams. Oysters. Eggs. ? Dairy: Low-fat milk. Cheese. Greek yogurt. ? Beverages: Water. Red wine. Herbal tea. ? Fats and oils: Extra virgin olive oil. Avocado oil. Grape seed oil. ? Sweets and desserts: [place] yogurt with honey. Baked apples. Poached pears. Trail mix. ? Seasoning and other foods: Basil. Cilantro. Coriander. Cumin. Mint. Parsley. Sage. Rosemary. Tarragon. Garlic. Oregano. Thyme. Pepper. Balsalmic vinegar. Tahini. Hummus. Tomato sauce. Olives. Mushrooms. ? Limit these ? Grains: Prepackaged pasta or rice dishes. Prepackaged cereal with added sugar. ? Vegetables: Deep fried potatoes (french fries). ? Fruits: Fruit canned in syrup. ? Meats and other protein foods: Beef. Pork. Lamb. Poultry with skin. Hot dogs. Bernie[place] Salines. ? Dairy: Ice cream. Sour cream. Whole milk. ? Beverages: Juice. Sugar-sweetened soft drinks. Beer. Liquor and spirits. ? Fats and oils: Butter. Canola oil. Vegetable oil. Beef fat (tallow). Lard. ? Sweets and desserts: Cookies. Cakes. Pies. Candy. ? Seasoning and other foods: Mayonnaise. Premade sauces and marinades. ? The items listed may not be a complete list. Talk with your dietitian about what dietary choices are right for you. Summary  The Mediterranean diet includes both food and lifestyle choices.  Eat a variety of fresh fruits and vegetables, beans, nuts, seeds, and whole grains.  Limit the amount of red meat and sweets that you eat.  Talk with your health care provider about whether  it is safe for you to drink red wine in moderation. This means 1 glass a day for nonpregnant women and 2 glasses a day for men. A glass of wine equals 5 oz (150 mL). This information is not intended to replace advice given to you by your health care provider. Make sure you discuss any questions you have with your health care provider. Document Released: 02/15/2016 Document Revised: 03/19/2016 Document Reviewed: 02/15/2016 Elsevier Interactive Patient Education  [employer].

## 2017-09-26 NOTE — Assessment & Plan Note (Signed)
Stable, continue current medications Lipid panel up to date

## 2017-09-26 NOTE — Assessment & Plan Note (Signed)
On chart review, it appears his Cel[facility] Gentile has not been filled since last year. Will Update A1c today and F/U with recommendations, clarify medications with patient Discussed the role of healthy diet and exercise in the management of DM, See AVS for additional information provided to patient - Hemoglobin A1c; Future

## 2017-09-26 NOTE — Progress Notes (Signed)
Name: Alex Massey   MRN: [id number]    DOB: 1941-06-12   Date:09/26/2017       Progress Note  Subjective  Chief Complaint  Chief Complaint  Patient presents with  . Follow-up    diabetes, right foot issues    HPI  Diabetes- maintained on januvia 100 daily, acarbose 50 TID His last A1C was a little elevated but he did not want to make medication changes, wanted to see if he could improve glucose control by increasing his activity Reports he has been walking most days since our last visit. He also Reports daily medication compliance without adverse medication effects. He checks his blood sugars about three times a week - fasting in the morning- readings around 150. Denies tremor, diaphoresis, polyuria, polydipsia, polyphagia.  Lab Results  Component Value Date   HGBA1C 8.2 (H) 06/13/2017   Cholesterol- maintained on simvastatin 20 daily Reports daily medication compliance without adverse medication effects including myalgias. Tries to follow a healthy diet, his wife is a Marine scientist and cooks meals with vegetables at home  Lab Results  Component Value Date   CHOL 141 02/06/2017   HDL 55.70 02/06/2017   LDLCALC 56 02/06/2017   TRIG 148.0 02/06/2017   CHOLHDL 3 02/06/2017    Right Foot pain- This is a chronic problem He first noticed the pain after having a back surgery 15-20 years ago The pain is intermittent He describes as an aching pain and numbness in his right foot, especially his right great toe Recently the pain has made it hard to drive because it hurts to push the gas pedal He is not having the pain today He does report history of gout but has not had flare in many years Denies weakness, falls, redness, bruising, swelling, wounds, injuries to foot.   Patient Active Problem List   Diagnosis Date Noted  . Neck pain 03/28/2017  . Lumbar radiculopathy 07/09/2016  . Radiculopathy due to lumbar intervertebral disc disorder 07/09/2016  . Medicare annual wellness  visit, subsequent 02/05/2016  . Hemorrhoid 08/07/2015  . BPH without obstruction/lower urinary tract symptoms 02/01/2015  . Essential hypertension 09/12/2014  . Anxiety state, unspecified 02/09/2014  . Routine general medical examination at a health care facility 01/14/2011  . ERECTILE DYSFUNCTION 04/04/2008  . Hyperlipidemia 06/26/2007  . Diabetes ([place]) 06/25/2007  . NEPHROLITHIASIS, HX OF 06/25/2007    Past Surgical History:  Procedure Laterality Date  . L-spine surgery  [id number]  . LUMBAR EPIDURAL INJECTION  2009, 2006   dr newton    Family History  Problem Relation Age of Onset  . Healthy Mother   . Diabetes Father   . Colon cancer Brother 63    Social History   Socioeconomic History  . Marital status: Married    Spouse name: Not on file  . Number of children: 2  . Years of education: 51  . Highest education level: Not on file  Occupational History    Comment: retired  Scientific laboratory technician  . Financial resource strain: Not on file  . Food insecurity:    Worry: Not on file    Inability: Not on file  . Transportation needs:    Medical: Not on file    Non-medical: Not on file  Tobacco Use  . Smoking status: Former Smoker    Types: Cigarettes    Last attempt to quit: 07/09/2004    Years since quitting: 13.2  . Smokeless tobacco: Never Used  Substance and Sexual Activity  . Alcohol use:  Yes    Alcohol/week: 1.8 oz    Types: 3 Glasses of wine per week  . Drug use: No  . Sexual activity: Yes    Partners: Female  Lifestyle  . Physical activity:    Days per week: Not on file    Minutes per session: Not on file  . Stress: Not on file  Relationships  . Social connections:    Talks on phone: Not on file    Gets together: Not on file    Attends religious service: Not on file    Active member of club or organization: Not on file    Attends meetings of clubs or organizations: Not on file    Relationship status: Not on file  . Intimate partner violence:    Fear of  current or ex partner: Not on file    Emotionally abused: Not on file    Physically abused: Not on file    Forced sexual activity: Not on file  Other Topics Concern  . Not on file  Social History Narrative   HSG 2 years trade school. married 1965. 2 daughters- '66, '71. 2 grandchldren. 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     Current Outpatient Medications:  .  acarbose (PRECOSE) 50 MG tablet, TAKE 1 TABLET(50 MG) BY MOUTH THREE TIMES DAILY WITH MEALS, Disp: 90 tablet, Rfl: 2 .  aspirin EC 81 MG tablet, Take 1 tablet (81 mg total) by mouth daily., Disp: 150 tablet, Rfl: 2 .  glucose blood (FREESTYLE TEST STRIPS) test strip, Use to check blood sugars twice a day Dx CODE: E11.9, Disp: 150 each, Rfl: 5 .  Lancets (FREESTYLE) lancets, Use to check blood sugars 1-4 times daily. Dx Code: E11.9, Disp: 100 each, Rfl: 12 .  losartan (COZAAR) 50 MG tablet, Take 1 tablet by mouth daily., Disp: 90 tablet, Rfl: 2 .  sildenafil (VIAGRA) 100 MG tablet, Take 0.5-1 tablets (50-100 mg total) by mouth daily as needed for erectile dysfunction., Disp: 2 tablet, Rfl: 0 .  simvastatin (ZOCOR) 20 MG tablet, Take 1 tablet (20 mg total) by mouth at bedtime., Disp: 90 tablet, Rfl: 1 .  sitaGLIPtin (JANUVIA) 100 MG tablet, Take 1 tablet (100 mg total) by mouth daily., Disp: 10 tablet, Rfl: 0 .  vitamin B-12 (CYANOCOBALAMIN) 1000 MCG tablet, Take 1,000 mcg by mouth daily., Disp: , Rfl:   Current Facility-Administered Medications:  .  lidocaine (PF) (XYLOCAINE) 1 % injection 0.3 mL, 0.3 mL, Other, Once, Magnus Sinning, MD  Allergies  Allergen Reactions  . Metformin And Related Other (See Comments)    GI upset, diarrhea, wt loss     ROS See HPI  Objective  Vitals:   09/26/17 0800  BP: 126/74  Pulse: 66  Resp: 16  Temp: 97.7 F (36.5 C)  TempSrc: Oral  SpO2: 97%  Weight: 164 lb (74.4 kg)  Height: 5\' 6"  (1.676 m)   Body mass index is 26.47 kg/m.  Physical Exam Vital  signs reviewed. Constitutional: Patient appears well-developed and well-nourished. No distress.  HENT: Head: Normocephalic and atraumatic.Nose: Nose normal. Mouth/Throat: Oropharynx is clear and moist. No oropharyngeal exudate.  Eyes: Conjunctivae and EOM are normal. Pupils are equal, round, and reactive to light. No scleral icterus.  Neck: Normal range of motion. Neck supple. Cardiovascular: Normal rate, regular rhythm and normal heart sounds. No BLE edema. Distal pulses intact. Pulmonary/Chest: Effort normal and breath sounds normal. No respiratory distress. Musculoskeletal: Normal range of motion, no joint effusions. No  gross deformities. Right foot appears normal without erythema, swelling, tenderness or deformity. Neurological: He is alert and oriented to person, place, and time. Coordination, balance, strength, speech and gait are normal.  Skin: Skin is warm and dry. No rash noted. No erythema.  Psychiatric: Patient has a normal mood and affect. behavior is normal. Judgment and thought content normal.  Assessment & Plan F/U TBD pending lab results  -Reviewed Health Maintenance: up to date   Right foot pain Will check uric acid for gout flare today- if this is normal consider referral to sports med for further evaluation - Uric acid; Future

## 2017-10-03 ENCOUNTER — Telehealth: Payer: Self-pay | Admitting: Nurse Practitioner

## 2017-10-03 MED ORDER — SITAGLIPTIN PHOSPHATE 100 MG PO TABS: 100.0000 mg | ORAL_TABLET | Freq: Every day | ORAL | 0 refills | Status: DC

## 2017-10-03 NOTE — Telephone Encounter (Signed)
Error on the note below for Januvia samples.  Please disregard.

## 2017-10-03 NOTE — Telephone Encounter (Signed)
Copied from [place] [id number]. Topic: Quick Communication - Rx Refill/Question >> Oct 03, 2017  3:36 PM Boyd Kerbs wrote: Medication:   sitaGLIPtin (JANUVIA) 100 MG tablet  Has the patient contacted their pharmacy? No. [facility] Is sending him paperwork that has to be filled out again for refills. He only has 2 pills left and will need some until gets paper work for co. He just found out about this.   (Agent: If no, request that the patient contact the pharmacy for the refill.)  Preferred Pharmacy (with phone number or street name):   [facility] [address].    Agent: Please be advised that RX refills may take up to 3 business days. We ask that you follow-up with your pharmacy.

## 2017-10-03 NOTE — Telephone Encounter (Signed)
This patient is requesting samples of Sitagliptin (Januvia) 100 MG. See patient's note. Routed to [facility] pool for consideration.  PCP Caesar Chestnut, NP

## 2017-10-04 ENCOUNTER — Other Ambulatory Visit: Payer: Self-pay | Admitting: Family

## 2017-10-06 ENCOUNTER — Telehealth: Payer: Self-pay | Admitting: Nurse Practitioner

## 2017-10-06 MED ORDER — SITAGLIPTIN PHOSPHATE 100 MG PO TABS: 100.0000 mg | ORAL_TABLET | Freq: Every day | ORAL | 0 refills | Status: DC

## 2017-10-06 NOTE — Addendum Note (Signed)
Addended by: Delice Bison E on: 10/06/2017 01:19 PM   Modules accepted: Orders

## 2017-10-06 NOTE — Telephone Encounter (Signed)
Copied from [place] [id number]. Topic: Quick Communication - Rx Refill/Question >> Oct 03, 2017  3:36 PM Boyd Kerbs wrote: Medication:   sitaGLIPtin (JANUVIA) 100 MG tablet  Has the patient contacted their pharmacy? No. [facility] Is sending him paperwork that has to be filled out again for refills. He only has 2 pills left and will need some until gets paper work for [employer].  (Agent: If no, request that the patient contact the pharmacy for the refill.)  Preferred Pharmacy (with phone number or street name):   [facility] [address].    Agent: Please be advised that RX refills may take up to 3 business days. We ask that you follow-up with your pharmacy. >> Oct 06, 2017  5:32 PM Neva Seat wrote: Pt is calling back stating [facility] says office hasn't called in refill.   Please call pt at [phone]

## 2017-10-06 NOTE — Telephone Encounter (Signed)
Pts refill sent to pharmacy. Pt is aware.

## 2017-10-06 NOTE — Telephone Encounter (Signed)
Pharmacy did not received refill request, please advise. Call back to patient at [phone]

## 2017-10-06 NOTE — Telephone Encounter (Signed)
Patient is calling back to check on the status of this. He states he is completely out. Contact pt once medication is sent in. Please advise.

## 2017-10-06 NOTE — Addendum Note (Signed)
Addended by: Delice Bison E on: 10/06/2017 10:48 AM   Modules accepted: Orders

## 2017-10-07 MED ORDER — SITAGLIPTIN PHOSPHATE 100 MG PO TABS: 100.0000 mg | ORAL_TABLET | Freq: Every day | ORAL | 0 refills | Status: DC

## 2017-10-07 NOTE — Telephone Encounter (Signed)
PA done for rx. Sample left up front for pt to pick up of januvia. Pt is aware.

## 2017-10-07 NOTE — Telephone Encounter (Signed)
[facility]../lmb 

## 2017-10-07 NOTE — Telephone Encounter (Signed)
PA has been done for Januvia 100mg . Per pharmacy the PA is needed. KEY: [id number]

## 2017-10-08 ENCOUNTER — Encounter (HOSPITAL_COMMUNITY): Payer: Self-pay | Admitting: Emergency Medicine

## 2017-10-08 ENCOUNTER — Emergency Department (HOSPITAL_COMMUNITY)
Admission: EM | Admit: 2017-10-08 | Discharge: 2017-10-08 | Disposition: A | Discharge status: Home / Self Care | Payer: Medicare Other | Attending: Emergency Medicine | Admitting: Emergency Medicine

## 2017-10-08 ENCOUNTER — Emergency Department (HOSPITAL_COMMUNITY): Payer: Medicare Other

## 2017-10-08 DIAGNOSIS — Y999 Unspecified external cause status: Secondary | ICD-10-CM | POA: Diagnosis not present

## 2017-10-08 DIAGNOSIS — Z7982 Long term (current) use of aspirin: Secondary | ICD-10-CM | POA: Diagnosis not present

## 2017-10-08 DIAGNOSIS — Y92009 Unspecified place in unspecified non-institutional (private) residence as the place of occurrence of the external cause: Secondary | ICD-10-CM | POA: Diagnosis not present

## 2017-10-08 DIAGNOSIS — Z7984 Long term (current) use of oral hypoglycemic drugs: Secondary | ICD-10-CM | POA: Diagnosis not present

## 2017-10-08 DIAGNOSIS — S52125A Nondisplaced fracture of head of left radius, initial encounter for closed fracture: Secondary | ICD-10-CM | POA: Diagnosis not present

## 2017-10-08 DIAGNOSIS — E119 Type 2 diabetes mellitus without complications: Secondary | ICD-10-CM | POA: Diagnosis not present

## 2017-10-08 DIAGNOSIS — Z79899 Other long term (current) drug therapy: Secondary | ICD-10-CM | POA: Diagnosis not present

## 2017-10-08 DIAGNOSIS — S52132A Displaced fracture of neck of left radius, initial encounter for closed fracture: Secondary | ICD-10-CM | POA: Diagnosis not present

## 2017-10-08 DIAGNOSIS — X58XXXA Exposure to other specified factors, initial encounter: Secondary | ICD-10-CM | POA: Insufficient documentation

## 2017-10-08 DIAGNOSIS — Y9384 Activity, sleeping: Secondary | ICD-10-CM | POA: Diagnosis not present

## 2017-10-08 DIAGNOSIS — I10 Essential (primary) hypertension: Secondary | ICD-10-CM | POA: Insufficient documentation

## 2017-10-08 DIAGNOSIS — M25521 Pain in right elbow: Secondary | ICD-10-CM | POA: Insufficient documentation

## 2017-10-08 DIAGNOSIS — S59902A Unspecified injury of left elbow, initial encounter: Secondary | ICD-10-CM | POA: Diagnosis present

## 2017-10-08 DIAGNOSIS — Z87891 Personal history of nicotine dependence: Secondary | ICD-10-CM | POA: Insufficient documentation

## 2017-10-08 LAB — CBC WITH DIFFERENTIAL/PLATELET
BASOS ABS: 0 10*3/uL (ref 0.0–0.1)
Basophils Relative: 0 %
EOS PCT: 1 %
Eosinophils Absolute: 0.1 10*3/uL (ref 0.0–0.7)
HEMATOCRIT: 42.6 % (ref 39.0–52.0)
Hemoglobin: 13.7 g/dL (ref 13.0–17.0)
LYMPHS ABS: 1.5 10*3/uL (ref 0.7–4.0)
LYMPHS PCT: 21 %
MCH: 25.8 pg — AB (ref 26.0–34.0)
MCHC: 32.2 g/dL (ref 30.0–36.0)
MCV: 80.4 fL (ref 78.0–100.0)
Monocytes Absolute: 0.7 10*3/uL (ref 0.1–1.0)
Monocytes Relative: 9 %
NEUTROS ABS: 5.2 10*3/uL (ref 1.7–7.7)
Neutrophils Relative %: 69 %
PLATELETS: 142 10*3/uL — AB (ref 150–400)
RBC: 5.3 MIL/uL (ref 4.22–5.81)
RDW: 15 % (ref 11.5–15.5)
WBC: 7.5 10*3/uL (ref 4.0–10.5)

## 2017-10-08 LAB — BASIC METABOLIC PANEL
ANION GAP: 11 (ref 5–15)
BUN: 17 mg/dL (ref 6–20)
CO2: 23 mmol/L (ref 22–32)
Calcium: 9.7 mg/dL (ref 8.9–10.3)
Chloride: 103 mmol/L (ref 101–111)
Creatinine, Ser: 0.78 mg/dL (ref 0.61–1.24)
GFR calc Af Amer: >60 mL/min (ref >60)
GLUCOSE: 225 mg/dL — AB (ref 65–99)
Potassium: 4.1 mmol/L (ref 3.5–5.1)
Sodium: 137 mmol/L (ref 135–145)

## 2017-10-08 MED ORDER — TRAMADOL HCL 50 MG PO TABS: 50.0000 mg | ORAL_TABLET | Freq: Four times a day (QID) | ORAL | 0 refills | Status: DC | PRN

## 2017-10-08 MED ORDER — OXYCODONE-ACETAMINOPHEN 5-325 MG PO TABS
2.0000 | ORAL_TABLET | Freq: Once | ORAL | Status: DC
  Filled 2017-10-08: qty 2

## 2017-10-08 MED ORDER — ONDANSETRON 4 MG PO TBDP
4.0000 mg | ORAL_TABLET | Freq: Once | ORAL | Status: DC
  Filled 2017-10-08: qty 1

## 2017-10-08 MED ORDER — ONDANSETRON HCL 4 MG/2ML IJ SOLN
4.0000 mg | Freq: Once | INTRAMUSCULAR | Status: AC
  Administered 2017-10-08: 4 mg via INTRAVENOUS
  Filled 2017-10-08: qty 2

## 2017-10-08 MED ORDER — FENTANYL CITRATE (PF) 100 MCG/2ML IJ SOLN
50.0000 ug | Freq: Once | INTRAMUSCULAR | Status: AC
  Administered 2017-10-08: 50 ug via INTRAVENOUS
  Filled 2017-10-08: qty 2

## 2017-10-08 NOTE — ED Notes (Signed)
Patient states the pain in his left arm began at about 11p last night. It began in his elbow, then he went to bed and woke up with his whole left arm hurting including his hand.

## 2017-10-08 NOTE — ED Notes (Signed)
Patient transported to X-ray 

## 2017-10-08 NOTE — ED Triage Notes (Signed)
Patient in with complaints of left arm pain, from shoulder down into elbow, to wrist. Also reports pain is starting in the right elbow currently. Denies injury.

## 2017-10-08 NOTE — ED Notes (Signed)
ED Provider at bedside. 

## 2017-10-08 NOTE — ED Provider Notes (Signed)
[facility] DEPT Provider Note   CSN: [id number] Arrival date & time: 10/08/17  0515     History   Chief Complaint Chief Complaint  Patient presents with  . Arm Pain    HPI Alex Massey is a 77 y.o. male c/o left arm pain, worst in the elbow. Began last night at 11 pm. Throbbing, with tingling in his 3rd and 4th fingers. He has decreased strength in the deltoid, now having pain in the R elbow.  Pain is 9/10. Woke him from sleep at 1:00 am and in and out of sleep until 3:00AM when he called EMS. No cp, sob, diaphoresis, nausea, vomiting, fever or chills. Denies injury. HPI  Past Medical History:  Diagnosis Date  . DIABETES MELLITUS, TYPE II   . ERECTILE DYSFUNCTION   . FOOT DROP, RIGHT    s/p ESI x 3 at [place] in 2009 and 2016  . HYPERLIPIDEMIA   . NEPHROLITHIASIS, HX OF     Patient Active Problem List   Diagnosis Date Noted  . Lumbar radiculopathy 07/09/2016  . Radiculopathy due to lumbar intervertebral disc disorder 07/09/2016  . Medicare annual wellness visit, subsequent 02/05/2016  . Hemorrhoid 08/07/2015  . BPH without obstruction/lower urinary tract symptoms 02/01/2015  . Essential hypertension 09/12/2014  . Routine general medical examination at a health care facility 01/14/2011  . ERECTILE DYSFUNCTION 04/04/2008  . Hyperlipidemia 06/26/2007  . Diabetes ([place]) 06/25/2007  . NEPHROLITHIASIS, HX OF 06/25/2007    Past Surgical History:  Procedure Laterality Date  . L-spine surgery  [id number]  . LUMBAR EPIDURAL INJECTION  2009, 2006   dr newton        Home Medications    Prior to Admission medications   Medication Sig Start Date End Date Taking? Authorizing Provider  aspirin EC 81 MG tablet Take 1 tablet (81 mg total) by mouth daily. 02/01/15  Yes Rowe Clack, MD  losartan (COZAAR) 50 MG tablet Take 1 tablet by mouth daily. 03/13/17  Yes Golden Circle, FNP  simvastatin (ZOCOR) 20 MG tablet Take 1 tablet  (20 mg total) by mouth at bedtime. 08/29/17  Yes Shambley, Delphia Grates, NP  sitaGLIPtin (JANUVIA) 100 MG tablet Take 1 tablet (100 mg total) by mouth daily. 10/07/17  Yes Lance Sell, NP  vitamin B-12 (CYANOCOBALAMIN) 1000 MCG tablet Take 1,000 mcg by mouth daily.   Yes [provider]  acarbose (PRECOSE) 50 MG tablet TAKE 1 TABLET(50 MG) BY MOUTH THREE TIMES DAILY WITH MEALS Patient not taking: Reported on 10/08/2017 06/13/17   Lance Sell, NP  glucose blood (FREESTYLE TEST STRIPS) test strip Use to check blood sugars twice a day Dx CODE: E11.9 05/22/16   Golden Circle, FNP  Lancets (FREESTYLE) lancets Use to check blood sugars 1-4 times daily. Dx Code: E11.9 08/28/16   Golden Circle, FNP  sildenafil (VIAGRA) 100 MG tablet Take 0.5-1 tablets (50-100 mg total) by mouth daily as needed for erectile dysfunction. Patient not taking: Reported on 10/08/2017 02/01/15   Rowe Clack, MD    Family History Family History  Problem Relation Age of Onset  . Healthy Mother   . Diabetes Father   . Colon cancer Brother 82    Social History Social History   Tobacco Use  . Smoking status: Former Smoker    Types: Cigarettes    Last attempt to quit: 07/09/2004    Years since quitting: 13.2  . Smokeless tobacco: Never Used  Substance  Use Topics  . Alcohol use: Yes    Alcohol/week: 1.8 oz    Types: 3 Glasses of wine per week  . Drug use: No     Allergies   Metformin and related   Review of Systems Review of Systems Ten systems reviewed and are negative for acute change, except as noted in the HPI.    Physical Exam Updated Vital Signs BP (!) 161/92 (BP Location: Right Arm)   Pulse 77   Temp 98.5 F (36.9 C) (Oral)   Resp 18   SpO2 99%   Physical Exam  Constitutional: He appears well-developed and well-nourished. No distress.  HENT:  Head: Normocephalic and atraumatic.  Eyes: Conjunctivae are normal. No scleral icterus.  Neck: Normal range of motion.  Neck supple.  Cardiovascular: Normal rate, regular rhythm and normal heart sounds.  Pulmonary/Chest: Effort normal and breath sounds normal. No respiratory distress.  Abdominal: Soft. There is no tenderness.  Musculoskeletal: He exhibits no edema.  Patient with point tenderness over the elbow.  Pain with flexion extension and worse with supination and pronation.  Normal wrist.  Decreased grip strength in the left.  Normal abduction of the deltoid on the left.  normal radial pulse  Neurological: He is alert.  Skin: Skin is warm and dry. He is not diaphoretic.  Psychiatric: His behavior is normal.  Nursing note and vitals reviewed.    ED Treatments / Results  Labs (all labs ordered are listed, but only abnormal results are displayed) Labs Reviewed - No data to display  EKG None  Radiology No results found.  Procedures Procedures (including critical care time)  Medications Ordered in ED Medications - No data to display   Initial Impression / Assessment and Plan / ED Course  I have reviewed the triage vital signs and the nursing notes.  Pertinent labs & imaging results that were available during my care of the patient were reviewed by me and considered in my medical decision making (see chart for details).  Clinical Course as of Oct 09 903  Wed Oct 08, 2017  0904 Patient with closed impaction fracture of the left radial head with hemarthrosis. Unknown mechanism.  DG Elbow Complete Left [AH]    Clinical Course User Index [AH] Margarita Mail, PA-C     Patient with left radial head fracture. Mechanism of injury unknown. NVI.  patien laced in sling immobilizer.  Patient will be followed with orthopedics.  Discussed home care and somatic treatment.  Return precautions.  He appears appropriate for discharge at this time  Final Clinical Impressions(s) / ED Diagnoses   Final diagnoses:  Closed nondisplaced fracture of head of left radius, initial encounter    ED Discharge  Orders    None       Margarita Mail, PA-C 10/08/17 1622    Virgel Manifold, MD 10/09/17 [phone]

## 2017-10-08 NOTE — Discharge Instructions (Addendum)
Contact a health care provider if: You have pain or swelling that gets worse. Get help right away if: You have severe pain when you stretch your fingers. You have fluid or a bad smell coming from your splint. Your hand or fingers get cold or turn pale or blue. You lose feeling in any part of your hand or arm.

## 2017-10-10 ENCOUNTER — Telehealth (INDEPENDENT_AMBULATORY_CARE_PROVIDER_SITE_OTHER): Payer: Self-pay | Admitting: Orthopaedic Surgery

## 2017-10-10 ENCOUNTER — Other Ambulatory Visit: Payer: Self-pay | Admitting: Nurse Practitioner

## 2017-10-10 MED ORDER — ACARBOSE 50 MG PO TABS: ORAL_TABLET | ORAL | 2 refills | Status: DC

## 2017-10-10 MED ORDER — TRAMADOL HCL 50 MG PO TABS: 50.0000 mg | ORAL_TABLET | Freq: Three times a day (TID) | ORAL | 0 refills | Status: DC | PRN

## 2017-10-10 NOTE — Telephone Encounter (Signed)
Tried calling pt twice line keep being busy. Will call back later.Marland KitchenChryl Heck

## 2017-10-10 NOTE — Telephone Encounter (Signed)
Can we clarify that patient has been taking Januvia once per day and Precose 3 x per day? Hollie Beach was happy with his last hgba1c but I want to make sure I am reading her note correctly.

## 2017-10-10 NOTE — Telephone Encounter (Signed)
Copied from [place] [id number]. Topic: Quick Communication - Rx Refill/Question >> Oct 10, 2017 10:03 AM Lennox Solders wrote: Medication:acarbose 50 mg #270 for 90 day supply. Pt takes medication 3 times a day. [address]  Pt has called pharm

## 2017-10-10 NOTE — Telephone Encounter (Signed)
[facility] on [address]   Tramadol 50 mg

## 2017-10-10 NOTE — Telephone Encounter (Signed)
Ok to rf? 

## 2017-10-10 NOTE — Telephone Encounter (Signed)
Ok to refill 

## 2017-10-10 NOTE — Telephone Encounter (Signed)
Called to pharmacy. Advised patient done.  

## 2017-10-10 NOTE — Telephone Encounter (Signed)
Called pt verified if he is taking the Januvia once a day and Precose three times a day, and pt states that is current.Marland KitchenJohny Massey

## 2017-10-10 NOTE — Telephone Encounter (Signed)
Acarbose refill request  LOV 09/26/17 with [facility]  There is a note under this medication saying pt not taking and it was discontinued by provider on 10/08/17.   [place], [place].

## 2017-10-13 ENCOUNTER — Telehealth: Payer: Self-pay | Admitting: Nurse Practitioner

## 2017-10-13 NOTE — Telephone Encounter (Signed)
Pt's Spouse, Gaspar Skeeters, dropped off [employer] Patient Assistance Program Enrollment Form for [place] for completion.  Please send off once completed per Mrs. Whack.  Form placed in [name]'s box for pick up & completion.

## 2017-10-15 ENCOUNTER — Ambulatory Visit (INDEPENDENT_AMBULATORY_CARE_PROVIDER_SITE_OTHER): Payer: Medicare Other | Admitting: Physician Assistant

## 2017-10-15 ENCOUNTER — Encounter (INDEPENDENT_AMBULATORY_CARE_PROVIDER_SITE_OTHER): Payer: Self-pay | Admitting: Orthopaedic Surgery

## 2017-10-15 DIAGNOSIS — M25522 Pain in left elbow: Secondary | ICD-10-CM | POA: Diagnosis not present

## 2017-10-15 NOTE — Progress Notes (Signed)
Office Visit Note   Patient: Alex Massey           Date of Birth: 06-15-1941           MRN: [id number] Visit Date: 10/15/2017              Requested by: Lance Sell, NP [place], [place] [address] PCP: Lance Sell, NP   Assessment & Plan: Visit Diagnoses:  1. Pain in left elbow     Plan: He will continue the sling when up ambulating.  He no lifting greater than 10 pounds with the left arm.  He can come out of the sling for gentle range of motion of the elbow.  We will see him back in 2 weeks obtain 2 views of the left elbow at that time.  Questions encouraged and answered at length.  Follow-Up Instructions: Return in about 2 weeks (around 10/29/2017).   Orders:  No orders of the defined types were placed in this encounter.  No orders of the defined types were placed in this encounter.     Procedures: No procedures performed   Clinical Data: No additional findings.   Subjective: Chief Complaint  Patient presents with  . 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  States that his pain is started last week he went to the ER on 10/08/2017.  He states he has had no injury.  He states he may have bumped his elbow on a chair a few times.  He denies any recent falls.  Radiographs obtained 10/08/2017 showed some mild to moderate arthritic changes of the elbow particularly on the lateral view.  There appears to be in impaction fracture of the proximal radial metaphysis.  Radius is in excellent position alignment.  Slight joint effusion.  No other acute fractures.  Review of Systems Denies dizziness, falls.  Otherwise review of systems please see HPI  Objective: Vital Signs: There were no vitals taken for this visit.  Physical Exam  Constitutional: He is oriented to person, place, and time. He appears well-developed and well-nourished. No distress.  Pulmonary/Chest: Effort  normal.  Neurological: He is alert and oriented to person, place, and time.  Skin: He is not diaphoretic.  Psychiatric: He has a normal mood and affect.    Ortho Exam Left elbow he has good range of motion the elbow with minimal discomfort.  Tenderness over the radial head.  Distal biceps is intact.  He has full supination pronation of the left forearm without significant pain.  Radial pulse bilaterally is intact.  Sensation grossly intact bilateral hands full motor bilateral hands.  No obvious deformity of the left elbow.  Remainder of the left elbow is nontender. Specialty Comments:  No specialty comments available.  Imaging: No results found.   PMFS History: Patient Active Problem List   Diagnosis Date Noted  . Lumbar radiculopathy 07/09/2016  . Radiculopathy due to lumbar intervertebral disc disorder 07/09/2016  . Medicare annual wellness visit, subsequent 02/05/2016  . Hemorrhoid 08/07/2015  . BPH without obstruction/lower urinary tract symptoms 02/01/2015  . Essential hypertension 09/12/2014  . Routine general medical examination at a health care facility 01/14/2011  . ERECTILE DYSFUNCTION 04/04/2008  . Hyperlipidemia 06/26/2007  . Diabetes ([place]) 06/25/2007  . NEPHROLITHIASIS, HX OF 06/25/2007   Past Medical History:  Diagnosis Date  . DIABETES MELLITUS, TYPE II   . ERECTILE DYSFUNCTION   .  FOOT DROP, RIGHT    s/p ESI x 3 at [place] in 2009 and 2016  . HYPERLIPIDEMIA   . NEPHROLITHIASIS, HX OF     Family History  Problem Relation Age of Onset  . Healthy Mother   . Diabetes Father   . Colon cancer Brother 101    Past Surgical History:  Procedure Laterality Date  . L-spine surgery  [id number]  . LUMBAR EPIDURAL INJECTION  2009, 2006   dr newton   Social History   Occupational History    Comment: retired  Tobacco Use  . Smoking status: Former Smoker    Types: Cigarettes    Last attempt to quit: 07/09/2004    Years since quitting: 13.2  . Smokeless  tobacco: Never Used  Substance and Sexual Activity  . Alcohol use: Yes    Alcohol/week: 1.8 oz    Types: 3 Glasses of wine per week  . Drug use: No  . Sexual activity: Yes    Partners: Female

## 2017-10-20 NOTE — Telephone Encounter (Signed)
Forms completed and mailed back to company.

## 2017-10-29 ENCOUNTER — Ambulatory Visit (INDEPENDENT_AMBULATORY_CARE_PROVIDER_SITE_OTHER): Payer: Medicare Other | Admitting: Orthopaedic Surgery

## 2017-10-29 ENCOUNTER — Ambulatory Visit (INDEPENDENT_AMBULATORY_CARE_PROVIDER_SITE_OTHER): Payer: Medicare Other

## 2017-10-29 ENCOUNTER — Encounter (INDEPENDENT_AMBULATORY_CARE_PROVIDER_SITE_OTHER): Payer: Self-pay | Admitting: Orthopaedic Surgery

## 2017-10-29 DIAGNOSIS — M25522 Pain in left elbow: Secondary | ICD-10-CM

## 2017-10-29 NOTE — Progress Notes (Signed)
Office Visit Note   Patient: Alex Massey           Date of Birth: 04/24/1941           MRN: [id number] Visit Date: 10/29/2017              Requested by: Lance Sell, NP [place], [place] [phone] PCP: Lance Sell, NP   Assessment & Plan: Visit Diagnoses:  1. Left elbow pain     Plan: Alex Massey work on gentle range of motion of the elbow and is activities as tolerated.  If he does not regain full range of motion call the office and we will set him up for physical therapy.  Has continued to take occasional Aleve as many as 2 in the morning 2 in the evening with food on bad days.  He will follow-up on an as-needed basis.  Follow-Up Instructions: Return if symptoms worsen or fail to improve.   Orders:  Orders Placed This Encounter  Procedures  . XR Elbow 2 Views Left   No orders of the defined types were placed in this encounter.     Procedures: No procedures performed   Clinical Data: No additional findings.   Subjective: Chief Complaint  Patient presents with  . Left Elbow - Follow-up    HPI  Alex Massey  returns today for his follow-up of his left elbow.  He states yesterday the arm was doing well and he had full range of motion.  Today he states it is stiff and sore.  He has been using the sling some.  He mostly uses a sling when ambulating.  He has been doing some exercises on his own so for days after he does the exercises.  Review of Systems See HPI otherwise negative  Objective: Vital Signs: There were no vitals taken for this visit.  Physical Exam  Constitutional: He appears well-developed and well-nourished. No distress.  Pulmonary/Chest: Effort normal.  Skin: He is not diaphoretic.  Psychiatric: He has a normal mood and affect.    Ortho Exam Left elbow has minimal tenderness along the joint line.  No tenderness over the radial head.  There is full supination pronation of the arm without  pain.  Is lacking last 10 degrees and full extension of the elbow today.  No rashes skin lesions ulcerations erythema about the left elbow. Specialty Comments:  No specialty comments available.  Imaging: Xr Elbow 2 Views Left  Result Date: 10/29/2017  AP lateral views left elbow: Arthritic changes elbow unchanged.  There is no callus formation particularly about the radial head and to the acute fracture.  Elbow is well located.    PMFS History: Patient Active Problem List   Diagnosis Date Noted  . Lumbar radiculopathy 07/09/2016  . Radiculopathy due to lumbar intervertebral disc disorder 07/09/2016  . Medicare annual wellness visit, subsequent 02/05/2016  . Hemorrhoid 08/07/2015  . BPH without obstruction/lower urinary tract symptoms 02/01/2015  . Essential hypertension 09/12/2014  . Routine general medical examination at a health care facility 01/14/2011  . ERECTILE DYSFUNCTION 04/04/2008  . Hyperlipidemia 06/26/2007  . Diabetes ([place]) 06/25/2007  . NEPHROLITHIASIS, HX OF 06/25/2007   Past Medical History:  Diagnosis Date  . DIABETES MELLITUS, TYPE II   . ERECTILE DYSFUNCTION   . FOOT DROP, RIGHT    s/p ESI x 3 at [facility] in 2009 and 2016  . HYPERLIPIDEMIA   . NEPHROLITHIASIS, HX OF  Family History  Problem Relation Age of Onset  . Healthy Mother   . Diabetes Father   . Colon cancer Brother 30    Past Surgical History:  Procedure Laterality Date  . L-spine surgery  [id number]  . LUMBAR EPIDURAL INJECTION  2009, 2006   dr newton   Social History   Occupational History    Comment: retired  Tobacco Use  . Smoking status: Former Smoker    Types: Cigarettes    Last attempt to quit: 07/09/2004    Years since quitting: 13.3  . Smokeless tobacco: Never Used  Substance and Sexual Activity  . Alcohol use: Yes    Alcohol/week: 1.8 oz    Types: 3 Glasses of wine per week  . Drug use: No  . Sexual activity: Yes    Partners: Female

## 2017-11-07 ENCOUNTER — Telehealth: Payer: Self-pay | Admitting: Nurse Practitioner

## 2017-11-07 NOTE — Telephone Encounter (Signed)
Request for refill of Lancets (Freestyle), previously filled by another provider.    LOV:09/26/17 [name] Shambley,NP  [facility]

## 2017-11-07 NOTE — Telephone Encounter (Signed)
Copied from [place] [phone]. Topic: Quick Communication - Rx Refill/Question >> Nov 07, 2017 12:48 PM Carolyn Stare wrote: Medication    Lancets (FREESTYLE) lancets  Has the patient contacted their pharmacy yes     Preferred Pharmacy  [facility]   Agent: Please be advised that RX refills may take up to 3 business days. We ask that you follow-up with your pharmacy.

## 2017-11-10 MED ORDER — FREESTYLE LANCETS MISC: 3 refills | Status: DC

## 2017-11-10 NOTE — Telephone Encounter (Signed)
Reviewed chart pt is up-to-date sent refills to [facility].../lmb  

## 2017-11-12 DIAGNOSIS — E119 Type 2 diabetes mellitus without complications: Secondary | ICD-10-CM | POA: Diagnosis not present

## 2017-11-22 ENCOUNTER — Telehealth: Payer: Self-pay | Admitting: Emergency Medicine

## 2017-11-22 NOTE — Telephone Encounter (Signed)
Patient spouse called [facility] about refilling medication (Glimepiride 2 mg)  This medication is not on current medication list. [facility] nurse refilled medication for the weekend #3 given.   Please advise

## 2017-11-23 NOTE — Telephone Encounter (Signed)
Alex Massey can we please have him schedule OV with all of his medications so we can do a med rec. He did not report glimepride to me at last OV when we discussed his diabetes so i'd like for him to come in to go through meds together

## 2017-11-24 NOTE — Telephone Encounter (Signed)
Patient has appointment scheduled for tomorrow.

## 2017-11-25 ENCOUNTER — Ambulatory Visit: Payer: Medicare Other | Admitting: Nurse Practitioner

## 2017-11-25 ENCOUNTER — Encounter: Payer: Self-pay | Admitting: Nurse Practitioner

## 2017-11-25 VITALS — BP 134/82 | HR 67 | Temp 97.9°F | Resp 16 | Ht 66.0 in | Wt 164.0 lb

## 2017-11-25 DIAGNOSIS — E118 Type 2 diabetes mellitus with unspecified complications: Secondary | ICD-10-CM

## 2017-11-25 DIAGNOSIS — M25522 Pain in left elbow: Secondary | ICD-10-CM

## 2017-11-25 MED ORDER — GLIMEPIRIDE 2 MG PO TABS: 2.0000 mg | ORAL_TABLET | Freq: Every day | ORAL | 2 refills | Status: DC

## 2017-11-25 NOTE — Progress Notes (Signed)
Name: Alex Massey   MRN: [id number]    DOB: 1940/08/14   Date:11/25/2017       Progress Note  Subjective  Chief Complaint  Chief Complaint  Patient presents with  . Follow-up    diabetes    HPI Mr Gergen Is here today for a diabetes medication follow up. He also wants to discuss a recent diagnosis of arthritis.  Diabetes- maintained on acarbose 50 TID, and per his report at his last OV on 3/22, he is also on januvia 100 daily. However, per medication review it appeared his [place] Rx was outdated, with last refill over 1 year ago. Last week we received a refill request for glipizide which was not on his medication list at all, so he was asked to schedule OV and bring all medication bottles in for review to update diabetes management plan. He is here with a written list of his daily diabetes medications, he says his wife wrote this list directly from his medication bottles just prior to his OV today , and he is entirely certain that he does take these medications all daily. He says he has not noted any adverse effects of his medications. List is as follows: Glimepiride 2mg  once daily januvia 100 mg once daily Acarbose 50mg  three times daily with meals He says that he has been taking [place] 100mg  daily and glimepiride 2mg  once daily for some time now along with acarbose 50 TID with meals. Reports he checks his blood sugars three times weekly first thing in the morning with recent readings around 180. Denies tremor, diaphoresis, polyuria, polydipsia, polyphagia.  Lab Results  Component Value Date   HGBA1C 7.6 (H) 09/26/2017   Arthritis- He was recently diagnosed by orthopedic provider, Dr Ninfa Linden, with arthritis in his left elbow The pain has improved some but he would like to discuss home management of arthritis pain Denies weakness, numbness, swelling, erythema.   Patient Active Problem List   Diagnosis Date Noted  . Lumbar radiculopathy 07/09/2016  . Radiculopathy due  to lumbar intervertebral disc disorder 07/09/2016  . Medicare annual wellness visit, subsequent 02/05/2016  . Hemorrhoid 08/07/2015  . BPH without obstruction/lower urinary tract symptoms 02/01/2015  . Essential hypertension 09/12/2014  . Routine general medical examination at a health care facility 01/14/2011  . ERECTILE DYSFUNCTION 04/04/2008  . Hyperlipidemia 06/26/2007  . Diabetes ([place]) 06/25/2007  . NEPHROLITHIASIS, HX OF 06/25/2007    Past Surgical History:  Procedure Laterality Date  . L-spine surgery  [id number]  . LUMBAR EPIDURAL INJECTION  2009, 2006   dr newton    Family History  Problem Relation Age of Onset  . Healthy Mother   . Diabetes Father   . Colon cancer Brother 60    Social History   Socioeconomic History  . Marital status: Married    Spouse name: Not on file  . Number of children: 2  . Years of education: 71  . Highest education level: Not on file  Occupational History    Comment: retired  Scientific laboratory technician  . Financial resource strain: Not on file  . Food insecurity:    Worry: Not on file    Inability: Not on file  . Transportation needs:    Medical: Not on file    Non-medical: Not on file  Tobacco Use  . Smoking status: Former Smoker    Types: Cigarettes    Last attempt to quit: 07/09/2004    Years since quitting: 13.3  . Smokeless tobacco: Never Used  Substance and Sexual Activity  . Alcohol use: Yes    Alcohol/week: 1.8 oz    Types: 3 Glasses of wine per week  . Drug use: No  . Sexual activity: Yes    Partners: Female  Lifestyle  . Physical activity:    Days per week: Not on file    Minutes per session: Not on file  . Stress: Not on file  Relationships  . Social connections:    Talks on phone: Not on file    Gets together: Not on file    Attends religious service: Not on file    Active member of club or organization: Not on file    Attends meetings of clubs or organizations: Not on file    Relationship status: Not on file  .  Intimate partner violence:    Fear of current or ex partner: Not on file    Emotionally abused: Not on file    Physically abused: Not on file    Forced sexual activity: Not on file  Other Topics Concern  . Not on file  Social History Narrative   HSG 2 years trade school. married 1965. 2 daughters- '66, '71. 2 grandchldren. 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     Current Outpatient Medications:  .  acarbose (PRECOSE) 50 MG tablet, TAKE 1 TABLET(50 MG) BY MOUTH THREE TIMES DAILY WITH MEALS, Disp: 90 tablet, Rfl: 2 .  aspirin EC 81 MG tablet, Take 1 tablet (81 mg total) by mouth daily., Disp: 150 tablet, Rfl: 2 .  glimepiride (AMARYL) 2 MG tablet, Take 2 mg by mouth daily with breakfast., Disp: , Rfl:  .  glucose blood (FREESTYLE TEST STRIPS) test strip, Use to check blood sugars twice a day Dx CODE: E11.9, Disp: 150 each, Rfl: 5 .  Lancets (FREESTYLE) lancets, Use to check blood sugars two times daily. Dx Code: E11.9, Disp: 200 each, Rfl: 3 .  losartan (COZAAR) 50 MG tablet, Take 1 tablet by mouth daily., Disp: 90 tablet, Rfl: 2 .  simvastatin (ZOCOR) 20 MG tablet, Take 1 tablet (20 mg total) by mouth at bedtime., Disp: 90 tablet, Rfl: 1 .  sitaGLIPtin (JANUVIA) 100 MG tablet, Take 1 tablet (100 mg total) by mouth daily., Disp: 30 tablet, Rfl: 0 .  traMADol (ULTRAM) 50 MG tablet, Take 1 tablet (50 mg total) by mouth every 8 (eight) hours as needed., Disp: 20 tablet, Rfl: 0 .  vitamin B-12 (CYANOCOBALAMIN) 1000 MCG tablet, Take 1,000 mcg by mouth daily., Disp: , Rfl:   Current Facility-Administered Medications:  .  lidocaine (PF) (XYLOCAINE) 1 % injection 0.3 mL, 0.3 mL, Other, Once, Magnus Sinning, MD  Allergies  Allergen Reactions  . Metformin And Related Other (See Comments)    GI upset, diarrhea, wt loss     ROS See HPI  Objective  Vitals:   11/25/17 1152  BP: 134/82  Pulse: 67  Resp: 16  Temp: 97.9 F (36.6 C)  TempSrc: Oral  SpO2: 96%   Weight: 164 lb (74.4 kg)  Height: 5\' 6"  (1.676 m)    Body mass index is 26.47 kg/m.  Physical Exam Vital signs reviewed. Constitutional: Patient appears well-developed and well-nourished. No distress.  HENT: Head: Normocephalic and atraumatic.Nose: Nose normal. Mouth/Throat: Oropharynx is clear and moist. No oropharyngeal exudate.  Eyes: Conjunctivae and EOM are normal. Pupils are equal, round, and reactive to light. No scleral icterus.  Neck: Normal range of motion. Neck supple. Cardiovascular: Normal rate, regular rhythm and  normal heart sounds. No BLE edema. Distal pulses intact. Pulmonary/Chest: Effort normal and breath sounds normal. No respiratory distress. Musculoskeletal: Normal range of motion. No gross deformities. Left elbow without erythema, swelling. Neurological: He is alert and oriented to person, place, and time. Coordination, balance, strength, speech and gait are normal.  Skin: Skin is warm and dry. No rash noted. No erythema.  Psychiatric: Patient has a normal mood and affect. behavior is normal. Judgment and thought content normal.  Assessment & Plan RTC in august for CPE; DM- 6 month F/U  -Reviewed Health Maintenance: up to date  Left elbow pain Discussed home management of arthritis including use of OTC NSAIDS for pain and return precautions, and printed additional information in AVS Recommended F/U with orthopedic provider or sports medicine providers here in our clinic for worsening or if no improvement in his pain

## 2017-11-25 NOTE — Patient Instructions (Addendum)
Thank you for coming in for medication update We will continue your diabetes medications as follows: Glimepiride 2mg  once daily januvia 100 mg once daily Acarbose 50mg  three times daily with meals Please notify me for blood sugar readings less than 80, greater than 200 at home.  Ill see you in august for your annual check up and we will recheck your A1c then.  Please schedule a follow up appointment for further evaluation here with Dr Tamala Julian or Dr Raeford Razor, our sports medicine providers if you continue to experience elbow pain.   Osteoarthritis Osteoarthritis is a type of arthritis that affects tissue that covers the ends of bones in joints (cartilage). Cartilage acts as a cushion between the bones and helps them move smoothly. Osteoarthritis results when cartilage in the joints gets worn down. Osteoarthritis is sometimes called "wear and tear" arthritis. Osteoarthritis is the most common form of arthritis. It often occurs in older people. It is a condition that gets worse over time (a progressive condition). Joints that are most often affected by this condition are in:  Fingers.  Toes.  Hips.  Knees.  Spine, including neck and lower back.  What are the causes? This condition is caused by age-related wearing down of cartilage that covers the ends of bones. What increases the risk? The following factors may make you more likely to develop this condition:  Older age.  Being overweight or obese.  Overuse of joints, such as in athletes.  Past injury of a joint.  Past surgery on a joint.  Family history of osteoarthritis.  What are the signs or symptoms? The main symptoms of this condition are pain, swelling, and stiffness in the joint. The joint may lose its shape over time. Small pieces of bone or cartilage may break off and float inside of the joint, which may cause more pain and damage to the joint. Small deposits of bone (osteophytes) may grow on the edges of the joint.  Other symptoms may include:  A grating or scraping feeling inside the joint when you move it.  Popping or creaking sounds when you move.  Symptoms may affect one or more joints. Osteoarthritis in a major joint, such as your knee or hip, can make it painful to walk or exercise. If you have osteoarthritis in your hands, you might not be able to grip items, twist your hand, or control small movements of your hands and fingers (fine motor skills). How is this diagnosed? This condition may be diagnosed based on:  Your medical history.  A physical exam.  Your symptoms.  X-rays of the affected joint(s).  Blood tests to rule out other types of arthritis.  How is this treated? There is no cure for this condition, but treatment can help to control pain and improve joint function. Treatment plans may include:  A prescribed exercise program that allows for rest and joint relief. You may work with a physical therapist.  A weight control plan.  Pain relief techniques, such as: ? Applying heat and cold to the joint. ? Electric pulses delivered to nerve endings under the skin (transcutaneous electrical nerve stimulation, or TENS). ? Massage. ? Certain nutritional supplements.  NSAIDs or prescription medicines to help relieve pain.  Medicine to help relieve pain and inflammation (corticosteroids). This can be given by mouth (orally) or as an injection.  Assistive devices, such as a brace, wrap, splint, specialized glove, or cane.  Surgery, such as: ? An osteotomy. This is done to reposition the bones and relieve  pain or to remove loose pieces of bone and cartilage. ? Joint replacement surgery. You may need this surgery if you have very bad (advanced) osteoarthritis.  Follow these instructions at home: Activity  Rest your affected joints as directed by your health care provider.  Do not drive or use heavy machinery while taking prescription pain medicine.  Exercise as directed. Your  health care provider or physical therapist may recommend specific types of exercise, such as: ? Strengthening exercises. These are done to strengthen the muscles that support joints that are affected by arthritis. They can be performed with weights or with exercise bands to add resistance. ? Aerobic activities. These are exercises, such as brisk walking or water aerobics, that get your heart pumping. ? Range-of-motion activities. These keep your joints easy to move. ? Balance and agility exercises. Managing pain, stiffness, and swelling  If directed, apply heat to the affected area as often as told by your health care provider. Use the heat source that your health care provider recommends, such as a moist heat pack or a heating pad. ? If you have a removable assistive device, remove it as told by your health care provider. ? Place a towel between your skin and the heat source. If your health care provider tells you to keep the assistive device on while you apply heat, place a towel between the assistive device and the heat source. ? Leave the heat on for 20-30 minutes. ? Remove the heat if your skin turns bright red. This is especially important if you are unable to feel pain, heat, or cold. You may have a greater risk of getting burned.  If directed, put ice on the affected joint: ? If you have a removable assistive device, remove it as told by your health care provider. ? Put ice in a plastic bag. ? Place a towel between your skin and the bag. If your health care provider tells you to keep the assistive device on during icing, place a towel between the assistive device and the bag. ? Leave the ice on for 20 minutes, 2-3 times a day. General instructions  Take over-the-counter and prescription medicines only as told by your health care provider.  Maintain a healthy weight. Follow instructions from your health care provider for weight control. These may include dietary restrictions.  Do not  use any products that contain nicotine or tobacco, such as cigarettes and e-cigarettes. These can delay bone healing. If you need help quitting, ask your health care provider.  Use assistive devices as directed by your health care provider.  Keep all follow-up visits as told by your health care provider. This is important. Where to find more information:  [employer] of Arthritis and Musculoskeletal and Skin Diseases: www.niams.SouthExposed.es  [employer] on Aging: http://kim-miller.com/  American College of Rheumatology: www.rheumatology.org Contact a health care provider if:  Your skin turns red.  You develop a rash.  You have pain that gets worse.  You have a fever along with joint or muscle aches. Get help right away if:  You lose a lot of weight.  You suddenly lose your appetite.  You have night sweats. Summary  Osteoarthritis is a type of arthritis that affects tissue covering the ends of bones in joints (cartilage).  This condition is caused by age-related wearing down of cartilage that covers the ends of bones.  The main symptom of this condition is pain, swelling, and stiffness in the joint.  There is no cure for this condition,  but treatment can help to control pain and improve joint function. This information is not intended to replace advice given to you by your health care provider. Make sure you discuss any questions you have with your health care provider. Document Released: 06/24/2005 Document Revised: 02/26/2016 Document Reviewed: 02/26/2016 Elsevier Interactive Patient Education  [employer].

## 2017-12-01 ENCOUNTER — Encounter: Payer: Self-pay | Admitting: Nurse Practitioner

## 2017-12-01 NOTE — Assessment & Plan Note (Addendum)
Recent A1c reflects adequate control of his diabetes He is a good historian, knowledgeable about his chronic medical conditions and daily medication list, and despite discrepancies with his electronic medication list, I believe he has reported correct mediation list to me today. I have updated his electronic med list to update this He will continue his medications at current dosages and F/U in August for updated A1c, or sooner for home glucose readings <80, >200 - glimepiride (AMARYL) 2 MG tablet; Take 1 tablet (2 mg total) by mouth daily with breakfast.  Dispense: 90 tablet; Refill: 2

## 2017-12-02 ENCOUNTER — Other Ambulatory Visit: Payer: Self-pay

## 2017-12-02 MED ORDER — LOSARTAN POTASSIUM 50 MG PO TABS: ORAL_TABLET | ORAL | 0 refills | Status: DC

## 2017-12-12 ENCOUNTER — Ambulatory Visit: Payer: Medicare Other | Admitting: Nurse Practitioner

## 2017-12-22 DIAGNOSIS — R972 Elevated prostate specific antigen [PSA]: Secondary | ICD-10-CM | POA: Diagnosis not present

## 2017-12-22 DIAGNOSIS — N5201 Erectile dysfunction due to arterial insufficiency: Secondary | ICD-10-CM | POA: Diagnosis not present

## 2018-01-12 ENCOUNTER — Telehealth: Payer: Self-pay | Admitting: Family

## 2018-01-13 NOTE — Telephone Encounter (Signed)
Pt called and stated his med ACERBOSE was sent in for 90 with no refills.  Please advise he states he normally gets for 3 months

## 2018-01-14 MED ORDER — ACARBOSE 50 MG PO TABS: ORAL_TABLET | ORAL | 1 refills | Status: DC

## 2018-01-14 NOTE — Telephone Encounter (Signed)
Spoke with patient and advised that since he has already picked up the rx for the quantity of 90 then we would have to wait until next refill to send in the 90 day supply due to insurance coverage purposes. Pt understood.

## 2018-01-14 NOTE — Addendum Note (Signed)
Addended by: Delice Bison E on: 01/14/2018 09:02 AM   Modules accepted: Orders

## 2018-01-14 NOTE — Telephone Encounter (Signed)
Patient checking status, call back [phone]

## 2018-01-22 DIAGNOSIS — R972 Elevated prostate specific antigen [PSA]: Secondary | ICD-10-CM | POA: Diagnosis not present

## 2018-02-09 ENCOUNTER — Encounter: Payer: Medicare Other | Admitting: Nurse Practitioner

## 2018-02-09 ENCOUNTER — Other Ambulatory Visit: Payer: Self-pay | Admitting: Family

## 2018-02-09 ENCOUNTER — Other Ambulatory Visit: Payer: Self-pay | Admitting: Nurse Practitioner

## 2018-02-13 ENCOUNTER — Other Ambulatory Visit (INDEPENDENT_AMBULATORY_CARE_PROVIDER_SITE_OTHER): Payer: Medicare Other

## 2018-02-13 ENCOUNTER — Ambulatory Visit (INDEPENDENT_AMBULATORY_CARE_PROVIDER_SITE_OTHER): Payer: Medicare Other | Admitting: Nurse Practitioner

## 2018-02-13 ENCOUNTER — Encounter: Payer: Self-pay | Admitting: Nurse Practitioner

## 2018-02-13 VITALS — BP 118/78 | HR 68 | Temp 98.0°F | Resp 16 | Ht 66.0 in | Wt 159.0 lb

## 2018-02-13 DIAGNOSIS — Z0000 Encounter for general adult medical examination without abnormal findings: Secondary | ICD-10-CM

## 2018-02-13 DIAGNOSIS — I10 Essential (primary) hypertension: Secondary | ICD-10-CM | POA: Diagnosis not present

## 2018-02-13 DIAGNOSIS — E782 Mixed hyperlipidemia: Secondary | ICD-10-CM

## 2018-02-13 DIAGNOSIS — E538 Deficiency of other specified B group vitamins: Secondary | ICD-10-CM | POA: Insufficient documentation

## 2018-02-13 DIAGNOSIS — E118 Type 2 diabetes mellitus with unspecified complications: Secondary | ICD-10-CM

## 2018-02-13 LAB — COMPREHENSIVE METABOLIC PANEL
ALBUMIN: 4.5 g/dL (ref 3.5–5.2)
ALT: 18 U/L (ref 0–53)
AST: 14 U/L (ref 0–37)
Alkaline Phosphatase: 70 U/L (ref 39–117)
BILIRUBIN TOTAL: 0.8 mg/dL (ref 0.2–1.2)
BUN: 15 mg/dL (ref 6–23)
CALCIUM: 10.1 mg/dL (ref 8.4–10.5)
CHLORIDE: 105 meq/L (ref 96–112)
CO2: 28 mEq/L (ref 19–32)
CREATININE: 0.93 mg/dL (ref 0.40–1.50)
GFR: 101.57 mL/min (ref >60.00)
Glucose, Bld: 143 mg/dL — ABNORMAL HIGH (ref 70–99)
Potassium: 4.4 mEq/L (ref 3.5–5.1)
Sodium: 141 mEq/L (ref 135–145)
Total Protein: 7.4 g/dL (ref 6.0–8.3)

## 2018-02-13 LAB — TSH: TSH: 0.83 u[IU]/mL (ref 0.35–4.50)

## 2018-02-13 LAB — LIPID PANEL
CHOLESTEROL: 131 mg/dL (ref 0–200)
HDL: 55.1 mg/dL (ref >39.00)
LDL CALC: 59 mg/dL (ref 0–99)
NonHDL: 76.01
TRIGLYCERIDES: 83 mg/dL (ref 0.0–149.0)
Total CHOL/HDL Ratio: 2
VLDL: 16.6 mg/dL (ref 0.0–40.0)

## 2018-02-13 LAB — CBC
HCT: 39.3 % (ref 39.0–52.0)
Hemoglobin: 12.8 g/dL — ABNORMAL LOW (ref 13.0–17.0)
MCHC: 32.5 g/dL (ref 30.0–36.0)
MCV: 80 fl (ref 78.0–100.0)
PLATELETS: 125 10*3/uL — AB (ref 150.0–400.0)
RBC: 4.91 Mil/uL (ref 4.22–5.81)
RDW: 14.9 % (ref 11.5–15.5)
WBC: 4.9 10*3/uL (ref 4.0–10.5)

## 2018-02-13 LAB — VITAMIN B12: VITAMIN B 12: 1371 pg/mL — AB (ref 211–911)

## 2018-02-13 LAB — HEMOGLOBIN A1C: Hgb A1c MFr Bld: 7.4 % — ABNORMAL HIGH (ref 4.6–6.5)

## 2018-02-13 NOTE — Assessment & Plan Note (Signed)
Continue current medications Update labs-he is fasting today - CBC; Future - Lipid panel; Future - TSH; Future

## 2018-02-13 NOTE — Assessment & Plan Note (Signed)
Continue current medications Update labs F/U with further recommendations pending lab results - Comprehensive metabolic panel; Future - Lipid panel; Future - Hemoglobin A1c; Future

## 2018-02-13 NOTE — Assessment & Plan Note (Signed)
-  USPSTF grade A and B recommendations reviewed with patient; age-appropriate recommendations, preventive care, screening tests, etc discussed and encouraged; healthy living encouraged; see AVS for patient education given to patient. Advanced directives packet provided to patient -Discussed importance of 150 minutes of physical activity weekly, 6 servings of fruit/vegetables daily and drink plenty of water and avoid sweet beverages.  -Follow up and care instructions discussed and provided in AVS.   -Reviewed Health Maintenance:  -Reports annual ophthalmology exam scheduled next month at [facility] opthalmology - Hemoglobin A1c; Future

## 2018-02-13 NOTE — Assessment & Plan Note (Signed)
Stable Continue current medications Update labs - CBC; Future - Comprehensive metabolic panel; Future - Lipid panel; Future - TSH; Future

## 2018-02-13 NOTE — Patient Instructions (Addendum)
Please head downstairs for lab work. If any of your test results are critically abnormal, you will be contacted right away. Otherwise, I will contact you within a week about your test results and follow up recommendations  Please return to see me in about 6 months for routine diabetes follow up, or sooner if needed.  It was nice to see you. Thanks for letting me take care of you today.   Health Maintenance, Male A healthy lifestyle and preventive care is important for your health and wellness. Ask your health care provider about what schedule of regular examinations is right for you. What should I know about weight and diet? Eat a Healthy Diet  Eat plenty of vegetables, fruits, whole grains, low-fat dairy products, and lean protein.  Do not eat a lot of foods high in solid fats, added sugars, or salt.  Maintain a Healthy Weight Regular exercise can help you achieve or maintain a healthy weight. You should:  Do at least 150 minutes of exercise each week. The exercise should increase your heart rate and make you sweat (moderate-intensity exercise).  Do strength-training exercises at least twice a week.  Watch Your Levels of Cholesterol and Blood Lipids  Have your blood tested for lipids and cholesterol every 5 years starting at 76 years of age. If you are at high risk for heart disease, you should start having your blood tested when you are 76 years old. You may need to have your cholesterol levels checked more often if: ? Your lipid or cholesterol levels are high. ? You are older than 76 years of age. ? You are at high risk for heart disease.  What should I know about cancer screening? Many types of cancers can be detected early and may often be prevented. Lung Cancer  You should be screened every year for lung cancer if: ? You are a current smoker who has smoked for at least 30 years. ? You are a former smoker who has quit within the past 15 years.  Talk to your health care  provider about your screening options, when you should start screening, and how often you should be screened.  Colorectal Cancer  Routine colorectal cancer screening usually begins at 76 years of age and should be repeated every 5-10 years until you are 76 years old. You may need to be screened more often if early forms of precancerous polyps or small growths are found. Your health care provider may recommend screening at an earlier age if you have risk factors for colon cancer.  Your health care provider may recommend using home test kits to check for hidden blood in the stool.  A small camera at the end of a tube can be used to examine your colon (sigmoidoscopy or colonoscopy). This checks for the earliest forms of colorectal cancer.  Prostate and Testicular Cancer  Depending on your age and overall health, your health care provider may do certain tests to screen for prostate and testicular cancer.  Talk to your health care provider about any symptoms or concerns you have about testicular or prostate cancer.  Skin Cancer  Check your skin from head to toe regularly.  Tell your health care provider about any new moles or changes in moles, especially if: ? There is a change in a mole's size, shape, or color. ? You have a mole that is larger than a pencil eraser.  Always use sunscreen. Apply sunscreen liberally and repeat throughout the day.  Protect yourself by wearing  long sleeves, pants, a wide-brimmed hat, and sunglasses when outside.  What should I know about heart disease, diabetes, and high blood pressure?  If you are 80-23 years of age, have your blood pressure checked every 3-5 years. If you are 65 years of age or older, have your blood pressure checked every year. You should have your blood pressure measured twice-once when you are at a hospital or clinic, and once when you are not at a hospital or clinic. Record the average of the two measurements. To check your blood pressure  when you are not at a hospital or clinic, you can use: ? An automated blood pressure machine at a pharmacy. ? A home blood pressure monitor.  Talk to your health care provider about your target blood pressure.  If you are between 57-21 years old, ask your health care provider if you should take aspirin to prevent heart disease.  Have regular diabetes screenings by checking your fasting blood sugar level. ? If you are at a normal weight and have a low risk for diabetes, have this test once every three years after the age of 79. ? If you are overweight and have a high risk for diabetes, consider being tested at a younger age or more often.  A one-time screening for abdominal aortic aneurysm (AAA) by ultrasound is recommended for men aged 1-75 years who are current or former smokers. What should I know about preventing infection? Hepatitis B If you have a higher risk for hepatitis B, you should be screened for this virus. Talk with your health care provider to find out if you are at risk for hepatitis B infection. Hepatitis C Blood testing is recommended for:  Everyone born from [birth year] through 1965.  Anyone with known risk factors for hepatitis C.  Sexually Transmitted Diseases (STDs)  You should be screened each year for STDs including gonorrhea and chlamydia if: ? You are sexually active and are younger than 76 years of age. ? You are older than 76 years of age and your health care provider tells you that you are at risk for this type of infection. ? Your sexual activity has changed since you were last screened and you are at an increased risk for chlamydia or gonorrhea. Ask your health care provider if you are at risk.  Talk with your health care provider about whether you are at high risk of being infected with HIV. Your health care provider may recommend a prescription medicine to help prevent HIV infection.  What else can I do?  Schedule regular health, dental, and eye  exams.  Stay current with your vaccines (immunizations).  Do not use any tobacco products, such as cigarettes, chewing tobacco, and e-cigarettes. If you need help quitting, ask your health care provider.  Limit alcohol intake to no more than 2 drinks per day. One drink equals 12 ounces of beer, 5 ounces of wine, or 1 ounces of hard liquor.  Do not use street drugs.  Do not share needles.  Ask your health care provider for help if you need support or information about quitting drugs.  Tell your health care provider if you often feel depressed.  Tell your health care provider if you have ever been abused or do not feel safe at home. This information is not intended to replace advice given to you by your health care provider. Make sure you discuss any questions you have with your health care provider. Document Released: 12/21/2007 Document Revised: 02/21/2016 Document Reviewed:  03/28/2015 Elsevier Interactive Patient Education  [employer].

## 2018-02-13 NOTE — Progress Notes (Signed)
Name: Alex Massey   MRN: [id number]    DOB: 19-Apr-1941   Date:02/13/2018       Progress Note  Subjective  Chief Complaint  Chief Complaint  Patient presents with  . CPE    fasting     HPI  Patient presents for annual CPE.  USPSTF grade A and B recommendations:  Diet, Exercise: active around the house, garden, walks 2.5 miles a day, tries to follow a healthy diet   Depression: no concerns Depression screen [facility] 2/9 02/06/2017 02/05/2016 02/01/2015 09/12/2014 04/06/2014  Decreased Interest 0 0 0 0 0  Down, Depressed, Hopeless 0 0 0 0 0  PHQ - 2 Score 0 0 0 0 0    Hypertension -maintained on losartan 50 daily Reports daily medication compliance without adverse medication effects.  BP Readings from Last 3 Encounters:  02/13/18 118/78  11/25/17 134/82  10/08/17 (!) 160/93    Obesity: Wt Readings from Last 3 Encounters:  02/13/18 159 lb (72.1 kg)  11/25/17 164 lb (74.4 kg)  09/26/17 164 lb (74.4 kg)   BMI Readings from Last 3 Encounters:  02/13/18 25.66 kg/m  11/25/17 26.47 kg/m  09/26/17 26.47 kg/m    Cholesterol- maintained on simvastatin 20 daily  Reports daily medication compliance without adverse medication effects including nausea, myalgias.  Lab Results  Component Value Date   CHOL 141 02/06/2017   HDL 55.70 02/06/2017   LDLCALC 56 02/06/2017   TRIG 148.0 02/06/2017   CHOLHDL 3 02/06/2017   Diabetes- maintained on  Acarbose 50 TID, januvia 100 daily, amaryl 2 qam Reports daily medication compliance without adverse medication effects. Reports he regularly checks his glucose readings at home, recent  readings 140s.  Lab Results  Component Value Date   HGBA1C 7.6 (H) 09/26/2017    Alcohol: 1 scotch on Friday , Saturday Tobacco use: no, former smoker, quit >15 years ago  Skin cancer: routinely wears sunscreen  Colorectal cancer: colonoscopy up to date  Aspirin: taking 81 daily ECG:  Not indicated  Vaccinations: up to date  Advanced Care  Planning: A voluntary discussion about advance care planning including the explanation and discussion of advance directives.  Discussed health care proxy and Living will, and the patient DOES NOT have a living will at present time. If patient does have living will, I have requested they bring this to the clinic to be scanned in to their chart.  Patient Active Problem List   Diagnosis Date Noted  . Lumbar radiculopathy 07/09/2016  . Radiculopathy due to lumbar intervertebral disc disorder 07/09/2016  . Medicare annual wellness visit, subsequent 02/05/2016  . Hemorrhoid 08/07/2015  . BPH without obstruction/lower urinary tract symptoms 02/01/2015  . Essential hypertension 09/12/2014  . Routine general medical examination at a health care facility 01/14/2011  . ERECTILE DYSFUNCTION 04/04/2008  . Hyperlipidemia 06/26/2007  . Diabetes ([place]) 06/25/2007  . NEPHROLITHIASIS, HX OF 06/25/2007    Past Surgical History:  Procedure Laterality Date  . L-spine surgery  [id number]  . LUMBAR EPIDURAL INJECTION  2009, 2006   dr newton    Family History  Problem Relation Age of Onset  . Healthy Mother   . Diabetes Father   . Colon cancer Brother 46    Social History   Socioeconomic History  . Marital status: Married    Spouse name: Not on file  . Number of children: 2  . Years of education: 40  . Highest education level: Not on file  Occupational History    Comment:  retired  Scientific laboratory technician  . Financial resource strain: Not on file  . Food insecurity:    Worry: Not on file    Inability: Not on file  . Transportation needs:    Medical: Not on file    Non-medical: Not on file  Tobacco Use  . Smoking status: Former Smoker    Types: Cigarettes    Last attempt to quit: 07/09/2004    Years since quitting: 13.6  . Smokeless tobacco: Never Used  Substance and Sexual Activity  . Alcohol use: Yes    Alcohol/week: 3.0 standard drinks    Types: 3 Glasses of wine per week  . Drug use: No  .  Sexual activity: Yes    Partners: Female  Lifestyle  . Physical activity:    Days per week: Not on file    Minutes per session: Not on file  . Stress: Not on file  Relationships  . Social connections:    Talks on phone: Not on file    Gets together: Not on file    Attends religious service: Not on file    Active member of club or organization: Not on file    Attends meetings of clubs or organizations: Not on file    Relationship status: Not on file  . Intimate partner violence:    Fear of current or ex partner: Not on file    Emotionally abused: Not on file    Physically abused: Not on file    Forced sexual activity: Not on file  Other Topics Concern  . Not on file  Social History Narrative   HSG 2 years trade school. married 1965. 2 daughters- '66, '71. 2 grandchldren. 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     Current Outpatient Medications:  .  acarbose (PRECOSE) 50 MG tablet, Take 1 tablet by mouth 3 times daily with meals., Disp: 270 tablet, Rfl: 1 .  acarbose (PRECOSE) 50 MG tablet, TAKE 1 TABLET BY MOUTH THREE TIMES DAILY WITH MEALS... NEED OFFICE VISIT FOR FURTHER REFILLS, Disp: 270 tablet, Rfl: 0 .  aspirin EC 81 MG tablet, Take 1 tablet (81 mg total) by mouth daily., Disp: 150 tablet, Rfl: 2 .  glimepiride (AMARYL) 2 MG tablet, Take 1 tablet (2 mg total) by mouth daily with breakfast., Disp: 90 tablet, Rfl: 2 .  glucose blood (FREESTYLE TEST STRIPS) test strip, Use to check blood sugars twice a day Dx CODE: E11.9, Disp: 150 each, Rfl: 5 .  Lancets (FREESTYLE) lancets, Use to check blood sugars two times daily. Dx Code: E11.9, Disp: 200 each, Rfl: 3 .  losartan (COZAAR) 50 MG tablet, Take 1 tablet by mouth daily., Disp: 90 tablet, Rfl: 0 .  simvastatin (ZOCOR) 20 MG tablet, Take 1 tablet (20 mg total) by mouth at bedtime., Disp: 90 tablet, Rfl: 1 .  sitaGLIPtin (JANUVIA) 100 MG tablet, Take 1 tablet (100 mg total) by mouth daily., Disp: 30  tablet, Rfl: 0 .  traMADol (ULTRAM) 50 MG tablet, Take 1 tablet (50 mg total) by mouth every 8 (eight) hours as needed., Disp: 20 tablet, Rfl: 0 .  vitamin B-12 (CYANOCOBALAMIN) 1000 MCG tablet, Take 1,000 mcg by mouth daily., Disp: , Rfl:   Current Facility-Administered Medications:  .  lidocaine (PF) (XYLOCAINE) 1 % injection 0.3 mL, 0.3 mL, Other, Once, Magnus Sinning, MD  Allergies  Allergen Reactions  . Metformin And Related Other (See Comments)    GI upset, diarrhea, wt loss  ROS  Constitutional: Negative for fever or weight change.  Respiratory: Negative for cough and shortness of breath.   Cardiovascular: Negative for chest pain or palpitations.  Gastrointestinal: Negative for abdominal pain, no bowel changes.  Musculoskeletal: Negative for gait problem or joint swelling.  Skin: Negative for rash.  Neurological: Negative for dizziness or headache.  No other specific complaints in a complete review of systems (except as listed in HPI above).   Objective  Vitals:   02/13/18 0916  BP: 118/78  Pulse: 68  Resp: 16  Temp: 98 F (36.7 C)  TempSrc: Oral  SpO2: 97%  Weight: 159 lb (72.1 kg)  Height: 5\' 6"  (1.676 m)    Body mass index is 25.66 kg/m.  Physical Exam Vital signs reviewed. Constitutional: Patient appears well-developed and well-nourished. No distress.  HENT: Head: Normocephalic and atraumatic. Ears: B TMs ok, no erythema or effusion; Nose: Nose normal. Mouth/Throat: Oropharynx is clear and moist. No oropharyngeal exudate.  Eyes: Conjunctivae and EOM are normal. Pupils are equal, round, and reactive to light. No scleral icterus.  Neck: Normal range of motion. Neck supple. No cervical adenopathy. No thyromegaly present.  Cardiovascular: Normal rate, regular rhythm and normal heart sounds.  No murmur heard. Distal pulses intact Pulmonary/Chest: Effort normal and breath sounds normal. No respiratory distress. Abdominal: Soft. Bowel sounds are normal, no  distension. There is no tenderness. no masses Musculoskeletal: Normal range of motion,  No gross deformities Neurological: he is alert and oriented to person, place, and time. No cranial nerve deficit. Coordination, balance, strength, speech and gait are normal.  Skin: Skin is warm and dry. No rash noted. No erythema.  Psychiatric: Patient has a normal mood and affect. behavior is normal. Judgment and thought content normal.  Assessment & Plan RTC in 6 months for FU: DM- routine follow up

## 2018-02-13 NOTE — Assessment & Plan Note (Signed)
Continue vitamin b12 supplement daily Update labs today - Vitamin B12; Future

## 2018-02-19 ENCOUNTER — Other Ambulatory Visit: Payer: Self-pay | Admitting: Nurse Practitioner

## 2018-02-19 DIAGNOSIS — R899 Unspecified abnormal finding in specimens from other organs, systems and tissues: Secondary | ICD-10-CM

## 2018-02-21 ENCOUNTER — Other Ambulatory Visit: Payer: Self-pay | Admitting: Nurse Practitioner

## 2018-02-24 DIAGNOSIS — H524 Presbyopia: Secondary | ICD-10-CM | POA: Diagnosis not present

## 2018-03-02 ENCOUNTER — Other Ambulatory Visit: Payer: Self-pay | Admitting: Nurse Practitioner

## 2018-03-10 ENCOUNTER — Other Ambulatory Visit: Payer: Self-pay | Admitting: Family

## 2018-03-11 ENCOUNTER — Other Ambulatory Visit: Payer: Self-pay | Admitting: *Deleted

## 2018-03-11 MED ORDER — GLUCOSE BLOOD VI STRP: ORAL_STRIP | 5 refills | Status: DC

## 2018-03-12 DIAGNOSIS — E119 Type 2 diabetes mellitus without complications: Secondary | ICD-10-CM | POA: Diagnosis not present

## 2018-03-16 ENCOUNTER — Telehealth: Payer: Self-pay

## 2018-03-16 ENCOUNTER — Ambulatory Visit (INDEPENDENT_AMBULATORY_CARE_PROVIDER_SITE_OTHER): Payer: Medicare Other

## 2018-03-16 DIAGNOSIS — Z23 Encounter for immunization: Secondary | ICD-10-CM

## 2018-03-16 MED ORDER — SITAGLIPTIN PHOSPHATE 100 MG PO TABS: 100.0000 mg | ORAL_TABLET | Freq: Every day | ORAL | 1 refills | Status: DC

## 2018-03-16 NOTE — Addendum Note (Signed)
Addended by: Cresenciano Lick on: 03/16/2018 04:50 PM   Modules accepted: Orders

## 2018-03-16 NOTE — Telephone Encounter (Signed)
Patient has dropped off paperwork for patient assistance with [place] (mail order)---paperwork placed in [employer]

## 2018-03-16 NOTE — Telephone Encounter (Signed)
I phoned in refill for Januvia to [employer] patient assistance. Pharmacist I spoke to states medication will be shipped to patient. See meds. Pt informed  Rx # [id number] Account #: [id number]

## 2018-03-23 ENCOUNTER — Other Ambulatory Visit (INDEPENDENT_AMBULATORY_CARE_PROVIDER_SITE_OTHER): Payer: Medicare Other

## 2018-03-23 DIAGNOSIS — R899 Unspecified abnormal finding in specimens from other organs, systems and tissues: Secondary | ICD-10-CM | POA: Diagnosis not present

## 2018-03-23 LAB — CBC
HEMATOCRIT: 39.8 % (ref 39.0–52.0)
HEMOGLOBIN: 13.2 g/dL (ref 13.0–17.0)
MCHC: 33.1 g/dL (ref 30.0–36.0)
MCV: 79.1 fl (ref 78.0–100.0)
Platelets: 131 10*3/uL — ABNORMAL LOW (ref 150.0–400.0)
RBC: 5.03 Mil/uL (ref 4.22–5.81)
RDW: 14.8 % (ref 11.5–15.5)
WBC: 5.3 10*3/uL (ref 4.0–10.5)

## 2018-03-24 ENCOUNTER — Telehealth: Payer: Self-pay

## 2018-03-24 MED ORDER — SITAGLIPTIN PHOSPHATE 100 MG PO TABS: 100.0000 mg | ORAL_TABLET | Freq: Every day | ORAL | 0 refills | Status: DC

## 2018-03-24 NOTE — Telephone Encounter (Signed)
Alex Massey, I dont see any message to reply to?

## 2018-03-24 NOTE — Telephone Encounter (Signed)
I called pt- he states he is running low on Januvia 100 mg. He is requesting samples of Januvia while he is waiting for his patient assistance order to be shipped. I also re-informed him of his recent CBC labs and to keep ROV with PCP in 08/2017.  Samples upfront. Pt informed

## 2018-03-24 NOTE — Addendum Note (Signed)
Addended by: Cresenciano Lick on: 03/24/2018 01:50 PM   Modules accepted: Orders

## 2018-03-24 NOTE — Telephone Encounter (Signed)
Patient is requesting a call back from [place].   [phone]

## 2018-03-24 NOTE — Telephone Encounter (Signed)
His repeat CBC is stable; keep planned follow-up with me for February. Looks like Alex Massey did call him with this information yesterday and left a voicemail. thx!

## 2018-04-20 DIAGNOSIS — N5201 Erectile dysfunction due to arterial insufficiency: Secondary | ICD-10-CM | POA: Diagnosis not present

## 2018-04-20 DIAGNOSIS — R972 Elevated prostate specific antigen [PSA]: Secondary | ICD-10-CM | POA: Diagnosis not present

## 2018-04-27 ENCOUNTER — Ambulatory Visit (INDEPENDENT_AMBULATORY_CARE_PROVIDER_SITE_OTHER): Payer: Self-pay

## 2018-04-27 ENCOUNTER — Encounter (INDEPENDENT_AMBULATORY_CARE_PROVIDER_SITE_OTHER): Payer: Self-pay | Admitting: Physician Assistant

## 2018-04-27 ENCOUNTER — Ambulatory Visit (INDEPENDENT_AMBULATORY_CARE_PROVIDER_SITE_OTHER): Payer: Medicare Other | Admitting: Physician Assistant

## 2018-04-27 VITALS — Ht 67.0 in | Wt 160.0 lb

## 2018-04-27 DIAGNOSIS — M1712 Unilateral primary osteoarthritis, left knee: Secondary | ICD-10-CM | POA: Diagnosis not present

## 2018-04-27 MED ORDER — METHYLPREDNISOLONE ACETATE 40 MG/ML IJ SUSP
40.0000 mg | INTRAMUSCULAR | Status: AC | PRN
  Administered 2018-04-27: 40 mg via INTRA_ARTICULAR

## 2018-04-27 MED ORDER — LIDOCAINE HCL 1 % IJ SOLN
5.0000 mL | INTRAMUSCULAR | Status: AC | PRN
  Administered 2018-04-27: 5 mL

## 2018-04-27 NOTE — Progress Notes (Signed)
   Procedure Note  Patient: Alex Massey             Date of Birth: 1941-02-21           MRN: [id number]             Visit Date: 04/27/2018 HPI: [name] is well-known to Dr. Ninfa Linden service was seen in for a new problem today of left knee pain.  He states he has had increased swelling and fluid on his left knee for the past 2 weeks.  He states he is having giving way sensation no catching locking or painful popping.  Notes the soreness in the knee.  Is limiting his activities he usually walks 2-2 half miles a day but cannot do this due to stiffness in the knee.  States he has had the knee aspirated in the past.  He has had no problems with the knee recently and no new injury.  He is diabetic last hemoglobin A1c was 7.4 he does check his sugars several times a week.  Review of systems negative outside the HPI  Physical exam: Well-developed well-nourished male no acute distress mood affect appropriate Psych: Alert and oriented x3 Bilateral knees he has good range of motion of both knees.  Negative McMurray's bilaterally.  No instability valgus varus stress of the right knee.  Positive effusion in the left knee no abnormal warmth erythema of either knee.  Radiographs: 3 views left knee no acute fractures.  Knee is well located.  Positive effusion.  Medial joint line narrowing moderate.  Chondrocalcinosis changes noted. Procedures: Visit Diagnoses: Primary osteoarthritis of left knee - Plan: XR KNEE 3 VIEW LEFT  Large Joint Inj: L knee on 04/27/2018 2:59 PM Indications: pain Details: 22 G 1.5 in needle, anterolateral approach  Arthrogram: No  Medications: 40 mg methylPREDNISolone acetate 40 MG/ML; 5 mL lidocaine 1 % Aspirate: 50 mL yellow and blood-tinged Outcome: tolerated well, no immediate complications Procedure, treatment alternatives, risks and benefits explained, specific risks discussed. Consent was given by the patient. Immediately prior to procedure a time out was called to  verify the correct patient, procedure, equipment, support staff and site/side marked as required. Patient was prepped and draped in the usual sterile fashion.    Plan: We will see him back in 2 weeks if he continues to have recurrent effusions or soreness or mechanical symptoms of the knee.  Questions are encouraged and answered.  He is activities as tolerated.  He will work on Forensic scientist.

## 2018-05-20 ENCOUNTER — Encounter (INDEPENDENT_AMBULATORY_CARE_PROVIDER_SITE_OTHER): Payer: Self-pay | Admitting: Physical Medicine and Rehabilitation

## 2018-05-20 ENCOUNTER — Ambulatory Visit (INDEPENDENT_AMBULATORY_CARE_PROVIDER_SITE_OTHER): Payer: Medicare Other | Admitting: Physical Medicine and Rehabilitation

## 2018-05-20 VITALS — BP 161/84 | HR 64 | Ht 67.0 in | Wt 160.0 lb

## 2018-05-20 DIAGNOSIS — M5416 Radiculopathy, lumbar region: Secondary | ICD-10-CM | POA: Diagnosis not present

## 2018-05-20 DIAGNOSIS — R202 Paresthesia of skin: Secondary | ICD-10-CM | POA: Diagnosis not present

## 2018-05-20 MED ORDER — GABAPENTIN 300 MG PO CAPS
300.0000 mg | ORAL_CAPSULE | Freq: Every day | ORAL | 1 refills | Status: DC
Start: 2018-05-20 — End: 2018-11-23

## 2018-05-20 NOTE — Progress Notes (Signed)
 .  Numeric Pain Rating Scale and Functional Assessment Average Pain 4 Pain Right Now 3 My pain is constant, dull and tingling Pain is worse with: some activites Pain improves with: therapy/exercise   In the last MONTH (on 0-10 scale) has pain interfered with the following?  1. General activity like being  able to carry out your everyday physical activities such as walking, climbing stairs, carrying groceries, or moving a chair?  Rating(3)  2. Relation with others like being able to carry out your usual social activities and roles such as  activities at home, at work and in your community. Rating(3)  3. Enjoyment of life such that you have  been bothered by emotional problems such as feeling anxious, depressed or irritable?  Rating(0)

## 2018-05-27 ENCOUNTER — Encounter (INDEPENDENT_AMBULATORY_CARE_PROVIDER_SITE_OTHER): Payer: Self-pay | Admitting: Physical Medicine and Rehabilitation

## 2018-05-27 NOTE — Progress Notes (Signed)
Alex TOOKER - 77 y.o. male MRN [id number]  Date of birth: 01/27/1941  Office Visit Note: Visit Date: 05/20/2018 PCP: Lance Sell, NP Referred by: Lance Sell, NP  Subjective: Chief Complaint  Patient presents with  . Right Foot - Pain  . Right Lower Leg - Pain, Numbness, Tingling  . Spine - Follow-up  . Lower Back - Pain   HPI: Alex Massey is a 77 y.o. male who comes in today As a follow-up for evaluation and management of his back and hip and leg pain with numbness and tingling on the right side.  I remember him quite well but not seen in a couple of years, last seen January 2018.  He states his biggest complaint is pain and numbness in the right foot and lower leg.  He also does get some back pain and some hip pain laterally in the buttocks area.  This is really on the right side and consistent with what we had seen lying last time.  He reports the symptom onset was 2 months ago and he is tried wearing a foot brace and is seen podiatrist.  He reports worsening with walking in cold weather and driving.  He reports his pain is constant dull and tingling.  He says prior to 2 months ago the tingling really was not present.  He does not note any specific injury.  He denies any left-sided complaints.  He gets some improvement with therapy and exercise.  His case is complicated by long-term diabetes type 2.  His last hemoglobin A1c was 7.4.  Initially was followed by Dr. Sharol Given who had been seeing him for his lower extremities and feet.  Most recently has been followed by Dr. Ninfa Linden and Benita Stabile, PA-C mainly for his knee arthritis.  He does have an MRI from 2016 which is reviewed again with the patient and reviewed below.  The last time I saw him we completed right L4 and L5 transforaminal injection based on his symptoms and the MRI findings.  He has had prior lumbar surgery at L4-5 and L5-S1 which were hemilaminectomies.  This did show disc herniation at L4-5 with sequela  of lumbar surgery at this level with right-sided disc herniation as well as lateral recess and foraminal narrowing.  From a medication standpoint he uses tramadol as well as gabapentin.  He is felt to have had some level of polyneuropathy although mild.  Clinically today's symptoms are all one-sided.  Has not had electrodiagnostic studies.  Review of Systems  Constitutional: Negative for chills, fever, malaise/fatigue and weight loss.  HENT: Negative for hearing loss and sinus pain.   Eyes: Negative for blurred vision, double vision and photophobia.  Respiratory: Negative for cough and shortness of breath.   Cardiovascular: Negative for chest pain, palpitations and leg swelling.  Gastrointestinal: Negative for abdominal pain, nausea and vomiting.  Genitourinary: Negative for flank pain.  Musculoskeletal: Positive for back pain and joint pain. Negative for myalgias.       Mainly right lower leg numbness and tingling  Skin: Negative for itching and rash.  Neurological: Positive for tingling. Negative for tremors, focal weakness and weakness.  Endo/Heme/Allergies: Negative.   Psychiatric/Behavioral: Negative for depression.  All other systems reviewed and are negative.  Otherwise per HPI.  Assessment & Plan: Visit Diagnoses:  1. Paresthesia of skin   2. Lumbar radiculopathy     Plan: Findings:  Chronic worsening recalcitrant low back hip and leg pain with paresthesia and numbness  in the right lower extremity and L5 dominant distribution.  He has some weakness in EHL on the right compared to left.  He has had long-term weakness of this foot status post lumbar surgery.  His surgery was many years ago.  He has an AFO that he really only wears when he goes fishing and doing activities it is fairly bulky.  He has had 2 months onset of severe worsening symptoms predominantly when he is active and is more numbness and tingling.  No specific injury.  I still think this is related to his lumbar spine  and the prior surgery and known disc herniation that was present a couple years ago.  He got really good relief with prior epidural injection diagnostically and therapeutically in January 2018.  He has failed recent conservative care with medication management and activity modification.  He has been dealing with this for quite some time.  I do want to schedule him for a right L4 and L5 transforaminal injection diagnostically and hopefully therapeutically.  This would be done with fluoroscopic guidance.  He can continue with his gabapentin and tramadol.  I also wrote a prescription to [employer] for reevaluation of may be a smaller AFO if possible so he can wear that around.  He does get some toe dragging at times.  Patient would represent a decent candidate for spinal cord stimulator trial if the injections and other therapies were fleeting.    Meds & Orders:  Meds ordered this encounter  Medications  . gabapentin (NEURONTIN) 300 MG capsule    Sig: Take 1 capsule (300 mg total) by mouth at bedtime.    Dispense:  90 capsule    Refill:  1   No orders of the defined types were placed in this encounter.   Follow-up: Return for Right L4 and L5 transforaminal epidural steroid injection.   Procedures: No procedures performed  No notes on file   Clinical History: MRI LUMBAR SPINE WITHOUT CONTRAST  TECHNIQUE: Multiplanar, multisequence MR imaging of the lumbar spine was performed. No intravenous contrast was administered.  COMPARISON:  08/02/2007  FINDINGS: Straightening of the normal lumbar lordosis is unchanged. There is no significant listhesis. Vertebral body heights are preserved. There is disc desiccation and severe disc space narrowing at L5-S1 with mild narrowing at L4-5. Mild degenerative marrow changes are present at L5-S1. Subcentimeter T1 hypointense focus in the posterior L3 vertebral body is unchanged and likely benign. Conus medullaris is normal in signal and terminates at L1.  Paraspinal soft tissues are unremarkable.  L1-2:  Negative.  L2-3:  Mild disc bulging without stenosis, unchanged.  L3-4: Mild disc bulging and mild facet and ligamentum flavum hypertrophy result in mild right neural foraminal stenosis, minimally increased from prior.  L4-5: Prior right hemilaminectomy. Circumferential disc bulging slightly asymmetric to the right is again seen with suggestion of an increased although small right subarticular disc extrusion which may affect the right L5 nerve root in the lateral recess. A componenet of postoperative change in this region is also possible. There is no spinal stenosis. Circumferential disc bulging results in mild to moderate right greater than left neural foraminal stenosis, unchanged.  L5-S1: Prior right hemilaminectomy. Mild circumferential disc bulging results in moderate left neural foraminal stenosis, unchanged. No spinal stenosis.  IMPRESSION: 1. Possible small right subarticular disc extrusion at L4-5 potentially affecting the right L5 nerve root. Some of this appearance could be postoperative in nature. Postcontrast imaging might be helpful for further differentiation. 2. Unchanged mild to  moderate neural foraminal stenosis at L4-5 and moderate left neural foraminal stenosis at L5-S1.   Electronically Signed   By: Logan Bores   On: 07/17/2014 17:43   He reports that he quit smoking about 13 years ago. His smoking use included cigarettes. He has never used smokeless tobacco.  Recent Labs    06/13/17 1157 09/26/17 0833 02/13/18 1030  HGBA1C 8.2* 7.6* 7.4*  LABURIC  --  4.9  --     Objective:  VS:  HT:5\' 7"  (170.2 cm)   WT:160 lb (72.6 kg)  BMI:25.05    BP:(!) 161/84  HR:64bpm  TEMP: ( )  RESP:99 % Physical Exam  Constitutional: He is oriented to person, place, and time. He appears well-developed and well-nourished. No distress.  HENT:  Head: Normocephalic and atraumatic.  Nose: Nose normal.    Mouth/Throat: Oropharynx is clear and moist.  Eyes: Pupils are equal, round, and reactive to light. Conjunctivae are normal.  Neck: Normal range of motion. Neck supple. No tracheal deviation present.  Cardiovascular: Regular rhythm and intact distal pulses.  Pulmonary/Chest: Effort normal and breath sounds normal.  Abdominal: Soft. He exhibits no distension. There is no rebound and no guarding.  Musculoskeletal: He exhibits no edema or deformity.  Patient ambulates without aid with somewhat of a hip rocking gait without foot slapping or circumduction.  On strength exam he has good distal strength except on the right and EHL he has 3 out of 5 strength.  Has more strength with dorsiflexion and EHL.  He has some paresthesia in an L5 distribution on the right compared to left.  He has a positive slump test on the right and negative on the left.  He has no pain with hip rotation and no pain over the greater trochanters.  He does have concordant back pain with extension of the lumbar spine and facet loading.  Neurological: He is alert and oriented to person, place, and time. He exhibits normal muscle tone. Coordination normal.  Skin: Skin is warm. No rash noted.  Psychiatric: He has a normal mood and affect. His behavior is normal.  Nursing note and vitals reviewed.   Ortho Exam Imaging: No results found.  Past Medical/Family/Surgical/Social History: Medications & Allergies reviewed per EMR, new medications updated. Patient Active Problem List   Diagnosis Date Noted  . Vitamin B12 deficiency 02/13/2018  . Lumbar radiculopathy 07/09/2016  . Radiculopathy due to lumbar intervertebral disc disorder 07/09/2016  . Medicare annual wellness visit, subsequent 02/05/2016  . Hemorrhoid 08/07/2015  . BPH without obstruction/lower urinary tract symptoms 02/01/2015  . Essential hypertension 09/12/2014  . Routine general medical examination at a health care facility 01/14/2011  . ERECTILE DYSFUNCTION  04/04/2008  . Hyperlipidemia 06/26/2007  . Diabetes ([place]) 06/25/2007  . NEPHROLITHIASIS, HX OF 06/25/2007   Past Medical History:  Diagnosis Date  . DIABETES MELLITUS, TYPE II   . ERECTILE DYSFUNCTION   . FOOT DROP, RIGHT    s/p ESI x 3 at [place] in 2009 and 2016  . HYPERLIPIDEMIA   . NEPHROLITHIASIS, HX OF    Family History  Problem Relation Age of Onset  . Healthy Mother   . Diabetes Father   . Colon cancer Brother 70   Past Surgical History:  Procedure Laterality Date  . L-spine surgery  [id number]  . LUMBAR EPIDURAL INJECTION  2009, 2006   dr Kemani Heidel   Social History   Occupational History    Comment: retired  Tobacco Use  . Smoking status: Former  Smoker    Types: Cigarettes    Last attempt to quit: 07/09/2004    Years since quitting: 13.8  . Smokeless tobacco: Never Used  Substance and Sexual Activity  . Alcohol use: Yes    Alcohol/week: 3.0 standard drinks    Types: 3 Glasses of wine per week  . Drug use: No  . Sexual activity: Yes    Partners: Female

## 2018-05-29 ENCOUNTER — Encounter (INDEPENDENT_AMBULATORY_CARE_PROVIDER_SITE_OTHER): Payer: Self-pay | Admitting: Physical Medicine and Rehabilitation

## 2018-05-29 ENCOUNTER — Ambulatory Visit (INDEPENDENT_AMBULATORY_CARE_PROVIDER_SITE_OTHER): Payer: Self-pay

## 2018-05-29 ENCOUNTER — Ambulatory Visit (INDEPENDENT_AMBULATORY_CARE_PROVIDER_SITE_OTHER): Payer: Medicare Other | Admitting: Physical Medicine and Rehabilitation

## 2018-05-29 VITALS — BP 143/76 | HR 65 | Temp 97.9°F

## 2018-05-29 DIAGNOSIS — M5416 Radiculopathy, lumbar region: Secondary | ICD-10-CM | POA: Diagnosis not present

## 2018-05-29 DIAGNOSIS — M5116 Intervertebral disc disorders with radiculopathy, lumbar region: Secondary | ICD-10-CM

## 2018-05-29 MED ORDER — BETAMETHASONE SOD PHOS & ACET 6 (3-3) MG/ML IJ SUSP
12.0000 mg | Freq: Once | INTRAMUSCULAR | Status: AC
  Administered 2018-05-29: 12 mg

## 2018-05-29 NOTE — Progress Notes (Signed)
Alex Massey - 77 y.o. male MRN [id number]  Date of birth: Mar 28, 1941  Office Visit Note: Visit Date: 05/29/2018 PCP: Lance Sell, NP Referred by: Lance Sell, NP  Subjective: Chief Complaint  Patient presents with  . Right Leg - Pain  . Right Foot - Pain   HPI:  Alex Massey is a 77 y.o. male who comes in today For planned right L5 transforaminal epidural steroid injection.  Please see our prior evaluation and management note for further details and justification.  Patient has been divided take since I have seen him and they are looking at replacing his older AFO with a smaller one.  Will be happy to sign the paperwork when that comes through.  I think that would be beneficial for him with ambulation without tripping over his foot.  He does have chronic dorsiflexion weakness from prior lumbar surgery.  This is complicated by history of diabetes.  ROS Otherwise per HPI.  Assessment & Plan: Visit Diagnoses:  1. Lumbar radiculopathy   2. Radiculopathy due to lumbar intervertebral disc disorder     Plan: No additional findings.   Meds & Orders:  Meds ordered this encounter  Medications  . betamethasone acetate-betamethasone sodium phosphate (CELESTONE) injection 12 mg    Orders Placed This Encounter  Procedures  . XR C-ARM NO REPORT  . Epidural Steroid injection    Follow-up: Return if symptoms worsen or fail to improve.   Procedures: No procedures performed  Lumbosacral Transforaminal Epidural Steroid Injection - Sub-Pedicular Approach with Fluoroscopic Guidance  Patient: Alex Massey      Date of Birth: 06/19/1941 MRN: [id number] PCP: Lance Sell, NP      Visit Date: 05/29/2018   Universal Protocol:    Date/Time: 05/29/2018  Consent Given By: the patient  Position: PRONE  Additional Comments: Vital signs were monitored before and after the procedure. Patient was prepped and draped in the usual sterile fashion. The  correct patient, procedure, and site was verified.   Injection Procedure Details:  Procedure Site One Meds Administered:  Meds ordered this encounter  Medications  . betamethasone acetate-betamethasone sodium phosphate (CELESTONE) injection 12 mg    Laterality: Right  Location/Site:  L5-S1  Needle size: 22 G  Needle type: Spinal  Needle Placement: Transforaminal  Findings:    -Comments: Excellent flow of contrast along the nerve and into the epidural space.  Procedure Details: After squaring off the end-plates to get a true AP view, the C-arm was positioned so that an oblique view of the foramen as noted above was visualized. The target area is just inferior to the "nose of the scotty dog" or sub pedicular. The soft tissues overlying this structure were infiltrated with 2-3 ml. of 1% Lidocaine without Epinephrine.  The spinal needle was inserted toward the target using a "trajectory" view along the fluoroscope beam.  Under AP and lateral visualization, the needle was advanced so it did not puncture dura and was located close the 6 O'Clock position of the pedical in AP tracterory. Biplanar projections were used to confirm position. Aspiration was confirmed to be negative for CSF and/or blood. A 1-2 ml. volume of Isovue-250 was injected and flow of contrast was noted at each level. Radiographs were obtained for documentation purposes.   After attaining the desired flow of contrast documented above, a 0.5 to 1.0 ml test dose of 0.25% Marcaine was injected into each respective transforaminal space.  The patient was observed for 90 seconds post  injection.  After no sensory deficits were reported, and normal lower extremity motor function was noted,   the above injectate was administered so that equal amounts of the injectate were placed at each foramen (level) into the transforaminal epidural space.   Additional Comments:  The patient tolerated the procedure well Dressing: Band-Aid      Post-procedure details: Patient was observed during the procedure. Post-procedure instructions were reviewed.  Patient left the clinic in stable condition.     Clinical History: MRI LUMBAR SPINE WITHOUT CONTRAST  TECHNIQUE: Multiplanar, multisequence MR imaging of the lumbar spine was performed. No intravenous contrast was administered.  COMPARISON:  08/02/2007  FINDINGS: Straightening of the normal lumbar lordosis is unchanged. There is no significant listhesis. Vertebral body heights are preserved. There is disc desiccation and severe disc space narrowing at L5-S1 with mild narrowing at L4-5. Mild degenerative marrow changes are present at L5-S1. Subcentimeter T1 hypointense focus in the posterior L3 vertebral body is unchanged and likely benign. Conus medullaris is normal in signal and terminates at L1. Paraspinal soft tissues are unremarkable.  L1-2:  Negative.  L2-3:  Mild disc bulging without stenosis, unchanged.  L3-4: Mild disc bulging and mild facet and ligamentum flavum hypertrophy result in mild right neural foraminal stenosis, minimally increased from prior.  L4-5: Prior right hemilaminectomy. Circumferential disc bulging slightly asymmetric to the right is again seen with suggestion of an increased although small right subarticular disc extrusion which may affect the right L5 nerve root in the lateral recess. A componenet of postoperative change in this region is also possible. There is no spinal stenosis. Circumferential disc bulging results in mild to moderate right greater than left neural foraminal stenosis, unchanged.  L5-S1: Prior right hemilaminectomy. Mild circumferential disc bulging results in moderate left neural foraminal stenosis, unchanged. No spinal stenosis.  IMPRESSION: 1. Possible small right subarticular disc extrusion at L4-5 potentially affecting the right L5 nerve root. Some of this appearance could be postoperative in  nature. Postcontrast imaging might be helpful for further differentiation. 2. Unchanged mild to moderate neural foraminal stenosis at L4-5 and moderate left neural foraminal stenosis at L5-S1.   Electronically Signed   By: Logan Bores   On: 07/17/2014 17:43     Objective:  VS:  HT:    WT:   BMI:     BP:(!) 143/76  HR:65bpm  TEMP:97.9 F (36.6 C)(Oral)  RESP:  Physical Exam  Ortho Exam Imaging: Xr C-arm No Report  Result Date: 05/29/2018 Please see Notes tab for imaging impression.

## 2018-05-29 NOTE — Procedures (Signed)
Lumbosacral Transforaminal Epidural Steroid Injection - Sub-Pedicular Approach with Fluoroscopic Guidance  Patient: Alex Massey      Date of Birth: [birth year] MRN: [id number] PCP: Lance Sell, NP      Visit Date: 05/29/2018   Universal Protocol:    Date/Time: 05/29/2018  Consent Given By: the patient  Position: PRONE  Additional Comments: Vital signs were monitored before and after the procedure. Patient was prepped and draped in the usual sterile fashion. The correct patient, procedure, and site was verified.   Injection Procedure Details:  Procedure Site One Meds Administered:  Meds ordered this encounter  Medications  . betamethasone acetate-betamethasone sodium phosphate (CELESTONE) injection 12 mg    Laterality: Right  Location/Site:  L5-S1  Needle size: 22 G  Needle type: Spinal  Needle Placement: Transforaminal  Findings:    -Comments: Excellent flow of contrast along the nerve and into the epidural space.  Procedure Details: After squaring off the end-plates to get a true AP view, the C-arm was positioned so that an oblique view of the foramen as noted above was visualized. The target area is just inferior to the "nose of the scotty dog" or sub pedicular. The soft tissues overlying this structure were infiltrated with 2-3 ml. of 1% Lidocaine without Epinephrine.  The spinal needle was inserted toward the target using a "trajectory" view along the fluoroscope beam.  Under AP and lateral visualization, the needle was advanced so it did not puncture dura and was located close the 6 O'Clock position of the pedical in AP tracterory. Biplanar projections were used to confirm position. Aspiration was confirmed to be negative for CSF and/or blood. A 1-2 ml. volume of Isovue-250 was injected and flow of contrast was noted at each level. Radiographs were obtained for documentation purposes.   After attaining the desired flow of contrast documented above, a  0.5 to 1.0 ml test dose of 0.25% Marcaine was injected into each respective transforaminal space.  The patient was observed for 90 seconds post injection.  After no sensory deficits were reported, and normal lower extremity motor function was noted,   the above injectate was administered so that equal amounts of the injectate were placed at each foramen (level) into the transforaminal epidural space.   Additional Comments:  The patient tolerated the procedure well Dressing: Band-Aid    Post-procedure details: Patient was observed during the procedure. Post-procedure instructions were reviewed.  Patient left the clinic in stable condition.

## 2018-05-29 NOTE — Progress Notes (Signed)
 .  Numeric Pain Rating Scale and Functional Assessment Average Pain 8   In the last MONTH (on 0-10 scale) has pain interfered with the following?  1. General activity like being  able to carry out your everyday physical activities such as walking, climbing stairs, carrying groceries, or moving a chair?  Rating(7)   +Driver, -BT, -Dye Allergies.  

## 2018-05-29 NOTE — Patient Instructions (Signed)

## 2018-06-01 DIAGNOSIS — R972 Elevated prostate specific antigen [PSA]: Secondary | ICD-10-CM | POA: Diagnosis not present

## 2018-06-10 DIAGNOSIS — R972 Elevated prostate specific antigen [PSA]: Secondary | ICD-10-CM | POA: Diagnosis not present

## 2018-06-23 ENCOUNTER — Telehealth: Payer: Self-pay | Admitting: Nurse Practitioner

## 2018-06-23 NOTE — Telephone Encounter (Signed)
Pt dropped off [employer] Patient Assistance Program Enrollment From for completion by provider.  Form  & envelop put in [name]'s box for pick up by CMA.

## 2018-06-24 NOTE — Telephone Encounter (Signed)
Form given to PCP for completion.

## 2018-06-24 NOTE — Telephone Encounter (Signed)
Form, completed, signed and mailed to [employer] patient assistance program. Pt informed. Copy sent to be scanned into chart.

## 2018-07-13 ENCOUNTER — Telehealth: Payer: Self-pay | Admitting: Nurse Practitioner

## 2018-07-13 MED ORDER — BLOOD GLUCOSE MONITOR KIT: 1.0000 | PACK | Freq: Two times a day (BID) | 0 refills | Status: AC

## 2018-07-13 NOTE — Telephone Encounter (Signed)
Copied from [place] [phone]. Topic: Quick Communication - See Telephone Encounter >> Jul 13, 2018  8:32 AM Rayann Heman wrote: Pt called and stated that his blood sugar meter is not working anymore and would like an ok to get a new one. Please advise

## 2018-07-13 NOTE — Telephone Encounter (Signed)
Called pt- no answer  to see if he is wanting a prescription for a new glucometer sent in or a sample meter. If we do a sample meter, we will have to send in new scripts for strips and lancets.  CRM created.

## 2018-07-13 NOTE — Telephone Encounter (Signed)
Pt called back to state he is requesting a New glucometer to be sent to the pharmacy.   Preferred Pharmacy: [facility] [address], [place] - [address] [phone] (Phone) [phone] (Fax)

## 2018-07-14 DIAGNOSIS — E119 Type 2 diabetes mellitus without complications: Secondary | ICD-10-CM | POA: Diagnosis not present

## 2018-07-14 DIAGNOSIS — M21371 Foot drop, right foot: Secondary | ICD-10-CM | POA: Diagnosis not present

## 2018-07-14 NOTE — Telephone Encounter (Signed)
Patient called back regarding the Glucometer that he want he stated that its the Free Style but he was informed that an Rx was faxed to the pharmacy this a.m. 07/14/2018

## 2018-07-14 NOTE — Telephone Encounter (Signed)
Printed rx signed and faxed to pharmacy. See meds.

## 2018-08-04 DIAGNOSIS — R972 Elevated prostate specific antigen [PSA]: Secondary | ICD-10-CM | POA: Diagnosis not present

## 2018-08-08 ENCOUNTER — Other Ambulatory Visit: Payer: Self-pay | Admitting: Nurse Practitioner

## 2018-08-11 ENCOUNTER — Other Ambulatory Visit: Payer: Self-pay | Admitting: Nurse Practitioner

## 2018-08-11 ENCOUNTER — Other Ambulatory Visit (HOSPITAL_COMMUNITY): Payer: Self-pay | Admitting: Urology

## 2018-08-11 DIAGNOSIS — E118 Type 2 diabetes mellitus with unspecified complications: Secondary | ICD-10-CM

## 2018-08-11 DIAGNOSIS — C61 Malignant neoplasm of prostate: Secondary | ICD-10-CM

## 2018-08-14 DIAGNOSIS — C61 Malignant neoplasm of prostate: Secondary | ICD-10-CM | POA: Diagnosis not present

## 2018-08-17 ENCOUNTER — Ambulatory Visit: Payer: Medicare Other | Admitting: Nurse Practitioner

## 2018-08-17 ENCOUNTER — Encounter: Payer: Self-pay | Admitting: Nurse Practitioner

## 2018-08-17 VITALS — BP 134/84 | HR 72 | Temp 97.8°F | Resp 16 | Ht 67.0 in | Wt 164.0 lb

## 2018-08-17 DIAGNOSIS — L309 Dermatitis, unspecified: Secondary | ICD-10-CM | POA: Diagnosis not present

## 2018-08-17 DIAGNOSIS — E119 Type 2 diabetes mellitus without complications: Secondary | ICD-10-CM

## 2018-08-17 LAB — POCT GLYCOSYLATED HEMOGLOBIN (HGB A1C): HEMOGLOBIN A1C: 7.9 % — AB (ref 4.0–5.6)

## 2018-08-17 MED ORDER — TRIAMCINOLONE ACETONIDE 0.1 % EX CREA: 1.0000 "application " | TOPICAL_CREAM | Freq: Two times a day (BID) | CUTANEOUS | 0 refills | Status: DC

## 2018-08-17 NOTE — Patient Instructions (Addendum)
I have sent a prescription for triamcinolone to your finger as we discussed. Decrease washing hands and wear gloves when washing dishes Please follow up if symptoms worsen or persist   Mediterranean Diet A Mediterranean diet refers to food and lifestyle choices that are based on the traditions of countries located on the [employer]. This way of eating has been shown to help prevent certain conditions and improve outcomes for people who have chronic diseases, like kidney disease and heart disease. What are tips for following this plan? Lifestyle  Cook and eat meals together with your family, when possible.  Drink enough fluid to keep your urine clear or pale yellow.  Be physically active every day. This includes: ? Aerobic exercise like running or swimming. ? Leisure activities like gardening, walking, or housework.  Get 7-8 hours of sleep each night.  If recommended by your health care provider, drink red wine in moderation. This means 1 glass a day for nonpregnant women and 2 glasses a day for men. A glass of wine equals 5 oz (150 mL). Reading food labels   Check the serving size of packaged foods. For foods such as rice and pasta, the serving size refers to the amount of cooked product, not dry.  Check the total fat in packaged foods. Avoid foods that have saturated fat or trans fats.  Check the ingredients list for added sugars, such as corn syrup. Shopping  At the grocery store, buy most of your food from the areas near the walls of the store. This includes: ? Fresh fruits and vegetables (produce). ? Grains, beans, nuts, and seeds. Some of these may be available in unpackaged forms or large amounts (in bulk). ? Fresh seafood. ? Poultry and eggs. ? Low-fat dairy products.  Buy whole ingredients instead of prepackaged foods.  Buy fresh fruits and vegetables in-season from local farmers markets.  Buy frozen fruits and vegetables in resealable bags.  If you do not  have access to quality fresh seafood, buy precooked frozen shrimp or canned fish, such as tuna, salmon, or sardines.  Buy small amounts of raw or cooked vegetables, salads, or olives from the deli or salad bar at your store.  Stock your pantry so you always have certain foods on hand, such as olive oil, canned tuna, canned tomatoes, rice, pasta, and beans. Cooking  Cook foods with extra-virgin olive oil instead of using butter or other vegetable oils.  Have meat as a side dish, and have vegetables or grains as your main dish. This means having meat in small portions or adding small amounts of meat to foods like pasta or stew.  Use beans or vegetables instead of meat in common dishes like chili or lasagna.  Experiment with different cooking methods. Try roasting or broiling vegetables instead of steaming or sauteing them.  Add frozen vegetables to soups, stews, pasta, or rice.  Add nuts or seeds for added healthy fat at each meal. You can add these to yogurt, salads, or vegetable dishes.  Marinate fish or vegetables using olive oil, lemon juice, garlic, and fresh herbs. Meal planning   Plan to eat 1 vegetarian meal one day each week. Try to work up to 2 vegetarian meals, if possible.  Eat seafood 2 or more times a week.  Have healthy snacks readily available, such as: ? Vegetable sticks with hummus. ? [place] yogurt. ? Fruit and nut trail mix.  Eat balanced meals throughout the week. This includes: ? Fruit: 2-3 servings a day ?  Vegetables: 4-5 servings a day ? Low-fat dairy: 2 servings a day ? Fish, poultry, or lean meat: 1 serving a day ? Beans and legumes: 2 or more servings a week ? Nuts and seeds: 1-2 servings a day ? Whole grains: 6-8 servings a day ? Extra-virgin olive oil: 3-4 servings a day  Limit red meat and sweets to only a few servings a month What are my food choices?  Mediterranean diet ? Recommended ? Grains: Whole-grain pasta. Brown rice. Bulgar wheat.  Polenta. Couscous. Whole-wheat bread. [place]. ? Vegetables: Artichokes. Beets. Broccoli. Cabbage. Carrots. Eggplant. Green beans. Chard. Kale. Spinach. Onions. Leeks. Peas. Squash. Tomatoes. Peppers. Radishes. ? Fruits: Apples. Apricots. Avocado. Berries. Bananas. Cherries. Dates. Figs. Grapes. Lemons. Melon. Oranges. Peaches. Plums. Pomegranate. ? Meats and other protein foods: Beans. Almonds. Sunflower seeds. Pine nuts. Peanuts. [place]. Salmon. Scallops. Shrimp. [place]. Tilapia. Clams. Oysters. Eggs. ? Dairy: Low-fat milk. Cheese. Greek yogurt. ? Beverages: Water. Red wine. Herbal tea. ? Fats and oils: Extra virgin olive oil. Avocado oil. Grape seed oil. ? Sweets and desserts: [place] yogurt with honey. Baked apples. Poached pears. Trail mix. ? Seasoning and other foods: Basil. Cilantro. Coriander. Cumin. Mint. Parsley. Sage. Rosemary. Tarragon. Garlic. Oregano. Thyme. Pepper. Balsalmic vinegar. Tahini. Hummus. Tomato sauce. Olives. Mushrooms. ? Limit these ? Grains: Prepackaged pasta or rice dishes. Prepackaged cereal with added sugar. ? Vegetables: Deep fried potatoes (french fries). ? Fruits: Fruit canned in syrup. ? Meats and other protein foods: Beef. Pork. Lamb. Poultry with skin. Hot dogs. [place] Sal[place]. ? Dairy: Ice cream. Sour cream. Whole milk. ? Beverages: Juice. Sugar-sweetened soft drinks. Beer. Liquor and spirits. ? Fats and oils: Butter. Canola oil. Vegetable oil. Beef fat (tallow). Lard. ? Sweets and desserts: Cookies. Cakes. Pies. Candy. ? Seasoning and other foods: Mayonnaise. Premade sauces and marinades. ? The items listed may not be a complete list. Talk with your dietitian about what dietary choices are right for you. Summary  The Mediterranean diet includes both food and lifestyle choices.  Eat a variety of fresh fruits and vegetables, beans, nuts, seeds, and whole grains.  Limit the amount of red meat and sweets that you eat.  Talk with your health care provider about  whether it is safe for you to drink red wine in moderation. This means 1 glass a day for nonpregnant women and 2 glasses a day for men. A glass of wine equals 5 oz (150 mL). This information is not intended to replace advice given to you by your health care provider. Make sure you discuss any questions you have with your health care provider. Document Released: 02/15/2016 Document Revised: 03/19/2016 Document Reviewed: 02/15/2016 Elsevier Interactive Patient Education  2019 [employer].

## 2018-08-17 NOTE — Progress Notes (Signed)
Alex Massey is a 78 y.o. male with the following history as recorded in [facility]:  Patient Active Problem List   Diagnosis Date Noted  . Vitamin B12 deficiency 02/13/2018  . Lumbar radiculopathy 07/09/2016  . Radiculopathy due to lumbar intervertebral disc disorder 07/09/2016  . Medicare annual wellness visit, subsequent 02/05/2016  . Hemorrhoid 08/07/2015  . BPH without obstruction/lower urinary tract symptoms 02/01/2015  . Essential hypertension 09/12/2014  . Routine general medical examination at a health care facility 01/14/2011  . ERECTILE DYSFUNCTION 04/04/2008  . Hyperlipidemia 06/26/2007  . Diabetes ([place]) 06/25/2007  . NEPHROLITHIASIS, HX OF 06/25/2007    Current Outpatient Medications  Medication Sig Dispense Refill  . acarbose (PRECOSE) 50 MG tablet TAKE 1 TABLET BY MOUTH THREE TIMES DAILY WITH MEALS 270 tablet 0  . aspirin EC 81 MG tablet Take 1 tablet (81 mg total) by mouth daily. 150 tablet 2  . blood glucose meter kit and supplies KIT Inject 1 each into the skin 2 (two) times daily. Dispense based on patient and insurance preference. Use up to four times daily as directed. (FOR ICD-9 250.00, 250.01). 1 each 0  . gabapentin (NEURONTIN) 300 MG capsule Take 1 capsule (300 mg total) by mouth at bedtime. 90 capsule 1  . glimepiride (AMARYL) 2 MG tablet TAKE 1 TABLET(2 MG) BY MOUTH DAILY WITH BREAKFAST 90 tablet 1  . glucose blood (FREESTYLE TEST STRIPS) test strip Use to check blood sugars twice a day Dx CODE: E11.9 150 each 5  . Lancets (FREESTYLE) lancets Use to check blood sugars two times daily. Dx Code: E11.9 200 each 3  . losartan (COZAAR) 50 MG tablet TAKE 1 TABLET BY MOUTH DAILY 90 tablet 1  . simvastatin (ZOCOR) 20 MG tablet TAKE 1 TABLET(20 MG) BY MOUTH AT BEDTIME 90 tablet 1  . sitaGLIPtin (JANUVIA) 100 MG tablet Take 1 tablet (100 mg total) by mouth daily. 14 tablet 0  . vitamin B-12 (CYANOCOBALAMIN) 1000 MCG tablet Take 1,000 mcg by mouth daily.    .  traMADol (ULTRAM) 50 MG tablet Take 1 tablet (50 mg total) by mouth every 8 (eight) hours as needed. (Patient not taking: Reported on 08/17/2018) 20 tablet 0  . triamcinolone cream (KENALOG) 0.1 % Apply 1 application topically 2 (two) times daily. 80 g 0   Current Facility-Administered Medications  Medication Dose Route Frequency Provider Last Rate Last Dose  . lidocaine (PF) (XYLOCAINE) 1 % injection 0.3 mL  0.3 mL Other Once Magnus Sinning, MD        Allergies: Metformin and related  Past Medical History:  Diagnosis Date  . DIABETES MELLITUS, TYPE II   . ERECTILE DYSFUNCTION   . FOOT DROP, RIGHT    s/p ESI x 3 at [facility] in 2009 and 2016  . HYPERLIPIDEMIA   . NEPHROLITHIASIS, HX OF     Past Surgical History:  Procedure Laterality Date  . L-spine surgery  [id number]  . LUMBAR EPIDURAL INJECTION  2009, 2006   dr newton    Family History  Problem Relation Age of Onset  . Healthy Mother   . Diabetes Father   . Colon cancer Brother 44    Social History   Tobacco Use  . Smoking status: Former Smoker    Types: Cigarettes    Last attempt to quit: 07/09/2004    Years since quitting: 14.1  . Smokeless tobacco: Never Used  Substance Use Topics  . Alcohol use: Yes    Alcohol/week: 3.0 standard drinks  Types: 3 Glasses of wine per week     Subjective:  Alex Massey is very pleasant 78yo M, here today for routine follow up of Diabetes- maintained on acarbose 50 TID, amaryl 2 daily, januvia 100 daily Reports daily medication compliance without adverse medication effects. He does check glucose readings at home regularly, recent readings around 150s. Denies hypoglycemia, tremor, diaphoresis, polyuria, polydipsia, polyphagia. He tells me that he has not been exercising as much due to cold weather, also not watching diet as much over past few months Last A1c 02/13/18- 7.4  He is also requesting evaluation of scaly dry patch of skin to distal tip of left middle finger, around nail  bed, noticed when waking one morning this week, denies any injuries, pain, drainage, numbness. He does not recall injury to the finger, does garden and wash dishes frequently  ROS- See HPI   Objective:  Vitals:   08/17/18 0757  BP: 134/84  Pulse: 72  Resp: 16  Temp: 97.8 F (36.6 C)  TempSrc: Oral  SpO2: 98%  Weight: 164 lb (74.4 kg)  Height: _0  (1.702 m)    General: Well developed, well nourished, in no acute distress  Skin : Warm and dry. Dry scaly skin to distal tip of left middle finger. Head: Normocephalic and atraumatic  Eyes: Sclera and conjunctiva clear; pupils round and reactive to light; extraocular movements intact  Oropharynx: Pink, supple. No suspicious lesions  Neck: Supple Lungs: Respirations unlabored; clear to auscultation bilaterally without wheeze, rales, rhonchi  CVS exam: normal rate, regular rhythm, normal S1, S2, no murmurs, rubs, clicks or gallops.  Extremities: No edema, cyanosis, clubbing  Vessels: Symmetric bilaterally  Neurologic: Alert and oriented; speech intact; face symmetrical; moves all extremities well; CNII-XII intact without focal deficit  Psychiatric: Normal mood and affect.  Assessment:  1. Type 2 diabetes mellitus without complication, without long-term current use of insulin ([place])   2. Dermatitis     Plan:   Dermatitis Likely due to cold/dry weather and overuse of water, washing hands which we discussed triamcinolone Rx sent- med dosing, side effects discussed Home management, red flags and return precautions including when to seek immediate care discussed and printed on AVS He will f/u for new, worsening, persistent symptoms   Return in about 3 months (around 11/15/2018) for DM F/U:-recheck a1c, consider med adjustments.  Orders Placed This Encounter  Procedures  . POCT HgB A1C    Requested Prescriptions   Signed Prescriptions Disp Refills  . triamcinolone cream (KENALOG) 0.1 % 80 g 0    Sig: Apply 1 application topically  2 (two) times daily.

## 2018-08-17 NOTE — Assessment & Plan Note (Addendum)
A1c is up today, due to age we can let his A1c go a little higher than 7 but would like to keep him less than/closer to 7.5 which we discussed, we also discussed  the role of healthy diet and exercise in the management of DM and printed additional information on AVS he will continue current medications for now -He is going to work on diet and exercise for 3 months and return for repeat A1c, we will consider medication adjustments if A1c remains elevated at next check - POCT HgB A1C-7.9

## 2018-08-18 ENCOUNTER — Encounter (HOSPITAL_COMMUNITY)
Admission: RE | Admit: 2018-08-18 | Discharge: 2018-08-18 | Disposition: A | Discharge status: Home / Self Care | Payer: Medicare Other | Source: Ambulatory Visit | Attending: Urology | Admitting: Urology

## 2018-08-18 DIAGNOSIS — C61 Malignant neoplasm of prostate: Secondary | ICD-10-CM | POA: Insufficient documentation

## 2018-08-18 DIAGNOSIS — Z8546 Personal history of malignant neoplasm of prostate: Secondary | ICD-10-CM | POA: Diagnosis not present

## 2018-08-18 MED ORDER — TECHNETIUM TC 99M MEDRONATE IV KIT
21.6000 | PACK | Freq: Once | INTRAVENOUS | Status: AC | PRN
  Administered 2018-08-18: 21.6 via INTRAVENOUS

## 2018-08-26 DIAGNOSIS — R3912 Poor urinary stream: Secondary | ICD-10-CM | POA: Diagnosis not present

## 2018-08-26 DIAGNOSIS — R350 Frequency of micturition: Secondary | ICD-10-CM | POA: Diagnosis not present

## 2018-08-26 DIAGNOSIS — N401 Enlarged prostate with lower urinary tract symptoms: Secondary | ICD-10-CM | POA: Diagnosis not present

## 2018-08-26 DIAGNOSIS — C61 Malignant neoplasm of prostate: Secondary | ICD-10-CM | POA: Diagnosis not present

## 2018-08-31 DIAGNOSIS — N281 Cyst of kidney, acquired: Secondary | ICD-10-CM | POA: Diagnosis not present

## 2018-09-02 DIAGNOSIS — Z5111 Encounter for antineoplastic chemotherapy: Secondary | ICD-10-CM | POA: Diagnosis not present

## 2018-09-02 DIAGNOSIS — C61 Malignant neoplasm of prostate: Secondary | ICD-10-CM | POA: Diagnosis not present

## 2018-09-03 ENCOUNTER — Encounter: Payer: Self-pay | Admitting: Radiation Oncology

## 2018-09-03 DIAGNOSIS — C61 Malignant neoplasm of prostate: Secondary | ICD-10-CM | POA: Insufficient documentation

## 2018-09-03 NOTE — Progress Notes (Signed)
GU Location of Tumor / Histology: prostatic adenocarcinoma  If Prostate Cancer, Gleason Score is (4 + 4) and PSA is (7.05) on 06/01/2018. Prostate volume: 26 grams.   Alex Massey has been followed by Dr. Junious Silk since 2016 for ED, weak stream and BPH.   Biopsies of prostate (if applicable) revealed:    Past/Anticipated interventions by urology, if any: prostate biopsy, tamsulosin, sildenafil, ultrasound, CT abd/pelvis (negative), PET (negative), Lupron ???, referral for consideration of radiation therapy.  Past/Anticipated interventions by medical oncology, if any: no  Weight changes, if any: denies  Bowel/Bladder complaints, if any: Endorses steadyweak stream and frequency. IPSS 10. SHIM 1.   Nausea/Vomiting, if any: denies  Pain issues, if any:  denies  SAFETY ISSUES:  Prior radiation? no  Pacemaker/ICD? no  Possible current pregnancy? no, male patient  Is the patient on methotrexate? no  Current Complaints / other details:  78 year old male. Married. Brother with hx of prostate ca. Enjoys walking, remaining activity, and gardening.

## 2018-09-04 ENCOUNTER — Encounter: Payer: Self-pay | Admitting: Radiation Oncology

## 2018-09-04 ENCOUNTER — Other Ambulatory Visit: Payer: Self-pay

## 2018-09-04 ENCOUNTER — Ambulatory Visit
Admission: RE | Admit: 2018-09-04 | Discharge: 2018-09-04 | Disposition: A | Discharge status: Home / Self Care | Payer: Medicare Other | Source: Ambulatory Visit | Attending: Radiation Oncology | Admitting: Radiation Oncology

## 2018-09-04 DIAGNOSIS — E119 Type 2 diabetes mellitus without complications: Secondary | ICD-10-CM | POA: Insufficient documentation

## 2018-09-04 DIAGNOSIS — Z8042 Family history of malignant neoplasm of prostate: Secondary | ICD-10-CM | POA: Insufficient documentation

## 2018-09-04 DIAGNOSIS — Z87442 Personal history of urinary calculi: Secondary | ICD-10-CM | POA: Diagnosis not present

## 2018-09-04 DIAGNOSIS — M21371 Foot drop, right foot: Secondary | ICD-10-CM | POA: Insufficient documentation

## 2018-09-04 DIAGNOSIS — Z800 Family history of malignant neoplasm of digestive organs: Secondary | ICD-10-CM | POA: Diagnosis not present

## 2018-09-04 DIAGNOSIS — Z79818 Long term (current) use of other agents affecting estrogen receptors and estrogen levels: Secondary | ICD-10-CM | POA: Diagnosis not present

## 2018-09-04 DIAGNOSIS — Z79899 Other long term (current) drug therapy: Secondary | ICD-10-CM | POA: Insufficient documentation

## 2018-09-04 DIAGNOSIS — E785 Hyperlipidemia, unspecified: Secondary | ICD-10-CM | POA: Diagnosis not present

## 2018-09-04 DIAGNOSIS — C61 Malignant neoplasm of prostate: Secondary | ICD-10-CM | POA: Diagnosis not present

## 2018-09-04 DIAGNOSIS — Z7982 Long term (current) use of aspirin: Secondary | ICD-10-CM | POA: Diagnosis not present

## 2018-09-04 DIAGNOSIS — R9721 Rising PSA following treatment for malignant neoplasm of prostate: Secondary | ICD-10-CM | POA: Diagnosis not present

## 2018-09-04 HISTORY — DX: Malignant neoplasm of prostate: C61

## 2018-09-04 NOTE — Progress Notes (Signed)
See progress note under physician encounter. 

## 2018-09-04 NOTE — Progress Notes (Signed)
Radiation Oncology         [phone] ________________________________  Initial outpatient Consultation  Name: Alex Massey MRN: [id number]  Date: 09/04/2018  DOB: 1941/02/01  KC:[id number], Delphia Grates, NP  Festus Aloe, MD   REFERRING PHYSICIAN: Festus Aloe, MD  DIAGNOSIS: 78 y.o. gentleman with Stage T1c adenocarcinoma of the prostate with Gleason score of 4+4, and PSA of 7.05.    ICD-10-CM   1. Malignant neoplasm of prostate ([place]) C61     HISTORY OF PRESENT ILLNESS: Alex Massey is a 78 y.o. male with a diagnosis of prostate cancer. He is an established patient of Dr. Junious Silk, followed for [facility] and ED since 2016. He was noted to have an elevated PSA of 4.41 in July 2017, which stabilized until it went up to 6.96 in June 2019, 5.34 in July 2019, 10.3 in October 2019, and 7.05 in November 2019. He had a follow up visit with Dr. Junious Silk on 06/10/2018, with a normal digital rectal examination.  The patient proceeded to transrectal ultrasound with 12 biopsies of the prostate on 08/04/2018.  The prostate volume measured 26 cc.  Out of 12 core biopsies, 9 were positive.  The maximum Gleason score was 4+4, and this was seen in right apex. Gleason 4+3 was seen in left apex. Gleason 3+4 was seen in left base lateral, left mid lateral, left apex lateral, left base, left mid, right base, and right mid. Perineural invasion identified in all 6 left cores.  He underwent CT abdomen/pelvis and bone scan on 08/18/2018 for disease staging and these exams were negative for any evidence of visceral or osseous metastasis.  He was started on Lupron 45 mg on 08/31/18 which he is tolerating very well.  He denies hot flashes or significant fatigue.  He has continued to walk for exercise daily which he enjoys.  The patient reviewed the biopsy results with his urologist and he has kindly been referred today for discussion of potential radiation treatment options.   PREVIOUS RADIATION THERAPY:  No  PAST MEDICAL HISTORY:  Past Medical History:  Diagnosis Date  . DIABETES MELLITUS, TYPE II   . ERECTILE DYSFUNCTION   . FOOT DROP, RIGHT    s/p ESI x 3 at [place] in 2009 and 2016  . HYPERLIPIDEMIA   . NEPHROLITHIASIS, HX OF   . Prostate cancer ([place])       PAST SURGICAL HISTORY: Past Surgical History:  Procedure Laterality Date  . L-spine surgery  [id number]  . LUMBAR EPIDURAL INJECTION  2009, 2006   dr newton    FAMILY HISTORY:  Family History  Problem Relation Age of Onset  . Healthy Mother   . Prostate cancer Brother   . Diabetes Father   . Colon cancer Brother 12  . Prostate cancer Cousin        maternal  . Prostate cancer Nephew     SOCIAL HISTORY:  Social History   Socioeconomic History  . Marital status: Married    Spouse name: Not on file  . Number of children: 2  . Years of education: 34  . Highest education level: Not on file  Occupational History    Comment: retired  Scientific laboratory technician  . Financial resource strain: Not on file  . Food insecurity:    Worry: Not on file    Inability: Not on file  . Transportation needs:    Medical: Not on file    Non-medical: Not on file  Tobacco Use  . Smoking status:  Never Smoker  . Smokeless tobacco: Never Used  Substance and Sexual Activity  . Alcohol use: Yes    Alcohol/week: 3.0 standard drinks    Types: 3 Glasses of wine per week  . Drug use: No  . Sexual activity: Not Currently    Partners: Female  Lifestyle  . Physical activity:    Days per week: Not on file    Minutes per session: Not on file  . Stress: Not on file  Relationships  . Social connections:    Talks on phone: Not on file    Gets together: Not on file    Attends religious service: Not on file    Active member of club or organization: Not on file    Attends meetings of clubs or organizations: Not on file    Relationship status: Not on file  . Intimate partner violence:    Fear of current or ex partner: Not on file     Emotionally abused: Not on file    Physically abused: Not on file    Forced sexual activity: Not on file  Other Topics Concern  . Not on file  Social History Narrative   HSG 2 years trade school. married 1965. 2 daughters- '66, '71. 2 grandchldren. 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    ALLERGIES: Metformin and related  MEDICATIONS:  Current Outpatient Medications  Medication Sig Dispense Refill  . acarbose (PRECOSE) 50 MG tablet TAKE 1 TABLET BY MOUTH THREE TIMES DAILY WITH MEALS 270 tablet 0  . aspirin EC 81 MG tablet Take 1 tablet (81 mg total) by mouth daily. 150 tablet 2  . blood glucose meter kit and supplies KIT Inject 1 each into the skin 2 (two) times daily. Dispense based on patient and insurance preference. Use up to four times daily as directed. (FOR ICD-9 250.00, 250.01). 1 each 0  . Blood Glucose Monitoring Suppl (FREESTYLE LITE) DEVI USE TO CHECK GLUCOSE UP TO 4 TIMES DAILY AS DIRECTED    . gabapentin (NEURONTIN) 300 MG capsule Take 1 capsule (300 mg total) by mouth at bedtime. 90 capsule 1  . glimepiride (AMARYL) 2 MG tablet TAKE 1 TABLET(2 MG) BY MOUTH DAILY WITH BREAKFAST 90 tablet 1  . glucose blood (FREESTYLE TEST STRIPS) test strip Use to check blood sugars twice a day Dx CODE: E11.9 150 each 5  . Lancets (FREESTYLE) lancets Use to check blood sugars two times daily. Dx Code: E11.9 200 each 3  . losartan (COZAAR) 50 MG tablet TAKE 1 TABLET BY MOUTH DAILY 90 tablet 1  . simvastatin (ZOCOR) 20 MG tablet TAKE 1 TABLET(20 MG) BY MOUTH AT BEDTIME 90 tablet 1  . sitaGLIPtin (JANUVIA) 100 MG tablet Take 1 tablet (100 mg total) by mouth daily. 14 tablet 0  . tamsulosin (FLOMAX) 0.4 MG CAPS capsule TK 1 C PO HS    . triamcinolone cream (KENALOG) 0.1 % Apply 1 application topically 2 (two) times daily. 80 g 0  . vitamin B-12 (CYANOCOBALAMIN) 1000 MCG tablet Take 1,000 mcg by mouth daily.     Current Facility-Administered Medications  Medication  Dose Route Frequency Provider Last Rate Last Dose  . lidocaine (PF) (XYLOCAINE) 1 % injection 0.3 mL  0.3 mL Other Once Magnus Sinning, MD        REVIEW OF SYSTEMS:  On review of systems, the patient reports that he is doing well overall. He denies any chest pain, shortness of breath, cough, fevers, chills, night sweats, unintended  weight changes. He denies any bowel disturbances, and denies abdominal pain, nausea or vomiting. He denies any new musculoskeletal or joint aches or pains. His IPSS was 10, indicating moderate urinary symptoms with a weak but steady stream and daytime frequency. He states Flomax has helped. He also denies any ill side effects since starting Lupron. His SHIM was 1, indicating he has severe erectile dysfunction which has been refractory to oral PDE-5 inhibitors.  He is not interested in considering injectable ED treatments.  A complete review of systems is obtained and is otherwise negative.  PHYSICAL EXAM:  Wt Readings from Last 3 Encounters:  09/04/18 165 lb (74.8 kg)  08/17/18 164 lb (74.4 kg)  05/20/18 160 lb (72.6 kg)   Temp Readings from Last 3 Encounters:  09/04/18 98.2 F (36.8 C) (Oral)  08/17/18 97.8 F (36.6 C) (Oral)  05/29/18 97.9 F (36.6 C) (Oral)   BP Readings from Last 3 Encounters:  09/04/18 (!) 165/93  08/17/18 134/84  05/29/18 (!) 143/76   Pulse Readings from Last 3 Encounters:  09/04/18 91  08/17/18 72  05/29/18 65   Pain Assessment Pain Score: 0-No pain/10  In general this is a well appearing African American gentleman in no acute distress. He is alert and oriented x4 and appropriate throughout the examination. HEENT reveals that the patient is normocephalic, atraumatic. EOMs are intact. PERRLA. Skin is intact without any evidence of gross lesions. Cardiovascular exam reveals a regular rate and rhythm, no clicks rubs or murmurs are auscultated. Chest is clear to auscultation bilaterally. Lymphatic assessment is performed and does not  reveal any adenopathy in the cervical, supraclavicular, axillary, or inguinal chains. Abdomen has active bowel sounds in all quadrants and is intact. The abdomen is soft, non tender, non distended. Lower extremities are negative for pretibial pitting edema, deep calf tenderness, cyanosis or clubbing.   KPS = 100  100 - Normal; no complaints; no evidence of disease. 90   - Able to carry on normal activity; minor signs or symptoms of disease. 80   - Normal activity with effort; some signs or symptoms of disease. 22   - Cares for self; unable to carry on normal activity or to do active work. 60   - Requires occasional assistance, but is able to care for most of his personal needs. 50   - Requires considerable assistance and frequent medical care. 85   - Disabled; requires special care and assistance. 63   - Severely disabled; hospital admission is indicated although death not imminent. 56   - Very sick; hospital admission necessary; active supportive treatment necessary. 10   - Moribund; fatal processes progressing rapidly. 0     - Dead  Karnofsky DA, Abelmann Marion, Craver LS and Burchenal [facility] [phone]) The use of the nitrogen mustards in the palliative treatment of carcinoma: with particular reference to bronchogenic carcinoma Cancer 1 [phone]  LABORATORY DATA:  Lab Results  Component Value Date   WBC 5.3 03/23/2018   HGB 13.2 03/23/2018   HCT 39.8 03/23/2018   MCV 79.1 03/23/2018   PLT 131.0 (L) 03/23/2018   Lab Results  Component Value Date   NA 141 02/13/2018   K 4.4 02/13/2018   CL 105 02/13/2018   CO2 28 02/13/2018   Lab Results  Component Value Date   ALT 18 02/13/2018   AST 14 02/13/2018   ALKPHOS 70 02/13/2018   BILITOT 0.8 02/13/2018     RADIOGRAPHY: Nm Bone Scan Whole Body  Result Date: 08/19/2018  CLINICAL DATA:  History of prostate cancer EXAM: NUCLEAR MEDICINE WHOLE BODY BONE SCAN TECHNIQUE: Whole body anterior and posterior images were obtained approximately 3 hours  after intravenous injection of radiopharmaceutical. RADIOPHARMACEUTICALS:  21.6 mCi Technetium-37mMDP IV COMPARISON:  None. FINDINGS: Abnormal uptake is noted medially involving the left knee most likely representing degenerative change. No other areas of abnormal uptake are noted. IMPRESSION: No definite scintigraphic evidence of osseous metastases. Electronically Signed   By: JMarijo Conception M.D.   On: 08/19/2018 08:47      IMPRESSION/PLAN: 1. 78y.o. gentleman with Stage T1c adenocarcinoma of the prostate with Gleason Score of 4+4, and PSA of 7.05. We discussed the patient's workup and outlined the nature of prostate cancer in this setting. The patient's T stage, Gleason's score, and PSA put him into the high risk group. Accordingly, he is eligible for a variety of potential treatment options including prostatectomy or LT-ADT in combination with either 8 weeks of external radiation or 5 weeks of external radiation preceded by a brachytherapy boost. We discussed the available radiation techniques, and focused on the details and logistics and delivery. We discussed and outlined the risks, benefits, short and long-term effects associated with radiotherapy and compared and contrasted these with prostatectomy. We discussed the role of SpaceOAR in reducing the rectal toxicity associated with radiotherapy. He received his first Lupron injection on 08/31/2018.  At the end of the conversation the patient is interested in moving forward with 5 week course of daily radiotherapy proceeded by brachytherapy boost and use of SpaceOAR to reduce rectal toxicity from radiotherapy. We will share our discussion with Dr. EJunious Silkand move forward with scheduling [facility] in [facility] near future.  We would anticipate beginning treatment with brachytherapy boost around April 20th which will be approximately 8 weeks from the start of ADT. The patient met briefly with SRomie Jumperin our office who will be  working closely with him to coordinate OR scheduling and pre and post procedure appointments.  We will contact the pharmaceutical rep to ensure [place] at the time of procedure.  He will have a prostate MRI following his post-seed CT SIM to confirm appropriate distribution of the S[place]raw[place]  We spent 60 minutes face to face with the patient and more than 50% of that time was spent in counseling and/or coordination of care.    ANicholos Johns PA-C    MTyler Pita MD  [facility] Direct Dial: [phone] Fax: [phone]  Skype  LinkedIn   This document serves as a record of services personally performed by MTyler Pita MD and AFreeman Caldron PA-C. It was created on their behalf by KWilburn Mylar a trained medical scribe. The creation of this record is based on the scribe's personal observations and the provider's statements to them. This document has been checked and approved by the attending provider.

## 2018-09-07 ENCOUNTER — Encounter: Payer: Self-pay | Admitting: Medical Oncology

## 2018-09-09 ENCOUNTER — Telehealth: Payer: Self-pay | Admitting: *Deleted

## 2018-09-09 NOTE — Telephone Encounter (Signed)
CALLED PATIENT TO INFORM OF PRE-SEED PLANNING CT FOR 09-17-18, SPOKE WITH PATIENT AND HE IS AWARE OF THIS APPT.

## 2018-09-16 ENCOUNTER — Telehealth: Payer: Self-pay | Admitting: *Deleted

## 2018-09-16 NOTE — Progress Notes (Signed)
  Radiation Oncology         [phone] ________________________________  Name: Alex Massey MRN: [id number]  Date: 09/17/2018  DOB: [birth year]  SIMULATION AND TREATMENT PLANNING NOTE PUBIC ARCH STUDY  [id number], Delphia Grates, NP  Lance Sell, NP  DIAGNOSIS: 78 y.o. gentleman with Stage T1c adenocarcinoma of the prostate with Gleason score of 4+4, and PSA of 7.05     ICD-10-CM   1. Malignant neoplasm of prostate ([place]) C61     COMPLEX SIMULATION:  The patient presented today for evaluation for possible prostate seed implant. He was brought to the radiation planning suite and placed supine on the CT couch. A 3-dimensional image study set was obtained in upload to the planning computer. There, on each axial slice, I contoured the prostate gland. Then, using three-dimensional radiation planning tools I reconstructed the prostate in view of the structures from the transperineal needle pathway to assess for possible pubic arch interference. In doing so, I did not appreciate any pubic arch interference. Also, the patient's prostate volume was estimated based on the drawn structure. The volume was 26 cc.  Given the pubic arch appearance and prostate volume, patient remains a good candidate to proceed with prostate seed implant. Today, he freely provided informed written consent to proceed.    PLAN: The patient will undergo prostate seed implant boost followed by 45 Gy IMRT to the pelvis.   ________________________________  Sheral Apley. Tammi Klippel, M.D.

## 2018-09-16 NOTE — Telephone Encounter (Signed)
CALLED PATIENT TO REMIND OF PRE-SEED PLANNING CT FOR 09-17-18, SPOKE WITH PATIENT AND HE IS AWARE OF THIS APPT.

## 2018-09-17 ENCOUNTER — Ambulatory Visit
Admission: RE | Admit: 2018-09-17 | Discharge: 2018-09-17 | Disposition: A | Discharge status: Home / Self Care | Payer: Medicare Other | Source: Ambulatory Visit | Attending: Radiation Oncology | Admitting: Radiation Oncology

## 2018-09-17 ENCOUNTER — Encounter: Payer: Self-pay | Admitting: Medical Oncology

## 2018-09-17 ENCOUNTER — Ambulatory Visit
Admission: RE | Admit: 2018-09-17 | Discharge: 2018-09-17 | Disposition: A | Discharge status: Home / Self Care | Payer: Medicare Other | Source: Ambulatory Visit | Attending: Urology | Admitting: Urology

## 2018-09-17 DIAGNOSIS — E785 Hyperlipidemia, unspecified: Secondary | ICD-10-CM | POA: Insufficient documentation

## 2018-09-17 DIAGNOSIS — C61 Malignant neoplasm of prostate: Secondary | ICD-10-CM

## 2018-09-17 DIAGNOSIS — M21371 Foot drop, right foot: Secondary | ICD-10-CM | POA: Diagnosis not present

## 2018-09-17 DIAGNOSIS — Z7982 Long term (current) use of aspirin: Secondary | ICD-10-CM | POA: Diagnosis not present

## 2018-09-17 DIAGNOSIS — Z87442 Personal history of urinary calculi: Secondary | ICD-10-CM | POA: Diagnosis not present

## 2018-09-17 DIAGNOSIS — Z800 Family history of malignant neoplasm of digestive organs: Secondary | ICD-10-CM | POA: Diagnosis not present

## 2018-09-17 DIAGNOSIS — Z79899 Other long term (current) drug therapy: Secondary | ICD-10-CM | POA: Diagnosis not present

## 2018-09-17 DIAGNOSIS — E119 Type 2 diabetes mellitus without complications: Secondary | ICD-10-CM | POA: Insufficient documentation

## 2018-09-17 DIAGNOSIS — Z8042 Family history of malignant neoplasm of prostate: Secondary | ICD-10-CM | POA: Insufficient documentation

## 2018-09-29 ENCOUNTER — Telehealth (INDEPENDENT_AMBULATORY_CARE_PROVIDER_SITE_OTHER): Payer: Self-pay | Admitting: Physician Assistant

## 2018-09-29 NOTE — Telephone Encounter (Signed)
Patient called for copy of his copays he paid in 2019 for his taxes. I printed and mailed to him.

## 2018-09-30 ENCOUNTER — Telehealth: Payer: Self-pay | Admitting: *Deleted

## 2018-09-30 ENCOUNTER — Other Ambulatory Visit: Payer: Self-pay | Admitting: Urology

## 2018-09-30 NOTE — Progress Notes (Signed)
Called and spoke w/ pt via phone to screen for corona virus per policy prior to PAT appointment tomorrow 10-01-2018.  Pt stated no symptoms and he nor any family member have not travelled out of the state/ country.

## 2018-09-30 NOTE — Telephone Encounter (Signed)
CALLED PATIENT TO INFORM OF APPT. FOR CHEST X-RAY AND EKG - ARRIVAL TIME - 8:30 AM @ [facility] ADMITTING, SPOKE WITH PATIENT AND HE IS AWARE OF THIS APPT.

## 2018-09-30 NOTE — Telephone Encounter (Signed)
Called patient to inform of implant date, spoke with patient 

## 2018-10-01 ENCOUNTER — Other Ambulatory Visit: Payer: Self-pay

## 2018-10-01 ENCOUNTER — Ambulatory Visit (HOSPITAL_COMMUNITY)
Admission: RE | Admit: 2018-10-01 | Discharge: 2018-10-01 | Disposition: A | Discharge status: Home / Self Care | Payer: Medicare Other | Source: Ambulatory Visit | Attending: Urology | Admitting: Urology

## 2018-10-01 ENCOUNTER — Encounter (HOSPITAL_COMMUNITY)
Admission: RE | Admit: 2018-10-01 | Discharge: 2018-10-01 | Disposition: A | Discharge status: Home / Self Care | Payer: Medicare Other | Source: Ambulatory Visit | Attending: Urology | Admitting: Urology

## 2018-10-01 DIAGNOSIS — C61 Malignant neoplasm of prostate: Secondary | ICD-10-CM | POA: Insufficient documentation

## 2018-10-01 DIAGNOSIS — Z01818 Encounter for other preprocedural examination: Secondary | ICD-10-CM | POA: Diagnosis not present

## 2018-10-01 DIAGNOSIS — E119 Type 2 diabetes mellitus without complications: Secondary | ICD-10-CM | POA: Diagnosis not present

## 2018-10-01 LAB — COMPREHENSIVE METABOLIC PANEL
ALT: 34 U/L (ref 0–44)
AST: 30 U/L (ref 15–41)
Albumin: 4.6 g/dL (ref 3.5–5.0)
Alkaline Phosphatase: 71 U/L (ref 38–126)
Anion gap: 7 (ref 5–15)
BILIRUBIN TOTAL: 1.1 mg/dL (ref 0.3–1.2)
BUN: 11 mg/dL (ref 8–23)
CO2: 27 mmol/L (ref 22–32)
CREATININE: 0.82 mg/dL (ref 0.61–1.24)
Calcium: 9.9 mg/dL (ref 8.9–10.3)
Chloride: 100 mmol/L (ref 98–111)
GFR calc Af Amer: >60 mL/min (ref >60)
Glucose, Bld: 213 mg/dL — ABNORMAL HIGH (ref 70–99)
Potassium: 4.1 mmol/L (ref 3.5–5.1)
Sodium: 134 mmol/L — ABNORMAL LOW (ref 135–145)
Total Protein: 7.4 g/dL (ref 6.5–8.1)

## 2018-10-01 LAB — CBC
HCT: 41.9 % (ref 39.0–52.0)
Hemoglobin: 13.2 g/dL (ref 13.0–17.0)
MCH: 26.1 pg (ref 26.0–34.0)
MCHC: 31.5 g/dL (ref 30.0–36.0)
MCV: 83 fL (ref 80.0–100.0)
Platelets: 151 10*3/uL (ref 150–400)
RBC: 5.05 MIL/uL (ref 4.22–5.81)
RDW: 14.1 % (ref 11.5–15.5)
WBC: 3.9 10*3/uL — ABNORMAL LOW (ref 4.0–10.5)
nRBC: 0 % (ref 0.0–0.2)

## 2018-10-01 LAB — APTT: aPTT: 29 seconds (ref 24–36)

## 2018-10-01 LAB — PROTIME-INR
INR: 1 (ref 0.8–1.2)
Prothrombin Time: 12.7 seconds (ref 11.4–15.2)

## 2018-10-01 NOTE — Progress Notes (Addendum)
Final EKG and CXR 2v dated 10-01-2018 in epic.

## 2018-11-10 ENCOUNTER — Other Ambulatory Visit (HOSPITAL_COMMUNITY): Payer: Medicare Other

## 2018-11-10 ENCOUNTER — Encounter (HOSPITAL_COMMUNITY): Admission: RE | Admit: 2018-11-10 | Payer: Medicare Other | Source: Ambulatory Visit

## 2018-11-11 ENCOUNTER — Other Ambulatory Visit: Payer: Self-pay | Admitting: *Deleted

## 2018-11-11 MED ORDER — ACARBOSE 50 MG PO TABS: ORAL_TABLET | ORAL | 0 refills | Status: DC

## 2018-11-12 ENCOUNTER — Other Ambulatory Visit: Payer: Self-pay | Admitting: Urology

## 2018-11-16 ENCOUNTER — Telehealth: Payer: Self-pay | Admitting: Nurse Practitioner

## 2018-11-16 ENCOUNTER — Ambulatory Visit: Payer: Medicare Other | Admitting: Nurse Practitioner

## 2018-11-16 MED ORDER — SITAGLIPTIN PHOSPHATE 100 MG PO TABS: 100.0000 mg | ORAL_TABLET | Freq: Every day | ORAL | 1 refills | Status: DC

## 2018-11-16 NOTE — Telephone Encounter (Signed)
Pt called and needs a refill of his JANUVIA, please advise on refill, TOC appt made with JJ

## 2018-11-16 NOTE — Telephone Encounter (Signed)
Reviewed chart pt is on medication sent 30 day supply until appt w/dr. [name].Marland KitchenJohny Massey

## 2018-11-22 ENCOUNTER — Other Ambulatory Visit (INDEPENDENT_AMBULATORY_CARE_PROVIDER_SITE_OTHER): Payer: Self-pay | Admitting: Physical Medicine and Rehabilitation

## 2018-11-23 ENCOUNTER — Ambulatory Visit: Payer: Medicare Other | Admitting: Internal Medicine

## 2018-11-23 NOTE — Telephone Encounter (Signed)
Please advise 

## 2018-11-24 ENCOUNTER — Encounter: Payer: Self-pay | Admitting: Internal Medicine

## 2018-11-24 ENCOUNTER — Other Ambulatory Visit: Payer: Self-pay

## 2018-11-24 ENCOUNTER — Ambulatory Visit (INDEPENDENT_AMBULATORY_CARE_PROVIDER_SITE_OTHER): Payer: Medicare Other | Admitting: Internal Medicine

## 2018-11-24 ENCOUNTER — Telehealth: Payer: Self-pay

## 2018-11-24 ENCOUNTER — Other Ambulatory Visit (INDEPENDENT_AMBULATORY_CARE_PROVIDER_SITE_OTHER): Payer: Medicare Other

## 2018-11-24 VITALS — BP 136/84 | HR 81 | Temp 98.1°F | Ht 67.0 in | Wt 164.0 lb

## 2018-11-24 DIAGNOSIS — I10 Essential (primary) hypertension: Secondary | ICD-10-CM

## 2018-11-24 DIAGNOSIS — G5602 Carpal tunnel syndrome, left upper limb: Secondary | ICD-10-CM | POA: Diagnosis not present

## 2018-11-24 DIAGNOSIS — E119 Type 2 diabetes mellitus without complications: Secondary | ICD-10-CM

## 2018-11-24 DIAGNOSIS — C61 Malignant neoplasm of prostate: Secondary | ICD-10-CM

## 2018-11-24 DIAGNOSIS — E782 Mixed hyperlipidemia: Secondary | ICD-10-CM | POA: Diagnosis not present

## 2018-11-24 LAB — URINALYSIS, ROUTINE W REFLEX MICROSCOPIC
Bilirubin Urine: NEGATIVE
Hgb urine dipstick: NEGATIVE
Ketones, ur: NEGATIVE
Leukocytes,Ua: NEGATIVE
Nitrite: NEGATIVE
RBC / HPF: NONE SEEN (ref 0–?)
Specific Gravity, Urine: 1.01 (ref 1.000–1.030)
Total Protein, Urine: NEGATIVE
Urine Glucose: NEGATIVE
Urobilinogen, UA: 0.2 (ref 0.0–1.0)
WBC, UA: NONE SEEN (ref 0–?)
pH: 6 (ref 5.0–8.0)

## 2018-11-24 LAB — BASIC METABOLIC PANEL
BUN: 11 mg/dL (ref 6–23)
CO2: 28 mEq/L (ref 19–32)
Calcium: 10.1 mg/dL (ref 8.4–10.5)
Chloride: 98 mEq/L (ref 96–112)
Creatinine, Ser: 0.79 mg/dL (ref 0.40–1.50)
GFR: 115.13 mL/min (ref >60.00)
Glucose, Bld: 127 mg/dL — ABNORMAL HIGH (ref 70–99)
Potassium: 3.7 mEq/L (ref 3.5–5.1)
Sodium: 136 mEq/L (ref 135–145)

## 2018-11-24 LAB — CBC WITH DIFFERENTIAL/PLATELET
Basophils Absolute: 0 10*3/uL (ref 0.0–0.1)
Basophils Relative: 1 % (ref 0.0–3.0)
Eosinophils Absolute: 0.2 10*3/uL (ref 0.0–0.7)
Eosinophils Relative: 3.4 % (ref 0.0–5.0)
HCT: 38.8 % — ABNORMAL LOW (ref 39.0–52.0)
Hemoglobin: 12.9 g/dL — ABNORMAL LOW (ref 13.0–17.0)
Lymphocytes Relative: 39.7 % (ref 12.0–46.0)
Lymphs Abs: 2 10*3/uL (ref 0.7–4.0)
MCHC: 33.2 g/dL (ref 30.0–36.0)
MCV: 79.3 fl (ref 78.0–100.0)
Monocytes Absolute: 0.6 10*3/uL (ref 0.1–1.0)
Monocytes Relative: 11.1 % (ref 3.0–12.0)
Neutro Abs: 2.2 10*3/uL (ref 1.4–7.7)
Neutrophils Relative %: 44.8 % (ref 43.0–77.0)
Platelets: 123 10*3/uL — ABNORMAL LOW (ref 150.0–400.0)
RBC: 4.9 Mil/uL (ref 4.22–5.81)
RDW: 14.5 % (ref 11.5–15.5)
WBC: 5 10*3/uL (ref 4.0–10.5)

## 2018-11-24 LAB — LIPID PANEL
Cholesterol: 175 mg/dL (ref 0–200)
HDL: 70.1 mg/dL (ref >39.00)
LDL Cholesterol: 69 mg/dL (ref 0–99)
NonHDL: 104.4
Total CHOL/HDL Ratio: 2
Triglycerides: 176 mg/dL — ABNORMAL HIGH (ref 0.0–149.0)
VLDL: 35.2 mg/dL (ref 0.0–40.0)

## 2018-11-24 LAB — HEPATIC FUNCTION PANEL
ALT: 28 U/L (ref 0–53)
AST: 25 U/L (ref 0–37)
Albumin: 4.5 g/dL (ref 3.5–5.2)
Alkaline Phosphatase: 81 U/L (ref 39–117)
Bilirubin, Direct: 0.2 mg/dL (ref 0.0–0.3)
Total Bilirubin: 0.8 mg/dL (ref 0.2–1.2)
Total Protein: 7.1 g/dL (ref 6.0–8.3)

## 2018-11-24 LAB — MICROALBUMIN / CREATININE URINE RATIO
Creatinine,U: 27.2 mg/dL
Microalb Creat Ratio: 2.6 mg/g (ref 0.0–30.0)
Microalb, Ur: <0.7 mg/dL (ref 0.0–1.9)

## 2018-11-24 LAB — HEMOGLOBIN A1C: Hgb A1c MFr Bld: 8 % — ABNORMAL HIGH (ref 4.6–6.5)

## 2018-11-24 LAB — TSH: TSH: 1.64 u[IU]/mL (ref 0.35–4.50)

## 2018-11-24 NOTE — Progress Notes (Signed)
Subjective:    Patient ID: Alex Massey, male    DOB: 1940/09/22, 78 y.o.   MRN: [id number]  HPI  Here to f/u; overall doing ok,  Pt denies chest pain, increasing sob or doe, wheezing, orthopnea, PND, increased LE swelling, palpitations, dizziness or syncope.  Pt denies new neurological symptoms such as new headache, or facial or extremity weakness or numbness.  Pt denies polydipsia, polyuria, or low sugar episode.  Pt states overall good compliance with meds, mostly trying to follow appropriate diet, with wt overall stable,  but little exercise however.  Walks 2.5 mile daily.  Wears AFO on right due to drop foot due to right lumbar radiculopathy.  Has known bilat cataracts and plans on eye doc f/u in August.  Due to pandemic has XRT to prostate cancer dealyed to July.  Also c/o left hand numbnes, tingling and mild discomfort 1-2+ worse in the AM to wake up, without dropping things or trauma, swelling.  Also has a kind of lesion to the distal pad of left middle fingertip, ? Wart like and chronic for many months.   Past Medical History:  Diagnosis Date  . DIABETES MELLITUS, TYPE II   . ERECTILE DYSFUNCTION   . FOOT DROP, RIGHT    s/p ESI x 3 at [place] in 2009 and 2016  . HYPERLIPIDEMIA   . NEPHROLITHIASIS, HX OF   . Prostate cancer [facility])    Past Surgical History:  Procedure Laterality Date  . L-spine surgery  [id number]  . LUMBAR EPIDURAL INJECTION  2009, 2006   dr newton    reports that he has never smoked. He has never used smokeless tobacco. He reports current alcohol use of about 3.0 standard drinks of alcohol per week. He reports that he does not use drugs. family history includes Colon cancer (age of onset: 87) in his brother; Diabetes in his father; Healthy in his mother; Prostate cancer in his brother, cousin, and nephew. Allergies  Allergen Reactions  . Metformin And Related Other (See Comments)    GI upset, diarrhea, wt loss   Current Outpatient Medications on File  Prior to Visit  Medication Sig Dispense Refill  . acarbose (PRECOSE) 50 MG tablet TAKE 1 TABLET BY MOUTH THREE TIMES DAILY WITH MEALS. Need to establish with new PCP. 270 tablet 0  . aspirin EC 81 MG tablet Take 1 tablet (81 mg total) by mouth daily. 150 tablet 2  . blood glucose meter kit and supplies KIT Inject 1 each into the skin 2 (two) times daily. Dispense based on patient and insurance preference. Use up to four times daily as directed. (FOR ICD-9 250.00, 250.01). 1 each 0  . Blood Glucose Monitoring Suppl (FREESTYLE LITE) DEVI USE TO CHECK GLUCOSE UP TO 4 TIMES DAILY AS DIRECTED    . gabapentin (NEURONTIN) 300 MG capsule Take 1 capsule by mouth at bedtime 90 capsule 0  . glucose blood (FREESTYLE TEST STRIPS) test strip Use to check blood sugars twice a day Dx CODE: E11.9 150 each 5  . Lancets (FREESTYLE) lancets Use to check blood sugars two times daily. Dx Code: E11.9 200 each 3  . losartan (COZAAR) 50 MG tablet TAKE 1 TABLET BY MOUTH DAILY 90 tablet 1  . simvastatin (ZOCOR) 20 MG tablet TAKE 1 TABLET(20 MG) BY MOUTH AT BEDTIME 90 tablet 1  . tamsulosin (FLOMAX) 0.4 MG CAPS capsule TK 1 C PO HS    . triamcinolone cream (KENALOG) 0.1 % Apply 1 application topically  2 (two) times daily. 80 g 0  . vitamin B-12 (CYANOCOBALAMIN) 1000 MCG tablet Take 1,000 mcg by mouth daily.     Current Facility-Administered Medications on File Prior to Visit  Medication Dose Route Frequency Provider Last Rate Last Dose  . lidocaine (PF) (XYLOCAINE) 1 % injection 0.3 mL  0.3 mL Other Once Magnus Sinning, MD       Review of Systems  Constitutional: Negative for other unusual diaphoresis or sweats HENT: Negative for ear discharge or swelling Eyes: Negative for other worsening visual disturbances Respiratory: Negative for stridor or other swelling  Gastrointestinal: Negative for worsening distension or other blood Genitourinary: Negative for retention or other urinary change Musculoskeletal: Negative  for other MSK pain or swelling Skin: Negative for color change or other new lesions Neurological: Negative for worsening tremors and other numbness  Psychiatric/Behavioral: Negative for worsening agitation or other fatigue All other system neg per pt    Objective:   Physical Exam BP 136/84   Pulse 81   Temp 98.1 F (36.7 C) (Oral)   Ht '5\' 7"'  (1.702 m)   Wt 164 lb (74.4 kg)   SpO2 98%   BMI 25.69 kg/m  VS noted,  Constitutional: Pt appears in NAD HENT: Head: NCAT.  Right Ear: External ear normal.  Left Ear: External ear normal.  Eyes: . Pupils are equal, round, and reactive to light. Conjunctivae and EOM are normal Nose: without d/c or deformity Neck: Neck supple. Gross normal ROM Cardiovascular: Normal rate and regular rhythm.   Pulmonary/Chest: Effort normal and breath sounds without rales or wheezing.  Abd:  Soft, NT, ND, + BS, no organomegaly Neurological: Pt is alert. At baseline orientation, motor grossly intact including left hand grip and sensation Skin: Skin is warm. No rashes, other new lesions, no LE edema Psychiatric: Pt behavior is normal without agitation  No other exam findings Lab Results  Component Value Date   WBC 5.0 11/24/2018   HGB 12.9 (L) 11/24/2018   HCT 38.8 (L) 11/24/2018   PLT 123.0 (L) 11/24/2018   GLUCOSE 127 (H) 11/24/2018   CHOL 175 11/24/2018   TRIG 176.0 (H) 11/24/2018   HDL 70.10 11/24/2018   LDLCALC 69 11/24/2018   ALT 28 11/24/2018   AST 25 11/24/2018   NA 136 11/24/2018   K 3.7 11/24/2018   CL 98 11/24/2018   CREATININE 0.79 11/24/2018   BUN 11 11/24/2018   CO2 28 11/24/2018   TSH 1.64 11/24/2018   PSA 4.41 (H) 02/05/2016   INR 1.0 10/01/2018   HGBA1C 8.0 (H) 11/24/2018   MICROALBUR <0.7 11/24/2018       Assessment & Plan:

## 2018-11-24 NOTE — Telephone Encounter (Signed)
I have not seen any paperwork. I did not send the Januvia rx to [place]. Sorry.

## 2018-11-24 NOTE — Telephone Encounter (Signed)
Pt called to follow up on medication assistance paper work for [employer] he dropped off last week. I never received these forms. Do you have these? Medicaiton should not have been sent to [facility] per the patient.

## 2018-11-24 NOTE — Patient Instructions (Signed)
Please consider making an appointment with Dr Amedeo Plenty at [name]  Please continue all other medications as before, and refills have been done if requested.  Please have the pharmacy call with any other refills you may need.  Please continue your efforts at being more active, low cholesterol diet, and weight control.  You are otherwise up to date with prevention measures today.  Please keep your appointments with your specialists as you may have planned  Please go to the LAB in the Basement (turn left off the elevator) for the tests to be done today  You will be contacted by phone if any changes need to be made immediately.  Otherwise, you will receive a letter about your results with an explanation, but please check with MyChart first.  Please remember to sign up for MyChart if you have not done so, as this will be important to you in the future with finding out test results, communicating by private email, and scheduling acute appointments online when needed.  Please return in 6 months, or sooner if needed

## 2018-11-25 ENCOUNTER — Telehealth: Payer: Self-pay

## 2018-11-25 ENCOUNTER — Encounter: Payer: Self-pay | Admitting: Internal Medicine

## 2018-11-25 ENCOUNTER — Other Ambulatory Visit: Payer: Self-pay | Admitting: Internal Medicine

## 2018-11-25 MED ORDER — SITAGLIPTIN PHOSPHATE 100 MG PO TABS: 100.0000 mg | ORAL_TABLET | Freq: Every day | ORAL | 1 refills | Status: DC

## 2018-11-25 MED ORDER — GLIPIZIDE ER 10 MG PO TB24: 10.0000 mg | ORAL_TABLET | Freq: Every day | ORAL | 3 refills | Status: DC

## 2018-11-25 NOTE — Addendum Note (Signed)
Addended by: Juliet Rude on: 11/25/2018 10:45 AM   Modules accepted: Orders

## 2018-11-25 NOTE — Telephone Encounter (Signed)
Pt has been informed of results and expressed understanding.  °

## 2018-11-25 NOTE — Telephone Encounter (Signed)
Spoke to pt, he stated that he may have another copy of the forms at home. He will look around and drop then off once found.

## 2018-11-25 NOTE — Telephone Encounter (Signed)
-----   Message from Biagio Borg, MD sent at 11/25/2018  1:46 PM EDT ----- Letter sent, cont same tx except  The test results show that your current treatment is OK, except the A1c is still moderately high.   We need to have you change the glimeparide to one that is more effective called glipizide ER 10 mg per day.  I will send the prescription, and you should hear from the office as well.    Bernarr Longsworth to please inform pt, I will do rx change

## 2018-11-26 ENCOUNTER — Telehealth: Payer: Self-pay | Admitting: Physical Medicine and Rehabilitation

## 2018-11-26 NOTE — Telephone Encounter (Signed)
Called patient to advise and to schedule an appointment with Dr. Ninfa Linden. No answer or voicemail.

## 2018-11-26 NOTE — Telephone Encounter (Signed)
He really should start with Dr. Ninfa Linden about the numbness in his hand and at the see him and want to schedule electrodiagnostic study obviously we can do that.  He is getting quicker to see them then [name] at this point.

## 2018-11-29 ENCOUNTER — Encounter: Payer: Self-pay | Admitting: Internal Medicine

## 2018-11-29 DIAGNOSIS — G5602 Carpal tunnel syndrome, left upper limb: Secondary | ICD-10-CM | POA: Insufficient documentation

## 2018-11-29 NOTE — Assessment & Plan Note (Signed)
stable overall by history and exam, recent data reviewed with pt, and pt to continue medical treatment as before,  to f/u any worsening symptoms or concerns  

## 2018-11-29 NOTE — Assessment & Plan Note (Signed)
With mild to mod symptoms, exam benign, declines hand surgeon dr Amedeo Plenty for now, but will call back if wants this

## 2018-11-29 NOTE — Assessment & Plan Note (Signed)
Also for f/u psa with labs,  to f/u any worsening symptoms or concerns

## 2018-12-08 ENCOUNTER — Other Ambulatory Visit: Payer: Self-pay

## 2018-12-08 ENCOUNTER — Ambulatory Visit: Payer: Medicare Other | Admitting: Orthopaedic Surgery

## 2018-12-08 ENCOUNTER — Encounter: Payer: Self-pay | Admitting: Orthopaedic Surgery

## 2018-12-08 DIAGNOSIS — R200 Anesthesia of skin: Secondary | ICD-10-CM | POA: Diagnosis not present

## 2018-12-08 DIAGNOSIS — L989 Disorder of the skin and subcutaneous tissue, unspecified: Secondary | ICD-10-CM | POA: Diagnosis not present

## 2018-12-08 NOTE — Progress Notes (Signed)
Office Visit Note   Patient: Alex [place]           Date of Birth: 1940-09-16           MRN: [id number] Visit Date: 12/08/2018              Requested by: Biagio Borg, MD [address], [place] [id number] PCP: Biagio Borg, MD   Assessment & Plan: Visit Diagnoses:  1. Numbness of left hand   2. Hand lesion     Plan: We will obtain  EMG/nerve conduction studies of the upper extremities rule out carpal tunnel syndrome involving the left hand.  In regards to the lesion on the left long finger will refer him to dermatology for evaluation and treatment of this.  Questions were encouraged and answered .   Follow-Up Instructions: Return After EMG/NCS.   Orders:  No orders of the defined types were placed in this encounter.  No orders of the defined types were placed in this encounter.     Procedures: No procedures performed   Clinical Data: No additional findings.   Subjective: Chief Complaint  Patient presents with  . Left Hand - Pain    HPI [name] returns today due to new complaint of left hand numbness past 4 to 5 months.  States it involves the thumb through the ring finger of his left hand.  Does awaken him at night.  He also reports that he had a history of carpal tunnel release in the past but he is unsure of which hand who actually performed surgery.  Patient is diabetic well controlled.  He also notes that he has a lesion on his left middle finger that is been there for the past 3 to 4 months.  He has been given some creams in the past by other providers to really did not help.  He notes tenderness at the tip of the long finger.  He denies any drainage from the middle finger. Review of Systems No fevers or chills.  Objective: Vital Signs: There were no vitals taken for this visit.  Physical Exam General: Well-developed well-nourished male in no acute distress mood and affect appropriate. Psych: Alert and oriented x3. Vascular: Radial  pulses are 2+ equal and symmetric Ortho Exam Left hand positive compression, Phalen's and Tinel's over the median nerve.  Right hand negative for Tinel's over the median nerve, Phalen's or compression test.  Subjective decreased sensation throughout the median distribution of the left hand.  Full sensation right hand.  Left hand long finger there is a lesion at the tip of the finger appears slightly macerated.  Unable to express any purulence.  There is some irregularities within the nailbed area of the long finger.  No abnormal warmth erythema left hand.  No other rashes skin lesions.  No surgical 1 incision daily identified on either hand. Specialty Comments:  No specialty comments available.  Imaging: No results found.   PMFS History: Patient Active Problem List   Diagnosis Date Noted  . Left carpal tunnel syndrome 11/29/2018  . Malignant neoplasm of prostate ([place]) 09/03/2018  . Vitamin B12 deficiency 02/13/2018  . Lumbar radiculopathy 07/09/2016  . Radiculopathy due to lumbar intervertebral disc disorder 07/09/2016  . Hemorrhoid 08/07/2015  . BPH without obstruction/lower urinary tract symptoms 02/01/2015  . Essential hypertension 09/12/2014  . Routine general medical examination at a health care facility 01/14/2011  . ERECTILE DYSFUNCTION 04/04/2008  . Hyperlipidemia 06/26/2007  . Diabetes (  [place]) 06/25/2007  . NEPHROLITHIASIS, HX OF 06/25/2007   Past Medical History:  Diagnosis Date  . DIABETES MELLITUS, TYPE II   . ERECTILE DYSFUNCTION   . FOOT DROP, RIGHT    s/p ESI x 3 at [place] in 2009 and 2016  . HYPERLIPIDEMIA   . NEPHROLITHIASIS, HX OF   . Prostate cancer [facility])     Family History  Problem Relation Age of Onset  . Healthy Mother   . Prostate cancer Brother   . Diabetes Father   . Colon cancer Brother 38  . Prostate cancer Cousin        maternal  . Prostate cancer Nephew     Past Surgical History:  Procedure Laterality Date  . L-spine surgery   [id number]  . LUMBAR EPIDURAL INJECTION  2009, 2006   dr newton   Social History   Occupational History    Comment: retired  Tobacco Use  . Smoking status: Never Smoker  . Smokeless tobacco: Never Used  Substance and Sexual Activity  . Alcohol use: Yes    Alcohol/week: 3.0 standard drinks    Types: 3 Glasses of wine per week  . Drug use: No  . Sexual activity: Not Currently    Partners: Female

## 2018-12-18 ENCOUNTER — Other Ambulatory Visit: Payer: Self-pay | Admitting: Urology

## 2018-12-18 DIAGNOSIS — C61 Malignant neoplasm of prostate: Secondary | ICD-10-CM

## 2019-01-01 ENCOUNTER — Ambulatory Visit (INDEPENDENT_AMBULATORY_CARE_PROVIDER_SITE_OTHER): Payer: Medicare Other | Admitting: Physical Medicine and Rehabilitation

## 2019-01-01 ENCOUNTER — Other Ambulatory Visit: Payer: Self-pay

## 2019-01-01 ENCOUNTER — Encounter: Payer: Self-pay | Admitting: Physical Medicine and Rehabilitation

## 2019-01-01 DIAGNOSIS — R202 Paresthesia of skin: Secondary | ICD-10-CM

## 2019-01-01 NOTE — Progress Notes (Signed)
 .  Numeric Pain Rating Scale and Functional Assessment Average Pain 8   In the last MONTH (on 0-10 scale) has pain interfered with the following?  1. General activity like being  able to carry out your everyday physical activities such as walking, climbing stairs, carrying groceries, or moving a chair?  Rating(8)   

## 2019-01-04 NOTE — Procedures (Signed)
EMG & NCV Findings: Evaluation of the left median motor nerve showed prolonged distal onset latency (6.5 ms) and decreased conduction velocity (Elbow-Wrist, 43 m/s).  The left median (across palm) sensory nerve showed prolonged distal peak latency (Wrist, 7.1 ms), reduced amplitude (5.2 V), and prolonged distal peak latency (Palm, 3.3 ms).  The left ulnar sensory nerve showed prolonged distal peak latency (3.8 ms) and decreased conduction velocity (Wrist-5th Digit, 37 m/s).  All remaining nerves (as indicated in the following tables) were within normal limits.    All examined muscles (as indicated in the following table) showed no evidence of electrical instability.    Impression: The above electrodiagnostic study is ABNORMAL and reveals evidence of a moderate left median nerve entrapment at the wrist (carpal tunnel syndrome) affecting sensory and motor components.   There is no significant electrodiagnostic evidence of any other focal nerve entrapment, brachial plexopathy, cervical radiculopathy or generalized peripheral neuropathy.  As you know, this particular electrodiagnostic study cannot rule out chemical radiculitis or sensory only radiculopathy.  Recommendations: 1.  Follow-up with referring physician. 2.  Continue current management of symptoms. 3.  Continue use of resting splint at night-time and as needed during the day. 4.  Suggest surgical evaluation.  ___________________________ Laurence Spates FAAPMR Board Certified, American Board of Physical Medicine and Rehabilitation    Nerve Conduction Studies Anti Sensory Summary Table   Stim Site NR Peak (ms) Norm Peak (ms) P-T Amp (V) Norm P-T Amp Site1 Site2 Delta-P (ms) Dist (cm) Vel (m/s) Norm Vel (m/s)  Left Median Acr Palm Anti Sensory (2nd Digit)  31.8C  Wrist    *7.1 <3.6 *5.2 >10 Wrist Palm 3.8 0.0    Palm    *3.3 <2.0 3.3         Left Radial Anti Sensory (Base 1st Digit)  33.5C  Wrist    2.5 <3.1 12.5  Wrist Base 1st  Digit 2.5 0.0    Left Ulnar Anti Sensory (5th Digit)  32.3C  Wrist    *3.8 <3.7 18.3 >15.0 Wrist 5th Digit 3.8 14.0 *37 >38   Motor Summary Table   Stim Site NR Onset (ms) Norm Onset (ms) O-P Amp (mV) Norm O-P Amp Site1 Site2 Delta-0 (ms) Dist (cm) Vel (m/s) Norm Vel (m/s)  Left Median Motor (Abd Poll Brev)  32.3C  Wrist    *6.5 <4.2 5.4 >5 Elbow Wrist 5.3 23.0 *43 >50  Elbow    11.8  2.5         Left Ulnar Motor (Abd Dig Min)  31.2C  Wrist    3.4 <4.2 7.0 >3 B Elbow Wrist 3.9 20.5 53 >53  B Elbow    7.3  7.4  A Elbow B Elbow 1.6 11.0 69 >53  A Elbow    8.9  1.1          EMG   Side Muscle Nerve Root Ins Act Fibs Psw Amp Dur Poly Recrt Int Fraser Din Comment  Left Abd Poll Brev Median C8-T1 Nml Nml Nml Nml Nml 0 Nml Nml   Left 1stDorInt Ulnar C8-T1 Nml Nml Nml Nml Nml 0 Nml Nml   Left PronatorTeres Median C6-7 Nml Nml Nml Nml Nml 0 Nml Nml   Left Biceps Musculocut C5-6 Nml Nml Nml Nml Nml 0 Nml Nml   Left Deltoid Axillary C5-6 Nml Nml Nml Nml Nml 0 Nml Nml     Nerve Conduction Studies Anti Sensory Left/Right Comparison   Stim Site L Lat (ms) R Lat (ms) L-R Lat (  ms) L Amp (V) R Amp (V) L-R Amp (%) Site1 Site2 L Vel (m/s) R Vel (m/s) L-R Vel (m/s)  Median Acr Palm Anti Sensory (2nd Digit)  31.8C  Wrist *7.1   *5.2   Wrist Palm     Palm *3.3   3.3         Radial Anti Sensory (Base 1st Digit)  33.5C  Wrist 2.5   12.5   Wrist Base 1st Digit     Ulnar Anti Sensory (5th Digit)  32.3C  Wrist *3.8   18.3   Wrist 5th Digit *37     Motor Left/Right Comparison   Stim Site L Lat (ms) R Lat (ms) L-R Lat (ms) L Amp (mV) R Amp (mV) L-R Amp (%) Site1 Site2 L Vel (m/s) R Vel (m/s) L-R Vel (m/s)  Median Motor (Abd Poll Brev)  32.3C  Wrist *6.5   5.4   Elbow Wrist *43    Elbow 11.8   2.5         Ulnar Motor (Abd Dig Min)  31.2C  Wrist 3.4   7.0   B Elbow Wrist 53    B Elbow 7.3   7.4   A Elbow B Elbow 69    A Elbow 8.9   1.1            Waveforms:

## 2019-01-07 NOTE — Progress Notes (Signed)
Alex [place] - 78 y.o. male MRN [id number]  Date of birth: 1940/07/25  Office Visit Note: Visit Date: 01/01/2019 PCP: Biagio Borg, MD Referred by: Biagio Borg, MD  Subjective: Chief Complaint  Patient presents with  . Left Hand - Pain, Numbness   HPI:  Alex [place] is a 78 y.o. male who comes in today At the request of Jean Rosenthal for electrodiagnostic study of the left upper limb.  Patient is right-hand dominant has had a history of prior carpal tunnel release but unsure what year that was.  He has a history of diabetes fairly well controlled.  No real history of diabetic neuropathy.  He reports 4 to 5 months ago seeing a blister on the very tip of his left middle finger and since that time he has felt like he has had difficulty bending the fingers and has had numbness and tingling particularly in the thumb and radial half of the ring finger.  His numbness and tingling is in a pretty classic median nerve distribution.  He reports seeing several people for the distal blister and lesion.  He is tried different creams without any result.  To my eyes appears to be a fungal type issue under the nail.  He does have nocturnal complaints of numbness and it does wake him up at night he has a positive flick sign.  He endorses some weakness in general.  He denies any frank radicular pain.  ROS Otherwise per HPI.  Assessment & Plan: Visit Diagnoses:  1. Paresthesia of skin     Plan: Impression: The above electrodiagnostic study is ABNORMAL and reveals evidence of a moderate left median nerve entrapment at the wrist (carpal tunnel syndrome) affecting sensory and motor components.   There is no significant electrodiagnostic evidence of any other focal nerve entrapment, brachial plexopathy, cervical radiculopathy or generalized peripheral neuropathy.  As you know, this particular electrodiagnostic study cannot rule out chemical radiculitis or sensory only radiculopathy.   Recommendations: 1.  Follow-up with referring physician. 2.  Continue current management of symptoms. 3.  Continue use of resting splint at night-time and as needed during the day. 4.  Suggest surgical evaluation.   Meds & Orders: No orders of the defined types were placed in this encounter.   Orders Placed This Encounter  Procedures  . NCV with EMG (electromyography)    Follow-up: Return for Jean Rosenthal, MD.   Procedures: No procedures performed  EMG & NCV Findings: Evaluation of the left median motor nerve showed prolonged distal onset latency (6.5 ms) and decreased conduction velocity (Elbow-Wrist, 43 m/s).  The left median (across palm) sensory nerve showed prolonged distal peak latency (Wrist, 7.1 ms), reduced amplitude (5.2 V), and prolonged distal peak latency (Palm, 3.3 ms).  The left ulnar sensory nerve showed prolonged distal peak latency (3.8 ms) and decreased conduction velocity (Wrist-5th Digit, 37 m/s).  All remaining nerves (as indicated in the following tables) were within normal limits.    All examined muscles (as indicated in the following table) showed no evidence of electrical instability.    Impression: The above electrodiagnostic study is ABNORMAL and reveals evidence of a moderate left median nerve entrapment at the wrist (carpal tunnel syndrome) affecting sensory and motor components.   There is no significant electrodiagnostic evidence of any other focal nerve entrapment, brachial plexopathy, cervical radiculopathy or generalized peripheral neuropathy.  As you know, this particular electrodiagnostic study cannot rule out chemical radiculitis or sensory only radiculopathy.  Recommendations: 1.  Follow-up with referring physician. 2.  Continue current management of symptoms. 3.  Continue use of resting splint at night-time and as needed during the day. 4.  Suggest surgical evaluation.  ___________________________ Laurence Spates FAAPMR Board  Certified, American Board of Physical Medicine and Rehabilitation    Nerve Conduction Studies Anti Sensory Summary Table   Stim Site NR Peak (ms) Norm Peak (ms) P-T Amp (V) Norm P-T Amp Site1 Site2 Delta-P (ms) Dist (cm) Vel (m/s) Norm Vel (m/s)  Left Median Acr Palm Anti Sensory (2nd Digit)  31.8C  Wrist    *7.1 <3.6 *5.2 >10 Wrist Palm 3.8 0.0    Palm    *3.3 <2.0 3.3         Left Radial Anti Sensory (Base 1st Digit)  33.5C  Wrist    2.5 <3.1 12.5  Wrist Base 1st Digit 2.5 0.0    Left Ulnar Anti Sensory (5th Digit)  32.3C  Wrist    *3.8 <3.7 18.3 >15.0 Wrist 5th Digit 3.8 14.0 *37 >38   Motor Summary Table   Stim Site NR Onset (ms) Norm Onset (ms) O-P Amp (mV) Norm O-P Amp Site1 Site2 Delta-0 (ms) Dist (cm) Vel (m/s) Norm Vel (m/s)  Left Median Motor (Abd Poll Brev)  32.3C  Wrist    *6.5 <4.2 5.4 >5 Elbow Wrist 5.3 23.0 *43 >50  Elbow    11.8  2.5         Left Ulnar Motor (Abd Dig Min)  31.2C  Wrist    3.4 <4.2 7.0 >3 B Elbow Wrist 3.9 20.5 53 >53  B Elbow    7.3  7.4  A Elbow B Elbow 1.6 11.0 69 >53  A Elbow    8.9  1.1          EMG   Side Muscle Nerve Root Ins Act Fibs Psw Amp Dur Poly Recrt Int Fraser Din Comment  Left Abd Poll Brev Median C8-T1 Nml Nml Nml Nml Nml 0 Nml Nml   Left 1stDorInt Ulnar C8-T1 Nml Nml Nml Nml Nml 0 Nml Nml   Left PronatorTeres Median C6-7 Nml Nml Nml Nml Nml 0 Nml Nml   Left Biceps Musculocut C5-6 Nml Nml Nml Nml Nml 0 Nml Nml   Left Deltoid Axillary C5-6 Nml Nml Nml Nml Nml 0 Nml Nml     Nerve Conduction Studies Anti Sensory Left/Right Comparison   Stim Site L Lat (ms) R Lat (ms) L-R Lat (ms) L Amp (V) R Amp (V) L-R Amp (%) Site1 Site2 L Vel (m/s) R Vel (m/s) L-R Vel (m/s)  Median Acr Palm Anti Sensory (2nd Digit)  31.8C  Wrist *7.1   *5.2   Wrist Palm     Palm *3.3   3.3         Radial Anti Sensory (Base 1st Digit)  33.5C  Wrist 2.5   12.5   Wrist Base 1st Digit     Ulnar Anti Sensory (5th Digit)  32.3C  Wrist *3.8   18.3   Wrist  5th Digit *37     Motor Left/Right Comparison   Stim Site L Lat (ms) R Lat (ms) L-R Lat (ms) L Amp (mV) R Amp (mV) L-R Amp (%) Site1 Site2 L Vel (m/s) R Vel (m/s) L-R Vel (m/s)  Median Motor (Abd Poll Brev)  32.3C  Wrist *6.5   5.4   Elbow Wrist *43    Elbow 11.8   2.5         Ulnar Motor (Abd  Dig Min)  31.2C  Wrist 3.4   7.0   B Elbow Wrist 53    B Elbow 7.3   7.4   A Elbow B Elbow 69    A Elbow 8.9   1.1            Waveforms:            Clinical History: MRI LUMBAR SPINE WITHOUT CONTRAST  TECHNIQUE: Multiplanar, multisequence MR imaging of the lumbar spine was performed. No intravenous contrast was administered.  COMPARISON:  08/02/2007  FINDINGS: Straightening of the normal lumbar lordosis is unchanged. There is no significant listhesis. Vertebral body heights are preserved. There is disc desiccation and severe disc space narrowing at L5-S1 with mild narrowing at L4-5. Mild degenerative marrow changes are present at L5-S1. Subcentimeter T1 hypointense focus in the posterior L3 vertebral body is unchanged and likely benign. Conus medullaris is normal in signal and terminates at L1. Paraspinal soft tissues are unremarkable.  L1-2:  Negative.  L2-3:  Mild disc bulging without stenosis, unchanged.  L3-4: Mild disc bulging and mild facet and ligamentum flavum hypertrophy result in mild right neural foraminal stenosis, minimally increased from prior.  L4-5: Prior right hemilaminectomy. Circumferential disc bulging slightly asymmetric to the right is again seen with suggestion of an increased although small right subarticular disc extrusion which may affect the right L5 nerve root in the lateral recess. A componenet of postoperative change in this region is also possible. There is no spinal stenosis. Circumferential disc bulging results in mild to moderate right greater than left neural foraminal stenosis, unchanged.  L5-S1: Prior right hemilaminectomy.  Mild circumferential disc bulging results in moderate left neural foraminal stenosis, unchanged. No spinal stenosis.  IMPRESSION: 1. Possible small right subarticular disc extrusion at L4-5 potentially affecting the right L5 nerve root. Some of this appearance could be postoperative in nature. Postcontrast imaging might be helpful for further differentiation. 2. Unchanged mild to moderate neural foraminal stenosis at L4-5 and moderate left neural foraminal stenosis at L5-S1.   Electronically Signed   By: Logan Bores   On: 07/17/2014 17:43     Objective:  VS:  HT:    WT:   BMI:     BP:   HR: bpm  TEMP: ( )  RESP:  Physical Exam Musculoskeletal:        General: No tenderness.     Comments: Inspection reveals no atrophy of the bilateral APB or FDI or hand intrinsics.  Distal end of the middle finger seems to have a fungal infection or fungal type lesion under the nail.  There is no swelling, color changes, allodynia or dystrophic changes. There is 5 out of 5 strength in the bilateral wrist extension, finger abduction and long finger flexion.  There is mild decreased sensation on the right median nerve distribution compared to right.  There is a negative Hoffmann's test bilaterally.  Skin:    General: Skin is warm and dry.     Findings: No erythema or rash.  Neurological:     General: No focal deficit present.     Mental Status: He is alert and oriented to person, place, and time.     Sensory: No sensory deficit.     Motor: No weakness or abnormal muscle tone.     Coordination: Coordination normal.     Gait: Gait normal.  Psychiatric:        Mood and Affect: Mood normal.        Behavior: Behavior  normal.        Thought Content: Thought content normal.     Ortho Exam Imaging: No results found.

## 2019-01-11 ENCOUNTER — Other Ambulatory Visit: Payer: Self-pay

## 2019-01-11 ENCOUNTER — Ambulatory Visit (INDEPENDENT_AMBULATORY_CARE_PROVIDER_SITE_OTHER): Payer: Medicare Other | Admitting: Physician Assistant

## 2019-01-11 ENCOUNTER — Encounter: Payer: Self-pay | Admitting: Physician Assistant

## 2019-01-11 DIAGNOSIS — G5602 Carpal tunnel syndrome, left upper limb: Secondary | ICD-10-CM | POA: Diagnosis not present

## 2019-01-11 NOTE — Progress Notes (Signed)
Office Visit Note   Patient: Alex Massey           Date of Birth: [birth year]           MRN: [id number] Visit Date: 01/11/2019              Requested by: Biagio Borg, MD [address] [place],  [place] [address] PCP: Biagio Borg, MD   Assessment & Plan: Visit Diagnoses:  1. Left carpal tunnel syndrome     Plan: Given patient's findings on EMG nerve conduction studies which showed moderate carpal tunnel left hand to the affected patient's tried conservative treatment which include splinting time and continues to have severe pain in his hand particularly at night recommend left carpal tunnel release.  Patient would like to proceed with surgery in the near future however he does have upcoming prostate surgery due to his prostate cancer.  He will check with his physician who is performing his prostate surgery and let them know that we would like to proceed with a left carpal tunnel release 1 week prior to his prostate surgery.  They have any concerns about this proceeding with surgery definitely prostate cancer would take precedence.  Questions were encouraged and answered.  Risk of infection wound healing problems all discussed with patient at length.  Postop protocol discussed with patient at length.  We will see him back 2 weeks postop.  Follow-Up Instructions: No follow-ups on file.   Orders:  No orders of the defined types were placed in this encounter.  No orders of the defined types were placed in this encounter.     Procedures: No procedures performed   Clinical Data: No additional findings.   Subjective: Chief Complaint  Patient presents with  . Left Hand - Pain    HPI Alex Massey is a 78 year old male comes in today to go over the EMG nerve conduction studies of the his upper extremities.  He continues to have significant pain in his left hand at night.  The pain goes numb.  He is tried splinting which really did not help.  Did have carpal tunnel  release on one side but he is not sure which side was done years ago.  He is diabetic reports good control of his diabetes.  He does have prostate cancer and due to undergo surgery for his prostate cancer on January 26, 2019. EMG nerve conduction studies upper extremities dated 01/01/2019 are reviewed with patient.  This shows an abnormal study with moderate left median nerve entrapment consistent with carpal tunnel syndrome.  Review of Systems Denies any fevers chills.  Objective: Vital Signs: There were no vitals taken for this visit.  Physical Exam General: Well-developed well-nourished male no acute distress. Ortho Exam Bilateral redness there appears to be possibly old healed incision over the right wrist region.  Otherwise his physical exam is unchanged from prior. Specialty Comments:  No specialty comments available.  Imaging: No results found.   PMFS History: Patient Active Problem List   Diagnosis Date Noted  . Left carpal tunnel syndrome 11/29/2018  . Malignant neoplasm of prostate ([place]) 09/03/2018  . Vitamin B12 deficiency 02/13/2018  . Lumbar radiculopathy 07/09/2016  . Radiculopathy due to lumbar intervertebral disc disorder 07/09/2016  . Hemorrhoid 08/07/2015  . BPH without obstruction/lower urinary tract symptoms 02/01/2015  . Essential hypertension 09/12/2014  . Routine general medical examination at a health care facility 01/14/2011  . ERECTILE DYSFUNCTION 04/04/2008  . Hyperlipidemia 06/26/2007  .  Diabetes ([place]) 06/25/2007  . NEPHROLITHIASIS, HX OF 06/25/2007   Past Medical History:  Diagnosis Date  . DIABETES MELLITUS, TYPE II   . ERECTILE DYSFUNCTION   . FOOT DROP, RIGHT    s/p ESI x 3 at [place] in 2009 and 2016  . HYPERLIPIDEMIA   . NEPHROLITHIASIS, HX OF   . Prostate cancer [facility])     Family History  Problem Relation Age of Onset  . Healthy Mother   . Prostate cancer Brother   . Diabetes Father   . Colon cancer Brother 75  . Prostate  cancer Cousin        maternal  . Prostate cancer Nephew     Past Surgical History:  Procedure Laterality Date  . L-spine surgery  [id number]  . LUMBAR EPIDURAL INJECTION  2009, 2006   dr newton   Social History   Occupational History    Comment: retired  Tobacco Use  . Smoking status: Never Smoker  . Smokeless tobacco: Never Used  Substance and Sexual Activity  . Alcohol use: Yes    Alcohol/week: 3.0 standard drinks    Types: 3 Glasses of wine per week  . Drug use: No  . Sexual activity: Not Currently    Partners: Female

## 2019-01-13 ENCOUNTER — Ambulatory Visit: Payer: Medicare Other | Admitting: Internal Medicine

## 2019-01-19 DIAGNOSIS — B078 Other viral warts: Secondary | ICD-10-CM | POA: Diagnosis not present

## 2019-01-21 ENCOUNTER — Telehealth: Payer: Self-pay | Admitting: *Deleted

## 2019-01-21 ENCOUNTER — Other Ambulatory Visit: Payer: Self-pay | Admitting: Orthopaedic Surgery

## 2019-01-21 DIAGNOSIS — G5602 Carpal tunnel syndrome, left upper limb: Secondary | ICD-10-CM | POA: Diagnosis not present

## 2019-01-21 DIAGNOSIS — Z789 Other specified health status: Secondary | ICD-10-CM

## 2019-01-21 HISTORY — DX: Other specified health status: Z78.9

## 2019-01-21 MED ORDER — HYDROCODONE-ACETAMINOPHEN 5-325 MG PO TABS: 1.0000 | ORAL_TABLET | Freq: Four times a day (QID) | ORAL | 0 refills | Status: DC | PRN

## 2019-01-21 NOTE — Telephone Encounter (Signed)
CALLED PATIENT TO REMIND OF LAB AND COVID 19 APPT. FOR 01-22-19 - ARRIVAL TIME - 9:45 AM @ [facility] ADMITTING, SPOKE WITH PATIENT AND HE IS AWARE OF THESE APPTS.

## 2019-01-22 ENCOUNTER — Encounter (HOSPITAL_COMMUNITY)
Admission: RE | Admit: 2019-01-22 | Discharge: 2019-01-22 | Disposition: A | Discharge status: Home / Self Care | Payer: Medicare Other | Source: Ambulatory Visit | Attending: Urology | Admitting: Urology

## 2019-01-22 ENCOUNTER — Other Ambulatory Visit (HOSPITAL_COMMUNITY)
Admission: RE | Admit: 2019-01-22 | Discharge: 2019-01-22 | Disposition: A | Discharge status: Home / Self Care | Payer: Medicare Other | Source: Ambulatory Visit | Attending: Urology | Admitting: Urology

## 2019-01-22 ENCOUNTER — Other Ambulatory Visit: Payer: Self-pay

## 2019-01-22 DIAGNOSIS — Z1159 Encounter for screening for other viral diseases: Secondary | ICD-10-CM | POA: Insufficient documentation

## 2019-01-22 LAB — COMPREHENSIVE METABOLIC PANEL
ALT: 27 U/L (ref 0–44)
AST: 20 U/L (ref 15–41)
Albumin: 4.2 g/dL (ref 3.5–5.0)
Alkaline Phosphatase: 84 U/L (ref 38–126)
Anion gap: 12 (ref 5–15)
BUN: 15 mg/dL (ref 8–23)
CO2: 23 mmol/L (ref 22–32)
Calcium: 9.6 mg/dL (ref 8.9–10.3)
Chloride: 102 mmol/L (ref 98–111)
Creatinine, Ser: 0.78 mg/dL (ref 0.61–1.24)
GFR calc Af Amer: >60 mL/min (ref >60)
GFR calc non Af Amer: >60 mL/min (ref >60)
Glucose, Bld: 204 mg/dL — ABNORMAL HIGH (ref 70–99)
Potassium: 4.1 mmol/L (ref 3.5–5.1)
Sodium: 137 mmol/L (ref 135–145)
Total Bilirubin: 0.5 mg/dL (ref 0.3–1.2)
Total Protein: 6.7 g/dL (ref 6.5–8.1)

## 2019-01-22 LAB — CBC
HCT: 37.2 % — ABNORMAL LOW (ref 39.0–52.0)
Hemoglobin: 11.4 g/dL — ABNORMAL LOW (ref 13.0–17.0)
MCH: 25.9 pg — ABNORMAL LOW (ref 26.0–34.0)
MCHC: 30.6 g/dL (ref 30.0–36.0)
MCV: 84.4 fL (ref 80.0–100.0)
Platelets: 122 10*3/uL — ABNORMAL LOW (ref 150–400)
RBC: 4.41 MIL/uL (ref 4.22–5.81)
RDW: 14.1 % (ref 11.5–15.5)
WBC: 5.1 10*3/uL (ref 4.0–10.5)
nRBC: 0 % (ref 0.0–0.2)

## 2019-01-22 LAB — PROTIME-INR
INR: 1 (ref 0.8–1.2)
Prothrombin Time: 13.1 seconds (ref 11.4–15.2)

## 2019-01-22 LAB — SARS CORONAVIRUS 2 (TAT 6-24 HRS): SARS Coronavirus 2: NEGATIVE

## 2019-01-22 LAB — APTT: aPTT: 28 seconds (ref 24–36)

## 2019-01-25 ENCOUNTER — Telehealth: Payer: Self-pay | Admitting: *Deleted

## 2019-01-25 ENCOUNTER — Encounter (HOSPITAL_BASED_OUTPATIENT_CLINIC_OR_DEPARTMENT_OTHER): Payer: Self-pay | Admitting: *Deleted

## 2019-01-25 ENCOUNTER — Other Ambulatory Visit: Payer: Self-pay

## 2019-01-25 NOTE — Progress Notes (Signed)
Spoke w/ pt via phone for pre-op interview.  Npo after mn w/ exception clear liquids until 0700 then nothing by mouth, pt verbalized understanding.  Current lab results and covid test results dated 01-22-2019 in epic and epic (cbc, cmp,pt/ptt).   Current ekg and cxr in chart and epic.  Pt verbalized understanding to do one fleet enema am dos.

## 2019-01-25 NOTE — Progress Notes (Signed)

## 2019-01-25 NOTE — Telephone Encounter (Signed)
CALLED PATIENT TO REMIND OF IMPLANT FOR 01-26-19, LINE BUSY UABLE TO LEAVE MESSAGE, WILL CALL LATER

## 2019-01-25 NOTE — Telephone Encounter (Signed)
CALLED PATIENT TO REMIND OF IMPLANT FOR 01-26-19, SPOKE WITH PATIENT AND HE IS AWARE OF THIS PROCEDURE

## 2019-01-26 ENCOUNTER — Encounter: Payer: Self-pay | Admitting: Medical Oncology

## 2019-01-26 ENCOUNTER — Ambulatory Visit (HOSPITAL_COMMUNITY): Payer: Medicare Other

## 2019-01-26 ENCOUNTER — Ambulatory Visit (HOSPITAL_BASED_OUTPATIENT_CLINIC_OR_DEPARTMENT_OTHER): Payer: Medicare Other | Admitting: Anesthesiology

## 2019-01-26 ENCOUNTER — Ambulatory Visit (HOSPITAL_BASED_OUTPATIENT_CLINIC_OR_DEPARTMENT_OTHER)
Admission: RE | Admit: 2019-01-26 | Discharge: 2019-01-26 | Disposition: A | Discharge status: Home / Self Care | Payer: Medicare Other | Attending: Urology | Admitting: Urology

## 2019-01-26 ENCOUNTER — Encounter (HOSPITAL_BASED_OUTPATIENT_CLINIC_OR_DEPARTMENT_OTHER): Payer: Self-pay | Admitting: *Deleted

## 2019-01-26 ENCOUNTER — Encounter (HOSPITAL_BASED_OUTPATIENT_CLINIC_OR_DEPARTMENT_OTHER)
Admission: RE | Disposition: A | Discharge status: Home / Self Care | Payer: Self-pay | Source: Home / Self Care | Attending: Urology

## 2019-01-26 ENCOUNTER — Other Ambulatory Visit: Payer: Self-pay

## 2019-01-26 DIAGNOSIS — K5909 Other constipation: Secondary | ICD-10-CM | POA: Diagnosis not present

## 2019-01-26 DIAGNOSIS — E1142 Type 2 diabetes mellitus with diabetic polyneuropathy: Secondary | ICD-10-CM | POA: Insufficient documentation

## 2019-01-26 DIAGNOSIS — N401 Enlarged prostate with lower urinary tract symptoms: Secondary | ICD-10-CM | POA: Diagnosis not present

## 2019-01-26 DIAGNOSIS — E538 Deficiency of other specified B group vitamins: Secondary | ICD-10-CM | POA: Diagnosis not present

## 2019-01-26 DIAGNOSIS — C61 Malignant neoplasm of prostate: Secondary | ICD-10-CM

## 2019-01-26 DIAGNOSIS — N400 Enlarged prostate without lower urinary tract symptoms: Secondary | ICD-10-CM | POA: Diagnosis not present

## 2019-01-26 DIAGNOSIS — Z888 Allergy status to other drugs, medicaments and biological substances status: Secondary | ICD-10-CM | POA: Insufficient documentation

## 2019-01-26 DIAGNOSIS — E119 Type 2 diabetes mellitus without complications: Secondary | ICD-10-CM | POA: Diagnosis not present

## 2019-01-26 DIAGNOSIS — Z7984 Long term (current) use of oral hypoglycemic drugs: Secondary | ICD-10-CM | POA: Diagnosis not present

## 2019-01-26 DIAGNOSIS — E785 Hyperlipidemia, unspecified: Secondary | ICD-10-CM | POA: Insufficient documentation

## 2019-01-26 DIAGNOSIS — Z7982 Long term (current) use of aspirin: Secondary | ICD-10-CM | POA: Diagnosis not present

## 2019-01-26 DIAGNOSIS — Z87891 Personal history of nicotine dependence: Secondary | ICD-10-CM | POA: Insufficient documentation

## 2019-01-26 DIAGNOSIS — Z79899 Other long term (current) drug therapy: Secondary | ICD-10-CM | POA: Insufficient documentation

## 2019-01-26 DIAGNOSIS — I10 Essential (primary) hypertension: Secondary | ICD-10-CM | POA: Diagnosis not present

## 2019-01-26 HISTORY — DX: Cyst of kidney, acquired: N28.1

## 2019-01-26 HISTORY — DX: Insomnia, unspecified: G47.00

## 2019-01-26 HISTORY — DX: Type 2 diabetes mellitus without complications: E11.9

## 2019-01-26 HISTORY — PX: RADIOACTIVE SEED IMPLANT: SHX5150

## 2019-01-26 HISTORY — DX: Benign prostatic hyperplasia with lower urinary tract symptoms: N40.1

## 2019-01-26 HISTORY — PX: CYSTOSCOPY: SHX5120

## 2019-01-26 HISTORY — DX: Personal history of urinary calculi: Z87.442

## 2019-01-26 HISTORY — DX: Presence of dental prosthetic device (complete) (partial): Z97.2

## 2019-01-26 HISTORY — DX: Male erectile dysfunction, unspecified: N52.9

## 2019-01-26 HISTORY — DX: Foot drop, right foot: M21.371

## 2019-01-26 HISTORY — DX: Other constipation: K59.09

## 2019-01-26 HISTORY — PX: SPACE OAR INSTILLATION: SHX6769

## 2019-01-26 HISTORY — DX: Polyneuropathy, unspecified: G62.9

## 2019-01-26 HISTORY — DX: Essential (primary) hypertension: I10

## 2019-01-26 HISTORY — DX: Hyperlipidemia, unspecified: E78.5

## 2019-01-26 HISTORY — DX: Deficiency of other specified B group vitamins: E53.8

## 2019-01-26 LAB — GLUCOSE, CAPILLARY
Glucose-Capillary: 145 mg/dL — ABNORMAL HIGH (ref 70–99)
Glucose-Capillary: 114 mg/dL — ABNORMAL HIGH (ref 70–99)

## 2019-01-26 SURGERY — INSERTION, RADIATION SOURCE, PROSTATE
Anesthesia: General | Site: Rectum

## 2019-01-26 MED ORDER — FENTANYL CITRATE (PF) 100 MCG/2ML IJ SOLN
INTRAMUSCULAR | Status: AC
  Filled 2019-01-26: qty 2

## 2019-01-26 MED ORDER — FENTANYL CITRATE (PF) 100 MCG/2ML IJ SOLN
INTRAMUSCULAR | Status: DC | PRN
  Administered 2019-01-26: 25 ug via INTRAVENOUS
  Administered 2019-01-26: 50 ug via INTRAVENOUS
  Administered 2019-01-26: 25 ug via INTRAVENOUS

## 2019-01-26 MED ORDER — ACETAMINOPHEN 160 MG/5ML PO SOLN
325.0000 mg | ORAL | Status: DC | PRN
  Filled 2019-01-26: qty 20.3

## 2019-01-26 MED ORDER — GLYCOPYRROLATE PF 0.2 MG/ML IJ SOSY
PREFILLED_SYRINGE | INTRAMUSCULAR | Status: AC
  Filled 2019-01-26: qty 1

## 2019-01-26 MED ORDER — PROPOFOL 10 MG/ML IV BOLUS
INTRAVENOUS | Status: DC | PRN
  Administered 2019-01-26: 120 mg via INTRAVENOUS

## 2019-01-26 MED ORDER — EPHEDRINE 5 MG/ML INJ
INTRAVENOUS | Status: AC
  Filled 2019-01-26: qty 10

## 2019-01-26 MED ORDER — ACETAMINOPHEN 325 MG PO TABS
325.0000 mg | ORAL_TABLET | ORAL | Status: DC | PRN
  Filled 2019-01-26: qty 2

## 2019-01-26 MED ORDER — MEPERIDINE HCL 25 MG/ML IJ SOLN
6.2500 mg | INTRAMUSCULAR | Status: DC | PRN
  Filled 2019-01-26: qty 1

## 2019-01-26 MED ORDER — ONDANSETRON HCL 4 MG/2ML IJ SOLN
INTRAMUSCULAR | Status: AC
  Filled 2019-01-26: qty 2

## 2019-01-26 MED ORDER — ONDANSETRON HCL 4 MG/2ML IJ SOLN
4.0000 mg | Freq: Once | INTRAMUSCULAR | Status: DC | PRN
  Filled 2019-01-26: qty 2

## 2019-01-26 MED ORDER — LIDOCAINE 2% (20 MG/ML) 5 ML SYRINGE
INTRAMUSCULAR | Status: AC
  Filled 2019-01-26: qty 5

## 2019-01-26 MED ORDER — IOHEXOL 300 MG/ML  SOLN
INTRAMUSCULAR | Status: DC | PRN
  Administered 2019-01-26: 7 mL

## 2019-01-26 MED ORDER — SUGAMMADEX SODIUM 500 MG/5ML IV SOLN
INTRAVENOUS | Status: AC
  Filled 2019-01-26: qty 5

## 2019-01-26 MED ORDER — MIDAZOLAM HCL 5 MG/5ML IJ SOLN
INTRAMUSCULAR | Status: DC | PRN
  Administered 2019-01-26: 1 mg via INTRAVENOUS

## 2019-01-26 MED ORDER — GLYCOPYRROLATE 0.2 MG/ML IJ SOLN
INTRAMUSCULAR | Status: DC | PRN
  Administered 2019-01-26: 0.2 mg via INTRAVENOUS

## 2019-01-26 MED ORDER — SODIUM CHLORIDE 0.9 % IR SOLN
Status: DC | PRN
  Administered 2019-01-26: 1000 mL via INTRAVESICAL

## 2019-01-26 MED ORDER — MIDAZOLAM HCL 2 MG/2ML IJ SOLN
INTRAMUSCULAR | Status: AC
  Filled 2019-01-26: qty 2

## 2019-01-26 MED ORDER — OXYCODONE HCL 5 MG/5ML PO SOLN
5.0000 mg | Freq: Once | ORAL | Status: DC | PRN
  Filled 2019-01-26: qty 5

## 2019-01-26 MED ORDER — LIDOCAINE HCL (CARDIAC) PF 100 MG/5ML IV SOSY
PREFILLED_SYRINGE | INTRAVENOUS | Status: DC | PRN
  Administered 2019-01-26: 50 mg via INTRAVENOUS

## 2019-01-26 MED ORDER — FENTANYL CITRATE (PF) 100 MCG/2ML IJ SOLN
25.0000 ug | INTRAMUSCULAR | Status: DC | PRN
  Filled 2019-01-26: qty 1

## 2019-01-26 MED ORDER — SODIUM CHLORIDE (PF) 0.9 % IJ SOLN
INTRAMUSCULAR | Status: DC | PRN
  Administered 2019-01-26: 10 mL

## 2019-01-26 MED ORDER — CIPROFLOXACIN IN D5W 400 MG/200ML IV SOLN
400.0000 mg | INTRAVENOUS | Status: AC
  Administered 2019-01-26: 400 mg via INTRAVENOUS
  Filled 2019-01-26: qty 200

## 2019-01-26 MED ORDER — PROPOFOL 10 MG/ML IV BOLUS
INTRAVENOUS | Status: AC
  Filled 2019-01-26: qty 20

## 2019-01-26 MED ORDER — OXYCODONE HCL 5 MG PO TABS
5.0000 mg | ORAL_TABLET | Freq: Once | ORAL | Status: DC | PRN
  Filled 2019-01-26: qty 1

## 2019-01-26 MED ORDER — GLYCOPYRROLATE PF 0.2 MG/ML IJ SOSY
PREFILLED_SYRINGE | INTRAMUSCULAR | Status: AC
  Filled 2019-01-26: qty 3

## 2019-01-26 MED ORDER — LACTATED RINGERS IV SOLN
INTRAVENOUS | Status: DC
  Administered 2019-01-26 (×2): via INTRAVENOUS
  Filled 2019-01-26: qty 1000

## 2019-01-26 MED ORDER — FLEET ENEMA 7-19 GM/118ML RE ENEM
1.0000 | ENEMA | Freq: Once | RECTAL | Status: DC
  Filled 2019-01-26: qty 1

## 2019-01-26 MED ORDER — KETOROLAC TROMETHAMINE 30 MG/ML IJ SOLN
INTRAMUSCULAR | Status: DC | PRN
  Administered 2019-01-26: 15 mg via INTRAVENOUS

## 2019-01-26 MED ORDER — EPHEDRINE SULFATE 50 MG/ML IJ SOLN
INTRAMUSCULAR | Status: DC | PRN
  Administered 2019-01-26 (×2): 10 mg via INTRAVENOUS

## 2019-01-26 MED ORDER — ONDANSETRON HCL 4 MG/2ML IJ SOLN
INTRAMUSCULAR | Status: DC | PRN
  Administered 2019-01-26: 4 mg via INTRAVENOUS

## 2019-01-26 MED ORDER — CIPROFLOXACIN IN D5W 400 MG/200ML IV SOLN
INTRAVENOUS | Status: AC
  Filled 2019-01-26: qty 200

## 2019-01-26 SURGICAL SUPPLY — 50 items
BAG URINE DRAINAGE (UROLOGICAL SUPPLIES) ×4 IMPLANT
BLADE CLIPPER SENSICLIP SURGIC (BLADE) ×4 IMPLANT
CATH FOLEY 2WAY SLVR  5CC 16FR (CATHETERS) ×1
CATH FOLEY 2WAY SLVR 5CC 16FR (CATHETERS) ×3 IMPLANT
CATH ROBINSON RED A/P 16FR (CATHETERS) IMPLANT
CATH ROBINSON RED A/P 20FR (CATHETERS) ×4 IMPLANT
CLOTH BEACON ORANGE TIMEOUT ST (SAFETY) ×4 IMPLANT
CONT SPECI 4OZ STER CLIK (MISCELLANEOUS) ×8 IMPLANT
COVER BACK TABLE 60X90IN (DRAPES) ×4 IMPLANT
COVER MAYO STAND STRL (DRAPES) ×4 IMPLANT
COVER WAND RF STERILE (DRAPES) ×3 IMPLANT
DRSG TEGADERM 4X4.75 (GAUZE/BANDAGES/DRESSINGS) ×7 IMPLANT
DRSG TEGADERM 8X12 (GAUZE/BANDAGES/DRESSINGS) ×8 IMPLANT
GAUZE SPONGE 4X4 12PLY STRL (GAUZE/BANDAGES/DRESSINGS) ×1 IMPLANT
GLOVE BIO SURGEON STRL SZ 6 (GLOVE) IMPLANT
GLOVE BIO SURGEON STRL SZ 6.5 (GLOVE) IMPLANT
GLOVE BIO SURGEON STRL SZ7 (GLOVE) ×1 IMPLANT
GLOVE BIO SURGEON STRL SZ7.5 (GLOVE) ×8 IMPLANT
GLOVE BIO SURGEON STRL SZ8 (GLOVE) ×1 IMPLANT
GLOVE BIOGEL M SZ8.5 STRL (GLOVE) ×3 IMPLANT
GLOVE BIOGEL PI IND STRL 6 (GLOVE) IMPLANT
GLOVE BIOGEL PI IND STRL 6.5 (GLOVE) IMPLANT
GLOVE BIOGEL PI IND STRL 7.0 (GLOVE) IMPLANT
GLOVE BIOGEL PI IND STRL 7.5 (GLOVE) IMPLANT
GLOVE BIOGEL PI IND STRL 8 (GLOVE) IMPLANT
GLOVE BIOGEL PI INDICATOR 6 (GLOVE)
GLOVE BIOGEL PI INDICATOR 6.5 (GLOVE)
GLOVE BIOGEL PI INDICATOR 7.0 (GLOVE)
GLOVE BIOGEL PI INDICATOR 7.5 (GLOVE) ×3
GLOVE BIOGEL PI INDICATOR 8 (GLOVE)
GLOVE ECLIPSE 8.0 STRL XLNG CF (GLOVE) ×4 IMPLANT
GOWN STRL REUS W/ TWL LRG LVL3 (GOWN DISPOSABLE) ×3 IMPLANT
GOWN STRL REUS W/ TWL XL LVL3 (GOWN DISPOSABLE) ×3 IMPLANT
GOWN STRL REUS W/TWL LRG LVL3 (GOWN DISPOSABLE) ×12
GOWN STRL REUS W/TWL XL LVL3 (GOWN DISPOSABLE) ×7 IMPLANT
HOLDER FOLEY CATH W/STRAP (MISCELLANEOUS) ×4 IMPLANT
IMPL SPACEOAR SYSTEM 10ML (Spacer) ×3 IMPLANT
IMPLANT SPACEOAR SYSTEM 10ML (Spacer) ×4 IMPLANT
IV NS 1000ML (IV SOLUTION) ×4
IV NS 1000ML BAXH (IV SOLUTION) ×3 IMPLANT
KIT TURNOVER CYSTO (KITS) ×4 IMPLANT
MARKER SKIN DUAL TIP RULER LAB (MISCELLANEOUS) ×4 IMPLANT
PACK CYSTO (CUSTOM PROCEDURE TRAY) ×4 IMPLANT
SURGILUBE 2OZ TUBE FLIPTOP (MISCELLANEOUS) ×4 IMPLANT
SUT BONE WAX W31G (SUTURE) IMPLANT
SYR 10ML LL (SYRINGE) ×4 IMPLANT
TUBE CONNECTING 12X1/4 (SUCTIONS) IMPLANT
UNDERPAD 30X30 (UNDERPADS AND DIAPERS) ×8 IMPLANT
WATER STERILE IRR 500ML POUR (IV SOLUTION) ×4 IMPLANT
i seed agx100 ×1 IMPLANT

## 2019-01-26 NOTE — H&P (Signed)
H&P  Chief Complaint: Prostate cancer  History of Present Illness: [name] is a 78 year old male with high risk prostate cancer.  His PSA has ranged from 6-10, normal DRE with Gleason 4+4 = 8, 4+3 = 7 and 3+4 = 7 on biopsy.  9 of the 12 cores were positive.  His prostate is about 26 g.  He began treatment with androgen deprivation undergoing Lupron 45 mg on 09/02/2018.  The patient will undergo prostate seed implant boost followed by 45 Gy IMRT to the pelvis.  He presents today for prostate seed implant boost.  He has been well.  No fevers, dysuria or hematuria.  Past Medical History:  Diagnosis Date  . Acquired right foot drop    post lumbar surgery,  wears brace  . B12 deficiency   . Chronic constipation    diet  . ED (erectile dysfunction)   . History of kidney stones   . Hyperlipidemia   . Hyperplasia of prostate with lower urinary tract symptoms (LUTS)   . Hypertension   . Insomnia   . Neuropathy, peripheral    right foot  . Presence of surgical incision 01/21/2019   s/p  left carpal tunnel release --  pt has dressing on,  denies pain or numbness  . Prostate cancer [facility]) urologist-- dr Aneta Hendershott/  oncologist-  dr Tammi Klippel   dx 08-04-2018,  Stage T1c, Gleason 4+4  . Renal cyst, right   . Type 2 diabetes mellitus ([place])    followed by pcp  . Wears partial dentures    upper and lower   Past Surgical History:  Procedure Laterality Date  . CARPAL TUNNEL RELEASE Bilateral left 01-21-2019;  right 1980s  . LUMBAR DISC SURGERY  1984  L4-5;  1998 L4--S1  . LUMBAR EPIDURAL INJECTION  2009, 2006   dr newton  . LUMBAR MICRODISCECTOMY  05-13-2002  dr Jenean Lindau  _0    L4-5    Home Medications:  Medications Prior to Admission  Medication Sig Dispense Refill Last Dose  . acarbose (PRECOSE) 50 MG tablet TAKE 1 TABLET BY MOUTH THREE TIMES DAILY WITH MEALS. Need to establish with new PCP. (Patient taking differently: Take 50 mg by mouth 3 (three) times daily with meals. TAKE 1 TABLET BY MOUTH  THREE TIMES DAILY WITH MEALS. Need to establish with new PCP.) 270 tablet 0 01/25/2019 at Unknown time  . aspirin EC 81 MG tablet Take 1 tablet (81 mg total) by mouth daily. 150 tablet 2 Past Week at Unknown time  . blood glucose meter kit and supplies KIT Inject 1 each into the skin 2 (two) times daily. Dispense based on patient and insurance preference. Use up to four times daily as directed. (FOR ICD-9 250.00, 250.01). 1 each 0 01/25/2019 at Unknown time  . Blood Glucose Monitoring Suppl (FREESTYLE LITE) DEVI USE TO CHECK GLUCOSE UP TO 4 TIMES DAILY AS DIRECTED   01/25/2019 at Unknown time  . gabapentin (NEURONTIN) 300 MG capsule Take 1 capsule by mouth at bedtime (Patient taking differently: Take 300 mg by mouth at bedtime. ) 90 capsule 0 01/25/2019 at Unknown time  . glipiZIDE (GLUCOTROL XL) 10 MG 24 hr tablet Take 1 tablet (10 mg total) by mouth daily with breakfast. (Patient taking differently: Take 10 mg by mouth daily with breakfast. ) 90 tablet 3 01/25/2019 at Unknown time  . glucose blood (FREESTYLE TEST STRIPS) test strip Use to check blood sugars twice a day Dx CODE: E11.9 150 each 5 01/25/2019 at Unknown time  .  HYDROcodone-acetaminophen (NORCO/VICODIN) 5-325 MG tablet Take 1 tablet by mouth every 6 (six) hours as needed for moderate pain. 30 tablet 0 01/25/2019 at Unknown time  . Lancets (FREESTYLE) lancets Use to check blood sugars two times daily. Dx Code: E11.9 200 each 3 01/25/2019 at Unknown time  . losartan (COZAAR) 50 MG tablet TAKE 1 TABLET BY MOUTH DAILY (Patient taking differently: Take 50 mg by mouth daily. Take 1 tablet by mouth daily.) 90 tablet 1 01/25/2019 at Unknown time  . simvastatin (ZOCOR) 20 MG tablet TAKE 1 TABLET(20 MG) BY MOUTH AT BEDTIME (Patient taking differently: Take 20 mg by mouth at bedtime. ) 90 tablet 1 01/25/2019 at Unknown time  . sitaGLIPtin (JANUVIA) 100 MG tablet Take 1 tablet (100 mg total) by mouth daily. 90 tablet 1 01/25/2019 at Unknown time  . tamsulosin  (FLOMAX) 0.4 MG CAPS capsule Take 0.4 mg by mouth.    01/25/2019 at Unknown time  . vitamin B-12 (CYANOCOBALAMIN) 1000 MCG tablet Take 1,000 mcg by mouth daily.   Past Week at Unknown time   Allergies:  Allergies  Allergen Reactions  . Metformin And Related Other (See Comments)    GI upset, diarrhea, wt loss    Family History  Problem Relation Age of Onset  . Healthy Mother   . Prostate cancer Brother   . Diabetes Father   . Colon cancer Brother 46  . Prostate cancer Cousin        maternal  . Prostate cancer Nephew    Social History:  reports that he quit smoking about 16 years ago. His smoking use included cigarettes. He quit after 20.00 years of use. He has never used smokeless tobacco. He reports current alcohol use of about 3.0 standard drinks of alcohol per week. He reports that he does not use drugs.  ROS: A complete review of systems was performed.  All systems are negative except for pertinent findings as noted. Review of Systems  All other systems reviewed and are negative.    Physical Exam:  Vital signs in last 24 hours: Temp:  [97.8 F (36.6 C)] 97.8 F (36.6 C) (07/21 1126) Pulse Rate:  [61] 61 (07/21 1126) Resp:  [16] 16 (07/21 1126) BP: (177)/(82) 177/82 (07/21 1126) SpO2:  [100 %] 100 % (07/21 1126) Weight:  [71.3 kg] 71.3 kg (07/21 1126) General:  Alert and oriented, No acute distress HEENT: Normocephalic, atraumatic Cardiovascular: Regular rate and rhythm Lungs: Regular rate and effort Abdomen: Soft, nontender, nondistended, no abdominal masses Back: No CVA tenderness Extremities: No edema Neurologic: Grossly intact  Laboratory Data:  Results for orders placed or performed during the hospital encounter of 01/26/19 (from the past 24 hour(s))  Glucose, capillary     Status: Abnormal   Collection Time: 01/26/19 12:07 PM  Result Value Ref Range   Glucose-Capillary 145 (H) 70 - 99 mg/dL   Recent Results (from the past 240 hour(s))  SARS Coronavirus 2  (Performed in [place] hospital lab)     Status: None   Collection Time: 01/22/19 11:32 AM   Specimen: Nasal Swab  Result Value Ref Range Status   SARS Coronavirus 2 NEGATIVE NEGATIVE Final    Comment: (NOTE) SARS-CoV-2 target nucleic acids are NOT DETECTED. The SARS-CoV-2 RNA is generally detectable in upper and lower respiratory specimens during the acute phase of infection. Negative results do not preclude SARS-CoV-2 infection, do not rule out co-infections with other pathogens, and should not be used as the sole basis for treatment or other  patient management decisions. Negative results must be combined with clinical observations, patient history, and epidemiological information. The expected result is Negative. Fact Sheet for Patients: SugarRoll.be Fact Sheet for Healthcare Providers: https://www.woods-mathews.com/ This test is not yet approved or cleared by the [place] FDA and  has been authorized for detection and/or diagnosis of SARS-CoV-2 by FDA under an Emergency Use Authorization (EUA). This EUA will remain  in effect (meaning this test can be used) for the duration of the COVID-19 declaration under Section 56 4(b)(1) of the Act, 21 U.S.C. section 360bbb-3(b)(1), unless the authorization is terminated or revoked sooner. Performed at [facility], [place] [address], [place], [place] [address]    Creatinine: Recent Labs    01/22/19 1014  CREATININE 0.78    Impression/Assessment/plan:   I discussed with the patient the nature, potential benefits, risks and alternatives to prostate seed implantation, biodegradable gel insertion, cystoscopy, including side effects of the proposed treatment, the likelihood of the patient achieving the goals of the procedure, and any potential problems that might occur during the procedure or recuperation. We also discussed the risk of proceeding with possible exposure to novel  coronavirus and the real risk of delay, including the expectation that a delay of 6-8 weeks or more may be required to emerge from an environment in which COVID-19 is less prevalent. All questions answered. Patient elects to proceed.    Festus Aloe 01/26/2019, 1:13 PM

## 2019-01-26 NOTE — Anesthesia Postprocedure Evaluation (Signed)
Anesthesia Post Note  Patient: Alex Massey  Procedure(s) Performed: RADIOACTIVE SEED IMPLANT/BRACHYTHERAPY IMPLANT (N/A Prostate) SPACE OAR INSTILLATION (N/A Rectum) CYSTOSCOPY FLEXIBLE (N/A Bladder)     Patient location during evaluation: PACU Anesthesia Type: General Level of consciousness: awake and alert Pain management: pain level controlled Vital Signs Assessment: post-procedure vital signs reviewed and stable Respiratory status: spontaneous breathing, nonlabored ventilation, respiratory function stable and patient connected to nasal cannula oxygen Cardiovascular status: blood pressure returned to baseline and stable Postop Assessment: no apparent nausea or vomiting Anesthetic complications: no    Last Vitals:  Vitals:   01/26/19 1515 01/26/19 1530  BP: (!) 163/83 (!) 167/89  Pulse: 90 83  Resp: 15 20  Temp:    SpO2: 100% 100%    Last Pain:  Vitals:   01/26/19 1503  TempSrc:   PainSc: 0-No pain                 Sharlyn Odonnel

## 2019-01-26 NOTE — Anesthesia Procedure Notes (Signed)
Procedure Name: LMA Insertion Date/Time: 01/26/2019 1:44 PM Performed by: Bufford Spikes, CRNA Pre-anesthesia Checklist: Patient identified, Emergency Drugs available, Suction available and Patient being monitored Patient Re-evaluated:Patient Re-evaluated prior to induction Oxygen Delivery Method: Circle system utilized Preoxygenation: Pre-oxygenation with 100% oxygen Induction Type: IV induction Ventilation: Mask ventilation without difficulty LMA: LMA inserted LMA Size: 4.0 Number of attempts: 1 Placement Confirmation: positive ETCO2 Tube secured with: Tape Dental Injury: Teeth and Oropharynx as per pre-operative assessment

## 2019-01-26 NOTE — Discharge Instructions (Signed)
Brachytherapy for Prostate Cancer, Care After ° °This sheet gives you information about how to care for yourself after your procedure. Your health care provider may also give you more specific instructions. If you have problems or questions, contact your health care provider. °What can I expect after the procedure? °After the procedure, it is common to have: °· Trouble passing urine. °· Blood in the urine or semen. °· Constipation. °· Frequent feeling of an urgent need to urinate. °· Bruising, swelling, and tenderness of the area behind the scrotum (perineum). °· Bloating and gas. °· Fatigue. °· Burning or pain in the rectum. °· Problems getting or keeping an erection (erectile dysfunction). °· Nausea. °Follow these instructions at home: °Managing pain, stiffness, and swelling °· If directed, apply ice to the affected area: °? Put ice in a plastic bag. °? Place a towel between your skin and the bag. °? Leave the ice on for 20 minutes, 2-3 times a day. °· Try not to sit directly on the area behind the scrotum. A soft cushion can help with discomfort. °Activity °· Do not drive for 24 hours if you were given a medicine to help you relax (sedative). °· Do not drive or use heavy machinery while taking prescription pain medicine. °· Rest as told by your health care provider. °· Most people can return to normal activities a few days or weeks after the procedure. Ask your health care provider what activities are safe for you. °Eating and drinking °· Drink enough fluid to keep your urine clear or pale yellow. °· Eat a healthy, balanced diet. This includes lean proteins, whole grains, and plenty of fruits and vegetables. °General instructions °· Take over-the-counter and prescription medicines only as told by your health care provider. °· Keep all follow-up visits as told by your health care provider. This is important. You may still need additional treatment. °· Do not take baths, swim, or use a hot tub until your health  care provider approves. Shower and wash the area behind the scrotum gently. °· Do not have sex for one week after the treatment, or until your health care provider approves. °· If you have permanent, low-dose brachytherapy implants: °? Limit close contact with children and pregnant women for 2 months or as told by your health care provider. This is important because of the radiation that is still active in the prostate. °? You may set off radioactive sensors, such as airport screenings. Ask your health care provider for a document that explains your treatment. °? You may be instructed to use a condom during sex for the first 2 months after low-dose brachytherapy. °Contact a health care provider if: °· You have a fever or chills. °· You do not have a bowel movement for 3-4 days after the procedure. °· You have diarrhea for 3-4 days after the procedure. °· You develop any new symptoms, such as problems with urinating or erectile dysfunction. °· You have abdomen (abdominal) pain. °· You have more blood in your urine. °Get help right away if: °· You cannot urinate. °· There is excessive bleeding from your rectum. °· You have unusual drainage coming from your rectum. °· You have severe pain in the treated area that does not go away with pain medicine. °· You have severe nausea or vomiting. °Summary °· If you have permanent, low-dose brachytherapy implants, limit close contact with children and pregnant women for 2 months or as told by your health care provider. This is important because of the radiation   that is still active in the prostate.  Talk with your health care provider about your risk of brachytherapy side effects, such as erectile dysfunction or urinary problems. Your health care provider will be able to recommend possible treatment options.  Keep all follow-up visits as told by your health care provider. This is important. You may need additional treatment. This information is not intended to replace  advice given to you by your health care provider. Make sure you discuss any questions you have with your health care provider. Document Released: 07/27/2010 Document Revised: 06/06/2017 Document Reviewed: 07/26/2016 Elsevier Patient Education  [place], ALEVE, MOTRIN, IBUPROFEN UNTIL 9 PM TONIGHT  Post Anesthesia Home Care Instructions  Activity: Get plenty of rest for the remainder of the day. A responsible adult should stay with you for 24 hours following the procedure.  For the next 24 hours, DO NOT: -Drive a car -Paediatric nurse -Drink alcoholic beverages -Take any medication unless instructed by your physician -Make any legal decisions or sign important papers.  Meals: Start with liquid foods such as gelatin or soup. Progress to regular foods as tolerated. Avoid greasy, spicy, heavy foods. If nausea and/or vomiting occur, drink only clear liquids until the nausea and/or vomiting subsides. Call your physician if vomiting continues.  Special Instructions/Symptoms: Your throat may feel dry or sore from the anesthesia or the breathing tube placed in your throat during surgery. If this causes discomfort, gargle with warm salt water. The discomfort should disappear within 24 hours.  If you had a scopolamine patch placed behind your ear for the management of post- operative nausea and/or vomiting:  1. The medication in the patch is effective for 72 hours, after which it should be removed.  Wrap patch in a tissue and discard in the trash. Wash hands thoroughly with soap and water. 2. You may remove the patch earlier than 72 hours if you experience unpleasant side effects which may include dry mouth, dizziness or visual disturbances. 3. Avoid touching the patch. Wash your hands with soap and water after contact with the patch.

## 2019-01-26 NOTE — Anesthesia Preprocedure Evaluation (Signed)
Anesthesia Evaluation  Patient identified by MRN, date of birth, ID band Patient awake    Reviewed: Allergy & Precautions, H&P , NPO status , Patient's Chart, lab work & pertinent test results, reviewed documented beta blocker date and time   Airway Mallampati: II  TM Distance: >3 FB Neck ROM: full    Dental no notable dental hx.    Pulmonary neg pulmonary ROS, former smoker,    Pulmonary exam normal breath sounds clear to auscultation       Cardiovascular Exercise Tolerance: Good hypertension, Pt. on medications  Rhythm:regular Rate:Normal     Neuro/Psych  Neuromuscular disease negative psych ROS   GI/Hepatic negative GI ROS, Neg liver ROS,   Endo/Other  diabetes, Type 2, Oral Hypoglycemic Agents  Renal/GU Renal disease  negative genitourinary   Musculoskeletal   Abdominal   Peds  Hematology negative hematology ROS (+)   Anesthesia Other Findings   Reproductive/Obstetrics negative OB ROS                             Anesthesia Physical Anesthesia Plan  ASA: III  Anesthesia Plan: General   Post-op Pain Management:    Induction: Intravenous  PONV Risk Score and Plan: 2 and Ondansetron and Treatment may vary due to age or medical condition  Airway Management Planned: LMA  Additional Equipment:   Intra-op Plan:   Post-operative Plan:   Informed Consent: I have reviewed the patients History and Physical, chart, labs and discussed the procedure including the risks, benefits and alternatives for the proposed anesthesia with the patient or authorized representative who has indicated his/her understanding and acceptance.     Dental Advisory Given  Plan Discussed with: CRNA, Anesthesiologist and Surgeon  Anesthesia Plan Comments: ( )        Anesthesia Quick Evaluation

## 2019-01-26 NOTE — Op Note (Signed)
Preoperative diagnosis: Stage I (T1cNxMx) Prostate cancer Postoperative diagnosis: Same  Procedure: Prostate brachytherapy seed implant, Cystoscopy, biodegradable gel insertion  Surgeon: Junious Silk  Radiation oncologist: Tammi Klippel  Anesthesia: Gen.  Indication for procedure: 78 yo-year-old with T1c, localized high risk prostate cancer who elected to proceed with prostate brachytherapy boost prior to XRT.   Findings: On fluoroscopic imaging there was adequate coverage of the prostate. On cystoscopy the urethra appeared normal, the prostatic urethra appeared normal, the trigone and ureteral orifices appeared normal with clear efflux. The bladder mucosa appeared normal. There were no stones, foreign bodies or seeds visualized in the bladder.  Dose:110 Gy  Description of procedure: After consent was obtained patient brought to the operating room. After adequate anesthesia he is placed in lithotomy position and the transrectal ultrasound probe and perineal template positioned. Catheters and brachytherapy seeds were placed per Dr. Johny Shears plan. A total of 22 catheters and 54 active sources (I-125) were placed. The anchoring needles, template and ultrasound were removed. Scout flouro imaging was obtained of the implant. The Foley was removed. Another flouro image was obtained.   The 18-gauge needle was then inserted approximately 1 to 2 cm anterior to the anal opening and directed under fluoroscopic guidance into the perirectal fat between the anterior rectal wall and the prostate capsule down to the mid-gland. Midline needle position was confirmed in the sagittal and axial views to verify the tip was in the perirectal fat.  Small amounts of saline were injected to hydrodissect the space between the prostate and the anterior rectal wall.  Axial imaging was viewed to confirm the needle was in the correct location in the mid gland and centered.  Aspiration confirmed no intravascular access.  The  saline syringe was carefully disconnected maintaining the desired needle position and the hydrogel was attached to the needle.  Under ultrasound guidance in the sagittal view a smooth continuous injection was done over about 12 seconds delivering the hydrogel into the space between the prostate and rectal wall.  The needle was withdrawn.  The patient was prepped again and cystoscopy was performed which was noted to be normal. A 16 French catheter was placed and left gravity drainage. He was awakened taken to the recovery room in stable condition.  Complications: None  Blood loss: Minimal  Specimens: None  Drains: None  Disposition: Patient stable to PACU.

## 2019-01-26 NOTE — Transfer of Care (Signed)
Immediate Anesthesia Transfer of Care Note  Patient: Alex Massey  Procedure(s) Performed: RADIOACTIVE SEED IMPLANT/BRACHYTHERAPY IMPLANT (N/A Prostate) SPACE OAR INSTILLATION (N/A Rectum) CYSTOSCOPY FLEXIBLE (N/A Bladder)  Patient Location: PACU  Anesthesia Type:General  Level of Consciousness: awake, alert  and oriented  Airway & Oxygen Therapy: Patient Spontanous Breathing and Patient connected to nasal cannula oxygen  Post-op Assessment: Report given to RN and Post -op Vital signs reviewed and stable  Post vital signs: Reviewed and stable  Last Vitals:  Vitals Value Taken Time  BP 154/78 01/26/19 1503  Temp 36.4 C 01/26/19 1503  Pulse 96 01/26/19 1504  Resp 13 01/26/19 1504  SpO2 100 % 01/26/19 1504  Vitals shown include unvalidated device data.  Last Pain:  Vitals:   01/26/19 1126  TempSrc: Oral  PainSc: 0-No pain      Patients Stated Pain Goal: 6 (00/34/91 7915)  Complications: No apparent anesthesia complications

## 2019-01-27 ENCOUNTER — Encounter (HOSPITAL_BASED_OUTPATIENT_CLINIC_OR_DEPARTMENT_OTHER): Payer: Self-pay | Admitting: Urology

## 2019-01-30 NOTE — Progress Notes (Signed)
  Radiation Oncology         [phone] ________________________________  Name: Alex Massey MRN: [id number]  Date: 01/30/2019  DOB: 27-Dec-1940       Prostate Seed Implant  WE:XHBZ, Hunt Oris, MD  No ref. provider found  DIAGNOSIS:  78 y.o. gentleman with Stage T1c adenocarcinoma of the prostate with Gleason score of 4+4, and PSA of 7.05    ICD-10-CM   1. Malignant neoplasm of prostate [facility])  [place] Discharge patient    PROCEDURE: Insertion of radioactive I-125 seeds into the prostate gland.  RADIATION DOSE: 110 Gy, boost therapy.  TECHNIQUE: Alex Massey was brought to the operating room with the urologist. He was placed in the dorsolithotomy position. He was catheterized and a rectal tube was inserted. The perineum was shaved, prepped and draped. The ultrasound probe was then introduced into the rectum to see the prostate gland.  TREATMENT DEVICE: A needle grid was attached to the ultrasound probe stand and anchor needles were placed.  3D PLANNING: The prostate was imaged in 3D using a sagittal sweep of the prostate probe. These images were transferred to the planning computer. There, the prostate, urethra and rectum were defined on each axial reconstructed image. Then, the software created an optimized 3D plan and a few seed positions were adjusted. The quality of the plan was reviewed using [facility] information for the target and the following two organs at risk:  Urethra and Rectum.  Then the accepted plan was printed and handed off to the radiation therapist.  Under my supervision, the custom loading of the seeds and spacers was carried out and loaded into sealed vicryl sleeves.  These pre-loaded needles were then placed into the needle holder.Marland Kitchen  PROSTATE VOLUME STUDY:  Using transrectal ultrasound the volume of the prostate was verified to be 21.7 cc.  SPECIAL TREATMENT PROCEDURE/SUPERVISION AND HANDLING: The pre-loaded needles were then delivered under sagittal guidance. A total  of 22 needles were used to deposit 54 seeds in the prostate gland. The individual seed activity was 0.282 mCi.  SpaceOAR:  Yes  COMPLEX SIMULATION: At the end of the procedure, an anterior radiograph of the pelvis was obtained to document seed positioning and count. Cystoscopy was performed to check the urethra and bladder.  MICRODOSIMETRY: At the end of the procedure, the patient was emitting 0.106 mR/hr at 1 meter. Accordingly, he was considered safe for hospital discharge.  PLAN: The patient will return to the radiation oncology clinic for post implant CT dosimetry in three weeks.   ________________________________  Sheral Apley Tammi Klippel, M.D.

## 2019-02-04 ENCOUNTER — Other Ambulatory Visit: Payer: Self-pay

## 2019-02-04 ENCOUNTER — Ambulatory Visit (INDEPENDENT_AMBULATORY_CARE_PROVIDER_SITE_OTHER): Payer: Medicare Other | Admitting: Orthopaedic Surgery

## 2019-02-04 ENCOUNTER — Encounter: Payer: Self-pay | Admitting: Orthopaedic Surgery

## 2019-02-04 DIAGNOSIS — Z9889 Other specified postprocedural states: Secondary | ICD-10-CM | POA: Insufficient documentation

## 2019-02-04 NOTE — Progress Notes (Signed)
The patient is 2 weeks today status post a left open carpal tunnel release.  He is 78 years old.  He is doing well but there is some soreness and stiffness.  He does report improvement in his sensation.  His carpal tunnel disease was very severe.  On exam the sutures have been removed and Steri-Strips applied to his left palm incision.  There is no evidence of infection at all.  He moves his fingers and thumb easily.  There is some slight swelling but no redness.  He will continue to use his hand as comfort allows.  All questions concerns were answered and addressed.  He would like to just follow-up as needed since he is doing well.  He understands that he can take 6 months to a year to get full recovery and he may not get full recovery given the severity of his disease and his age and he understands this as well.  He is otherwise very satisfied.

## 2019-02-05 ENCOUNTER — Telehealth: Payer: Self-pay | Admitting: *Deleted

## 2019-02-05 NOTE — Telephone Encounter (Signed)
CALLED PATIENT TO INFORM OF POST SEED APPTS. AND MRI FOR 02-11-19, SPOKE WITH PATIENT AND HE IS AWARE OF THESE APPTS.

## 2019-02-07 ENCOUNTER — Other Ambulatory Visit: Payer: Self-pay | Admitting: Internal Medicine

## 2019-02-10 ENCOUNTER — Telehealth: Payer: Self-pay | Admitting: *Deleted

## 2019-02-10 NOTE — Telephone Encounter (Signed)
CALLED PATIENT TO REMIND OF POST SEED APPTS. AND HIS MRI FOR 02-11-19, LVM FOR A RETURN CALL

## 2019-02-11 ENCOUNTER — Other Ambulatory Visit: Payer: Self-pay | Admitting: *Deleted

## 2019-02-11 ENCOUNTER — Ambulatory Visit (HOSPITAL_COMMUNITY)
Admission: RE | Admit: 2019-02-11 | Discharge: 2019-02-11 | Disposition: A | Discharge status: Home / Self Care | Payer: Medicare Other | Source: Ambulatory Visit | Attending: Urology | Admitting: Urology

## 2019-02-11 ENCOUNTER — Ambulatory Visit
Admission: RE | Admit: 2019-02-11 | Discharge: 2019-02-11 | Disposition: A | Discharge status: Home / Self Care | Payer: Medicare Other | Source: Ambulatory Visit | Attending: Radiation Oncology | Admitting: Radiation Oncology

## 2019-02-11 ENCOUNTER — Ambulatory Visit: Payer: Medicare Other | Admitting: Urology

## 2019-02-11 ENCOUNTER — Other Ambulatory Visit: Payer: Self-pay

## 2019-02-11 DIAGNOSIS — Z510 Encounter for antineoplastic radiation therapy: Secondary | ICD-10-CM | POA: Insufficient documentation

## 2019-02-11 DIAGNOSIS — C61 Malignant neoplasm of prostate: Secondary | ICD-10-CM | POA: Diagnosis not present

## 2019-02-11 MED ORDER — LOSARTAN POTASSIUM 50 MG PO TABS: 50.0000 mg | ORAL_TABLET | Freq: Every day | ORAL | 1 refills | Status: DC

## 2019-02-11 MED ORDER — SIMVASTATIN 20 MG PO TABS: ORAL_TABLET | ORAL | 1 refills | Status: DC

## 2019-02-14 NOTE — Progress Notes (Signed)
  Radiation Oncology         [phone] ________________________________  Name: Alex DEBONO MRN: [id number]  Date: 02/11/2019  DOB: [birth year]  COMPLEX SIMULATION NOTE  NARRATIVE:  The patient was brought to the [facility] [place] today following prostate seed implantation approximately one month ago.  Identity was confirmed.  All relevant records and images related to the planned course of therapy were reviewed.  Then, the patient was set-up supine.  CT images were obtained.  The CT images were loaded into the planning software.  Then the prostate and rectum were contoured.  Treatment planning then occurred.  The implanted iodine 125 seeds were identified by the physics staff for projection of radiation distribution  I have requested : 3D Simulation  I have requested a DVH of the following structures: Prostate and rectum.    ________________________________  Sheral Apley Tammi Klippel, M.D.

## 2019-02-14 NOTE — Progress Notes (Signed)
  Radiation Oncology         [phone] ________________________________  Name: Alex Massey MRN: [id number]  Date: 02/11/2019  DOB: Dec 26, 1940  SIMULATION AND TREATMENT PLANNING NOTE    ICD-10-CM   1. Malignant neoplasm of prostate ([place])  C61     DIAGNOSIS:  78 y.o. gentleman with Stage T1c adenocarcinoma of the prostate with Gleason score of 4+4, and PSA of 7.05  NARRATIVE:  The patient was brought to the [facility].  Identity was confirmed.  All relevant records and images related to the planned course of therapy were reviewed.  The patient freely provided informed written consent to proceed with treatment after reviewing the details related to the planned course of therapy. The consent form was witnessed and verified by the simulation staff.  Then, the patient was set-up in a stable reproducible supine position for radiation therapy.  A vacuum lock pillow device was custom fabricated to position his legs in a reproducible immobilized position.  Then, I performed a urethrogram under sterile conditions to identify the prostatic apex.  CT images were obtained.  Surface markings were placed.  The CT images were loaded into the planning software.  Then the prostate target and avoidance structures including the rectum, bladder, bowel and hips were contoured.  Treatment planning then occurred.  The radiation prescription was entered and confirmed.  A total of one complex treatment devices were fabricated. I have requested : Intensity Modulated Radiotherapy (IMRT) is medically necessary for this case for the following reason:  Rectal sparing.Marland Kitchen  PLAN:  The patient will receive 45 Gy in 25 fractions of 1.8 Gy, to supplement an up-front prostate seed implant boost of 110 Gy to achieve a total nominal dose of 165 Gy.  ________________________________  Sheral Apley Tammi Klippel, M.D.

## 2019-02-16 DIAGNOSIS — Z510 Encounter for antineoplastic radiation therapy: Secondary | ICD-10-CM | POA: Diagnosis not present

## 2019-02-16 DIAGNOSIS — C61 Malignant neoplasm of prostate: Secondary | ICD-10-CM | POA: Diagnosis not present

## 2019-02-22 ENCOUNTER — Encounter: Payer: Self-pay | Admitting: Medical Oncology

## 2019-02-22 ENCOUNTER — Ambulatory Visit
Admission: RE | Admit: 2019-02-22 | Discharge: 2019-02-22 | Disposition: A | Discharge status: Home / Self Care | Payer: Medicare Other | Source: Ambulatory Visit | Attending: Radiation Oncology | Admitting: Radiation Oncology

## 2019-02-22 ENCOUNTER — Other Ambulatory Visit: Payer: Self-pay

## 2019-02-22 DIAGNOSIS — Z510 Encounter for antineoplastic radiation therapy: Secondary | ICD-10-CM | POA: Diagnosis not present

## 2019-02-22 DIAGNOSIS — C61 Malignant neoplasm of prostate: Secondary | ICD-10-CM | POA: Diagnosis not present

## 2019-02-22 DIAGNOSIS — B078 Other viral warts: Secondary | ICD-10-CM | POA: Diagnosis not present

## 2019-02-23 ENCOUNTER — Other Ambulatory Visit: Payer: Self-pay

## 2019-02-23 ENCOUNTER — Ambulatory Visit
Admission: RE | Admit: 2019-02-23 | Discharge: 2019-02-23 | Disposition: A | Discharge status: Home / Self Care | Payer: Medicare Other | Source: Ambulatory Visit | Attending: Radiation Oncology | Admitting: Radiation Oncology

## 2019-02-23 DIAGNOSIS — Z510 Encounter for antineoplastic radiation therapy: Secondary | ICD-10-CM | POA: Diagnosis not present

## 2019-02-23 DIAGNOSIS — C61 Malignant neoplasm of prostate: Secondary | ICD-10-CM | POA: Diagnosis not present

## 2019-02-24 ENCOUNTER — Other Ambulatory Visit: Payer: Self-pay

## 2019-02-24 ENCOUNTER — Ambulatory Visit
Admission: RE | Admit: 2019-02-24 | Discharge: 2019-02-24 | Disposition: A | Discharge status: Home / Self Care | Payer: Medicare Other | Source: Ambulatory Visit | Attending: Radiation Oncology | Admitting: Radiation Oncology

## 2019-02-24 DIAGNOSIS — C61 Malignant neoplasm of prostate: Secondary | ICD-10-CM | POA: Diagnosis not present

## 2019-02-24 DIAGNOSIS — Z510 Encounter for antineoplastic radiation therapy: Secondary | ICD-10-CM | POA: Diagnosis not present

## 2019-02-25 ENCOUNTER — Encounter: Payer: Self-pay | Admitting: Radiation Oncology

## 2019-02-25 ENCOUNTER — Ambulatory Visit
Admission: RE | Admit: 2019-02-25 | Discharge: 2019-02-25 | Disposition: A | Discharge status: Home / Self Care | Payer: Medicare Other | Source: Ambulatory Visit | Attending: Radiation Oncology | Admitting: Radiation Oncology

## 2019-02-25 ENCOUNTER — Other Ambulatory Visit: Payer: Self-pay

## 2019-02-25 DIAGNOSIS — C61 Malignant neoplasm of prostate: Secondary | ICD-10-CM | POA: Diagnosis not present

## 2019-02-25 DIAGNOSIS — Z510 Encounter for antineoplastic radiation therapy: Secondary | ICD-10-CM | POA: Diagnosis not present

## 2019-02-26 ENCOUNTER — Other Ambulatory Visit: Payer: Self-pay | Admitting: Radiation Oncology

## 2019-02-26 ENCOUNTER — Ambulatory Visit
Admission: RE | Admit: 2019-02-26 | Discharge: 2019-02-26 | Disposition: A | Discharge status: Home / Self Care | Payer: Medicare Other | Source: Ambulatory Visit | Attending: Radiation Oncology | Admitting: Radiation Oncology

## 2019-02-26 ENCOUNTER — Other Ambulatory Visit: Payer: Self-pay

## 2019-02-26 DIAGNOSIS — Z510 Encounter for antineoplastic radiation therapy: Secondary | ICD-10-CM | POA: Diagnosis not present

## 2019-02-26 DIAGNOSIS — C61 Malignant neoplasm of prostate: Secondary | ICD-10-CM | POA: Diagnosis not present

## 2019-02-26 MED ORDER — TAMSULOSIN HCL 0.4 MG PO CAPS: 0.4000 mg | ORAL_CAPSULE | Freq: Every day | ORAL | 5 refills | Status: DC

## 2019-02-28 NOTE — Progress Notes (Signed)
  Radiation Oncology         [phone] ________________________________  Name: [name] MRN: [id number]  Date: 02/25/2019  DOB: 19-Oct-1940  3D Planning Note   Prostate Brachytherapy Post-Implant Dosimetry  Diagnosis: 78 y.o. gentleman with Stage T1c adenocarcinoma of the prostate with Gleason score of 4+4, and PSA of 7.05  Narrative: On a previous date, Alex Massey returned following prostate seed implantation for post implant planning. He underwent CT scan complex simulation to delineate the three-dimensional structures of the pelvis and demonstrate the radiation distribution.  Since that time, the seed localization, and complex isodose planning with dose volume histograms have now been completed.  Results:   Prostate Coverage - The dose of radiation delivered to the 90% or more of the prostate gland (D90) was 105.63% of the prescription dose. This exceeds our goal of greater than 90%. Rectal Sparing - The volume of rectal tissue receiving the prescription dose or higher was 0.01 cc. This falls under our thresholds tolerance of 1.0 cc.  Impression: The prostate seed implant appears to show adequate target coverage and appropriate rectal sparing.  Plan:  The patient will continue to follow with urology for ongoing PSA determinations. I would anticipate a high likelihood for local tumor control with minimal risk for rectal morbidity.  ________________________________  Sheral Apley Tammi Klippel, M.D.

## 2019-03-01 ENCOUNTER — Ambulatory Visit
Admission: RE | Admit: 2019-03-01 | Discharge: 2019-03-01 | Disposition: A | Discharge status: Home / Self Care | Payer: Medicare Other | Source: Ambulatory Visit | Attending: Radiation Oncology | Admitting: Radiation Oncology

## 2019-03-01 ENCOUNTER — Other Ambulatory Visit: Payer: Self-pay

## 2019-03-01 ENCOUNTER — Other Ambulatory Visit (INDEPENDENT_AMBULATORY_CARE_PROVIDER_SITE_OTHER): Payer: Self-pay | Admitting: Physical Medicine and Rehabilitation

## 2019-03-01 DIAGNOSIS — C61 Malignant neoplasm of prostate: Secondary | ICD-10-CM | POA: Diagnosis not present

## 2019-03-01 DIAGNOSIS — Z510 Encounter for antineoplastic radiation therapy: Secondary | ICD-10-CM | POA: Diagnosis not present

## 2019-03-02 ENCOUNTER — Ambulatory Visit
Admission: RE | Admit: 2019-03-02 | Discharge: 2019-03-02 | Disposition: A | Discharge status: Home / Self Care | Payer: Medicare Other | Source: Ambulatory Visit | Attending: Radiation Oncology | Admitting: Radiation Oncology

## 2019-03-02 ENCOUNTER — Other Ambulatory Visit: Payer: Self-pay

## 2019-03-02 DIAGNOSIS — C61 Malignant neoplasm of prostate: Secondary | ICD-10-CM | POA: Diagnosis not present

## 2019-03-02 DIAGNOSIS — Z510 Encounter for antineoplastic radiation therapy: Secondary | ICD-10-CM | POA: Diagnosis not present

## 2019-03-02 NOTE — Telephone Encounter (Signed)
Please advise 

## 2019-03-03 ENCOUNTER — Ambulatory Visit
Admission: RE | Admit: 2019-03-03 | Discharge: 2019-03-03 | Disposition: A | Discharge status: Home / Self Care | Payer: Medicare Other | Source: Ambulatory Visit | Attending: Radiation Oncology | Admitting: Radiation Oncology

## 2019-03-03 ENCOUNTER — Other Ambulatory Visit: Payer: Self-pay

## 2019-03-03 DIAGNOSIS — C61 Malignant neoplasm of prostate: Secondary | ICD-10-CM | POA: Diagnosis not present

## 2019-03-03 DIAGNOSIS — Z510 Encounter for antineoplastic radiation therapy: Secondary | ICD-10-CM | POA: Diagnosis not present

## 2019-03-04 ENCOUNTER — Other Ambulatory Visit: Payer: Self-pay

## 2019-03-04 ENCOUNTER — Ambulatory Visit
Admission: RE | Admit: 2019-03-04 | Discharge: 2019-03-04 | Disposition: A | Discharge status: Home / Self Care | Payer: Medicare Other | Source: Ambulatory Visit | Attending: Radiation Oncology | Admitting: Radiation Oncology

## 2019-03-04 DIAGNOSIS — Z510 Encounter for antineoplastic radiation therapy: Secondary | ICD-10-CM | POA: Diagnosis not present

## 2019-03-04 DIAGNOSIS — C61 Malignant neoplasm of prostate: Secondary | ICD-10-CM | POA: Diagnosis not present

## 2019-03-05 ENCOUNTER — Other Ambulatory Visit: Payer: Self-pay

## 2019-03-05 ENCOUNTER — Ambulatory Visit
Admission: RE | Admit: 2019-03-05 | Discharge: 2019-03-05 | Disposition: A | Discharge status: Home / Self Care | Payer: Medicare Other | Source: Ambulatory Visit | Attending: Radiation Oncology | Admitting: Radiation Oncology

## 2019-03-05 ENCOUNTER — Encounter: Payer: Self-pay | Admitting: Medical Oncology

## 2019-03-05 DIAGNOSIS — Z510 Encounter for antineoplastic radiation therapy: Secondary | ICD-10-CM | POA: Diagnosis not present

## 2019-03-05 DIAGNOSIS — C61 Malignant neoplasm of prostate: Secondary | ICD-10-CM | POA: Diagnosis not present

## 2019-03-05 DIAGNOSIS — Z5111 Encounter for antineoplastic chemotherapy: Secondary | ICD-10-CM | POA: Diagnosis not present

## 2019-03-05 NOTE — Progress Notes (Signed)
Mr. Mendicino states he has done well post seed implant and with radiation. His urinary symptoms are tolerable and looking forward to being finished. He states he is more fatigued as he nears end of treatment. We discussed how his urinary symptoms and fatigue will improve over the months following completion. I asked him to call me with questions or concerns.

## 2019-03-08 ENCOUNTER — Ambulatory Visit
Admission: RE | Admit: 2019-03-08 | Discharge: 2019-03-08 | Disposition: A | Discharge status: Home / Self Care | Payer: Medicare Other | Source: Ambulatory Visit | Attending: Radiation Oncology | Admitting: Radiation Oncology

## 2019-03-08 ENCOUNTER — Other Ambulatory Visit: Payer: Self-pay

## 2019-03-08 DIAGNOSIS — Z510 Encounter for antineoplastic radiation therapy: Secondary | ICD-10-CM | POA: Diagnosis not present

## 2019-03-08 DIAGNOSIS — C61 Malignant neoplasm of prostate: Secondary | ICD-10-CM | POA: Diagnosis not present

## 2019-03-09 ENCOUNTER — Ambulatory Visit
Admission: RE | Admit: 2019-03-09 | Discharge: 2019-03-09 | Disposition: A | Discharge status: Home / Self Care | Payer: Medicare Other | Source: Ambulatory Visit | Attending: Radiation Oncology | Admitting: Radiation Oncology

## 2019-03-09 ENCOUNTER — Other Ambulatory Visit: Payer: Self-pay

## 2019-03-09 DIAGNOSIS — Z510 Encounter for antineoplastic radiation therapy: Secondary | ICD-10-CM | POA: Diagnosis not present

## 2019-03-09 DIAGNOSIS — C61 Malignant neoplasm of prostate: Secondary | ICD-10-CM | POA: Diagnosis not present

## 2019-03-10 ENCOUNTER — Ambulatory Visit
Admission: RE | Admit: 2019-03-10 | Discharge: 2019-03-10 | Disposition: A | Discharge status: Home / Self Care | Payer: Medicare Other | Source: Ambulatory Visit | Attending: Radiation Oncology | Admitting: Radiation Oncology

## 2019-03-10 ENCOUNTER — Other Ambulatory Visit: Payer: Self-pay

## 2019-03-10 DIAGNOSIS — Z510 Encounter for antineoplastic radiation therapy: Secondary | ICD-10-CM | POA: Diagnosis not present

## 2019-03-10 DIAGNOSIS — H524 Presbyopia: Secondary | ICD-10-CM | POA: Diagnosis not present

## 2019-03-10 DIAGNOSIS — C61 Malignant neoplasm of prostate: Secondary | ICD-10-CM | POA: Diagnosis not present

## 2019-03-10 LAB — HM DIABETES EYE EXAM

## 2019-03-11 ENCOUNTER — Other Ambulatory Visit: Payer: Self-pay

## 2019-03-11 ENCOUNTER — Ambulatory Visit
Admission: RE | Admit: 2019-03-11 | Discharge: 2019-03-11 | Disposition: A | Discharge status: Home / Self Care | Payer: Medicare Other | Source: Ambulatory Visit | Attending: Radiation Oncology | Admitting: Radiation Oncology

## 2019-03-11 DIAGNOSIS — Z510 Encounter for antineoplastic radiation therapy: Secondary | ICD-10-CM | POA: Diagnosis not present

## 2019-03-11 DIAGNOSIS — C61 Malignant neoplasm of prostate: Secondary | ICD-10-CM | POA: Diagnosis not present

## 2019-03-12 ENCOUNTER — Other Ambulatory Visit: Payer: Self-pay | Admitting: Radiation Oncology

## 2019-03-12 ENCOUNTER — Other Ambulatory Visit: Payer: Self-pay

## 2019-03-12 ENCOUNTER — Ambulatory Visit
Admission: RE | Admit: 2019-03-12 | Discharge: 2019-03-12 | Disposition: A | Discharge status: Home / Self Care | Payer: Medicare Other | Source: Ambulatory Visit | Attending: Radiation Oncology | Admitting: Radiation Oncology

## 2019-03-12 DIAGNOSIS — C61 Malignant neoplasm of prostate: Secondary | ICD-10-CM | POA: Diagnosis not present

## 2019-03-12 DIAGNOSIS — Z510 Encounter for antineoplastic radiation therapy: Secondary | ICD-10-CM | POA: Diagnosis not present

## 2019-03-16 ENCOUNTER — Other Ambulatory Visit: Payer: Self-pay

## 2019-03-16 ENCOUNTER — Telehealth: Payer: Self-pay | Admitting: Internal Medicine

## 2019-03-16 ENCOUNTER — Ambulatory Visit
Admission: RE | Admit: 2019-03-16 | Discharge: 2019-03-16 | Disposition: A | Discharge status: Home / Self Care | Payer: Medicare Other | Source: Ambulatory Visit | Attending: Radiation Oncology | Admitting: Radiation Oncology

## 2019-03-16 DIAGNOSIS — Z510 Encounter for antineoplastic radiation therapy: Secondary | ICD-10-CM | POA: Diagnosis not present

## 2019-03-16 DIAGNOSIS — C61 Malignant neoplasm of prostate: Secondary | ICD-10-CM | POA: Diagnosis not present

## 2019-03-16 NOTE — Telephone Encounter (Signed)
Pt needs a refill of his LANCETS, please advise

## 2019-03-17 ENCOUNTER — Other Ambulatory Visit: Payer: Self-pay

## 2019-03-17 ENCOUNTER — Ambulatory Visit
Admission: RE | Admit: 2019-03-17 | Discharge: 2019-03-17 | Disposition: A | Discharge status: Home / Self Care | Payer: Medicare Other | Source: Ambulatory Visit | Attending: Radiation Oncology | Admitting: Radiation Oncology

## 2019-03-17 DIAGNOSIS — C61 Malignant neoplasm of prostate: Secondary | ICD-10-CM | POA: Diagnosis not present

## 2019-03-17 DIAGNOSIS — Z510 Encounter for antineoplastic radiation therapy: Secondary | ICD-10-CM | POA: Diagnosis not present

## 2019-03-17 MED ORDER — FREESTYLE LANCETS MISC: 3 refills | Status: DC

## 2019-03-17 NOTE — Addendum Note (Signed)
Addended by: Juliet Rude on: 03/17/2019 07:38 AM   Modules accepted: Orders

## 2019-03-18 ENCOUNTER — Ambulatory Visit
Admission: RE | Admit: 2019-03-18 | Discharge: 2019-03-18 | Disposition: A | Discharge status: Home / Self Care | Payer: Medicare Other | Source: Ambulatory Visit | Attending: Radiation Oncology | Admitting: Radiation Oncology

## 2019-03-18 ENCOUNTER — Other Ambulatory Visit: Payer: Self-pay

## 2019-03-18 DIAGNOSIS — C61 Malignant neoplasm of prostate: Secondary | ICD-10-CM | POA: Diagnosis not present

## 2019-03-18 DIAGNOSIS — Z510 Encounter for antineoplastic radiation therapy: Secondary | ICD-10-CM | POA: Diagnosis not present

## 2019-03-19 ENCOUNTER — Ambulatory Visit
Admission: RE | Admit: 2019-03-19 | Discharge: 2019-03-19 | Disposition: A | Discharge status: Home / Self Care | Payer: Medicare Other | Source: Ambulatory Visit | Attending: Radiation Oncology | Admitting: Radiation Oncology

## 2019-03-19 ENCOUNTER — Other Ambulatory Visit: Payer: Self-pay

## 2019-03-19 ENCOUNTER — Telehealth: Payer: Self-pay | Admitting: Internal Medicine

## 2019-03-19 DIAGNOSIS — Z510 Encounter for antineoplastic radiation therapy: Secondary | ICD-10-CM | POA: Diagnosis not present

## 2019-03-19 DIAGNOSIS — C61 Malignant neoplasm of prostate: Secondary | ICD-10-CM | POA: Diagnosis not present

## 2019-03-19 NOTE — Telephone Encounter (Signed)
Pt has been informed.

## 2019-03-19 NOTE — Telephone Encounter (Signed)
Patient called stating that he was recently taken off Flomax and has since then been urinating 10-12 times at night. He saw Dr Tammi Klippel who said that is why he was on Flomax and to check with Dr Jenny Reichmann to see why he was taken off of it.  Please advise.

## 2019-03-19 NOTE — Telephone Encounter (Signed)
Perhaps Dr Tammi Klippel is confused, since the chart record clearly indicates HE was the one who discontinued the flomax on Sept 4 2020  I decline to address further since I had nothing to do with this; please refer pt BACK to Dr Tammi Klippel

## 2019-03-22 ENCOUNTER — Other Ambulatory Visit: Payer: Self-pay

## 2019-03-22 ENCOUNTER — Ambulatory Visit
Admission: RE | Admit: 2019-03-22 | Discharge: 2019-03-22 | Disposition: A | Discharge status: Home / Self Care | Payer: Medicare Other | Source: Ambulatory Visit | Attending: Radiation Oncology | Admitting: Radiation Oncology

## 2019-03-22 DIAGNOSIS — C61 Malignant neoplasm of prostate: Secondary | ICD-10-CM | POA: Diagnosis not present

## 2019-03-22 DIAGNOSIS — Z510 Encounter for antineoplastic radiation therapy: Secondary | ICD-10-CM | POA: Diagnosis not present

## 2019-03-23 ENCOUNTER — Other Ambulatory Visit: Payer: Self-pay

## 2019-03-23 ENCOUNTER — Ambulatory Visit
Admission: RE | Admit: 2019-03-23 | Discharge: 2019-03-23 | Disposition: A | Discharge status: Home / Self Care | Payer: Medicare Other | Source: Ambulatory Visit | Attending: Radiation Oncology | Admitting: Radiation Oncology

## 2019-03-23 DIAGNOSIS — C61 Malignant neoplasm of prostate: Secondary | ICD-10-CM | POA: Diagnosis not present

## 2019-03-23 DIAGNOSIS — Z510 Encounter for antineoplastic radiation therapy: Secondary | ICD-10-CM | POA: Diagnosis not present

## 2019-03-24 ENCOUNTER — Ambulatory Visit
Admission: RE | Admit: 2019-03-24 | Discharge: 2019-03-24 | Disposition: A | Discharge status: Home / Self Care | Payer: Medicare Other | Source: Ambulatory Visit | Attending: Radiation Oncology | Admitting: Radiation Oncology

## 2019-03-24 ENCOUNTER — Other Ambulatory Visit: Payer: Self-pay

## 2019-03-24 DIAGNOSIS — Z510 Encounter for antineoplastic radiation therapy: Secondary | ICD-10-CM | POA: Diagnosis not present

## 2019-03-24 DIAGNOSIS — C61 Malignant neoplasm of prostate: Secondary | ICD-10-CM | POA: Diagnosis not present

## 2019-03-25 ENCOUNTER — Ambulatory Visit
Admission: RE | Admit: 2019-03-25 | Discharge: 2019-03-25 | Disposition: A | Discharge status: Home / Self Care | Payer: Medicare Other | Source: Ambulatory Visit | Attending: Radiation Oncology | Admitting: Radiation Oncology

## 2019-03-25 ENCOUNTER — Other Ambulatory Visit: Payer: Self-pay

## 2019-03-25 DIAGNOSIS — Z510 Encounter for antineoplastic radiation therapy: Secondary | ICD-10-CM | POA: Diagnosis not present

## 2019-03-25 DIAGNOSIS — C61 Malignant neoplasm of prostate: Secondary | ICD-10-CM | POA: Diagnosis not present

## 2019-03-26 ENCOUNTER — Ambulatory Visit
Admission: RE | Admit: 2019-03-26 | Discharge: 2019-03-26 | Disposition: A | Discharge status: Home / Self Care | Payer: Medicare Other | Source: Ambulatory Visit | Attending: Radiation Oncology | Admitting: Radiation Oncology

## 2019-03-26 ENCOUNTER — Other Ambulatory Visit: Payer: Self-pay

## 2019-03-26 DIAGNOSIS — Z510 Encounter for antineoplastic radiation therapy: Secondary | ICD-10-CM | POA: Diagnosis not present

## 2019-03-26 DIAGNOSIS — C61 Malignant neoplasm of prostate: Secondary | ICD-10-CM | POA: Diagnosis not present

## 2019-03-29 ENCOUNTER — Encounter: Payer: Self-pay | Admitting: Radiation Oncology

## 2019-03-29 ENCOUNTER — Ambulatory Visit
Admission: RE | Admit: 2019-03-29 | Discharge: 2019-03-29 | Disposition: A | Discharge status: Home / Self Care | Payer: Medicare Other | Source: Ambulatory Visit | Attending: Radiation Oncology | Admitting: Radiation Oncology

## 2019-03-29 ENCOUNTER — Other Ambulatory Visit: Payer: Self-pay

## 2019-03-29 DIAGNOSIS — Z510 Encounter for antineoplastic radiation therapy: Secondary | ICD-10-CM | POA: Diagnosis not present

## 2019-03-29 DIAGNOSIS — C61 Malignant neoplasm of prostate: Secondary | ICD-10-CM | POA: Diagnosis not present

## 2019-03-30 DIAGNOSIS — B078 Other viral warts: Secondary | ICD-10-CM | POA: Diagnosis not present

## 2019-04-15 DIAGNOSIS — B078 Other viral warts: Secondary | ICD-10-CM | POA: Diagnosis not present

## 2019-04-27 ENCOUNTER — Encounter (HOSPITAL_BASED_OUTPATIENT_CLINIC_OR_DEPARTMENT_OTHER): Payer: Self-pay | Admitting: Urology

## 2019-04-28 ENCOUNTER — Ambulatory Visit: Payer: Medicare Other | Admitting: Urology

## 2019-04-30 NOTE — Progress Notes (Signed)
  Radiation Oncology         [phone] ________________________________  Name: Alex Massey MRN: [id number]  Date: 03/29/2019  DOB: 1940-08-02  End of Treatment Note  Diagnosis:   78 y.o. gentleman with Stage T1c adenocarcinoma of the prostate with Gleason score of 4+4, and PSA of 7.05     Indication for treatment:  Curative, Definitive Radiotherapy       Radiation treatment dates:    1. 01/26/2019- brachytherapy boost 2. 02/22/2019 - 03/29/2019- prostate EBRT  Site/dose:  1. Radioactive seeds were implanted into the prostate for a total of 110 Gy. 2. The prostate, seminal vesicles, and pelvic lymph nodes were boosted with 45 Gy in 25 fractions of 1.8 Gy, for a total dose of 155 Gy  Beams/energy:  1. The radioactive seeds were delivered under sagittal guidance with 3D imaging. 2. The prostate, seminal vesicles, and pelvic lymph nodes were initially treated using helical intensity modulated radiotherapy delivering 6 megavolt photons. Image guidance was performed with megavoltage CT studies prior to each fraction. He was immobilized with a body fix lower extremity mold.   Narrative: The patient tolerated radiation treatment relatively well. During his course of daily treatments, he reported some dysuria, weak stream, feeling of not emptying his bladder, and nocturia up to x10-12 in the second half of treatments. He was prescribed Flomax with good relief of all symptoms. He also reported occasional constipation, diarrhea, and mild fatigue.  Plan: The patient has completed radiation treatment. He will be contacted in one month for follow up via telephone. I advised him to call or return sooner if he has any questions or concerns related to his recovery or treatment. ________________________________  Sheral Apley. Tammi Klippel, M.D.  This document serves as a record of services personally performed by [name], MD. It was created on his behalf by Wilburn Mylar, a trained medical  scribe. The creation of this record is based on the scribe's personal observations and the provider's statements to them. This document has been checked and approved by the attending provider.

## 2019-05-03 ENCOUNTER — Encounter: Payer: Self-pay | Admitting: Medical Oncology

## 2019-05-05 DIAGNOSIS — C61 Malignant neoplasm of prostate: Secondary | ICD-10-CM | POA: Diagnosis not present

## 2019-05-05 DIAGNOSIS — R351 Nocturia: Secondary | ICD-10-CM | POA: Diagnosis not present

## 2019-05-12 ENCOUNTER — Ambulatory Visit
Admission: RE | Admit: 2019-05-12 | Discharge: 2019-05-12 | Disposition: A | Discharge status: Home / Self Care | Payer: Medicare Other | Source: Ambulatory Visit | Attending: Urology | Admitting: Urology

## 2019-05-12 ENCOUNTER — Other Ambulatory Visit: Payer: Self-pay

## 2019-05-12 DIAGNOSIS — C61 Malignant neoplasm of prostate: Secondary | ICD-10-CM

## 2019-05-12 NOTE — Progress Notes (Signed)
Radiation Oncology         [phone] ________________________________  Name: Alex Massey MRN: [id number]  Date: 05/12/2019  DOB: 1940-11-14  Post Treatment Note  CC: Alex Borg, MD  Lance Sell, NP  Diagnosis:   78y.o. gentleman with Stage T1c adenocarcinoma of the prostate with Gleason score of 4+4, and PSA of 7.05     Interval Since Last Radiation:  6 weeks    02/22/2019 - 03/29/2019:  The patient received 45 Gy in 25 fractions of 1.8 Gy each, to supplement an up-front prostate seed implant boost of 110 Gy, performed 01/26/19, to achieve a total nominal dose of 165 Gy; concurrent with ADT  Narrative: I spoke with the patient to conduct his routine scheduled 1 month follow up visit via telephone to spare the patient unnecessary potential exposure in the healthcare setting during the current COVID-19 pandemic.  The patient was notified in advance and gave permission to proceed with this visit format. He tolerated radiation treatment relatively well. During his course of daily treatments, he reported some dysuria, weak stream, feeling of not emptying his bladder, and nocturia up to x10-12 in the second half of treatments, all of which were improved with the addition of Flomax.  He also reported occasional constipation, diarrhea, and mild fatigue.   He continued to tolerate his ADT well throughout treatment.                       On review of systems, the patient states that he is doing very well overall.  He reports gradual improvement in his LUTS and continues taking Flomax daily.  He specifically denies dysuria, gross hematuria, incomplete bladder emptying or incontinence.  He had a follow-up visit with Dr. Junious Silk at [facility] last week with a PSA drawn at that time but he has not been notified of those results as of yet.  He has continued to tolerate his ADT well and will be due for his next injection in February 2021.  He reports a healthy appetite and is  maintaining his weight.  He continues with mild fatigue but has been able to remain fairly active.  Overall, he is quite pleased with his progress to date.  ALLERGIES:  is allergic to metformin and related.  Meds: Current Outpatient Medications  Medication Sig Dispense Refill  . acarbose (PRECOSE) 50 MG tablet TAKE 1 TABLET BY MOUTH THREE TIMES DAILY WITH MEALS 270 tablet 1  . aspirin EC 81 MG tablet Take 1 tablet (81 mg total) by mouth daily. 150 tablet 2  . blood glucose meter kit and supplies KIT Inject 1 each into the skin 2 (two) times daily. Dispense based on patient and insurance preference. Use up to four times daily as directed. (FOR ICD-9 250.00, 250.01). 1 each 0  . Blood Glucose Monitoring Suppl (FREESTYLE LITE) DEVI USE TO CHECK GLUCOSE UP TO 4 TIMES DAILY AS DIRECTED    . gabapentin (NEURONTIN) 300 MG capsule Take 1 capsule (300 mg total) by mouth at bedtime. 90 capsule 4  . glipiZIDE (GLUCOTROL XL) 10 MG 24 hr tablet Take 1 tablet (10 mg total) by mouth daily with breakfast. (Patient taking differently: Take 10 mg by mouth daily with breakfast. ) 90 tablet 3  . glucose blood (FREESTYLE TEST STRIPS) test strip Use to check blood sugars twice a day Dx CODE: E11.9 150 each 5  . HYDROcodone-acetaminophen (NORCO/VICODIN) 5-325 MG tablet Take 1 tablet by mouth every 6 (  six) hours as needed for moderate pain. 30 tablet 0  . Lancets (FREESTYLE) lancets Use to check blood sugars two times daily. Dx Code: E11.9 200 each 3  . losartan (COZAAR) 50 MG tablet Take 1 tablet (50 mg total) by mouth daily. 90 tablet 1  . simvastatin (ZOCOR) 20 MG tablet TAKE 1 TABLET(20 MG) BY MOUTH AT BEDTIME 90 tablet 1  . sitaGLIPtin (JANUVIA) 100 MG tablet Take 1 tablet (100 mg total) by mouth daily. 90 tablet 1  . vitamin B-12 (CYANOCOBALAMIN) 1000 MCG tablet Take 1,000 mcg by mouth daily.     No current facility-administered medications for this encounter.     Physical Findings:  vitals were not taken for  this visit.   /Unable to assess due to telephone follow-up visit format.  Lab Findings: Lab Results  Component Value Date   WBC 5.1 01/22/2019   HGB 11.4 (L) 01/22/2019   HCT 37.2 (L) 01/22/2019   MCV 84.4 01/22/2019   PLT 122 (L) 01/22/2019     Radiographic Findings: No results found.  Impression/Plan: 1. 78y.o. gentleman with Stage T1c adenocarcinoma of the prostate with Gleason score of 4+4, and PSA of 7.05.       He will continue to follow up with urology for ongoing PSA determinations and has an appointment scheduled with Dr. Junious Silk in February 2021 when he will also be due for his next ADT injection. He understands what to expect with regards to PSA monitoring going forward. I will look forward to following his response to treatment via correspondence with urology, and would be happy to continue to participate in his care if clinically indicated. I talked to the patient about what to expect in the future, including his risk for erectile dysfunction and rectal bleeding. I encouraged him to call or return to the office if he has any questions regarding his previous radiation or possible radiation side effects. He was comfortable with this plan and will follow up as needed.    Nicholos Johns, PA-C

## 2019-05-17 DIAGNOSIS — B078 Other viral warts: Secondary | ICD-10-CM | POA: Diagnosis not present

## 2019-05-28 ENCOUNTER — Encounter: Payer: Self-pay | Admitting: Internal Medicine

## 2019-05-28 ENCOUNTER — Ambulatory Visit (INDEPENDENT_AMBULATORY_CARE_PROVIDER_SITE_OTHER): Payer: Medicare Other | Admitting: Internal Medicine

## 2019-05-28 ENCOUNTER — Other Ambulatory Visit: Payer: Self-pay

## 2019-05-28 VITALS — BP 116/78 | HR 69 | Temp 97.9°F | Ht 67.0 in | Wt 158.0 lb

## 2019-05-28 DIAGNOSIS — E119 Type 2 diabetes mellitus without complications: Secondary | ICD-10-CM

## 2019-05-28 DIAGNOSIS — I10 Essential (primary) hypertension: Secondary | ICD-10-CM

## 2019-05-28 DIAGNOSIS — E559 Vitamin D deficiency, unspecified: Secondary | ICD-10-CM

## 2019-05-28 DIAGNOSIS — E538 Deficiency of other specified B group vitamins: Secondary | ICD-10-CM

## 2019-05-28 DIAGNOSIS — Z0000 Encounter for general adult medical examination without abnormal findings: Secondary | ICD-10-CM

## 2019-05-28 DIAGNOSIS — E782 Mixed hyperlipidemia: Secondary | ICD-10-CM

## 2019-05-28 DIAGNOSIS — E611 Iron deficiency: Secondary | ICD-10-CM

## 2019-05-28 LAB — POCT GLYCOSYLATED HEMOGLOBIN (HGB A1C): Hemoglobin A1C: 6.2 % — AB (ref 4.0–5.6)

## 2019-05-28 MED ORDER — SITAGLIPTIN PHOSPHATE 100 MG PO TABS: 100.0000 mg | ORAL_TABLET | Freq: Every day | ORAL | 3 refills | Status: DC

## 2019-05-28 NOTE — Progress Notes (Addendum)
Subjective:    Patient ID: Alex Massey, male    DOB: Dec 11, 1940, 78 y.o.   MRN: [id number]  HPI  Here for wellness and f/u;  Overall doing ok;  Pt denies Chest pain, worsening SOB, DOE, wheezing, orthopnea, PND, worsening LE edema, palpitations, dizziness or syncope.  Pt denies neurological change such as new headache, facial or extremity weakness.  Pt denies polydipsia, polyuria, or low sugar symptoms. Pt states overall good compliance with treatment and medications, good tolerability, and has been trying to follow appropriate diet.  Pt denies worsening depressive symptoms, suicidal ideation or panic. No fever, night sweats, wt loss, loss of appetite, or other constitutional symptoms.  Pt states good ability with ADL's, has low fall risk, home safety reviewed and adequate, no other significant changes in hearing or vision, and only occasionally active with exercise.  S/p left CTS surgury, overall improving funciton. S/p xrt for prostate ca, had psa and UA about 2 wks ago, plan is for last hormone tx in feb 2021 then observe.  Now back to walking more whereas was not really able during his months of tx. Past Medical History:  Diagnosis Date  . Acquired right foot drop    post lumbar surgery,  wears brace  . B12 deficiency   . Chronic constipation    diet  . ED (erectile dysfunction)   . History of kidney stones   . Hyperlipidemia   . Hyperplasia of prostate with lower urinary tract symptoms (LUTS)   . Hypertension   . Insomnia   . Neuropathy, peripheral    right foot  . Presence of surgical incision 01/21/2019   s/p  left carpal tunnel release --  pt has dressing on,  denies pain or numbness  . Prostate cancer [facility]) urologist-- dr eskridge/  oncologist-  dr Tammi Klippel   dx 08-04-2018,  Stage T1c, Gleason 4+4  . Renal cyst, right   . Type 2 diabetes mellitus ([place])    followed by pcp  . Wears partial dentures    upper and lower   Past Surgical History:  Procedure Laterality Date  .  CARPAL TUNNEL RELEASE Bilateral left 01-21-2019;  right 1980s  . CYSTOSCOPY N/A 01/26/2019   Procedure: CYSTOSCOPY FLEXIBLE;  Surgeon: Festus Aloe, MD;  Location: [facility];  Service: Urology;  Laterality: N/A;  no seeds found in bladder  . LUMBAR DISC SURGERY  1984  L4-5;  1998 L4--S1  . LUMBAR EPIDURAL INJECTION  2009, 2006   dr newton  . LUMBAR MICRODISCECTOMY  05-13-2002  dr Jenean Lindau  @[facility]    L4-5  . RADIOACTIVE SEED IMPLANT N/A 01/26/2019   Procedure: RADIOACTIVE SEED IMPLANT/BRACHYTHERAPY IMPLANT;  Surgeon: Festus Aloe, MD;  Location: [facility];  Service: Urology;  Laterality: N/A;    54 seeds implanted  . SPACE OAR INSTILLATION N/A 01/26/2019   Procedure: SPACE OAR INSTILLATION;  Surgeon: Festus Aloe, MD;  Location: [facility];  Service: Urology;  Laterality: N/A;    reports that he quit smoking about 16 years ago. His smoking use included cigarettes. He quit after 20.00 years of use. He has never used smokeless tobacco. He reports current alcohol use of about 3.0 standard drinks of alcohol per week. He reports that he does not use drugs. family history includes Colon cancer (age of onset: 8) in his brother; Diabetes in his father; Healthy in his mother; Prostate cancer in his brother, cousin, and nephew. Allergies  Allergen Reactions  . Metformin And Related  Other (See Comments)    GI upset, diarrhea, wt loss   Review of Systems  Constitutional: Negative for other unusual diaphoresis or sweats HENT: Negative for ear discharge or swelling Eyes: Negative for other worsening visual disturbances Respiratory: Negative for stridor or other swelling  Gastrointestinal: Negative for worsening distension or other blood Genitourinary: Negative for retention or other urinary change Musculoskeletal: Negative for other MSK pain or swelling Skin: Negative for color change or other new lesions Neurological: Negative for worsening  tremors and other numbness  Psychiatric/Behavioral: Negative for worsening agitation or other fatigue All otherwise neg per pt     Objective:   Physical Exam BP 116/78   Pulse 69   Temp 97.9 F (36.6 C) (Oral)   Ht 5\' 7"  (1.702 m)   Wt 158 lb (71.7 kg)   SpO2 99%   BMI 24.75 kg/m  VS noted,  Constitutional: Pt appears in NAD HENT: Head: NCAT.  Right Ear: External ear normal.  Left Ear: External ear normal.  Eyes: . Pupils are equal, round, and reactive to light. Conjunctivae and EOM are normal Nose: without d/c or deformity Neck: Neck supple. Gross normal ROM Cardiovascular: Normal rate and regular rhythm.   Pulmonary/Chest: Effort normal and breath sounds without rales or wheezing.  Abd:  Soft, NT, ND, + BS, no organomegaly Neurological: Pt is alert. At baseline orientation, motor grossly intact Skin: Skin is warm. No rashes, other new lesions, no LE edema Psychiatric: Pt behavior is normal without agitation  All otherwise neg per pt  Lab Results  Component Value Date   WBC 5.1 01/22/2019   HGB 11.4 (L) 01/22/2019   HCT 37.2 (L) 01/22/2019   PLT 122 (L) 01/22/2019   GLUCOSE 204 (H) 01/22/2019   CHOL 175 11/24/2018   TRIG 176.0 (H) 11/24/2018   HDL 70.10 11/24/2018   LDLCALC 69 11/24/2018   ALT 27 01/22/2019   AST 20 01/22/2019   NA 137 01/22/2019   K 4.1 01/22/2019   CL 102 01/22/2019   CREATININE 0.78 01/22/2019   BUN 15 01/22/2019   CO2 23 01/22/2019   TSH 1.64 11/24/2018   PSA 4.41 (H) 02/05/2016   INR 1.0 01/22/2019   HGBA1C 8.0 (H) 11/24/2018   MICROALBUR <0.7 11/24/2018  Declines other lab f/u today POCT HgB A1C Order: [id number] Status:  Final result Visible to patient:  No (not released) Dx:  Type 2 diabetes mellitus without comp...  Ref Range & Units 09:53 (05/28/19) 23mo ago (11/24/18) 38mo ago (08/17/18) 7yr ago (02/13/18) 50yr ago (09/26/17) 61yr ago (06/13/17) 88yr ago (02/07/17)  Hemoglobin A1C 4.0 - 5.6 % 6.2Abnormal   8.0High  R, CM   7.9Abnormal   7.4High  R, CM  7.6High  R, CM  8.2High  R, CM  7.5High          This visit occurred during the SARS-CoV-2 public health emergency.  Safety protocols were in place, including screening questions prior to the visit, additional usage of staff PPE, and extensive cleaning of exam room while observing appropriate contact time as indicated for disinfecting solutions.       Assessment & Plan:

## 2019-05-28 NOTE — Patient Instructions (Signed)
Your a1c was OK today  Please continue all other medications as before, and refills have been done if requested.  Please have the pharmacy call with any other refills you may need.  Please continue your efforts at being more active, low cholesterol diet, and weight control.  You are otherwise up to date with prevention measures today.  Please keep your appointments with your specialists as you may have planned  Please return in 6 months, or sooner if needed, with Lab testing done 3-5 days before

## 2019-05-28 NOTE — Assessment & Plan Note (Signed)
stable overall by history and exam, recent data reviewed with pt, and pt to continue medical treatment as before,  to f/u any worsening symptoms or concerns  

## 2019-05-28 NOTE — Addendum Note (Signed)
Addended by: Juliet Rude on: 05/28/2019 10:07 AM   Modules accepted: Orders

## 2019-05-28 NOTE — Addendum Note (Signed)
Addended by: Biagio Borg on: 05/28/2019 09:55 AM   Modules accepted: Orders

## 2019-06-07 DIAGNOSIS — B078 Other viral warts: Secondary | ICD-10-CM | POA: Diagnosis not present

## 2019-06-07 DIAGNOSIS — L249 Irritant contact dermatitis, unspecified cause: Secondary | ICD-10-CM | POA: Diagnosis not present

## 2019-08-06 ENCOUNTER — Other Ambulatory Visit: Payer: Self-pay | Admitting: Internal Medicine

## 2019-08-09 DIAGNOSIS — B078 Other viral warts: Secondary | ICD-10-CM | POA: Diagnosis not present

## 2019-08-16 ENCOUNTER — Other Ambulatory Visit: Payer: Self-pay | Admitting: Internal Medicine

## 2019-08-16 MED ORDER — ACARBOSE 50 MG PO TABS: ORAL_TABLET | ORAL | 1 refills | Status: DC

## 2019-08-16 NOTE — Telephone Encounter (Signed)
    Patient has no medication remaining  Is medication on med list (if no, inform pt they may need an appointment): acarbose (PRECOSE) 50 MG tablet   Pharmacy (Name [facility], [place], [place]

## 2019-08-30 DIAGNOSIS — C61 Malignant neoplasm of prostate: Secondary | ICD-10-CM | POA: Diagnosis not present

## 2019-09-02 ENCOUNTER — Other Ambulatory Visit: Payer: Self-pay | Admitting: Internal Medicine

## 2019-09-06 ENCOUNTER — Telehealth: Payer: Self-pay

## 2019-09-06 DIAGNOSIS — C61 Malignant neoplasm of prostate: Secondary | ICD-10-CM | POA: Diagnosis not present

## 2019-09-06 DIAGNOSIS — N400 Enlarged prostate without lower urinary tract symptoms: Secondary | ICD-10-CM | POA: Diagnosis not present

## 2019-09-06 NOTE — Telephone Encounter (Signed)
New message   Patient does not understand why medication was sent to [facility].    1.Medication Requested:sitaGLIPtin (JANUVIA) 100 MG tablet  2. Pharmacy (Name, Street, [place]): [facility],   3. On Med List: Yes   4. Last Visit with PCP: 11.20.2020   5. Next visit date with PCP: 5.20.2021

## 2019-09-07 ENCOUNTER — Telehealth: Payer: Self-pay | Admitting: Physical Medicine and Rehabilitation

## 2019-09-07 ENCOUNTER — Telehealth: Payer: Self-pay

## 2019-09-07 MED ORDER — SITAGLIPTIN PHOSPHATE 100 MG PO TABS: 100.0000 mg | ORAL_TABLET | Freq: Every day | ORAL | 2 refills | Status: DC

## 2019-09-07 NOTE — Telephone Encounter (Signed)
erx resent to [facility]

## 2019-09-07 NOTE — Telephone Encounter (Signed)
Ov

## 2019-09-07 NOTE — Telephone Encounter (Signed)
Patient calling and states that he had brought some patient assistance paperwork up to the office about 3 weeks ago and Dr Jenny Reichmann was supposed to sign. States that he did sign, but his number was missing above his signature. Patient states that his Celesta Gentile goes to [employer] and he is unable to get this for free at the [employer]. Would like a call with an update about paperwork being updates and resent. Please advise.

## 2019-09-08 ENCOUNTER — Other Ambulatory Visit: Payer: Self-pay

## 2019-09-08 ENCOUNTER — Telehealth: Payer: Self-pay

## 2019-09-08 DIAGNOSIS — E118 Type 2 diabetes mellitus with unspecified complications: Secondary | ICD-10-CM

## 2019-09-08 MED ORDER — SITAGLIPTIN PHOSPHATE 100 MG PO TABS: 100.0000 mg | ORAL_TABLET | Freq: Every day | ORAL | 3 refills | Status: DC

## 2019-09-08 NOTE — Telephone Encounter (Signed)
Scheduled for 3/17 at 0830.

## 2019-09-08 NOTE — Telephone Encounter (Signed)
Pt advised that paperwork is going to be updated and resent. He verbalized understanding.

## 2019-09-08 NOTE — Telephone Encounter (Signed)
Printed prescription for patient assistance form.

## 2019-09-08 NOTE — Telephone Encounter (Signed)
Lvm for patient to come in and sign his updated [employer] Patient Assistance paperwork.

## 2019-09-20 ENCOUNTER — Telehealth: Payer: Self-pay | Admitting: Internal Medicine

## 2019-09-20 MED ORDER — SIMVASTATIN 20 MG PO TABS: ORAL_TABLET | ORAL | 1 refills | Status: DC

## 2019-09-20 NOTE — Telephone Encounter (Signed)
Refill sent to [facility] on [address]

## 2019-09-20 NOTE — Telephone Encounter (Signed)
    1.Medication Requested:simvastatin (ZOCOR) 20 MG tablet  2. Pharmacy (Name, Street, [place]): [facility]  3. On Med List: yes  4. Last Visit with PCP: 05/28/19  5. Next visit date with PCP: 11/25/19   Agent: Please be advised that RX refills may take up to 3 business days. We ask that you follow-up with your pharmacy.

## 2019-09-22 ENCOUNTER — Encounter: Payer: Self-pay | Admitting: Physical Medicine and Rehabilitation

## 2019-09-22 ENCOUNTER — Telehealth: Payer: Self-pay | Admitting: *Deleted

## 2019-09-22 ENCOUNTER — Ambulatory Visit (INDEPENDENT_AMBULATORY_CARE_PROVIDER_SITE_OTHER): Payer: Medicare Other | Admitting: Physical Medicine and Rehabilitation

## 2019-09-22 ENCOUNTER — Other Ambulatory Visit: Payer: Self-pay

## 2019-09-22 VITALS — BP 154/82 | HR 79

## 2019-09-22 DIAGNOSIS — R202 Paresthesia of skin: Secondary | ICD-10-CM | POA: Diagnosis not present

## 2019-09-22 DIAGNOSIS — M5441 Lumbago with sciatica, right side: Secondary | ICD-10-CM | POA: Diagnosis not present

## 2019-09-22 DIAGNOSIS — G8929 Other chronic pain: Secondary | ICD-10-CM

## 2019-09-22 DIAGNOSIS — M79671 Pain in right foot: Secondary | ICD-10-CM

## 2019-09-22 DIAGNOSIS — M5116 Intervertebral disc disorders with radiculopathy, lumbar region: Secondary | ICD-10-CM

## 2019-09-22 NOTE — Progress Notes (Signed)
  Numeric Pain Rating Scale and Functional Assessment Average Pain 8 Pain Right Now 2 My pain is tingling and aching Pain is worse with: some activites Pain improves with: rest   In the last MONTH (on 0-10 scale) has pain interfered with the following?  1. General activity like being  able to carry out your everyday physical activities such as walking, climbing stairs, carrying groceries, or moving a chair?  Rating(9)  2. Relation with others like being able to carry out your usual social activities and roles such as  activities at home, at work and in your community. Rating(9)  3. Enjoyment of life such that you have  been bothered by emotional problems such as feeling anxious, depressed or irritable?  Rating(9)

## 2019-09-22 NOTE — Telephone Encounter (Signed)
Per [employer] online portal no pa is needed for [id number].  

## 2019-09-23 ENCOUNTER — Other Ambulatory Visit: Payer: Self-pay | Admitting: Radiation Oncology

## 2019-09-29 ENCOUNTER — Ambulatory Visit (INDEPENDENT_AMBULATORY_CARE_PROVIDER_SITE_OTHER): Payer: Medicare Other | Admitting: Physical Medicine and Rehabilitation

## 2019-09-29 ENCOUNTER — Other Ambulatory Visit: Payer: Self-pay

## 2019-09-29 ENCOUNTER — Ambulatory Visit: Payer: Self-pay

## 2019-09-29 ENCOUNTER — Encounter: Payer: Self-pay | Admitting: Physical Medicine and Rehabilitation

## 2019-09-29 VITALS — BP 154/81 | HR 74

## 2019-09-29 DIAGNOSIS — M5416 Radiculopathy, lumbar region: Secondary | ICD-10-CM

## 2019-09-29 DIAGNOSIS — M5116 Intervertebral disc disorders with radiculopathy, lumbar region: Secondary | ICD-10-CM | POA: Diagnosis not present

## 2019-09-29 MED ORDER — METHYLPREDNISOLONE ACETATE 80 MG/ML IJ SUSP
40.0000 mg | Freq: Once | INTRAMUSCULAR | Status: AC
  Administered 2019-09-29: 40 mg

## 2019-09-29 NOTE — Progress Notes (Signed)
 .  Numeric Pain Rating Scale and Functional Assessment Average Pain 8   In the last MONTH (on 0-10 scale) has pain interfered with the following?  1. General activity like being  able to carry out your everyday physical activities such as walking, climbing stairs, carrying groceries, or moving a chair?  Rating(8)   +Driver, -BT, -Dye Allergies.  

## 2019-09-29 NOTE — Procedures (Signed)
S1 Lumbosacral Transforaminal Epidural Steroid Injection - Sub-Pedicular Approach with Fluoroscopic Guidance   Patient: Alex Massey      Date of Birth: 21-Apr-1941 MRN: [id number] PCP: Biagio Borg, MD      Visit Date: 09/29/2019   Universal Protocol:    Date/Time: 03/24/211:03 PM  Consent Given By: the patient  Position:  PRONE  Additional Comments: Vital signs were monitored before and after the procedure. Patient was prepped and draped in the usual sterile fashion. The correct patient, procedure, and site was verified.   Injection Procedure Details:  Procedure Site One Meds Administered:  Meds ordered this encounter  Medications  . methylPREDNISolone acetate (DEPO-MEDROL) injection 40 mg    Laterality: Right  Location/Site:  S1 Foramen   Needle size: 22 ga.  Needle type: Spinal  Needle Placement: Transforaminal  Findings:   -Comments: Excellent flow of contrast along the nerve and into the epidural space.  Epidurogram: Contrast epidurogram showed no nerve root cut off or restricted flow pattern.  Procedure Details: After squaring off the sacral end-plate to get a true AP view, the C-arm was positioned so that the best possible view of the S1 foramen was visualized. The soft tissues overlying this structure were infiltrated with 2-3 ml. of 1% Lidocaine without Epinephrine.    The spinal needle was inserted toward the target using a "trajectory" view along the fluoroscope beam.  Under AP and lateral visualization, the needle was advanced so it did not puncture dura. Biplanar projections were used to confirm position. Aspiration was confirmed to be negative for CSF and/or blood. A 1-2 ml. volume of Isovue-250 was injected and flow of contrast was noted at each level. Radiographs were obtained for documentation purposes.   After attaining the desired flow of contrast documented above, a 0.5 to 1.0 ml test dose of 0.25% Marcaine was injected into each respective  transforaminal space.  The patient was observed for 90 seconds post injection.  After no sensory deficits were reported, and normal lower extremity motor function was noted,   the above injectate was administered so that equal amounts of the injectate were placed at each foramen (level) into the transforaminal epidural space.   Additional Comments:  The patient tolerated the procedure well Dressing: Band-Aid with 2 x 2 sterile gauze    Post-procedure details: Patient was observed during the procedure. Post-procedure instructions were reviewed.  Patient left the clinic in stable condition.

## 2019-09-29 NOTE — Progress Notes (Signed)
Alex Massey - 79 y.o. male MRN [id number]  Date of birth: 10/05/1940  Office Visit Note: Visit Date: 09/29/2019 PCP: Biagio Borg, MD Referred by: Biagio Borg, MD  Subjective: Chief Complaint  Patient presents with  . Lower Back - Pain  . Right Leg - Pain  . Right Foot - Pain, Numbness   HPI:  Alex Massey is a 79 y.o. male who comes in today For planned right S1 transforaminal dural steroid injection.  The patient has failed conservative care including home exercise, medications, time and activity modification.  This injection will be diagnostic and hopefully therapeutic.  Please see requesting physician notes for further details and justification.   ROS Otherwise per HPI.  Assessment & Plan: Visit Diagnoses:  1. Lumbar radiculopathy   2. Radiculopathy due to lumbar intervertebral disc disorder     Plan: No additional findings.   Meds & Orders:  Meds ordered this encounter  Medications  . methylPREDNISolone acetate (DEPO-MEDROL) injection 40 mg    Orders Placed This Encounter  Procedures  . XR C-ARM NO REPORT  . Epidural Steroid injection    Follow-up: Return if symptoms worsen or fail to improve.   Procedures: No procedures performed  S1 Lumbosacral Transforaminal Epidural Steroid Injection - Sub-Pedicular Approach with Fluoroscopic Guidance   Patient: Alex Massey      Date of Birth: Oct 10, 1940 MRN: [id number] PCP: Biagio Borg, MD      Visit Date: 09/29/2019   Universal Protocol:    Date/Time: 03/24/211:03 PM  Consent Given By: the patient  Position:  PRONE  Additional Comments: Vital signs were monitored before and after the procedure. Patient was prepped and draped in the usual sterile fashion. The correct patient, procedure, and site was verified.   Injection Procedure Details:  Procedure Site One Meds Administered:  Meds ordered this encounter  Medications  . methylPREDNISolone acetate (DEPO-MEDROL) injection 40 mg     Laterality: Right  Location/Site:  S1 Foramen   Needle size: 22 ga.  Needle type: Spinal  Needle Placement: Transforaminal  Findings:   -Comments: Excellent flow of contrast along the nerve and into the epidural space.  Epidurogram: Contrast epidurogram showed no nerve root cut off or restricted flow pattern.  Procedure Details: After squaring off the sacral end-plate to get a true AP view, the C-arm was positioned so that the best possible view of the S1 foramen was visualized. The soft tissues overlying this structure were infiltrated with 2-3 ml. of 1% Lidocaine without Epinephrine.    The spinal needle was inserted toward the target using a "trajectory" view along the fluoroscope beam.  Under AP and lateral visualization, the needle was advanced so it did not puncture dura. Biplanar projections were used to confirm position. Aspiration was confirmed to be negative for CSF and/or blood. A 1-2 ml. volume of Isovue-250 was injected and flow of contrast was noted at each level. Radiographs were obtained for documentation purposes.   After attaining the desired flow of contrast documented above, a 0.5 to 1.0 ml test dose of 0.25% Marcaine was injected into each respective transforaminal space.  The patient was observed for 90 seconds post injection.  After no sensory deficits were reported, and normal lower extremity motor function was noted,   the above injectate was administered so that equal amounts of the injectate were placed at each foramen (level) into the transforaminal epidural space.   Additional Comments:  The patient tolerated the procedure well Dressing: Band-Aid with  2 x 2 sterile gauze    Post-procedure details: Patient was observed during the procedure. Post-procedure instructions were reviewed.  Patient left the clinic in stable condition.     Clinical History: MRI LUMBAR SPINE WITHOUT CONTRAST  TECHNIQUE: Multiplanar, multisequence MR imaging of the  lumbar spine was performed. No intravenous contrast was administered.  COMPARISON:  08/02/2007  FINDINGS: Straightening of the normal lumbar lordosis is unchanged. There is no significant listhesis. Vertebral body heights are preserved. There is disc desiccation and severe disc space narrowing at L5-S1 with mild narrowing at L4-5. Mild degenerative marrow changes are present at L5-S1. Subcentimeter T1 hypointense focus in the posterior L3 vertebral body is unchanged and likely benign. Conus medullaris is normal in signal and terminates at L1. Paraspinal soft tissues are unremarkable.  L1-2:  Negative.  L2-3:  Mild disc bulging without stenosis, unchanged.  L3-4: Mild disc bulging and mild facet and ligamentum flavum hypertrophy result in mild right neural foraminal stenosis, minimally increased from prior.  L4-5: Prior right hemilaminectomy. Circumferential disc bulging slightly asymmetric to the right is again seen with suggestion of an increased although small right subarticular disc extrusion which may affect the right L5 nerve root in the lateral recess. A componenet of postoperative change in this region is also possible. There is no spinal stenosis. Circumferential disc bulging results in mild to moderate right greater than left neural foraminal stenosis, unchanged.  L5-S1: Prior right hemilaminectomy. Mild circumferential disc bulging results in moderate left neural foraminal stenosis, unchanged. No spinal stenosis.  IMPRESSION: 1. Possible small right subarticular disc extrusion at L4-5 potentially affecting the right L5 nerve root. Some of this appearance could be postoperative in nature. Postcontrast imaging might be helpful for further differentiation. 2. Unchanged mild to moderate neural foraminal stenosis at L4-5 and moderate left neural foraminal stenosis at L5-S1.   Electronically Signed   By: Logan Bores   On: 07/17/2014 17:43      Objective:  VS:  HT:    WT:   BMI:     BP:(!) 154/81  HR:74bpm  TEMP: ( )  RESP:  Physical Exam  Ortho Exam Imaging: No results found.

## 2019-10-05 ENCOUNTER — Encounter: Payer: Medicare Other | Admitting: Physical Medicine and Rehabilitation

## 2019-10-12 ENCOUNTER — Encounter: Payer: Self-pay | Admitting: Physical Medicine and Rehabilitation

## 2019-10-12 NOTE — Progress Notes (Signed)
Alex Massey - 79 y.o. male MRN [id number]  Date of birth: 1941-04-08  Office Visit Note: Visit Date: 09/22/2019 PCP: Biagio Borg, MD Referred by: Biagio Borg, MD  Subjective: Chief Complaint  Patient presents with  . Right Foot - Pain, Numbness  . Right Hip - Pain, Numbness  . Right Leg - Pain, Numbness   HPI: Alex Massey is a 79 y.o. male who comes in today For evaluation and management of 2 distinct problems 1 is chronic low back pain with right hip and leg pain paresthesia.  This is been ongoing chronic situation for him with exacerbation over the last several months.  No specific trauma.  He is typically followed from an orthopedic standpoint by Dr. Jean Rosenthal.  We have completed epidural injection in the past for disc herniation that seems to flareup intermittently.  He reports pain in the low back down the right hip and leg with numbness.  No weakness.  He reports 8 out of 10 on the pain scale with standing and walking.  He gets some relief with rest.  He has an aching type tooth achy type pain in the leg.  No left-sided complaints.  Some back pain with standing.  He also has a different problem today that I typically hear from him which is right foot pain.  Again about 6 months worth of right foot pain.  He is not reporting left foot pain or real numbness in the foot.  Foot pain keeps him up at night seems to be constant but is giving him some difficulty driving.  He is taking 300 mg of gabapentin at night but not only does not feel like it helps.  The last time he saw Dr. Ninfa Linden was in June of last year and they were looking at carpal tunnel issues in his hands.  I actually saw him for nerve conduction study at that point.  His case is complicated by diabetes.  Review of Systems  Musculoskeletal: Positive for back pain and joint pain.  Neurological: Positive for tingling.  All other systems reviewed and are negative.  Otherwise per HPI.  Assessment &  Plan: Visit Diagnoses:  1. Radiculopathy due to lumbar intervertebral disc disorder   2. Chronic bilateral low back pain with right-sided sciatica   3. Paresthesia of skin   4. Pain in right foot     Plan: Findings:  1.  Chronic history of back pain with radicular pain with exacerbations.  Current exacerbation over the last several months with severe pain 8 out of 10 not relieved with medications including small amount of Norco.  Paresthesias down the back of the leg as well.  No new trauma etc.  I think this is a reexacerbation of the problem he has had with a lumbar disc herniation at L5-S1.  I think the best approach is diagnostic and hopefully therapeutic S1 transforaminal epidural steroid injection on the right.  It is hard to know if his foot problem is related to his back or not.  2.  Right foot pain with difficulty driving ongoing now for 6 months without relief.  We will see after the diagnostic epidural injection how much of this was related to the spine itself.  If he still having foot pain but everything else is a lot better than not having follow-up with Dr. Jean Rosenthal for evaluation of his right foot.  Exam is really nonfocal with no strength loss no pain over the plantar fascia.  Meds & Orders: No orders of the defined types were placed in this encounter.  No orders of the defined types were placed in this encounter.   Follow-up: Return for Right S1 transforaminal epidural steroid injection.   Procedures: No procedures performed  No notes on file   Clinical History: MRI LUMBAR SPINE WITHOUT CONTRAST  TECHNIQUE: Multiplanar, multisequence MR imaging of the lumbar spine was performed. No intravenous contrast was administered.  COMPARISON:  08/02/2007  FINDINGS: Straightening of the normal lumbar lordosis is unchanged. There is no significant listhesis. Vertebral body heights are preserved. There is disc desiccation and severe disc space narrowing at  L5-S1 with mild narrowing at L4-5. Mild degenerative marrow changes are present at L5-S1. Subcentimeter T1 hypointense focus in the posterior L3 vertebral body is unchanged and likely benign. Conus medullaris is normal in signal and terminates at L1. Paraspinal soft tissues are unremarkable.  L1-2:  Negative.  L2-3:  Mild disc bulging without stenosis, unchanged.  L3-4: Mild disc bulging and mild facet and ligamentum flavum hypertrophy result in mild right neural foraminal stenosis, minimally increased from prior.  L4-5: Prior right hemilaminectomy. Circumferential disc bulging slightly asymmetric to the right is again seen with suggestion of an increased although small right subarticular disc extrusion which may affect the right L5 nerve root in the lateral recess. A componenet of postoperative change in this region is also possible. There is no spinal stenosis. Circumferential disc bulging results in mild to moderate right greater than left neural foraminal stenosis, unchanged.  L5-S1: Prior right hemilaminectomy. Mild circumferential disc bulging results in moderate left neural foraminal stenosis, unchanged. No spinal stenosis.  IMPRESSION: 1. Possible small right subarticular disc extrusion at L4-5 potentially affecting the right L5 nerve root. Some of this appearance could be postoperative in nature. Postcontrast imaging might be helpful for further differentiation. 2. Unchanged mild to moderate neural foraminal stenosis at L4-5 and moderate left neural foraminal stenosis at L5-S1.   Electronically Signed   By: Logan Bores   On: 07/17/2014 17:43   He reports that he quit smoking about 16 years ago. His smoking use included cigarettes. He quit after 20.00 years of use. He has never used smokeless tobacco.  Recent Labs    11/24/18 1346 05/28/19 0953  HGBA1C 8.0* 6.2*    Objective:  VS:  HT:    WT:   BMI:     BP:(!) 154/82  HR:79bpm  TEMP: ( )  RESP:   Physical Exam Constitutional:      General: He is not in acute distress.    Appearance: Normal appearance. He is not ill-appearing.  HENT:     Head: Normocephalic and atraumatic.     Right Ear: External ear normal.     Left Ear: External ear normal.  Eyes:     Extraocular Movements: Extraocular movements intact.  Cardiovascular:     Rate and Rhythm: Normal rate.     Pulses: Normal pulses.  Abdominal:     General: There is no distension.     Palpations: Abdomen is soft.  Musculoskeletal:        General: Tenderness present. No deformity or signs of injury.     Right lower leg: No edema.     Left lower leg: No edema.     Comments: Patient ambulates without aid he is somewhat slow to rise from a seated position to full extension he does have some pain with extension and facet loading of the lumbar spine and this does not  reproduce his leg pain.  He has a positive slump test on the right and no pain with hip rotation bilaterally.  Good distal strength.  No pain over the plantar fascia.  Skin:    Findings: No erythema or rash.  Neurological:     General: No focal deficit present.     Mental Status: He is alert and oriented to person, place, and time.     Sensory: No sensory deficit.     Motor: No weakness or abnormal muscle tone.     Coordination: Coordination normal.     Gait: Gait normal.  Psychiatric:        Mood and Affect: Mood normal.        Behavior: Behavior normal.     Ortho Exam Imaging: No results found.  Past Medical/Family/Surgical/Social History: Medications & Allergies reviewed per EMR, new medications updated. Patient Active Problem List   Diagnosis Date Noted  . Status post carpal tunnel release 02/04/2019  . Left carpal tunnel syndrome 11/29/2018  . Malignant neoplasm of prostate ([place]) 09/03/2018  . Vitamin B12 deficiency 02/13/2018  . Lumbar radiculopathy 07/09/2016  . Radiculopathy due to lumbar intervertebral disc disorder 07/09/2016  . Hemorrhoid  08/07/2015  . BPH without obstruction/lower urinary tract symptoms 02/01/2015  . Essential hypertension 09/12/2014  . Routine general medical examination at a health care facility 01/14/2011  . ERECTILE DYSFUNCTION 04/04/2008  . Hyperlipidemia 06/26/2007  . Diabetes ([place]) 06/25/2007  . NEPHROLITHIASIS, HX OF 06/25/2007   Past Medical History:  Diagnosis Date  . Acquired right foot drop    post lumbar surgery,  wears brace  . B12 deficiency   . Chronic constipation    diet  . ED (erectile dysfunction)   . History of kidney stones   . Hyperlipidemia   . Hyperplasia of prostate with lower urinary tract symptoms (LUTS)   . Hypertension   . Insomnia   . Neuropathy, peripheral    right foot  . Presence of surgical incision 01/21/2019   s/p  left carpal tunnel release --  pt has dressing on,  denies pain or numbness  . Prostate cancer [facility]) urologist-- dr eskridge/  oncologist-  dr Tammi Klippel   dx 08-04-2018,  Stage T1c, Gleason 4+4  . Renal cyst, right   . Type 2 diabetes mellitus ([place])    followed by pcp  . Wears partial dentures    upper and lower   Family History  Problem Relation Age of Onset  . Healthy Mother   . Prostate cancer Brother   . Diabetes Father   . Colon cancer Brother 69  . Prostate cancer Cousin        maternal  . Prostate cancer Nephew    Past Surgical History:  Procedure Laterality Date  . CARPAL TUNNEL RELEASE Bilateral left 01-21-2019;  right 1980s  . CYSTOSCOPY N/A 01/26/2019   Procedure: CYSTOSCOPY FLEXIBLE;  Surgeon: Festus Aloe, MD;  Location: [facility];  Service: Urology;  Laterality: N/A;  no seeds found in bladder  . LUMBAR DISC SURGERY  1984  L4-5;  1998 L4--S1  . LUMBAR EPIDURAL INJECTION  2009, 2006   dr Ardythe Klute  . LUMBAR MICRODISCECTOMY  05-13-2002  dr Jenean Lindau  @[facility]    L4-5  . RADIOACTIVE SEED IMPLANT N/A 01/26/2019   Procedure: RADIOACTIVE SEED IMPLANT/BRACHYTHERAPY IMPLANT;  Surgeon: Festus Aloe, MD;  Location:  [facility];  Service: Urology;  Laterality: N/A;    54 seeds implanted  . SPACE OAR INSTILLATION N/A 01/26/2019  Procedure: SPACE OAR INSTILLATION;  Surgeon: Festus Aloe, MD;  Location: [facility];  Service: Urology;  Laterality: N/A;   Social History   Occupational History    Comment: retired  Tobacco Use  . Smoking status: Former Smoker    Years: 20.00    Types: Cigarettes    Quit date: 01/25/2003    Years since quitting: 16.7  . Smokeless tobacco: Never Used  Substance and Sexual Activity  . Alcohol use: Yes    Alcohol/week: 3.0 standard drinks    Types: 3 Glasses of wine per week  . Drug use: Never  . Sexual activity: Not on file

## 2019-10-25 ENCOUNTER — Other Ambulatory Visit: Payer: Self-pay

## 2019-10-25 ENCOUNTER — Ambulatory Visit: Payer: Self-pay

## 2019-10-25 ENCOUNTER — Telehealth: Payer: Self-pay | Admitting: Internal Medicine

## 2019-10-25 ENCOUNTER — Ambulatory Visit (INDEPENDENT_AMBULATORY_CARE_PROVIDER_SITE_OTHER): Payer: Medicare Other | Admitting: Orthopaedic Surgery

## 2019-10-25 ENCOUNTER — Encounter: Payer: Self-pay | Admitting: Orthopaedic Surgery

## 2019-10-25 DIAGNOSIS — M25562 Pain in left knee: Secondary | ICD-10-CM | POA: Diagnosis not present

## 2019-10-25 MED ORDER — METHYLPREDNISOLONE ACETATE 40 MG/ML IJ SUSP
40.0000 mg | INTRAMUSCULAR | Status: AC | PRN
  Administered 2019-10-25: 40 mg via INTRA_ARTICULAR

## 2019-10-25 MED ORDER — LIDOCAINE HCL 1 % IJ SOLN
3.0000 mL | INTRAMUSCULAR | Status: AC | PRN
  Administered 2019-10-25: 3 mL

## 2019-10-25 MED ORDER — FREESTYLE TEST VI STRP: ORAL_STRIP | 1 refills | Status: DC

## 2019-10-25 MED ORDER — MELOXICAM 15 MG PO TABS: 15.0000 mg | ORAL_TABLET | Freq: Every day | ORAL | 3 refills | Status: DC

## 2019-10-25 NOTE — Progress Notes (Signed)
Office Visit Note   Patient: Alex Massey           Date of Birth: 1941/06/20           MRN: [id number] Visit Date: 10/25/2019              Requested by: Biagio Borg, MD [place],  [place] [id number] PCP: Biagio Borg, MD   Assessment & Plan: Visit Diagnoses:  1. Left knee pain, unspecified chronicity     Plan: I was able to easily aspirate about 50 cc of fluid from the knee which was consistent with arthritic synovial fluid. I then placed a steroid injection in his left knee that he tolerated very well and felt much better afterwards., Sent in some meloxicam for inflammation and arthritis pain. He states that he will follow-up as needed and if anything gets worse he will come and find the let us know and then we will treat him accordingly. All questions and concerns were answered addressed. Follow-up is as needed.  Follow-Up Instructions: Return if symptoms worsen or fail to improve.   Orders:  Orders Placed This Encounter  Procedures  . Large Joint Inj  . XR Knee 1-2 Views Left   Meds ordered this encounter  Medications  . meloxicam (MOBIC) 15 MG tablet    Sig: Take 1 tablet (15 mg total) by mouth daily.    Dispense:  30 tablet    Refill:  3      Procedures: Large Joint Inj: L knee on 10/25/2019 1:23 PM Indications: diagnostic evaluation and pain Details: 22 G 1.5 in needle, superolateral approach  Arthrogram: No  Medications: 3 mL lidocaine 1 %; 40 mg methylPREDNISolone acetate 40 MG/ML Outcome: tolerated well, no immediate complications Procedure, treatment alternatives, risks and benefits explained, specific risks discussed. Consent was given by the patient. Immediately prior to procedure a time out was called to verify the correct patient, procedure, equipment, support staff and site/side marked as required. Patient was prepped and draped in the usual sterile fashion.       Clinical Data: No additional findings.   Subjective: No  chief complaint on file. Patient comes in today with left knee pain and swelling.  He is 79 years old and active.  He states that the fluid builds up on his knee and then goes back down.  He says it has been walking a distance the knee is very painful to bend and is getting more painful for him.  He does report a remote history of gout when he was much younger.  He is also reporting some pain in his right elbow and stiffness.  He is hoping to have some type of medication for arthritis pain.  He denies any recent injuries.  He denies any acute change in medical status.  He has had his COVID-19 vaccination.  HPI  Review of Systems He currently denies any headache, chest pain, shortness of breath, fever, chills, nausea, vomiting  Objective: Vital Signs: There were no vitals taken for this visit.  Physical Exam He is alert and oriented x3 and in no acute distress Ortho Exam Examination of his left knee does show a moderate knee joint effusion.  The knee is slightly warm but not red.  It is ligamentously stable with good range of motion but his pain is noted past 90 degrees of flexion of the left knee.  His right knee exam is entirely normal.  There is stiffness with flexion extension  of his left elbow that is deep and this is the same with his right elbow.  The pain is just mild.  There is no ligamentous instability of either elbow. Specialty Comments:  No specialty comments available.  Imaging: XR Knee 1-2 Views Left  Result Date: 10/25/2019 2 views of the left knee show no acute findings.  The joint space is still well-maintained but there is calcifications around both meniscus suggesting either a calcium deposition type of disease or other arthritic process.    PMFS History: Patient Active Problem List   Diagnosis Date Noted  . Status post carpal tunnel release 02/04/2019  . Left carpal tunnel syndrome 11/29/2018  . Malignant neoplasm of prostate ([place]) 09/03/2018  . Vitamin B12  deficiency 02/13/2018  . Lumbar radiculopathy 07/09/2016  . Radiculopathy due to lumbar intervertebral disc disorder 07/09/2016  . Hemorrhoid 08/07/2015  . BPH without obstruction/lower urinary tract symptoms 02/01/2015  . Essential hypertension 09/12/2014  . Routine general medical examination at a health care facility 01/14/2011  . ERECTILE DYSFUNCTION 04/04/2008  . Hyperlipidemia 06/26/2007  . Diabetes ([place]) 06/25/2007  . NEPHROLITHIASIS, HX OF 06/25/2007   Past Medical History:  Diagnosis Date  . Acquired right foot drop    post lumbar surgery,  wears brace  . B12 deficiency   . Chronic constipation    diet  . ED (erectile dysfunction)   . History of kidney stones   . Hyperlipidemia   . Hyperplasia of prostate with lower urinary tract symptoms (LUTS)   . Hypertension   . Insomnia   . Neuropathy, peripheral    right foot  . Presence of surgical incision 01/21/2019   s/p  left carpal tunnel release --  pt has dressing on,  denies pain or numbness  . Prostate cancer [facility]) urologist-- dr eskridge/  oncologist-  dr Tammi Klippel   dx 08-04-2018,  Stage T1c, Gleason 4+4  . Renal cyst, right   . Type 2 diabetes mellitus ([place])    followed by pcp  . Wears partial dentures    upper and lower    Family History  Problem Relation Age of Onset  . Healthy Mother   . Prostate cancer Brother   . Diabetes Father   . Colon cancer Brother 110  . Prostate cancer Cousin        maternal  . Prostate cancer Nephew     Past Surgical History:  Procedure Laterality Date  . CARPAL TUNNEL RELEASE Bilateral left 01-21-2019;  right 1980s  . CYSTOSCOPY N/A 01/26/2019   Procedure: CYSTOSCOPY FLEXIBLE;  Surgeon: Festus Aloe, MD;  [facility];  Service: Urology;  Laterality: N/A;  no seeds found in bladder  . LUMBAR DISC SURGERY  1984  L4-5;  1998 L4--S1  . LUMBAR EPIDURAL INJECTION  2009, 2006   dr newton  . LUMBAR MICRODISCECTOMY  05-13-2002  dr Jenean Lindau  @[facility]    L4-5  .  RADIOACTIVE SEED IMPLANT N/A 01/26/2019   Procedure: RADIOACTIVE SEED IMPLANT/BRACHYTHERAPY IMPLANT;  Surgeon: Festus Aloe, MD;  Location[facility];  Service: Urology;  Laterality: N/A;    54 seeds implanted  . SPACE OAR INSTILLATION N/A 01/26/2019   Procedure: SPACE OAR INSTILLATION;  Surgeon: Festus Aloe, MD;  Location: [facility];  Service: Urology;  Laterality: N/A;   Social History   Occupational History    Comment: retired  Tobacco Use  . Smoking status: Former Smoker    Years: 20.00    Types: Cigarettes  Quit date: 01/25/2003    Years since quitting: 16.7  . Smokeless tobacco: Never Used  Substance and Sexual Activity  . Alcohol use: Yes    Alcohol/week: 3.0 standard drinks    Types: 3 Glasses of wine per week  . Drug use: Never  . Sexual activity: Not on file

## 2019-10-25 NOTE — Telephone Encounter (Signed)
    1.Medication Requested: glucose blood (FREESTYLE TEST STRIPS) test strip  2. Pharmacy (Name, Street, [place]):[place] ([place] - [address]  3. On Med List: yes  4. Last Visit with PCP: 05/10/19  5. Next visit date with PCP:11/25/19   Agent: Please be advised that RX refills may take up to 3 business days. We ask that you follow-up with your pharmacy.

## 2019-10-25 NOTE — Telephone Encounter (Signed)
Reviewed chart pt is up-to-date sent refills to [facility].Marland KitchenJohny Chess

## 2019-10-26 ENCOUNTER — Telehealth: Payer: Self-pay

## 2019-10-26 NOTE — Telephone Encounter (Deleted)
error 

## 2019-11-12 ENCOUNTER — Other Ambulatory Visit: Payer: Self-pay | Admitting: Internal Medicine

## 2019-11-25 ENCOUNTER — Ambulatory Visit: Payer: Medicare Other | Admitting: Internal Medicine

## 2019-11-25 ENCOUNTER — Other Ambulatory Visit: Payer: Self-pay

## 2019-11-25 ENCOUNTER — Encounter: Payer: Self-pay | Admitting: Internal Medicine

## 2019-11-25 VITALS — BP 140/80 | HR 76 | Temp 98.2°F | Ht 67.0 in | Wt 158.4 lb

## 2019-11-25 DIAGNOSIS — Z0000 Encounter for general adult medical examination without abnormal findings: Secondary | ICD-10-CM

## 2019-11-25 DIAGNOSIS — E559 Vitamin D deficiency, unspecified: Secondary | ICD-10-CM

## 2019-11-25 DIAGNOSIS — E119 Type 2 diabetes mellitus without complications: Secondary | ICD-10-CM

## 2019-11-25 DIAGNOSIS — E611 Iron deficiency: Secondary | ICD-10-CM

## 2019-11-25 DIAGNOSIS — E538 Deficiency of other specified B group vitamins: Secondary | ICD-10-CM

## 2019-11-25 LAB — LIPID PANEL
Cholesterol: 145 mg/dL (ref 0–200)
HDL: 57 mg/dL (ref >39.00)
LDL Cholesterol: 70 mg/dL (ref 0–99)
NonHDL: 87.96
Total CHOL/HDL Ratio: 3
Triglycerides: 91 mg/dL (ref 0.0–149.0)
VLDL: 18.2 mg/dL (ref 0.0–40.0)

## 2019-11-25 LAB — HEPATIC FUNCTION PANEL
ALT: 11 U/L (ref 0–53)
AST: 11 U/L (ref 0–37)
Albumin: 4.2 g/dL (ref 3.5–5.2)
Alkaline Phosphatase: 84 U/L (ref 39–117)
Bilirubin, Direct: 0.1 mg/dL (ref 0.0–0.3)
Total Bilirubin: 0.4 mg/dL (ref 0.2–1.2)
Total Protein: 6.1 g/dL (ref 6.0–8.3)

## 2019-11-25 LAB — TSH: TSH: 0.97 u[IU]/mL (ref 0.35–4.50)

## 2019-11-25 LAB — CBC WITH DIFFERENTIAL/PLATELET
Basophils Absolute: 0 10*3/uL (ref 0.0–0.1)
Basophils Relative: 1.2 % (ref 0.0–3.0)
Eosinophils Absolute: 0.1 10*3/uL (ref 0.0–0.7)
Eosinophils Relative: 4 % (ref 0.0–5.0)
HCT: 33.5 % — ABNORMAL LOW (ref 39.0–52.0)
Hemoglobin: 10.9 g/dL — ABNORMAL LOW (ref 13.0–17.0)
Lymphocytes Relative: 26 % (ref 12.0–46.0)
Lymphs Abs: 0.8 10*3/uL (ref 0.7–4.0)
MCHC: 32.4 g/dL (ref 30.0–36.0)
MCV: 81.3 fl (ref 78.0–100.0)
Monocytes Absolute: 0.4 10*3/uL (ref 0.1–1.0)
Monocytes Relative: 14.7 % — ABNORMAL HIGH (ref 3.0–12.0)
Neutro Abs: 1.6 10*3/uL (ref 1.4–7.7)
Neutrophils Relative %: 54.1 % (ref 43.0–77.0)
Platelets: 111 10*3/uL — ABNORMAL LOW (ref 150.0–400.0)
RBC: 4.12 Mil/uL — ABNORMAL LOW (ref 4.22–5.81)
RDW: 14.9 % (ref 11.5–15.5)
WBC: 3 10*3/uL — ABNORMAL LOW (ref 4.0–10.5)

## 2019-11-25 LAB — URINALYSIS, ROUTINE W REFLEX MICROSCOPIC
Bilirubin Urine: NEGATIVE
Hgb urine dipstick: NEGATIVE
Ketones, ur: NEGATIVE
Leukocytes,Ua: NEGATIVE
Nitrite: NEGATIVE
RBC / HPF: NONE SEEN (ref 0–?)
Specific Gravity, Urine: >=1.03 — AB (ref 1.000–1.030)
Total Protein, Urine: NEGATIVE
Urine Glucose: 250 — AB
Urobilinogen, UA: 0.2 (ref 0.0–1.0)
WBC, UA: NONE SEEN (ref 0–?)
pH: 5 (ref 5.0–8.0)

## 2019-11-25 LAB — HEMOGLOBIN A1C: Hgb A1c MFr Bld: 7.2 % — ABNORMAL HIGH (ref 4.6–6.5)

## 2019-11-25 LAB — BASIC METABOLIC PANEL
BUN: 12 mg/dL (ref 6–23)
CO2: 29 mEq/L (ref 19–32)
Calcium: 9.7 mg/dL (ref 8.4–10.5)
Chloride: 105 mEq/L (ref 96–112)
Creatinine, Ser: 0.81 mg/dL (ref 0.40–1.50)
GFR: 111.56 mL/min (ref >60.00)
Glucose, Bld: 199 mg/dL — ABNORMAL HIGH (ref 70–99)
Potassium: 3.9 mEq/L (ref 3.5–5.1)
Sodium: 137 mEq/L (ref 135–145)

## 2019-11-25 LAB — PSA: PSA: 0 ng/mL — ABNORMAL LOW (ref 0.10–4.00)

## 2019-11-25 LAB — VITAMIN B12: Vitamin B-12: 763 pg/mL (ref 211–911)

## 2019-11-25 LAB — VITAMIN D 25 HYDROXY (VIT D DEFICIENCY, FRACTURES): VITD: 13.05 ng/mL — ABNORMAL LOW (ref 30.00–100.00)

## 2019-11-25 NOTE — Progress Notes (Signed)
Subjective:    Patient ID: Alex Massey, male    DOB: Sep 11, 1940, 79 y.o.   MRN: [id number]  HPI  Here for wellness and f/u;  Overall doing ok;  Pt denies Chest pain, worsening SOB, DOE, wheezing, orthopnea, PND, worsening LE edema, palpitations, dizziness or syncope.  Pt denies neurological change such as new headache, facial or extremity weakness.  Pt denies polydipsia, polyuria, or low sugar symptoms. Pt states overall good compliance with treatment and medications, good tolerability, and has been trying to follow appropriate diet.  Pt denies worsening depressive symptoms, suicidal ideation or panic. No fever, night sweats, wt loss, loss of appetite, or other constitutional symptoms.  Pt states good ability with ADL's, has low fall risk, home safety reviewed and adequate, no other significant changes in hearing or vision, and only occasionally active with exercise.  No new complaints Past Medical History:  Diagnosis Date  . Acquired right foot drop    post lumbar surgery,  wears brace  . B12 deficiency   . Chronic constipation    diet  . ED (erectile dysfunction)   . History of kidney stones   . Hyperlipidemia   . Hyperplasia of prostate with lower urinary tract symptoms (LUTS)   . Hypertension   . Insomnia   . Neuropathy, peripheral    right foot  . Presence of surgical incision 01/21/2019   s/p  left carpal tunnel release --  pt has dressing on,  denies pain or numbness  . Prostate cancer [facility]) urologist-- dr eskridge/  oncologist-  dr Tammi Klippel   dx 08-04-2018,  Stage T1c, Gleason 4+4  . Renal cyst, right   . Type 2 diabetes mellitus ([place])    followed by pcp  . Wears partial dentures    upper and lower   Past Surgical History:  Procedure Laterality Date  . CARPAL TUNNEL RELEASE Bilateral left 01-21-2019;  right 1980s  . CYSTOSCOPY N/A 01/26/2019   Procedure: CYSTOSCOPY FLEXIBLE;  Surgeon: Festus Aloe, MD;  Location: [facility];  Service: Urology;   Laterality: N/A;  no seeds found in bladder  . LUMBAR DISC SURGERY  1984  L4-5;  1998 L4--S1  . LUMBAR EPIDURAL INJECTION  2009, 2006   dr newton  . LUMBAR MICRODISCECTOMY  05-13-2002  dr Jenean Lindau  _0    L4-5  . RADIOACTIVE SEED IMPLANT N/A 01/26/2019   Procedure: RADIOACTIVE SEED IMPLANT/BRACHYTHERAPY IMPLANT;  Surgeon: Festus Aloe, MD;  Location: [facility];  Service: Urology;  Laterality: N/A;    54 seeds implanted  . SPACE OAR INSTILLATION N/A 01/26/2019   Procedure: SPACE OAR INSTILLATION;  Surgeon: Festus Aloe, MD;  Location: [facility];  Service: Urology;  Laterality: N/A;    reports that he quit smoking about 16 years ago. His smoking use included cigarettes. He quit after 20.00 years of use. He has never used smokeless tobacco. He reports current alcohol use of about 3.0 standard drinks of alcohol per week. He reports that he does not use drugs. family history includes Colon cancer (age of onset: 39) in his brother; Diabetes in his father; Healthy in his mother; Prostate cancer in his brother, cousin, and nephew. Allergies  Allergen Reactions  . Metformin And Related Other (See Comments)    GI upset, diarrhea, wt loss   Current Outpatient Medications on File Prior to Visit  Medication Sig Dispense Refill  . acarbose (PRECOSE) 50 MG tablet TAKE 1 TABLET BY MOUTH THREE TIMES DAILY WITH MEALS 270  tablet 1  . aspirin EC 81 MG tablet Take 1 tablet (81 mg total) by mouth daily. 150 tablet 2  . blood glucose meter kit and supplies KIT Inject 1 each into the skin 2 (two) times daily. Dispense based on patient and insurance preference. Use up to four times daily as directed. (FOR ICD-9 250.00, 250.01). 1 each 0  . Blood Glucose Monitoring Suppl (FREESTYLE LITE) DEVI USE TO CHECK GLUCOSE UP TO 4 TIMES DAILY AS DIRECTED    . gabapentin (NEURONTIN) 300 MG capsule Take 1 capsule (300 mg total) by mouth at bedtime. 90 capsule 4  . glipiZIDE (GLUCOTROL XL) 10  MG 24 hr tablet TAKE 1 TABLET BY MOUTH DAILY WITH BREAKFAST 90 tablet 1  . glucose blood (FREESTYLE TEST STRIPS) test strip Use to check blood sugars twice a day 180 each 1  . ID NOW COVID-19 KIT See admin instructions.    . Lancets (FREESTYLE) lancets Use to check blood sugars two times daily. Dx Code: E11.9 200 each 3  . losartan (COZAAR) 50 MG tablet TAKE 1 TABLET(50 MG) BY MOUTH DAILY 90 tablet 2  . meloxicam (MOBIC) 15 MG tablet Take 1 tablet (15 mg total) by mouth daily. 30 tablet 3  . simvastatin (ZOCOR) 20 MG tablet TAKE 1 TABLET(20 MG) BY MOUTH AT BEDTIME 90 tablet 1  . sitaGLIPtin (JANUVIA) 100 MG tablet Take 1 tablet (100 mg total) by mouth daily. 90 tablet 3  . tamsulosin (FLOMAX) 0.4 MG CAPS capsule Take 0.4 mg by mouth daily.    . vitamin B-12 (CYANOCOBALAMIN) 1000 MCG tablet Take 1,000 mcg by mouth daily.     No current facility-administered medications on file prior to visit.   Review of Systems All otherwise neg per pt    Objective:   Physical Exam BP 140/80 (BP Location: Left Arm, Patient Position: Sitting, Cuff Size: Large)   Pulse 76   Temp 98.2 F (36.8 C) (Oral)   Ht _0  (1.702 m)   Wt 158 lb 6 oz (71.8 kg)   SpO2 97%   BMI 24.81 kg/m  VS noted,  Constitutional: Pt appears in NAD HENT: Head: NCAT.  Right Ear: External ear normal.  Left Ear: External ear normal.  Eyes: . Pupils are equal, round, and reactive to light. Conjunctivae and EOM are normal Nose: without d/c or deformity Neck: Neck supple. Gross normal ROM Cardiovascular: Normal rate and regular rhythm.   Pulmonary/Chest: Effort normal and breath sounds without rales or wheezing.  Abd:  Soft, NT, ND, + BS, no organomegaly Neurological: Pt is alert. At baseline orientation, motor grossly intact Skin: Skin is warm. No rashes, other new lesions, no LE edema Psychiatric: Pt behavior is normal without agitation  All otherwise neg per pt Lab Results  Component Value Date   WBC 3.0 (L) 11/25/2019     HGB 10.9 (L) 11/25/2019   HCT 33.5 (L) 11/25/2019   PLT 111.0 (L) 11/25/2019   GLUCOSE 199 (H) 11/25/2019   CHOL 145 11/25/2019   TRIG 91.0 11/25/2019   HDL 57.00 11/25/2019   LDLCALC 70 11/25/2019   ALT 11 11/25/2019   AST 11 11/25/2019   NA 137 11/25/2019   K 3.9 11/25/2019   CL 105 11/25/2019   CREATININE 0.81 11/25/2019   BUN 12 11/25/2019   CO2 29 11/25/2019   TSH 0.97 11/25/2019   PSA 0.00 (L) 11/25/2019   INR 1.0 01/22/2019   HGBA1C 7.2 (H) 11/25/2019   MICROALBUR <0.7 11/24/2018  Assessment & Plan:

## 2019-11-25 NOTE — Patient Instructions (Signed)

## 2019-11-27 ENCOUNTER — Encounter: Payer: Self-pay | Admitting: Internal Medicine

## 2019-11-27 NOTE — Assessment & Plan Note (Signed)

## 2019-11-27 NOTE — Assessment & Plan Note (Signed)
stable overall by history and exam, recent data reviewed with pt, and pt to continue medical treatment as before,  to f/u any worsening symptoms or concerns  

## 2019-12-01 ENCOUNTER — Other Ambulatory Visit (INDEPENDENT_AMBULATORY_CARE_PROVIDER_SITE_OTHER): Payer: Medicare Other

## 2019-12-01 DIAGNOSIS — E119 Type 2 diabetes mellitus without complications: Secondary | ICD-10-CM | POA: Diagnosis not present

## 2019-12-01 LAB — HEPATIC FUNCTION PANEL
ALT: 11 U/L (ref 0–53)
AST: 13 U/L (ref 0–37)
Albumin: 4.5 g/dL (ref 3.5–5.2)
Alkaline Phosphatase: 89 U/L (ref 39–117)
Bilirubin, Direct: 0.1 mg/dL (ref 0.0–0.3)
Total Bilirubin: 0.5 mg/dL (ref 0.2–1.2)
Total Protein: 6.8 g/dL (ref 6.0–8.3)

## 2019-12-01 LAB — BASIC METABOLIC PANEL
BUN: 16 mg/dL (ref 6–23)
CO2: 27 mEq/L (ref 19–32)
Calcium: 9.9 mg/dL (ref 8.4–10.5)
Chloride: 105 mEq/L (ref 96–112)
Creatinine, Ser: 0.85 mg/dL (ref 0.40–1.50)
GFR: 105.52 mL/min (ref >60.00)
Glucose, Bld: 185 mg/dL — ABNORMAL HIGH (ref 70–99)
Potassium: 4 mEq/L (ref 3.5–5.1)
Sodium: 139 mEq/L (ref 135–145)

## 2019-12-01 LAB — LIPID PANEL
Cholesterol: 149 mg/dL (ref 0–200)
HDL: 59.8 mg/dL (ref >39.00)
LDL Cholesterol: 73 mg/dL (ref 0–99)
NonHDL: 88.82
Total CHOL/HDL Ratio: 2
Triglycerides: 79 mg/dL (ref 0.0–149.0)
VLDL: 15.8 mg/dL (ref 0.0–40.0)

## 2019-12-01 LAB — HEMOGLOBIN A1C: Hgb A1c MFr Bld: 7.3 % — ABNORMAL HIGH (ref 4.6–6.5)

## 2019-12-02 ENCOUNTER — Encounter: Payer: Self-pay | Admitting: Internal Medicine

## 2019-12-02 ENCOUNTER — Other Ambulatory Visit (INDEPENDENT_AMBULATORY_CARE_PROVIDER_SITE_OTHER): Payer: Medicare Other

## 2019-12-02 DIAGNOSIS — Z0000 Encounter for general adult medical examination without abnormal findings: Secondary | ICD-10-CM | POA: Diagnosis not present

## 2019-12-02 LAB — IBC + FERRITIN
Ferritin: 306.1 ng/mL (ref 22.0–322.0)
Iron: 69 ug/dL (ref 42–165)
Saturation Ratios: 19.6 % — ABNORMAL LOW (ref 20.0–50.0)
Transferrin: 251 mg/dL (ref 212.0–360.0)

## 2019-12-03 NOTE — Progress Notes (Signed)
Patient ID: Alex Massey, male   DOB: Apr 08, 1941, 79 y.o.   MRN: [id number] Medical screening examination/treatment/procedure(s) were performed by non-physician practitioner and as supervising physician I was immediately available for consultation/collaboration. I agree with above. Cathlean Cower, MD

## 2019-12-03 NOTE — Addendum Note (Signed)
Addended by: Marijean Heath R on: 12/03/2019 10:38 AM   Modules accepted: Orders

## 2019-12-08 ENCOUNTER — Telehealth: Payer: Self-pay | Admitting: Internal Medicine

## 2019-12-08 NOTE — Telephone Encounter (Signed)
No sorry I am out of office for this week

## 2019-12-08 NOTE — Telephone Encounter (Signed)
Patient states he dropped off a patient assistance form to be signed to help him get the following medication free.  sitaGLIPtin (JANUVIA) 100 MG tablet  Dropped off on May 22 @ 10 am.  He would like a follow up call back on this.

## 2019-12-08 NOTE — Telephone Encounter (Signed)
Ok to let pt know as I think I already have at his OV, that he can ask his pharmacist for a similar med that is covered by his insurance so that we do not bear the cost of the PA process and pt to does not have to wait for weeks to get a similar med started

## 2019-12-08 NOTE — Telephone Encounter (Signed)
[name]/ Dr. Jenny Reichmann have you seen pt patient assistance forms- see msg below.Marland KitchenJohny Massey

## 2019-12-09 NOTE — Telephone Encounter (Signed)
Tried calling pt several times to come pick up papers. No answer and unable to leave vm. Papers are ready for pick up.

## 2020-01-04 ENCOUNTER — Telehealth: Payer: Self-pay | Admitting: Physical Medicine and Rehabilitation

## 2020-01-04 NOTE — Telephone Encounter (Signed)
Pt had Rt S1 TF 09/29/19, pt is wanting a repeat of injection. Ok to repeat if helped and no new traumas/injuries.

## 2020-01-04 NOTE — Telephone Encounter (Signed)
Pt had a change in symptoms pt is scheduled for an OV.

## 2020-01-04 NOTE — Telephone Encounter (Signed)
Pt would like to set up an appt.   [phone]

## 2020-01-04 NOTE — Telephone Encounter (Signed)
Ok if helped 

## 2020-01-05 ENCOUNTER — Encounter: Payer: Self-pay | Admitting: Physical Medicine and Rehabilitation

## 2020-01-05 ENCOUNTER — Ambulatory Visit (INDEPENDENT_AMBULATORY_CARE_PROVIDER_SITE_OTHER): Payer: Medicare Other | Admitting: Physical Medicine and Rehabilitation

## 2020-01-05 ENCOUNTER — Other Ambulatory Visit: Payer: Self-pay

## 2020-01-05 ENCOUNTER — Telehealth: Payer: Self-pay | Admitting: *Deleted

## 2020-01-05 ENCOUNTER — Telehealth: Payer: Self-pay

## 2020-01-05 VITALS — BP 157/78 | HR 63

## 2020-01-05 DIAGNOSIS — M961 Postlaminectomy syndrome, not elsewhere classified: Secondary | ICD-10-CM

## 2020-01-05 DIAGNOSIS — R202 Paresthesia of skin: Secondary | ICD-10-CM | POA: Diagnosis not present

## 2020-01-05 DIAGNOSIS — M21371 Foot drop, right foot: Secondary | ICD-10-CM

## 2020-01-05 DIAGNOSIS — M5416 Radiculopathy, lumbar region: Secondary | ICD-10-CM | POA: Diagnosis not present

## 2020-01-05 MED ORDER — GABAPENTIN 600 MG PO TABS: 300.0000 mg | ORAL_TABLET | Freq: Two times a day (BID) | ORAL | 3 refills | Status: DC

## 2020-01-05 NOTE — Telephone Encounter (Signed)
Returned pt phone call, pt is r/s for 230 on 01/20/20 with driver.

## 2020-01-05 NOTE — Telephone Encounter (Signed)
Patient called in to re schedule due to not having a driver for his upcoming appt.

## 2020-01-05 NOTE — Progress Notes (Signed)
Pain and numbness in right foot. Difficulty walking due to pain. Wants to discuss new brace.  Numeric Pain Rating Scale and Functional Assessment Average Pain 7   In the last MONTH (on 0-10 scale) has pain interfered with the following?  1. General activity like being  able to carry out your everyday physical activities such as walking, climbing stairs, carrying groceries, or moving a chair?  Rating(0)

## 2020-01-05 NOTE — Telephone Encounter (Signed)
Spoke with Misty K and no pa is needed for [id number] Reference # [id number]

## 2020-01-06 ENCOUNTER — Encounter: Payer: Medicare Other | Admitting: Physical Medicine and Rehabilitation

## 2020-01-06 NOTE — Progress Notes (Signed)
Alex Massey - 79 y.o. male MRN [id number]  Date of birth: 1941/03/26  Office Visit Note: Visit Date: 01/05/2020 PCP: Biagio Borg, MD Referred by: Biagio Borg, MD  Subjective: Chief Complaint  Patient presents with  . Lower Back - Pain  . Right Foot - Numbness, Pain   HPI: Alex Massey is a 79 y.o. male who comes in today For evaluation management of low back pain and right radicular pain with paresthesia and right foot drop which is chronic.  He was originally sent to [name] many years ago by Dr. Meridee Score.  He has had 2 prior lumbar laminectomies and status post 1 of those surgeries has had chronic right foot drop.  He had had prior AFO that was very ill which was a double metal upright and that had been switched by [name] at some point with referral to [facility] for a more updated AFO.  He has benefited from time to time with epidural injection.  We did complete S1 transforaminal injection back in March that he said did help greatly and it just slowly returned.  It does seem to help with most of the hip and leg pain but he still gets paresthesia and foot drop and that has not changed.  We had a long discussion today about foot drop and why he has this.  He talks to me today about how he has trouble with the new AFO and that he likes features of the old one and would like to see if there was some way to modify the one he has or may be perhaps have a custom one made.  He probably qualifies for a custom AFOs he does have diabetes with a history of some neuropathy and now with foot drop from lumbar surgery and lumbar stenosis.  Last MRI is reviewed below does show continued narrowing on that side despite surgery.  He has had no new issues no bowel or bladder difficulties etc.  He actually tells me he does have a appointment with [facility] coming up in the near future.  He also currently sees Dr. Ninfa Linden for orthopedic complaints as well.  Review of Systems  Musculoskeletal: Positive for back  pain and joint pain.  Neurological: Positive for tingling and focal weakness.  All other systems reviewed and are negative.  Otherwise per HPI.  Assessment & Plan: Visit Diagnoses:  1. Lumbar radiculopathy   2. Post laminectomy syndrome   3. Right foot drop   4. Paresthesia of skin     Plan: Findings:  1.  Chronic radicular pain with foot drop in the right leg with AFO this probably 48 or 79 years old at this point.  We discussed with him that this is obviously foot drop and weakness that will not return.  It likely represents damage to the L5 nerve root.  He manages pretty well and stays active.  The AFO will help him when he walks.  He is really not happy with the other aspects of the AFO compared to his old one.  I did write a prescription today just in case it is needed to take with him to his appointment and see if they could look at perhaps even making a custom unit if they needed to.  I do think he would benefit from this from a gait stability standpoint and safety standpoint.  He does have diabetes.  2.  Low back pain and right hip and leg pain still consistent more with an  L5 or S1 radicular type pain.  S1 transforaminal injection did seem to help for several months.  He got more than 50% relief.  He does wish to have another injection if possible.  I will schedule him for repeat S1 transforaminal injection since that seemed to help pretty well.  Another avenue of therapy for him in the future could be spinal cord stimulator trial and I briefly mentioned that.  He will stay on current medications.  He is on gabapentin.  I did increase his gabapentin today to slowly go up to 600 mg twice daily.  He tolerates this without issue.    Meds & Orders:  Meds ordered this encounter  Medications  . gabapentin (NEURONTIN) 600 MG tablet    Sig: Take 0.5-1 tablets (300-600 mg total) by mouth 2 (two) times daily.    Dispense:  120 tablet    Refill:  3   No orders of the defined types were placed in  this encounter.   Follow-up: Return for Right S1 transforaminal injection.   Procedures: No procedures performed  No notes on file   Clinical History: MRI LUMBAR SPINE WITHOUT CONTRAST  TECHNIQUE: Multiplanar, multisequence MR imaging of the lumbar spine was performed. No intravenous contrast was administered.  COMPARISON:  08/02/2007  FINDINGS: Straightening of the normal lumbar lordosis is unchanged. There is no significant listhesis. Vertebral body heights are preserved. There is disc desiccation and severe disc space narrowing at L5-S1 with mild narrowing at L4-5. Mild degenerative marrow changes are present at L5-S1. Subcentimeter T1 hypointense focus in the posterior L3 vertebral body is unchanged and likely benign. Conus medullaris is normal in signal and terminates at L1. Paraspinal soft tissues are unremarkable.  L1-2:  Negative.  L2-3:  Mild disc bulging without stenosis, unchanged.  L3-4: Mild disc bulging and mild facet and ligamentum flavum hypertrophy result in mild right neural foraminal stenosis, minimally increased from prior.  L4-5: Prior right hemilaminectomy. Circumferential disc bulging slightly asymmetric to the right is again seen with suggestion of an increased although small right subarticular disc extrusion which may affect the right L5 nerve root in the lateral recess. A componenet of postoperative change in this region is also possible. There is no spinal stenosis. Circumferential disc bulging results in mild to moderate right greater than left neural foraminal stenosis, unchanged.  L5-S1: Prior right hemilaminectomy. Mild circumferential disc bulging results in moderate left neural foraminal stenosis, unchanged. No spinal stenosis.  IMPRESSION: 1. Possible small right subarticular disc extrusion at L4-5 potentially affecting the right L5 nerve root. Some of this appearance could be postoperative in nature. Postcontrast  imaging might be helpful for further differentiation. 2. Unchanged mild to moderate neural foraminal stenosis at L4-5 and moderate left neural foraminal stenosis at L5-S1.   Electronically Signed   By: Logan Bores   On: 07/17/2014 17:43   He reports that he quit smoking about 16 years ago. His smoking use included cigarettes. He quit after 20.00 years of use. He has never used smokeless tobacco.  Recent Labs    05/28/19 0953 11/25/19 1032 12/01/19 0746  HGBA1C 6.2* 7.2* 7.3*    Objective:  VS:  HT:    WT:   BMI:     BP:(!) 157/78  HR:63bpm  TEMP: ( )  RESP:  Physical Exam Vitals and nursing note reviewed.  Constitutional:      General: He is not in acute distress.    Appearance: Normal appearance. He is well-developed.  HENT:  Head: Normocephalic and atraumatic.  Eyes:     Conjunctiva/sclera: Conjunctivae normal.     Pupils: Pupils are equal, round, and reactive to light.  Cardiovascular:     Rate and Rhythm: Normal rate.     Pulses: Normal pulses.     Heart sounds: Normal heart sounds.  Pulmonary:     Effort: Pulmonary effort is normal. No respiratory distress.  Musculoskeletal:     Cervical back: Normal range of motion and neck supple. No rigidity.     Right lower leg: No edema.     Left lower leg: No edema.     Comments: Patient ambulates with a slightly forward flexed lumbar spine.  He does have some circumduction of the right foot with walking.  He is not wearing AFO today.  He does have weakness with dorsiflexion but good plantar flexion on the right.  He has decreased sensation in L5 dermatome.  He has no pain with hip rotation.  Skin:    General: Skin is warm and dry.     Findings: No erythema or rash.  Neurological:     General: No focal deficit present.     Mental Status: He is alert and oriented to person, place, and time.     Sensory: Sensory deficit present.     Motor: Weakness present.     Coordination: Coordination normal.     Gait: Gait  abnormal.  Psychiatric:        Mood and Affect: Mood normal.        Behavior: Behavior normal.     Ortho Exam  Imaging: No results found.  Past Medical/Family/Surgical/Social History: Medications & Allergies reviewed per EMR, new medications updated. Patient Active Problem List   Diagnosis Date Noted  . Status post carpal tunnel release 02/04/2019  . Left carpal tunnel syndrome 11/29/2018  . Malignant neoplasm of prostate ([place]) 09/03/2018  . Vitamin B12 deficiency 02/13/2018  . Lumbar radiculopathy 07/09/2016  . Radiculopathy due to lumbar intervertebral disc disorder 07/09/2016  . Hemorrhoid 08/07/2015  . BPH without obstruction/lower urinary tract symptoms 02/01/2015  . Essential hypertension 09/12/2014  . Routine general medical examination at a health care facility 01/14/2011  . ERECTILE DYSFUNCTION 04/04/2008  . Hyperlipidemia 06/26/2007  . Diabetes ([place]) 06/25/2007  . NEPHROLITHIASIS, HX OF 06/25/2007   Past Medical History:  Diagnosis Date  . Acquired right foot drop    post lumbar surgery,  wears brace  . B12 deficiency   . Chronic constipation    diet  . ED (erectile dysfunction)   . History of kidney stones   . Hyperlipidemia   . Hyperplasia of prostate with lower urinary tract symptoms (LUTS)   . Hypertension   . Insomnia   . Neuropathy, peripheral    right foot  . Presence of surgical incision 01/21/2019   s/p  left carpal tunnel release --  pt has dressing on,  denies pain or numbness  . Prostate cancer [facility]) urologist-- dr eskridge/  oncologist-  dr Tammi Klippel   dx 08-04-2018,  Stage T1c, Gleason 4+4  . Renal cyst, right   . Type 2 diabetes mellitus ([place])    followed by pcp  . Wears partial dentures    upper and lower   Family History  Problem Relation Age of Onset  . Healthy Mother   . Prostate cancer Brother   . Diabetes Father   . Colon cancer Brother 36  . Prostate cancer Cousin        maternal  .  Prostate cancer Nephew    Past Surgical  History:  Procedure Laterality Date  . CARPAL TUNNEL RELEASE Bilateral left 01-21-2019;  right 1980s  . CYSTOSCOPY N/A 01/26/2019   Procedure: CYSTOSCOPY FLEXIBLE;  Surgeon: Festus Aloe, MD;  Location: [facility];  Service: Urology;  Laterality: N/A;  no seeds found in bladder  . LUMBAR DISC SURGERY  1984  L4-5;  1998 L4--S1  . LUMBAR EPIDURAL INJECTION  2009, 2006   dr Marda Breidenbach  . LUMBAR MICRODISCECTOMY  05-13-2002  dr Jenean Lindau  @[facility]    L4-5  . RADIOACTIVE SEED IMPLANT N/A 01/26/2019   Procedure: RADIOACTIVE SEED IMPLANT/BRACHYTHERAPY IMPLANT;  Surgeon: Festus Aloe, MD;  Location[facility];  Service: Urology;  Laterality: N/A;    54 seeds implanted  . SPACE OAR INSTILLATION N/A 01/26/2019   Procedure: SPACE OAR INSTILLATION;  Surgeon: Festus Aloe, MD;  Location: [facility];  Service: Urology;  Laterality: N/A;   Social History   Occupational History    Comment: retired  Tobacco Use  . Smoking status: Former Smoker    Years: 20.00    Types: Cigarettes    Quit date: 01/25/2003    Years since quitting: 16.9  . Smokeless tobacco: Never Used  Vaping Use  . Vaping Use: Never used  Substance and Sexual Activity  . Alcohol use: Yes    Alcohol/week: 3.0 standard drinks    Types: 3 Glasses of wine per week  . Drug use: Never  . Sexual activity: Not on file

## 2020-01-20 ENCOUNTER — Ambulatory Visit: Payer: Self-pay

## 2020-01-20 ENCOUNTER — Encounter: Payer: Self-pay | Admitting: Physical Medicine and Rehabilitation

## 2020-01-20 ENCOUNTER — Ambulatory Visit (INDEPENDENT_AMBULATORY_CARE_PROVIDER_SITE_OTHER): Payer: Medicare Other | Admitting: Physical Medicine and Rehabilitation

## 2020-01-20 ENCOUNTER — Other Ambulatory Visit: Payer: Self-pay

## 2020-01-20 VITALS — BP 144/82 | HR 82

## 2020-01-20 DIAGNOSIS — M5416 Radiculopathy, lumbar region: Secondary | ICD-10-CM

## 2020-01-20 DIAGNOSIS — M961 Postlaminectomy syndrome, not elsewhere classified: Secondary | ICD-10-CM | POA: Diagnosis not present

## 2020-01-20 MED ORDER — METHYLPREDNISOLONE ACETATE 80 MG/ML IJ SUSP
80.0000 mg | Freq: Once | INTRAMUSCULAR | Status: AC
  Administered 2020-01-20: 80 mg

## 2020-01-20 NOTE — Progress Notes (Signed)
  Pt states numbeness in his right foot that travels up his right leg. Pt states pain medicine helps a little with the pain.  pt hx of joint inj on 10/1919. Hx of wearing a brace.   Numeric Pain Rating Scale and Functional Assessment Average Pain 6   In the last MONTH (on 0-10 scale) has pain interfered with the following?  1. General activity like being  able to carry out your everyday physical activities such as walking, climbing stairs, carrying groceries, or moving a chair?  Rating(6)   +Driver, -BT, -Dye Allergies.

## 2020-01-21 NOTE — Procedures (Signed)
S1 Lumbosacral Transforaminal Epidural Steroid Injection - Sub-Pedicular Approach with Fluoroscopic Guidance   Patient: Alex Massey      Date of Birth: 12-07-1940 MRN: [id number] PCP: Biagio Borg, MD      Visit Date: 01/20/2020   Universal Protocol:    Date/Time: 07/16/219:40 AM  Consent Given By: the patient  Position:  PRONE  Additional Comments: Vital signs were monitored before and after the procedure. Patient was prepped and draped in the usual sterile fashion. The correct patient, procedure, and site was verified.   Injection Procedure Details:  Procedure Site One Meds Administered:  Meds ordered this encounter  Medications  . methylPREDNISolone acetate (DEPO-MEDROL) injection 80 mg    Laterality: Right  Location/Site:  S1 Foramen   Needle size: 22 ga.  Needle type: Spinal  Needle Placement: Transforaminal  Findings:   -Comments: Excellent flow of contrast along the nerve and into the epidural space.   Procedure Details: After squaring off the sacral end-plate to get a true AP view, the C-arm was positioned so that the best possible view of the S1 foramen was visualized. The soft tissues overlying this structure were infiltrated with 2-3 ml. of 1% Lidocaine without Epinephrine.    The spinal needle was inserted toward the target using a "trajectory" view along the fluoroscope beam.  Under AP and lateral visualization, the needle was advanced so it did not puncture dura. Biplanar projections were used to confirm position. Aspiration was confirmed to be negative for CSF and/or blood. A 1-2 ml. volume of Isovue-250 was injected and flow of contrast was noted at each level. Radiographs were obtained for documentation purposes.   After attaining the desired flow of contrast documented above, a 0.5 to 1.0 ml test dose of 0.25% Marcaine was injected into each respective transforaminal space.  The patient was observed for 90 seconds post injection.  After no  sensory deficits were reported, and normal lower extremity motor function was noted,   the above injectate was administered so that equal amounts of the injectate were placed at each foramen (level) into the transforaminal epidural space.   Additional Comments:  The patient tolerated the procedure well Dressing: Band-Aid with 2 x 2 sterile gauze    Post-procedure details: Patient was observed during the procedure. Post-procedure instructions were reviewed.  Patient left the clinic in stable condition.

## 2020-01-21 NOTE — Progress Notes (Signed)
Alex Massey - 79 y.o. male MRN [id number]  Date of birth: 01-22-1941  Office Visit Note: Visit Date: 01/20/2020 PCP: Biagio Borg, MD Referred by: Biagio Borg, MD  Subjective: Chief Complaint  Patient presents with  . Right Foot - Numbness  . Right Leg - Numbness   HPI:  Alex Massey is a 79 y.o. male who comes in today at the request of Dr. Laurence Spates for planned Right S1-2 Lumbar epidural steroid injection with fluoroscopic guidance.  The patient has failed conservative care including home exercise, medications, time and activity modification.  This injection will be diagnostic and hopefully therapeutic.  Please see requesting physician notes for further details and justification.  He did see [employer] and has decided with their re-evaluation to go back to his older AFO and does feel he is walking better with that. Has permanent right foot drop from prior lumbar pathology and surgery.  ROS Otherwise per HPI.  Assessment & Plan: Visit Diagnoses:  1. Lumbar radiculopathy   2. Post laminectomy syndrome     Plan: No additional findings.   Meds & Orders:  Meds ordered this encounter  Medications  . methylPREDNISolone acetate (DEPO-MEDROL) injection 80 mg    Orders Placed This Encounter  Procedures  . XR C-ARM NO REPORT  . Epidural Steroid injection    Follow-up: Return if symptoms worsen or fail to improve.   Procedures: No procedures performed  S1 Lumbosacral Transforaminal Epidural Steroid Injection - Sub-Pedicular Approach with Fluoroscopic Guidance   Patient: Alex Massey      Date of Birth: 12-27-1940 MRN: [id number] PCP: Biagio Borg, MD      Visit Date: 01/20/2020   Universal Protocol:    Date/Time: 07/16/219:40 AM  Consent Given By: the patient  Position:  PRONE  Additional Comments: Vital signs were monitored before and after the procedure. Patient was prepped and draped in the usual sterile fashion. The correct patient, procedure,  and site was verified.   Injection Procedure Details:  Procedure Site One Meds Administered:  Meds ordered this encounter  Medications  . methylPREDNISolone acetate (DEPO-MEDROL) injection 80 mg    Laterality: Right  Location/Site:  S1 Foramen   Needle size: 22 ga.  Needle type: Spinal  Needle Placement: Transforaminal  Findings:   -Comments: Excellent flow of contrast along the nerve and into the epidural space.   Procedure Details: After squaring off the sacral end-plate to get a true AP view, the C-arm was positioned so that the best possible view of the S1 foramen was visualized. The soft tissues overlying this structure were infiltrated with 2-3 ml. of 1% Lidocaine without Epinephrine.    The spinal needle was inserted toward the target using a "trajectory" view along the fluoroscope beam.  Under AP and lateral visualization, the needle was advanced so it did not puncture dura. Biplanar projections were used to confirm position. Aspiration was confirmed to be negative for CSF and/or blood. A 1-2 ml. volume of Isovue-250 was injected and flow of contrast was noted at each level. Radiographs were obtained for documentation purposes.   After attaining the desired flow of contrast documented above, a 0.5 to 1.0 ml test dose of 0.25% Marcaine was injected into each respective transforaminal space.  The patient was observed for 90 seconds post injection.  After no sensory deficits were reported, and normal lower extremity motor function was noted,   the above injectate was administered so that equal amounts of the injectate were placed  at each foramen (level) into the transforaminal epidural space.   Additional Comments:  The patient tolerated the procedure well Dressing: Band-Aid with 2 x 2 sterile gauze    Post-procedure details: Patient was observed during the procedure. Post-procedure instructions were reviewed.  Patient left the clinic in stable condition.      Clinical History: MRI LUMBAR SPINE WITHOUT CONTRAST  TECHNIQUE: Multiplanar, multisequence MR imaging of the lumbar spine was performed. No intravenous contrast was administered.  COMPARISON:  08/02/2007  FINDINGS: Straightening of the normal lumbar lordosis is unchanged. There is no significant listhesis. Vertebral body heights are preserved. There is disc desiccation and severe disc space narrowing at L5-S1 with mild narrowing at L4-5. Mild degenerative marrow changes are present at L5-S1. Subcentimeter T1 hypointense focus in the posterior L3 vertebral body is unchanged and likely benign. Conus medullaris is normal in signal and terminates at L1. Paraspinal soft tissues are unremarkable.  L1-2:  Negative.  L2-3:  Mild disc bulging without stenosis, unchanged.  L3-4: Mild disc bulging and mild facet and ligamentum flavum hypertrophy result in mild right neural foraminal stenosis, minimally increased from prior.  L4-5: Prior right hemilaminectomy. Circumferential disc bulging slightly asymmetric to the right is again seen with suggestion of an increased although small right subarticular disc extrusion which may affect the right L5 nerve root in the lateral recess. A componenet of postoperative change in this region is also possible. There is no spinal stenosis. Circumferential disc bulging results in mild to moderate right greater than left neural foraminal stenosis, unchanged.  L5-S1: Prior right hemilaminectomy. Mild circumferential disc bulging results in moderate left neural foraminal stenosis, unchanged. No spinal stenosis.  IMPRESSION: 1. Possible small right subarticular disc extrusion at L4-5 potentially affecting the right L5 nerve root. Some of this appearance could be postoperative in nature. Postcontrast imaging might be helpful for further differentiation. 2. Unchanged mild to moderate neural foraminal stenosis at L4-5 and moderate left neural  foraminal stenosis at L5-S1.   Electronically Signed   By: Logan Bores   On: 07/17/2014 17:43     Objective:  VS:  HT:    WT:   BMI:     BP:(!) 144/82  HR:82bpm  TEMP: ( )  RESP:  Physical Exam Constitutional:      General: He is not in acute distress.    Appearance: Normal appearance. He is not ill-appearing.  HENT:     Head: Normocephalic and atraumatic.     Right Ear: External ear normal.     Left Ear: External ear normal.  Eyes:     Extraocular Movements: Extraocular movements intact.  Cardiovascular:     Rate and Rhythm: Normal rate.     Pulses: Normal pulses.  Abdominal:     General: There is no distension.     Palpations: Abdomen is soft.  Musculoskeletal:        General: No tenderness or signs of injury.     Right lower leg: No edema.     Left lower leg: No edema.     Comments: Has right foot drop with AFO. Good left sided strength.  Skin:    Findings: No erythema or rash.  Neurological:     General: No focal deficit present.     Mental Status: He is alert and oriented to person, place, and time.     Sensory: No sensory deficit.     Motor: No weakness or abnormal muscle tone.     Coordination: Coordination normal.  Psychiatric:  Mood and Affect: Mood normal.        Behavior: Behavior normal.      Imaging: XR C-ARM NO REPORT  Result Date: 01/20/2020 Please see Notes tab for imaging impression.

## 2020-01-27 ENCOUNTER — Other Ambulatory Visit: Payer: Self-pay | Admitting: Orthopaedic Surgery

## 2020-01-27 NOTE — Telephone Encounter (Signed)
Can you advise?CB out.

## 2020-01-31 ENCOUNTER — Telehealth: Payer: Self-pay | Admitting: Physical Medicine and Rehabilitation

## 2020-01-31 NOTE — Telephone Encounter (Signed)
Patient states that he still has numbness in his right foot. Right S1 TF 7/15. Please advise.

## 2020-01-31 NOTE — Telephone Encounter (Signed)
Pt would like to get scheduled for an appt.   [phone]

## 2020-01-31 NOTE — Telephone Encounter (Signed)
He has had drop foot and numbness since surgery, not sure injection will help that. OV

## 2020-02-01 NOTE — Telephone Encounter (Signed)
Scheduled for OV. 

## 2020-02-02 ENCOUNTER — Encounter: Payer: Self-pay | Admitting: Physical Medicine and Rehabilitation

## 2020-02-02 ENCOUNTER — Other Ambulatory Visit: Payer: Self-pay

## 2020-02-02 ENCOUNTER — Ambulatory Visit (INDEPENDENT_AMBULATORY_CARE_PROVIDER_SITE_OTHER): Payer: Medicare Other | Admitting: Physical Medicine and Rehabilitation

## 2020-02-02 ENCOUNTER — Other Ambulatory Visit: Payer: Self-pay | Admitting: Internal Medicine

## 2020-02-02 VITALS — BP 165/84 | HR 61

## 2020-02-02 DIAGNOSIS — R202 Paresthesia of skin: Secondary | ICD-10-CM | POA: Diagnosis not present

## 2020-02-02 DIAGNOSIS — M961 Postlaminectomy syndrome, not elsewhere classified: Secondary | ICD-10-CM | POA: Diagnosis not present

## 2020-02-02 DIAGNOSIS — M5136 Other intervertebral disc degeneration, lumbar region: Secondary | ICD-10-CM

## 2020-02-02 DIAGNOSIS — M21371 Foot drop, right foot: Secondary | ICD-10-CM | POA: Diagnosis not present

## 2020-02-02 DIAGNOSIS — M5416 Radiculopathy, lumbar region: Secondary | ICD-10-CM | POA: Diagnosis not present

## 2020-02-02 NOTE — Telephone Encounter (Signed)
Please refill as per office routine med refill policy (all routine meds refilled for 3 mo or monthly per pt preference up to one year from last visit, then month to month grace period for 3 mo, then further med refills will have to be denied)  

## 2020-02-02 NOTE — Progress Notes (Signed)
Numbness in right foot. Has decreased a little since injection. Has been alternating new and old brace.   Numeric Pain Rating Scale and Functional Assessment Average Pain 5   In the last MONTH (on 0-10 scale) has pain interfered with the following?  1. General activity like being  able to carry out your everyday physical activities such as walking, climbing stairs, carrying groceries, or moving a chair?  Rating(5)

## 2020-02-03 ENCOUNTER — Encounter: Payer: Self-pay | Admitting: Gastroenterology

## 2020-02-04 ENCOUNTER — Telehealth: Payer: Self-pay | Admitting: *Deleted

## 2020-02-04 NOTE — Telephone Encounter (Signed)
Allie with [facility] neurology is calling just need clarification on what exactly you  are looking for a appointment.   Please return call to [phone]

## 2020-02-07 ENCOUNTER — Encounter: Payer: Self-pay | Admitting: Physical Medicine and Rehabilitation

## 2020-02-07 NOTE — Progress Notes (Signed)
Alex Massey - 79 y.o. male MRN [id number]  Date of birth: 09-03-1940  Office Visit Note: Visit Date: 02/02/2020 PCP: Biagio Borg, MD Referred by: Biagio Borg, MD  Subjective: Chief Complaint  Patient presents with  . Right Foot - Numbness   HPI: Alex Massey is a 79 y.o. male who comes in today For continued evaluation and management of worsening numbness paresthesia weakness in the right leg and foot.  He is status post epidural injection a few weeks ago and says maybe it helped with the pain but has not really helped with the numbness and tingling.  By way of quick review in our notes can be reviewed on as he has had a history of remote lumbar discectomy with initial weakness of the right foot post surgery.  He reports for a good while the tingling and numbness was better but he continued to have foot drop with use of AFO.  We have seen him in the past on numerous occasion and updated the AFO and that did not seem to work out as well as he really liked his older AFO in terms of comfort level.  He is now really switching in and out of those when he needs to.  He has not really noted decreased strength or decreasing strength of the foot but he has had increased numbness and tingling in the lower extremity below the knee to the top of the foot towards the big toe.  This is an L5 distribution or partly may be fibular nerve distribution.  MRI of the lumbar spine reviewed below and this was done a few years ago.  In the past epidural injection has helped his hip and leg pain quite a bit.  Again his pain and symptoms right now really more numbness and tingling in the right lower extremity more than pain of the lumbar spine or hip.  He does have a history of diabetes without confirmed diagnosis of polyneuropathy.  He had prior electrodiagnostic study of the hands with median neuropathy not showing peripheral neuropathy of the upper extremities at that point and that was done a few years ago  and those notes are in the chart as well.  He is currently using gabapentin 300 mg in the morning and 600 mg at night.  Review of Systems  Musculoskeletal: Positive for back pain and joint pain.  Neurological: Positive for tingling and focal weakness.  All other systems reviewed and are negative.  Otherwise per HPI.  Assessment & Plan: Visit Diagnoses:  1. Paresthesia of skin   2. Lumbar radiculopathy   3. Right foot drop   4. Post laminectomy syndrome   5. Other intervertebral disc degeneration, lumbar region     Plan: Findings:  Increased paresthesia numbness and tingling in the right lower extremity with history of lumbar discectomy many years ago with history of foot drop from the surgery with improvement and improvement over the years with epidural injection in terms of his back and hip pain.  Recent injection helped his hip pain but not the lower foot and lower extremity paresthesia.  Paresthesia is his main complaint.  Written increase his gabapentin to 600 mg twice a day as well as ordered electrodiagnostic study of the lower limb.  I would prefer to have this done at the neurology office just from the standpoint of them doing more complete exam and not have any bias from me doing the exam.  I also want to update MRI of  the lumbar spine.  He has not had this in a few years and he has not had much improvement of this lower extremity numbness and tingling with epidural injection.  This MRI can be done without contrast as he has had a contrasted MRI in the past post surgery.    Meds & Orders: No orders of the defined types were placed in this encounter.   Orders Placed This Encounter  Procedures  . MR LUMBAR SPINE WO CONTRAST  . Ambulatory referral to Neurology    Follow-up: Return for MRI review after completion.   Procedures: No procedures performed  No notes on file   Clinical History: MRI LUMBAR SPINE WITHOUT CONTRAST  TECHNIQUE: Multiplanar, multisequence MR imaging of  the lumbar spine was performed. No intravenous contrast was administered.  COMPARISON:  08/02/2007  FINDINGS: Straightening of the normal lumbar lordosis is unchanged. There is no significant listhesis. Vertebral body heights are preserved. There is disc desiccation and severe disc space narrowing at L5-S1 with mild narrowing at L4-5. Mild degenerative marrow changes are present at L5-S1. Subcentimeter T1 hypointense focus in the posterior L3 vertebral body is unchanged and likely benign. Conus medullaris is normal in signal and terminates at L1. Paraspinal soft tissues are unremarkable.  L1-2:  Negative.  L2-3:  Mild disc bulging without stenosis, unchanged.  L3-4: Mild disc bulging and mild facet and ligamentum flavum hypertrophy result in mild right neural foraminal stenosis, minimally increased from prior.  L4-5: Prior right hemilaminectomy. Circumferential disc bulging slightly asymmetric to the right is again seen with suggestion of an increased although small right subarticular disc extrusion which may affect the right L5 nerve root in the lateral recess. A componenet of postoperative change in this region is also possible. There is no spinal stenosis. Circumferential disc bulging results in mild to moderate right greater than left neural foraminal stenosis, unchanged.  L5-S1: Prior right hemilaminectomy. Mild circumferential disc bulging results in moderate left neural foraminal stenosis, unchanged. No spinal stenosis.  IMPRESSION: 1. Possible small right subarticular disc extrusion at L4-5 potentially affecting the right L5 nerve root. Some of this appearance could be postoperative in nature. Postcontrast imaging might be helpful for further differentiation. 2. Unchanged mild to moderate neural foraminal stenosis at L4-5 and moderate left neural foraminal stenosis at L5-S1.   Electronically Signed   By: Logan Bores   On: 07/17/2014 17:43   He  reports that he quit smoking about 17 years ago. His smoking use included cigarettes. He quit after 20.00 years of use. He has never used smokeless tobacco.  Recent Labs    05/28/19 0953 11/25/19 1032 12/01/19 0746  HGBA1C 6.2* 7.2* 7.3*    Objective:  VS:  HT:    WT:   BMI:     BP:(!) 165/84  HR:61bpm  TEMP: ( )  RESP:  Physical Exam Vitals and nursing note reviewed.  Constitutional:      General: He is not in acute distress.    Appearance: He is well-developed.  HENT:     Head: Normocephalic and atraumatic.     Nose: Nose normal.     Mouth/Throat:     Mouth: Mucous membranes are moist.     Pharynx: Oropharynx is clear.  Eyes:     Conjunctiva/sclera: Conjunctivae normal.     Pupils: Pupils are equal, round, and reactive to light.  Neck:     Trachea: No tracheal deviation.  Cardiovascular:     Rate and Rhythm: Normal rate and regular rhythm.  Pulses: Normal pulses.  Pulmonary:     Effort: Pulmonary effort is normal.     Breath sounds: Normal breath sounds.  Abdominal:     General: There is no distension.     Palpations: Abdomen is soft.     Tenderness: There is no guarding or rebound.  Musculoskeletal:        General: Tenderness present. No deformity.     Cervical back: Normal range of motion and neck supple.     Right lower leg: No edema.     Left lower leg: No edema.     Comments: Patient ambulates without aid today.  Not wearing AFO.  Does have good strength with dorsiflexion and plantar flexion bilaterally some weakness on the right but actually does move fairly well with strength on that side.  Lumbar spine has pain with extension and facet loading.  Skin:    General: Skin is warm and dry.     Findings: No erythema or rash.  Neurological:     General: No focal deficit present.     Mental Status: He is alert and oriented to person, place, and time.     Sensory: Sensory deficit present.     Motor: Weakness present. No abnormal muscle tone.      Coordination: Coordination normal.     Gait: Gait abnormal.     Comments: Decreased sensation in the lower leg and somewhat of a L5 dermatome versus fibular nerve.  Negative Tinel's at the fibular head.  Psychiatric:        Mood and Affect: Mood normal.        Behavior: Behavior normal.        Thought Content: Thought content normal.     Ortho Exam  Imaging: No results found.  Past Medical/Family/Surgical/Social History: Medications & Allergies reviewed per EMR, new medications updated. Patient Active Problem List   Diagnosis Date Noted  . Right foot drop 02/02/2020  . Paresthesia of skin 02/02/2020  . Status post carpal tunnel release 02/04/2019  . Left carpal tunnel syndrome 11/29/2018  . Malignant neoplasm of prostate ([place]) 09/03/2018  . Vitamin B12 deficiency 02/13/2018  . Lumbar radiculopathy 07/09/2016  . Radiculopathy due to lumbar intervertebral disc disorder 07/09/2016  . Hemorrhoid 08/07/2015  . BPH without obstruction/lower urinary tract symptoms 02/01/2015  . Essential hypertension 09/12/2014  . Routine general medical examination at a health care facility 01/14/2011  . ERECTILE DYSFUNCTION 04/04/2008  . Hyperlipidemia 06/26/2007  . Diabetes ([place]) 06/25/2007  . NEPHROLITHIASIS, HX OF 06/25/2007   Past Medical History:  Diagnosis Date  . Acquired right foot drop    post lumbar surgery,  wears brace  . B12 deficiency   . Chronic constipation    diet  . ED (erectile dysfunction)   . History of kidney stones   . Hyperlipidemia   . Hyperplasia of prostate with lower urinary tract symptoms (LUTS)   . Hypertension   . Insomnia   . Neuropathy, peripheral    right foot  . Presence of surgical incision 01/21/2019   s/p  left carpal tunnel release --  pt has dressing on,  denies pain or numbness  . Prostate cancer [facility]) urologist-- dr eskridge/  oncologist-  dr Tammi Klippel   dx 08-04-2018,  Stage T1c, Gleason 4+4  . Renal cyst, right   . Type 2 diabetes mellitus  ([place])    followed by pcp  . Wears partial dentures    upper and lower   Family History  Problem Relation  Age of Onset  . Healthy Mother   . Prostate cancer Brother   . Diabetes Father   . Colon cancer Brother 73  . Prostate cancer Cousin        maternal  . Prostate cancer Nephew    Past Surgical History:  Procedure Laterality Date  . CARPAL TUNNEL RELEASE Bilateral left 01-21-2019;  right 1980s  . CYSTOSCOPY N/A 01/26/2019   Procedure: CYSTOSCOPY FLEXIBLE;  Surgeon: Festus Aloe, MD;  Location: [facility];  Service: Urology;  Laterality: N/A;  no seeds found in bladder  . LUMBAR DISC SURGERY  1984  L4-5;  1998 L4--S1  . LUMBAR EPIDURAL INJECTION  2009, 2006   dr Lavayah Vita  . LUMBAR MICRODISCECTOMY  05-13-2002  dr Jenean Lindau  @[facility]    L4-5  . RADIOACTIVE SEED IMPLANT N/A 01/26/2019   Procedure: RADIOACTIVE SEED IMPLANT/BRACHYTHERAPY IMPLANT;  Surgeon: Festus Aloe, MD;  Location: [facility];  Service: Urology;  Laterality: N/A;    54 seeds implanted  . SPACE OAR INSTILLATION N/A 01/26/2019   Procedure: SPACE OAR INSTILLATION;  Surgeon: Festus Aloe, MD;  Location: [facility];  Service: Urology;  Laterality: N/A;   Social History   Occupational History    Comment: retired  Tobacco Use  . Smoking status: Former Smoker    Years: 20.00    Types: Cigarettes    Quit date: 01/25/2003    Years since quitting: 17.0  . Smokeless tobacco: Never Used  Vaping Use  . Vaping Use: Never used  Substance and Sexual Activity  . Alcohol use: Yes    Alcohol/week: 3.0 standard drinks    Types: 3 Glasses of wine per week  . Drug use: Never  . Sexual activity: Not on file

## 2020-02-07 NOTE — Telephone Encounter (Signed)
Called to advise Allie that Dr. Ernestina Patches wants the patient to have an EMG.

## 2020-02-25 ENCOUNTER — Ambulatory Visit: Payer: Medicare Other | Admitting: Diagnostic Neuroimaging

## 2020-02-25 ENCOUNTER — Telehealth: Payer: Self-pay | Admitting: Physical Medicine and Rehabilitation

## 2020-02-25 ENCOUNTER — Encounter: Payer: Self-pay | Admitting: Diagnostic Neuroimaging

## 2020-02-25 VITALS — BP 162/90 | HR 70 | Ht 66.0 in | Wt 159.0 lb

## 2020-02-25 DIAGNOSIS — M5417 Radiculopathy, lumbosacral region: Secondary | ICD-10-CM

## 2020-02-25 DIAGNOSIS — M21371 Foot drop, right foot: Secondary | ICD-10-CM

## 2020-02-25 NOTE — Progress Notes (Signed)
[facility]  PATIENT: Alex Massey DOB: 1940-07-17  REFERRING CLINICIAN: Magnus Sinning, MD HISTORY FROM: patient  REASON FOR VISIT: new consult    HISTORICAL  CHIEF COMPLAINT:  Chief Complaint  Patient presents with  . New Patient (Initial Visit)    rm 7 here as a new pt for right foot numbness. Pt said he has been having back pain.    HISTORY OF PRESENT ILLNESS:   79 year old male here for evaluation of right foot numbness and weakness.  Patient has a long history of low back pain radiating to the right leg.  He had first lumbar discectomy at L4-5 in 1984.  He had a second surgery in 2012 also at L4-5.  Following the second surgery in 2012 patient noticed increasing right foot numbness and weakness.  This has been managed conservatively over many years.  Symptoms have worsened over time.  Patient followed up with Dr. Ernestina Patches, who ordered MRI lumbar spine and requested neurology consult for consideration of EMG nerve conduction study as well.   REVIEW OF SYSTEMS: Full 14 system review of systems performed and negative with exception of: As per HPI.  ALLERGIES: Allergies  Allergen Reactions  . Metformin And Related Other (See Comments)    GI upset, diarrhea, wt loss    HOME MEDICATIONS: Outpatient Medications Prior to Visit  Medication Sig Dispense Refill  . acarbose (PRECOSE) 50 MG tablet TAKE 1 TABLET BY MOUTH THREE TIMES DAILY WITH MEALS 270 tablet 1  . aspirin EC 81 MG tablet Take 1 tablet (81 mg total) by mouth daily. 150 tablet 2  . blood glucose meter kit and supplies KIT Inject 1 each into the skin 2 (two) times daily. Dispense based on patient and insurance preference. Use up to four times daily as directed. (FOR ICD-9 250.00, 250.01). 1 each 0  . Blood Glucose Monitoring Suppl (FREESTYLE LITE) DEVI USE TO CHECK GLUCOSE UP TO 4 TIMES DAILY AS DIRECTED    . gabapentin (NEURONTIN) 600 MG tablet Take 0.5-1 tablets (300-600 mg total) by mouth  2 (two) times daily. 120 tablet 3  . glipiZIDE (GLUCOTROL XL) 10 MG 24 hr tablet TAKE 1 TABLET BY MOUTH DAILY WITH BREAKFAST 90 tablet 1  . glucose blood (FREESTYLE TEST STRIPS) test strip Use to check blood sugars twice a day 180 each 1  . ID NOW COVID-19 KIT See admin instructions.    . Lancets (FREESTYLE) lancets Use to check blood sugars two times daily. Dx Code: E11.9 200 each 3  . losartan (COZAAR) 50 MG tablet TAKE 1 TABLET(50 MG) BY MOUTH DAILY 90 tablet 2  . meloxicam (MOBIC) 15 MG tablet TAKE 1 TABLET(15 MG) BY MOUTH DAILY 30 tablet 3  . simvastatin (ZOCOR) 20 MG tablet TAKE 1 TABLET(20 MG) BY MOUTH AT BEDTIME 90 tablet 1  . sitaGLIPtin (JANUVIA) 100 MG tablet Take 1 tablet (100 mg total) by mouth daily. 90 tablet 3  . tamsulosin (FLOMAX) 0.4 MG CAPS capsule Take 0.4 mg by mouth daily.    . vitamin B-12 (CYANOCOBALAMIN) 1000 MCG tablet Take 1,000 mcg by mouth daily.     No facility-administered medications prior to visit.    PAST MEDICAL HISTORY: Past Medical History:  Diagnosis Date  . Acquired right foot drop    post lumbar surgery,  wears brace  . B12 deficiency   . Chronic constipation    diet  . ED (erectile dysfunction)   . History of kidney stones   . Hyperlipidemia   .  Hyperplasia of prostate with lower urinary tract symptoms (LUTS)   . Hypertension   . Insomnia   . Neuropathy, peripheral    right foot  . Presence of surgical incision 01/21/2019   s/p  left carpal tunnel release --  pt has dressing on,  denies pain or numbness  . Prostate cancer [facility]) urologist-- dr eskridge/  oncologist-  dr Tammi Klippel   dx 08-04-2018,  Stage T1c, Gleason 4+4  . Renal cyst, right   . Type 2 diabetes mellitus ([place])    followed by pcp  . Wears partial dentures    upper and lower    PAST SURGICAL HISTORY: Past Surgical History:  Procedure Laterality Date  . CARPAL TUNNEL RELEASE Bilateral left 01-21-2019;  right 1980s  . CYSTOSCOPY N/A 01/26/2019   Procedure: CYSTOSCOPY  FLEXIBLE;  Surgeon: Festus Aloe, MD;  Location: [facility];  Service: Urology;  Laterality: N/A;  no seeds found in bladder  . LUMBAR DISC SURGERY  1984  L4-5;  1998 L4--S1  . LUMBAR EPIDURAL INJECTION  2009, 2006   dr newton  . LUMBAR MICRODISCECTOMY  05-13-2002  dr Jenean Lindau  '@[facility]'    L4-5  . RADIOACTIVE SEED IMPLANT N/A 01/26/2019   Procedure: RADIOACTIVE SEED IMPLANT/BRACHYTHERAPY IMPLANT;  Surgeon: Festus Aloe, MD;  Location: [facility];  Service: Urology;  Laterality: N/A;    54 seeds implanted  . SPACE OAR INSTILLATION N/A 01/26/2019   Procedure: SPACE OAR INSTILLATION;  Surgeon: Festus Aloe, MD;  Location: [facility];  Service: Urology;  Laterality: N/A;    FAMILY HISTORY: Family History  Problem Relation Age of Onset  . Healthy Mother   . Prostate cancer Brother   . Diabetes Father   . Colon cancer Brother 42  . Prostate cancer Cousin        maternal  . Prostate cancer Nephew     SOCIAL HISTORY: Social History   Socioeconomic History  . Marital status: Married    Spouse name: Not on file  . Number of children: 2  . Years of education: 68  . Highest education level: Not on file  Occupational History    Comment: retired  Tobacco Use  . Smoking status: Former Smoker    Years: 20.00    Types: Cigarettes    Quit date: 01/25/2003    Years since quitting: 17.0  . Smokeless tobacco: Never Used  Vaping Use  . Vaping Use: Never used  Substance and Sexual Activity  . Alcohol use: Yes    Alcohol/week: 3.0 standard drinks    Types: 3 Glasses of wine per week  . Drug use: Never  . Sexual activity: Not on file  Other Topics Concern  . Not on file  Social History Narrative   HSG 2 years trade school. married 1965. 2 daughters- '66, '71. 2 grandchldren. 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   Social Determinants of Health   Financial Resource Strain:   . Difficulty of Paying Living  Expenses: Not on file  Food Insecurity:   . Worried About Charity fundraiser in the Last Year: Not on file  . Ran Out of Food in the Last Year: Not on file  Transportation Needs:   . Lack of Transportation (Medical): Not on file  . Lack of Transportation (Non-Medical): Not on file  Physical Activity:   . Days of Exercise per Week: Not on file  . Minutes of Exercise per Session: Not on file  Stress:   .  Feeling of Stress : Not on file  Social Connections:   . Frequency of Communication with Friends and Family: Not on file  . Frequency of Social Gatherings with Friends and Family: Not on file  . Attends Religious Services: Not on file  . Active Member of Clubs or Organizations: Not on file  . Attends Archivist Meetings: Not on file  . Marital Status: Not on file  Intimate Partner Violence:   . Fear of Current or Ex-Partner: Not on file  . Emotionally Abused: Not on file  . Physically Abused: Not on file  . Sexually Abused: Not on file     PHYSICAL EXAM  GENERAL EXAM/CONSTITUTIONAL: Vitals:  Vitals:   02/25/20 1132  BP: (!) 162/90  Pulse: 70  Weight: 159 lb (72.1 kg)  Height: '5\' 6"'  (1.676 m)     Body mass index is 25.66 kg/m. Wt Readings from Last 3 Encounters:  02/25/20 159 lb (72.1 kg)  11/25/19 158 lb 6 oz (71.8 kg)  05/28/19 158 lb (71.7 kg)     Patient is in no distress; well developed, nourished and groomed; neck is supple  CARDIOVASCULAR:  Examination of carotid arteries is normal; no carotid bruits  Regular rate and rhythm, no murmurs  Examination of peripheral vascular system by observation and palpation is normal  EYES:  Ophthalmoscopic exam of optic discs and posterior segments is normal; no papilledema or hemorrhages  No exam data present  MUSCULOSKELETAL:  Gait, strength, tone, movements noted in Neurologic exam below  NEUROLOGIC: MENTAL STATUS:  No flowsheet data found.  awake, alert, oriented to person, place and  time  recent and remote memory intact  normal attention and concentration  language fluent, comprehension intact, naming intact  fund of knowledge appropriate  CRANIAL NERVE:   2nd - no papilledema on fundoscopic exam  2nd, 3rd, 4th, 6th - pupils equal and reactive to light, visual fields full to confrontation, extraocular muscles intact, no nystagmus  5th - facial sensation symmetric  7th - facial strength symmetric  8th - hearing intact  9th - palate elevates symmetrically, uvula midline  11th - shoulder shrug symmetric  12th - tongue protrusion midline  MOTOR:   normal bulk and tone, full strength in the BUE, BLE; EXCEPT RIGHT FOOT DF, INVERSION, EVERSION 3/5  SENSORY:   normal and symmetric to light touch, temperature, vibration; EXCEPT DECR IN RIGHT FOOT  COORDINATION:   finger-nose-finger, fine finger movements normal  REFLEXES:   deep tendon reflexes TRACE and symmetric  GAIT/STATION:   narrow based gait     DIAGNOSTIC DATA (LABS, IMAGING, TESTING) - I reviewed patient records, labs, notes, testing and imaging myself where available.  Lab Results  Component Value Date   WBC 3.0 (L) 11/25/2019   HGB 10.9 (L) 11/25/2019   HCT 33.5 (L) 11/25/2019   MCV 81.3 11/25/2019   PLT 111.0 (L) 11/25/2019      Component Value Date/Time   NA 139 12/01/2019 0746   K 4.0 12/01/2019 0746   CL 105 12/01/2019 0746   CO2 27 12/01/2019 0746   GLUCOSE 185 (H) 12/01/2019 0746   BUN 16 12/01/2019 0746   CREATININE 0.85 12/01/2019 0746   CALCIUM 9.9 12/01/2019 0746   PROT 6.8 12/01/2019 0746   ALBUMIN 4.5 12/01/2019 0746   AST 13 12/01/2019 0746   ALT 11 12/01/2019 0746   ALKPHOS 89 12/01/2019 0746   BILITOT 0.5 12/01/2019 0746   GFRNONAA >60 01/22/2019 1014   GFRAA >60 01/22/2019  1014   Lab Results  Component Value Date   CHOL 149 12/01/2019   HDL 59.80 12/01/2019   LDLCALC 73 12/01/2019   TRIG 79.0 12/01/2019   CHOLHDL 2 12/01/2019   Lab Results   Component Value Date   HGBA1C 7.3 (H) 12/01/2019   Lab Results  Component Value Date   JNWMGEEA33 533 11/25/2019   Lab Results  Component Value Date   TSH 0.97 11/25/2019    08/03/07 MRI lumbar spine 1. Status post right laminotomy L4-5 and L5-S1 with good decompression of the thecal sac at both levels. There is some residual foraminal narrowing at both levels which appears most notable on the left at L5-S1.  2. Mild spondylosis L3-4 without central or foraminal narrowing.   07/17/14 MRI lumbar spine 1. Possible small right subarticular disc extrusion at L4-5 potentially affecting the right L5 nerve root. Some of this appearance could be postoperative in nature. Postcontrast imaging might behelpful for further differentiation.  2. Unchanged mild to moderate neural foraminal stenosis at L4-5 and moderate left neuralforaminal stenosis at L5-S1.   ASSESSMENT AND PLAN  79 y.o. year old male here with right low back pain, right foot numbness, right foot weakness (dorsiflexion, inversion, eversion) consistent with right L5 radiculopathy.  Dx:  1. Lumbosacral radiculopathy at L5   2. Right foot drop     PLAN:  CHRONIC RIGHT L5 RADICULOPATHY (per patient symptoms since 2012 back surgery; worsening over time) - follow up MRI lumbar spine (ordered by Dr. Ernestina Patches) - do not suspect peroneal neuropathy clinically; patient not interested in additional surgical mgmt; patient also has had prior lumbar surgeries; therefore EMG/NCS not likely to be beneficial in this clinical situation  Return for pending if symptoms worsen or fail to improve, return to PCP.    Penni Bombard, MD 1/74/0992, 78:00 AM Certified in Neurology, Neurophysiology and Neuroimaging  [facility] [address], [place], [place] [address] [phone]

## 2020-02-25 NOTE — Patient Instructions (Signed)
CHRONIC RIGHT L5 RADICULOPATHY (per patient symptoms since 2012 back surgery; worsening over time) - follow up MRI lumbar spine (ordered by Dr. Ernestina Patches)

## 2020-02-25 NOTE — Telephone Encounter (Signed)
Pt called stating he's been having a hard time getting scheduled for his CAT scan so I gave him the number to [facility] so he could set up his appt since they had reached out to him twice already.  [phone]

## 2020-02-26 ENCOUNTER — Other Ambulatory Visit: Payer: Self-pay

## 2020-02-26 ENCOUNTER — Ambulatory Visit
Admission: RE | Admit: 2020-02-26 | Discharge: 2020-02-26 | Disposition: A | Discharge status: Home / Self Care | Payer: Medicare Other | Source: Ambulatory Visit | Attending: Physical Medicine and Rehabilitation | Admitting: Physical Medicine and Rehabilitation

## 2020-02-26 DIAGNOSIS — M5127 Other intervertebral disc displacement, lumbosacral region: Secondary | ICD-10-CM | POA: Diagnosis not present

## 2020-02-26 DIAGNOSIS — M5126 Other intervertebral disc displacement, lumbar region: Secondary | ICD-10-CM | POA: Diagnosis not present

## 2020-02-26 DIAGNOSIS — C61 Malignant neoplasm of prostate: Secondary | ICD-10-CM | POA: Diagnosis not present

## 2020-02-26 DIAGNOSIS — M4856XA Collapsed vertebra, not elsewhere classified, lumbar region, initial encounter for fracture: Secondary | ICD-10-CM | POA: Diagnosis not present

## 2020-02-29 ENCOUNTER — Telehealth: Payer: Self-pay | Admitting: Physical Medicine and Rehabilitation

## 2020-02-29 DIAGNOSIS — H524 Presbyopia: Secondary | ICD-10-CM | POA: Diagnosis not present

## 2020-02-29 NOTE — Telephone Encounter (Signed)
Scheduled for MRI review.

## 2020-02-29 NOTE — Telephone Encounter (Signed)
Patient called asked if he can get a call back concerning his MRI results. The number to contact patient is [phone]  Home or [phone] Cell

## 2020-03-08 DIAGNOSIS — Z8546 Personal history of malignant neoplasm of prostate: Secondary | ICD-10-CM | POA: Diagnosis not present

## 2020-03-08 DIAGNOSIS — R351 Nocturia: Secondary | ICD-10-CM | POA: Diagnosis not present

## 2020-03-08 HISTORY — PX: CATARACT EXTRACTION: SUR2

## 2020-03-09 ENCOUNTER — Other Ambulatory Visit: Payer: Self-pay

## 2020-03-09 ENCOUNTER — Encounter: Payer: Self-pay | Admitting: Physical Medicine and Rehabilitation

## 2020-03-09 ENCOUNTER — Ambulatory Visit (INDEPENDENT_AMBULATORY_CARE_PROVIDER_SITE_OTHER): Payer: Medicare Other | Admitting: Physical Medicine and Rehabilitation

## 2020-03-09 VITALS — BP 135/80 | HR 78

## 2020-03-09 DIAGNOSIS — M5416 Radiculopathy, lumbar region: Secondary | ICD-10-CM | POA: Diagnosis not present

## 2020-03-09 DIAGNOSIS — M21371 Foot drop, right foot: Secondary | ICD-10-CM | POA: Diagnosis not present

## 2020-03-09 DIAGNOSIS — M961 Postlaminectomy syndrome, not elsewhere classified: Secondary | ICD-10-CM

## 2020-03-09 DIAGNOSIS — S32030A Wedge compression fracture of third lumbar vertebra, initial encounter for closed fracture: Secondary | ICD-10-CM

## 2020-03-09 DIAGNOSIS — R202 Paresthesia of skin: Secondary | ICD-10-CM

## 2020-03-09 NOTE — Progress Notes (Signed)
Here for MRI review. Right foot pain and right sided lower back pain.  Numeric Pain Rating Scale and Functional Assessment Average Pain 8   In the last MONTH (on 0-10 scale) has pain interfered with the following?  1. General activity like being  able to carry out your everyday physical activities such as walking, climbing stairs, carrying groceries, or moving a chair?  Rating(9)

## 2020-03-16 ENCOUNTER — Ambulatory Visit (INDEPENDENT_AMBULATORY_CARE_PROVIDER_SITE_OTHER): Payer: Medicare Other | Admitting: Physical Medicine and Rehabilitation

## 2020-03-16 ENCOUNTER — Ambulatory Visit: Payer: Self-pay

## 2020-03-16 ENCOUNTER — Other Ambulatory Visit: Payer: Self-pay

## 2020-03-16 VITALS — BP 158/82 | HR 71

## 2020-03-16 DIAGNOSIS — M5416 Radiculopathy, lumbar region: Secondary | ICD-10-CM | POA: Diagnosis not present

## 2020-03-16 DIAGNOSIS — M961 Postlaminectomy syndrome, not elsewhere classified: Secondary | ICD-10-CM

## 2020-03-16 MED ORDER — METHYLPREDNISOLONE ACETATE 80 MG/ML IJ SUSP
80.0000 mg | Freq: Once | INTRAMUSCULAR | Status: AC
  Administered 2020-03-16: 80 mg

## 2020-03-16 NOTE — Progress Notes (Signed)
Pt state lower back pain that travel down his right foot with numbness. Pt state at night when his trying to sleep he feels pain and numbness in his right foot. Pt state his back has been feeling better. Pt has hx of inj on 01/20/20 pt state that the shot didn't work, pt state the shot lasted for two weeks.  Numeric Pain Rating Scale and Functional Assessment Average Pain 8   In the last MONTH (on 0-10 scale) has pain interfered with the following?  1. General activity like being  able to carry out your everyday physical activities such as walking, climbing stairs, carrying groceries, or moving a chair?  Rating(7)   +Driver, -BT, -Dye Allergies.

## 2020-03-16 NOTE — Progress Notes (Signed)
Alex Massey - 79 y.o. male MRN [id number]  Date of birth: 1941-02-02  Office Visit Note: Visit Date: 03/16/2020 PCP: Biagio Borg, MD Referred by: Biagio Borg, MD  Subjective: Chief Complaint  Patient presents with  . Lower Back - Pain  . Right Foot - Numbness   HPI:  Alex Massey is a 79 y.o. male who comes in today at the request of Dr. Laurence Spates for planned Right L5-S1 Lumbar epidural steroid injection with fluoroscopic guidance.  The patient has failed conservative care including home exercise, medications, time and activity modification.  This injection will be diagnostic and hopefully therapeutic.  Please see requesting physician notes for further details and justification.   ROS Otherwise per HPI.  Assessment & Plan: Visit Diagnoses:  1. Lumbar radiculopathy   2. Post laminectomy syndrome     Plan: No additional findings.   Meds & Orders:  Meds ordered this encounter  Medications  . methylPREDNISolone acetate (DEPO-MEDROL) injection 80 mg    Orders Placed This Encounter  Procedures  . XR C-ARM NO REPORT  . Epidural Steroid injection    Follow-up: Return if symptoms worsen or fail to improve.   Procedures: No procedures performed  Lumbosacral Transforaminal Epidural Steroid Injection - Sub-Pedicular Approach with Fluoroscopic Guidance  Patient: JONATHAN CORPUS      Date of Birth: Dec 20, 1940 MRN: [id number] PCP: Biagio Borg, MD      Visit Date: 03/16/2020   Universal Protocol:    Date/Time: 03/16/2020  Consent Given By: the patient  Position: PRONE  Additional Comments: Vital signs were monitored before and after the procedure. Patient was prepped and draped in the usual sterile fashion. The correct patient, procedure, and site was verified.   Injection Procedure Details:  Procedure Site One Meds Administered:  Meds ordered this encounter  Medications  . methylPREDNISolone acetate (DEPO-MEDROL) injection 80 mg    Laterality:  Left  Location/Site:  L5-S1  Needle size: 22 G  Needle type: Spinal  Needle Placement: Transforaminal  Findings:    -Comments: Excellent flow of contrast along the nerve, nerve root and into the epidural space.  Procedure Details: After squaring off the end-plates to get a true AP view, the C-arm was positioned so that an oblique view of the foramen as noted above was visualized. The target area is just inferior to the "nose of the scotty dog" or sub pedicular. The soft tissues overlying this structure were infiltrated with 2-3 ml. of 1% Lidocaine without Epinephrine.  The spinal needle was inserted toward the target using a "trajectory" view along the fluoroscope beam.  Under AP and lateral visualization, the needle was advanced so it did not puncture dura and was located close the 6 O'Clock position of the pedical in AP tracterory. Biplanar projections were used to confirm position. Aspiration was confirmed to be negative for CSF and/or blood. A 1-2 ml. volume of Isovue-250 was injected and flow of contrast was noted at each level. Radiographs were obtained for documentation purposes.   After attaining the desired flow of contrast documented above, a 0.5 to 1.0 ml test dose of 0.25% Marcaine was injected into each respective transforaminal space.  The patient was observed for 90 seconds post injection.  After no sensory deficits were reported, and normal lower extremity motor function was noted,   the above injectate was administered so that equal amounts of the injectate were placed at each foramen (level) into the transforaminal epidural space.   Additional Comments:  The patient tolerated the procedure well Dressing: 2 x 2 sterile gauze and Band-Aid    Post-procedure details: Patient was observed during the procedure. Post-procedure instructions were reviewed.  Patient left the clinic in stable condition.      Clinical History: MRI LUMBAR SPINE WITHOUT  CONTRAST  TECHNIQUE: Multiplanar, multisequence MR imaging of the lumbar spine was performed. No intravenous contrast was administered.  COMPARISON:  08/02/2007  FINDINGS: Straightening of the normal lumbar lordosis is unchanged. There is no significant listhesis. Vertebral body heights are preserved. There is disc desiccation and severe disc space narrowing at L5-S1 with mild narrowing at L4-5. Mild degenerative marrow changes are present at L5-S1. Subcentimeter T1 hypointense focus in the posterior L3 vertebral body is unchanged and likely benign. Conus medullaris is normal in signal and terminates at L1. Paraspinal soft tissues are unremarkable.  L1-2:  Negative.  L2-3:  Mild disc bulging without stenosis, unchanged.  L3-4: Mild disc bulging and mild facet and ligamentum flavum hypertrophy result in mild right neural foraminal stenosis, minimally increased from prior.  L4-5: Prior right hemilaminectomy. Circumferential disc bulging slightly asymmetric to the right is again seen with suggestion of an increased although small right subarticular disc extrusion which may affect the right L5 nerve root in the lateral recess. A componenet of postoperative change in this region is also possible. There is no spinal stenosis. Circumferential disc bulging results in mild to moderate right greater than left neural foraminal stenosis, unchanged.  L5-S1: Prior right hemilaminectomy. Mild circumferential disc bulging results in moderate left neural foraminal stenosis, unchanged. No spinal stenosis.  IMPRESSION: 1. Possible small right subarticular disc extrusion at L4-5 potentially affecting the right L5 nerve root. Some of this appearance could be postoperative in nature. Postcontrast imaging might be helpful for further differentiation. 2. Unchanged mild to moderate neural foraminal stenosis at L4-5 and moderate left neural foraminal stenosis at  L5-S1.   Electronically Signed   By: Logan Bores   On: 07/17/2014 17:43     Objective:  VS:  HT:    WT:   BMI:     BP:(!) 158/82  HR:71bpm  TEMP: ( )  RESP:  Physical Exam Constitutional:      General: He is not in acute distress.    Appearance: Normal appearance. He is not ill-appearing.  HENT:     Head: Normocephalic and atraumatic.     Right Ear: External ear normal.     Left Ear: External ear normal.  Eyes:     Extraocular Movements: Extraocular movements intact.  Cardiovascular:     Rate and Rhythm: Normal rate.     Pulses: Normal pulses.  Abdominal:     General: There is no distension.     Palpations: Abdomen is soft.  Musculoskeletal:        General: No tenderness or signs of injury.     Right lower leg: No edema.     Left lower leg: No edema.     Comments: Patient with mild weakness with dorsiflexion EHL on the right.  He does have decreased sensation in L5 dermatome on the right.  Good proximal strength.  Skin:    Findings: No erythema or rash.  Neurological:     General: No focal deficit present.     Mental Status: He is alert and oriented to person, place, and time.     Sensory: No sensory deficit.     Motor: No weakness or abnormal muscle tone.     Coordination: Coordination  normal.  Psychiatric:        Mood and Affect: Mood normal.        Behavior: Behavior normal.      Imaging: No results found.

## 2020-03-22 NOTE — Procedures (Signed)
Lumbosacral Transforaminal Epidural Steroid Injection - Sub-Pedicular Approach with Fluoroscopic Guidance  Patient: Alex Massey      Date of Birth: [birth year] MRN: [id number] PCP: Biagio Borg, MD      Visit Date: 03/16/2020   Universal Protocol:    Date/Time: 03/16/2020  Consent Given By: the patient  Position: PRONE  Additional Comments: Vital signs were monitored before and after the procedure. Patient was prepped and draped in the usual sterile fashion. The correct patient, procedure, and site was verified.   Injection Procedure Details:  Procedure Site One Meds Administered:  Meds ordered this encounter  Medications  . methylPREDNISolone acetate (DEPO-MEDROL) injection 80 mg    Laterality: Left  Location/Site:  L5-S1  Needle size: 22 G  Needle type: Spinal  Needle Placement: Transforaminal  Findings:    -Comments: Excellent flow of contrast along the nerve, nerve root and into the epidural space.  Procedure Details: After squaring off the end-plates to get a true AP view, the C-arm was positioned so that an oblique view of the foramen as noted above was visualized. The target area is just inferior to the "nose of the scotty dog" or sub pedicular. The soft tissues overlying this structure were infiltrated with 2-3 ml. of 1% Lidocaine without Epinephrine.  The spinal needle was inserted toward the target using a "trajectory" view along the fluoroscope beam.  Under AP and lateral visualization, the needle was advanced so it did not puncture dura and was located close the 6 O'Clock position of the pedical in AP tracterory. Biplanar projections were used to confirm position. Aspiration was confirmed to be negative for CSF and/or blood. A 1-2 ml. volume of Isovue-250 was injected and flow of contrast was noted at each level. Radiographs were obtained for documentation purposes.   After attaining the desired flow of contrast documented above, a 0.5 to 1.0 ml test  dose of 0.25% Marcaine was injected into each respective transforaminal space.  The patient was observed for 90 seconds post injection.  After no sensory deficits were reported, and normal lower extremity motor function was noted,   the above injectate was administered so that equal amounts of the injectate were placed at each foramen (level) into the transforaminal epidural space.   Additional Comments:  The patient tolerated the procedure well Dressing: 2 x 2 sterile gauze and Band-Aid    Post-procedure details: Patient was observed during the procedure. Post-procedure instructions were reviewed.  Patient left the clinic in stable condition.

## 2020-03-25 ENCOUNTER — Encounter: Payer: Self-pay | Admitting: Physical Medicine and Rehabilitation

## 2020-03-25 NOTE — Progress Notes (Signed)
Alex Massey - 79 y.o. male MRN [id number]  Date of birth: Oct 16, 1940  Office Visit Note: Visit Date: 03/09/2020 PCP: Biagio Borg, MD Referred by: Biagio Borg, MD  Subjective: Chief Complaint  Patient presents with  . Lower Back - Pain   HPI: Alex Massey is a 79 y.o. male who comes in today For evaluation management of continued worsening low back pain but also history of chronic worsening right lower extremity paresthesia and chronic foot drop.  Patient does wear AFO.  He is not wearing his AFO today to show me that he has tried to not wear it for a few days and he has noticed that he does drag his toe at times.  He denies any trips or falls.  I encouraged him to wear the AFO and if he needs to get that looked at once again at [facility] that he can do that.  They have given him a new AFOs and they have looked at modifying that to a degree.  Some of his complaints about the AFO I think are unwarranted as he feels like maybe this is causing the numbness and tingling in the foot and I do not think that is the actual case and we have tried to explain that to him.  Nonetheless he comes in today after having had MRI of the lumbar spine just to see if there is anything new new process going on for his increasing back and hip and leg pain.  Prior S1 injections have not been as beneficial as they have been in the past.  MRI is reviewed with the patient today along with spine models.  He has had no new issues or focal weakness he has had no new changes of bowel or bladder dysfunction etc.   MRI did show small subacute L3 compression fracture with no retropulsion etc.  I did show the patient this today and explained this at length.  It did not show any focal narrowing or new disc herniations to explain the right foot numbness other than the recalcitrant numbness that he has had.   He has a complicated case with diabetes.  Has had review with Dr. Leta Baptist he did not feel like he needed  electrodiagnostic study.  His last hemoglobin A1c was 6.2.  Review of Systems  Musculoskeletal: Positive for back pain and joint pain.  Neurological: Positive for tingling and focal weakness.  All other systems reviewed and are negative.  Otherwise per HPI.  Assessment & Plan: Visit Diagnoses:  1. Lumbar radiculopathy   2. Post laminectomy syndrome   3. Paresthesia of skin   4. Right foot drop   5. Closed compression fracture of L3 vertebra, initial encounter (HCC)     Plan: Findings:  Chronic recalcitrant low back pain with right hip and leg pain more than left with paresthesia in the right foot more of an L5 distribution with clear L5 related foot drop.  Has had prior lumbar surgery remotely with foot drop post surgery.  Does have AFO and have asked him to go ahead and have that looked at once again to see if they can get an AFO that he feels comfortable with.  We talked again at length which we have done several times on while the AFO is present and what it helps to do and not do.  MRI is reviewed with the patient today and does not show really any new process for the numbness and weakness on the right.Marland Kitchen  I do think L5 transforaminal injection may help diagnostically and I would recommend that at this point he does want to try 1 more injection.  We have been looking at medication management and we have continued to slowly increase his gabapentin.  Alternative approaches obviously are spinal cord stimulator trial.  L3 compression fracture which is mild without retropulsion.  This will heal on its own in the patient's pain complaints of increasing back pain probably were due to this compression fracture.  He does not have any noted falls etc.  There was no worrisome signs noted on the MRI of the fracture.  Again we discussed this at length and continued time will heal this.  This note will go back to his primary care physician Dr. Cathlean Cower and I told the patient at length that the probably  should look at his bone density.    Meds & Orders: No orders of the defined types were placed in this encounter.  No orders of the defined types were placed in this encounter.   Follow-up: Return for Right L5 transforaminal epidural steroid injection..   Procedures: No procedures performed  No notes on file   Clinical History: MRI LUMBAR SPINE WITHOUT CONTRAST  TECHNIQUE: Multiplanar, multisequence MR imaging of the lumbar spine was performed. No intravenous contrast was administered.  COMPARISON:  07/17/2014  FINDINGS: Segmentation:  Standard lumbar numbering  Alignment:  Physiologic  Vertebrae: Horizontal marrow edema with hypointense fracture line at L3. Height loss is minimal. No evidence of underlying lesion. Prominent fatty marrow at the level of the lower lumbar spine and sacrum, chronic and presumably from prostate cancer treatment.  Conus medullaris and cauda equina: Conus extends to the L1 level. Conus and cauda equina appear normal.  Paraspinal and other soft tissues: Scarring at the level of L4 and L5.  Disc levels:  T12- L1: Unremarkable.  L1-L2: Unremarkable.  L2-L3: Mild disc narrowing and bulging.  L3-L4: Disc narrowing and bulging borderline facet spurring.  L4-L5: Disc narrowing and right eccentric bulging. Prior right-sided laminotomy. The canal and foramina are patent  L5-S1:Greatest level of disc narrowing with bulging and ridging. Negative facets. Noncompressive left foraminal narrowing. Patent spinal canal.  IMPRESSION: 1. L3 compression fracture with slight height loss. 2. Noncompressive disc degeneration as described. Prior right-sided decompression at L4-5.   Electronically Signed   By: Monte Fantasia M.D.   On: 02/27/2020 09:12   He reports that he quit smoking about 17 years ago. His smoking use included cigarettes. He quit after 20.00 years of use. He has never used smokeless tobacco.  Recent Labs     05/28/19 0953 11/25/19 1032 12/01/19 0746  HGBA1C 6.2* 7.2* 7.3*    Objective:  VS:  HT:    WT:   BMI:     BP:135/80  HR:78bpm  TEMP: ( )  RESP:  Physical Exam Vitals and nursing note reviewed.  Constitutional:      General: He is not in acute distress.    Appearance: Normal appearance. He is well-developed.  HENT:     Head: Normocephalic and atraumatic.  Eyes:     Conjunctiva/sclera: Conjunctivae normal.     Pupils: Pupils are equal, round, and reactive to light.  Cardiovascular:     Rate and Rhythm: Normal rate.     Pulses: Normal pulses.     Heart sounds: Normal heart sounds.  Pulmonary:     Effort: Pulmonary effort is normal. No respiratory distress.  Musculoskeletal:     Cervical back:  Normal range of motion and neck supple. No rigidity.     Right lower leg: No edema.     Left lower leg: No edema.     Comments: Patient is slow to go from sit to stand he does have concordant back pain with facet loading.  He does have pain over the lower lumbar vertebral body.  He has no pain over the PSIS or greater trochanters.  No pain with hip rotation.  He has good strength on the left he has decreased strength on the right with dorsiflexion although he can resist motion pretty well.  He does ambulate with a forward flexed lumbar spine and he is having some toe dragging with ambulation.  Skin:    General: Skin is warm and dry.     Findings: No erythema or rash.  Neurological:     General: No focal deficit present.     Mental Status: He is alert and oriented to person, place, and time.     Sensory: Sensory deficit present.     Motor: Weakness present.     Coordination: Coordination normal.     Gait: Gait abnormal.  Psychiatric:        Mood and Affect: Mood normal.        Behavior: Behavior normal.     Ortho Exam  Imaging: No results found.  Past Medical/Family/Surgical/Social History: Medications & Allergies reviewed per EMR, new medications updated. Patient Active  Problem List   Diagnosis Date Noted  . Right foot drop 02/02/2020  . Paresthesia of skin 02/02/2020  . Status post carpal tunnel release 02/04/2019  . Left carpal tunnel syndrome 11/29/2018  . Malignant neoplasm of prostate ([place]) 09/03/2018  . Vitamin B12 deficiency 02/13/2018  . Lumbar radiculopathy 07/09/2016  . Radiculopathy due to lumbar intervertebral disc disorder 07/09/2016  . Hemorrhoid 08/07/2015  . BPH without obstruction/lower urinary tract symptoms 02/01/2015  . Essential hypertension 09/12/2014  . Routine general medical examination at a health care facility 01/14/2011  . ERECTILE DYSFUNCTION 04/04/2008  . Hyperlipidemia 06/26/2007  . Diabetes ([place]) 06/25/2007  . NEPHROLITHIASIS, HX OF 06/25/2007   Past Medical History:  Diagnosis Date  . Acquired right foot drop    post lumbar surgery,  wears brace  . B12 deficiency   . Chronic constipation    diet  . ED (erectile dysfunction)   . History of kidney stones   . Hyperlipidemia   . Hyperplasia of prostate with lower urinary tract symptoms (LUTS)   . Hypertension   . Insomnia   . Neuropathy, peripheral    right foot  . Presence of surgical incision 01/21/2019   s/p  left carpal tunnel release --  pt has dressing on,  denies pain or numbness  . Prostate cancer [facility]) urologist-- dr eskridge/  oncologist-  dr Tammi Klippel   dx 08-04-2018,  Stage T1c, Gleason 4+4  . Renal cyst, right   . Type 2 diabetes mellitus ([place])    followed by pcp  . Wears partial dentures    upper and lower   Family History  Problem Relation Age of Onset  . Healthy Mother   . Prostate cancer Brother   . Diabetes Father   . Colon cancer Brother 37  . Prostate cancer Cousin        maternal  . Prostate cancer Nephew    Past Surgical History:  Procedure Laterality Date  . CARPAL TUNNEL RELEASE Bilateral left 01-21-2019;  right 1980s  . CYSTOSCOPY N/A 01/26/2019   Procedure:  CYSTOSCOPY FLEXIBLE;  Surgeon: Festus Aloe, MD;  Location:  [facility];  Service: Urology;  Laterality: N/A;  no seeds found in bladder  . LUMBAR DISC SURGERY  1984  L4-5;  1998 L4--S1  . LUMBAR EPIDURAL INJECTION  2009, 2006   dr Keshonna Valvo  . LUMBAR MICRODISCECTOMY  05-13-2002  dr Jenean Lindau  @[facility]    L4-5  . RADIOACTIVE SEED IMPLANT N/A 01/26/2019   Procedure: RADIOACTIVE SEED IMPLANT/BRACHYTHERAPY IMPLANT;  Surgeon: Festus Aloe, MD;  Location: [facility];  Service: Urology;  Laterality: N/A;    54 seeds implanted  . SPACE OAR INSTILLATION N/A 01/26/2019   Procedure: SPACE OAR INSTILLATION;  Surgeon: Festus Aloe, MD;  Location: [facility];  Service: Urology;  Laterality: N/A;   Social History   Occupational History    Comment: retired  Tobacco Use  . Smoking status: Former Smoker    Years: 20.00    Types: Cigarettes    Quit date: 01/25/2003    Years since quitting: 17.1  . Smokeless tobacco: Never Used  Vaping Use  . Vaping Use: Never used  Substance and Sexual Activity  . Alcohol use: Yes    Alcohol/week: 3.0 standard drinks    Types: 3 Glasses of wine per week  . Drug use: Never  . Sexual activity: Not on file

## 2020-03-30 DIAGNOSIS — H25812 Combined forms of age-related cataract, left eye: Secondary | ICD-10-CM | POA: Diagnosis not present

## 2020-03-30 DIAGNOSIS — H25012 Cortical age-related cataract, left eye: Secondary | ICD-10-CM | POA: Diagnosis not present

## 2020-03-30 DIAGNOSIS — H2512 Age-related nuclear cataract, left eye: Secondary | ICD-10-CM | POA: Diagnosis not present

## 2020-04-05 ENCOUNTER — Telehealth: Payer: Self-pay

## 2020-04-05 ENCOUNTER — Encounter: Payer: Self-pay | Admitting: Gastroenterology

## 2020-04-05 ENCOUNTER — Ambulatory Visit: Payer: Medicare Other | Admitting: Gastroenterology

## 2020-04-05 VITALS — BP 128/80 | HR 70 | Ht 66.0 in | Wt 156.1 lb

## 2020-04-05 DIAGNOSIS — Z86010 Personal history of colonic polyps: Secondary | ICD-10-CM | POA: Diagnosis not present

## 2020-04-05 MED ORDER — PLENVU 140 G PO SOLR
140.0000 g | ORAL | 0 refills | Status: DC
Start: 2020-04-05 — End: 2020-04-05

## 2020-04-05 MED ORDER — PLENVU 140 G PO SOLR: 140.0000 g | ORAL | 0 refills | Status: DC

## 2020-04-05 NOTE — Telephone Encounter (Signed)
Resent colon prep with manufacturer code.

## 2020-04-05 NOTE — Progress Notes (Signed)
[facility] Consult Note:  History: Alex Massey 04/05/2020  Referring provider: Biagio Borg, MD  Reason for consult/chief complaint: Colonoscopy (patient is here to discuss colonoscopy )   Subjective  HPI:  This is a very pleasant man here to discuss colon polyp surveillance and family history of colon cancer  Last colonoscopy by Dr. Olevia Perches July 2016 for family history of colon cancer in brother.  Severe left-sided diverticulosis, internal hemorrhoids and subcentimeter rectal tubular adenoma.  Recommendation was for a repeat exam in 5 years if patient's overall health was good. No adenomatous polyps on 2006 colonoscopy.  Hever feels that his health is good overall.  He is active, denies chest pain dyspnea abdominal pain altered bowel habits or rectal bleeding   Past Medical History: Past Medical History:  Diagnosis Date  . Acquired right foot drop    post lumbar surgery,  wears brace  . B12 deficiency   . Chronic constipation    diet  . ED (erectile dysfunction)   . History of kidney stones   . Hyperlipidemia   . Hyperplasia of prostate with lower urinary tract symptoms (LUTS)   . Hypertension   . Insomnia   . Neuropathy, peripheral    right foot  . Presence of surgical incision 01/21/2019   s/p  left carpal tunnel release --  pt has dressing on,  denies pain or numbness  . Prostate cancer [facility]) urologist-- dr eskridge/  oncologist-  dr Tammi Klippel   dx 08-04-2018,  Stage T1c, Gleason 4+4  . Renal cyst, right   . Type 2 diabetes mellitus ([place])    followed by pcp  . Wears partial dentures    upper and lower     Past Surgical History: Past Surgical History:  Procedure Laterality Date  . CARPAL TUNNEL RELEASE Bilateral left 01-21-2019;  right 1980s  . CATARACT EXTRACTION Left 03/2020  . CYSTOSCOPY N/A 01/26/2019   Procedure: CYSTOSCOPY FLEXIBLE;  Surgeon: Festus Aloe, MD;  Location[facility];  Service: Urology;   Laterality: N/A;  no seeds found in bladder  . LUMBAR DISC SURGERY  1984  L4-5;  1998 L4--S1  . LUMBAR EPIDURAL INJECTION  2009, 2006   dr newton  . LUMBAR MICRODISCECTOMY  05-13-2002  dr Jenean Lindau  _0    L4-5  . RADIOACTIVE SEED IMPLANT N/A 01/26/2019   Procedure: RADIOACTIVE SEED IMPLANT/BRACHYTHERAPY IMPLANT;  Surgeon: Festus Aloe, MD;  Location: [facility];  Service: Urology;  Laterality: N/A;    54 seeds implanted  . SPACE OAR INSTILLATION N/A 01/26/2019   Procedure: SPACE OAR INSTILLATION;  Surgeon: Festus Aloe, MD;  Location: [facility];  Service: Urology;  Laterality: N/A;     Family History: Family History  Problem Relation Age of Onset  . Healthy Mother   . Prostate cancer Brother   . Diabetes Father   . Colon cancer Brother 22  . Prostate cancer Cousin        maternal  . Prostate cancer Nephew     Social History: Social History   Socioeconomic History  . Marital status: Married    Spouse name: Not on file  . Number of children: 2  . Years of education: 19  . Highest education level: Not on file  Occupational History    Comment: retired  Tobacco Use  . Smoking status: Former Smoker    Years: 20.00    Types: Cigarettes    Quit date: 01/25/2003    Years since  quitting: 17.2  . Smokeless tobacco: Never Used  Vaping Use  . Vaping Use: Never used  Substance and Sexual Activity  . Alcohol use: Yes    Alcohol/week: 3.0 standard drinks    Types: 3 Glasses of wine per week  . Drug use: Never  . Sexual activity: Not on file  Other Topics Concern  . Not on file  Social History Narrative   HSG 2 years trade school. married 1965. 2 daughters- '66, '71. 2 grandchldren. 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   Social Determinants of Health   Financial Resource Strain:   . Difficulty of Paying Living Expenses: Not on file  Food Insecurity:   . Worried About Charity fundraiser in the Last Year: Not  on file  . Ran Out of Food in the Last Year: Not on file  Transportation Needs:   . Lack of Transportation (Medical): Not on file  . Lack of Transportation (Non-Medical): Not on file  Physical Activity:   . Days of Exercise per Week: Not on file  . Minutes of Exercise per Session: Not on file  Stress:   . Feeling of Stress : Not on file  Social Connections:   . Frequency of Communication with Friends and Family: Not on file  . Frequency of Social Gatherings with Friends and Family: Not on file  . Attends Religious Services: Not on file  . Active Member of Clubs or Organizations: Not on file  . Attends Archivist Meetings: Not on file  . Marital Status: Not on file    Allergies: Allergies  Allergen Reactions  . Metformin And Related Other (See Comments)    GI upset, diarrhea, wt loss    Outpatient Meds: Current Outpatient Medications  Medication Sig Dispense Refill  . acarbose (PRECOSE) 50 MG tablet TAKE 1 TABLET BY MOUTH THREE TIMES DAILY WITH MEALS 270 tablet 1  . aspirin EC 81 MG tablet Take 1 tablet (81 mg total) by mouth daily. 150 tablet 2  . blood glucose meter kit and supplies KIT Inject 1 each into the skin 2 (two) times daily. Dispense based on patient and insurance preference. Use up to four times daily as directed. (FOR ICD-9 250.00, 250.01). 1 each 0  . Blood Glucose Monitoring Suppl (FREESTYLE LITE) DEVI USE TO CHECK GLUCOSE UP TO 4 TIMES DAILY AS DIRECTED    . gabapentin (NEURONTIN) 600 MG tablet Take 0.5-1 tablets (300-600 mg total) by mouth 2 (two) times daily. 120 tablet 3  . glipiZIDE (GLUCOTROL XL) 10 MG 24 hr tablet TAKE 1 TABLET BY MOUTH DAILY WITH BREAKFAST 90 tablet 1  . glucose blood (FREESTYLE TEST STRIPS) test strip Use to check blood sugars twice a day 180 each 1  . ID NOW COVID-19 KIT See admin instructions.    . Lancets (FREESTYLE) lancets Use to check blood sugars two times daily. Dx Code: E11.9 200 each 3  . losartan (COZAAR) 50 MG tablet  TAKE 1 TABLET(50 MG) BY MOUTH DAILY 90 tablet 2  . meloxicam (MOBIC) 15 MG tablet TAKE 1 TABLET(15 MG) BY MOUTH DAILY 30 tablet 3  . simvastatin (ZOCOR) 20 MG tablet TAKE 1 TABLET(20 MG) BY MOUTH AT BEDTIME 90 tablet 1  . sitaGLIPtin (JANUVIA) 100 MG tablet Take 1 tablet (100 mg total) by mouth daily. 90 tablet 3  . tamsulosin (FLOMAX) 0.4 MG CAPS capsule Take 0.4 mg by mouth daily.    . vitamin B-12 (CYANOCOBALAMIN) 1000 MCG tablet Take  1,000 mcg by mouth daily.    Marland Kitchen PEG-KCl-NaCl-NaSulf-Na Asc-C (PLENVU) 140 g SOLR Take 140 g by mouth as directed. 1 each 0   No current facility-administered medications for this visit.      ___________________________________________________________________ Objective   Exam:  BP 128/80   Pulse 70   Ht _0  (1.676 m)   Wt 156 lb 2 oz (70.8 kg)   BMI 25.20 kg/m    General: Well-appearing  Eyes: sclera anicteric, no redness  ENT: oral mucosa moist without lesions, no cervical or supraclavicular lymphadenopathy  CV: RRR without murmur, S1/S2, no JVD, no peripheral edema  Resp: clear to auscultation bilaterally, normal RR and effort noted  GI: soft, no tenderness, with active bowel sounds. No guarding or palpable organomegaly noted.  Skin; warm and dry, no rash or jaundice noted  Neuro: awake, alert and oriented x 3. Normal gross motor function and fluent speech  CBC Latest Ref Rng & Units 11/25/2019 01/22/2019 11/24/2018  WBC 4.0 - 10.5 K/uL 3.0(L) 5.1 5.0  Hemoglobin 13.0 - 17.0 g/dL 10.9(L) 11.4(L) 12.9(L)  Hematocrit 39 - 52 % 33.5(L) 37.2(L) 38.8(L)  Platelets 150 - 400 K/uL 111.0(L) 122(L) 123.0(L)     Assessment: Encounter Diagnosis  Name Primary?  . Personal history of colonic polyps Yes    I recommended a surveillance colonoscopy given his previous colon polyp and first-degree relative with colon cancer.  He was agreeable after discussion of procedure and risks.  The benefits and risks of the planned procedure were described  in detail with the patient or (when appropriate) their health care proxy.  Risks were outlined as including, but not limited to, bleeding, infection, perforation, adverse medication reaction leading to cardiac or pulmonary decompensation, pancreatitis (if ERCP).  The limitation of incomplete mucosal visualization was also discussed.  No guarantees or warranties were given.  If he has few or no polyps, this will most likely be his last routine colonoscopy.  Despite his family history, Jarquez has had low risk findings on prior scopes.  Thank you for the courtesy of this consult.  Please call me with any questions or concerns.  Nelida Meuse III  CC: Referring provider noted above

## 2020-04-05 NOTE — Patient Instructions (Signed)
If you are age 78 or older, your body mass index should be between 23-30. Your Body mass index is 25.2 kg/m. If this is out of the aforementioned range listed, please consider follow up with your Primary Care Provider.  If you are age 78 or younger, your body mass index should be between 19-25. Your Body mass index is 25.2 kg/m. If this is out of the aformentioned range listed, please consider follow up with your Primary Care Provider.   You have been scheduled for a colonoscopy. Please follow written instructions given to you at your visit today.  Please pick up your prep supplies at the pharmacy within the next 1-3 days. If you use inhalers (even only as needed), please bring them with you on the day of your procedure.  It was a pleasure to see you today!  Dr. Loletha Carrow

## 2020-04-06 ENCOUNTER — Ambulatory Visit (INDEPENDENT_AMBULATORY_CARE_PROVIDER_SITE_OTHER): Payer: Medicare Other

## 2020-04-06 ENCOUNTER — Other Ambulatory Visit: Payer: Self-pay

## 2020-04-06 DIAGNOSIS — Z23 Encounter for immunization: Secondary | ICD-10-CM | POA: Diagnosis not present

## 2020-05-02 ENCOUNTER — Other Ambulatory Visit: Payer: Self-pay | Admitting: Internal Medicine

## 2020-05-02 NOTE — Telephone Encounter (Signed)
Please refill as per office routine med refill policy (all routine meds refilled for 3 mo or monthly per pt preference up to one year from last visit, then month to month grace period for 3 mo, then further med refills will have to be denied)  

## 2020-05-11 DIAGNOSIS — H2511 Age-related nuclear cataract, right eye: Secondary | ICD-10-CM | POA: Diagnosis not present

## 2020-05-11 DIAGNOSIS — H25011 Cortical age-related cataract, right eye: Secondary | ICD-10-CM | POA: Diagnosis not present

## 2020-05-11 DIAGNOSIS — H25811 Combined forms of age-related cataract, right eye: Secondary | ICD-10-CM | POA: Diagnosis not present

## 2020-05-15 ENCOUNTER — Other Ambulatory Visit: Payer: Self-pay

## 2020-05-15 ENCOUNTER — Telehealth: Payer: Self-pay | Admitting: Internal Medicine

## 2020-05-15 MED ORDER — GLIPIZIDE ER 10 MG PO TB24
10.0000 mg | ORAL_TABLET | Freq: Every day | ORAL | 1 refills | Status: DC
Start: 2020-05-15 — End: 2020-05-18

## 2020-05-15 NOTE — Telephone Encounter (Signed)
    Patient requesting refill for glipiZIDE (GLUCOTROL XL) 10 MG 24 hr tablet Pharmacy: [facility] ([place]), [place] - [address]

## 2020-05-16 ENCOUNTER — Other Ambulatory Visit: Payer: Self-pay | Admitting: Internal Medicine

## 2020-05-16 NOTE — Telephone Encounter (Signed)
Please refill as per office routine med refill policy (all routine meds refilled for 3 mo or monthly per pt preference up to one year from last visit, then month to month grace period for 3 mo, then further med refills will have to be denied)  

## 2020-05-17 NOTE — Telephone Encounter (Signed)
    Patient requesting medication be sent to [facility]

## 2020-05-19 ENCOUNTER — Telehealth: Payer: Self-pay | Admitting: Internal Medicine

## 2020-05-19 NOTE — Telephone Encounter (Signed)
LVM for pt to rtn my call to schedule AWV with [facility]. Please schedule this appt if pt calls the office.  °

## 2020-05-24 ENCOUNTER — Other Ambulatory Visit: Payer: Self-pay | Admitting: Surgical

## 2020-05-26 ENCOUNTER — Encounter: Payer: Medicare Other | Admitting: Gastroenterology

## 2020-05-30 ENCOUNTER — Ambulatory Visit (INDEPENDENT_AMBULATORY_CARE_PROVIDER_SITE_OTHER): Payer: Medicare Other

## 2020-05-30 ENCOUNTER — Other Ambulatory Visit: Payer: Self-pay

## 2020-05-30 ENCOUNTER — Encounter: Payer: Self-pay | Admitting: Internal Medicine

## 2020-05-30 ENCOUNTER — Ambulatory Visit: Payer: Medicare Other | Admitting: Internal Medicine

## 2020-05-30 VITALS — BP 150/90 | HR 74 | Temp 98.3°F | Ht 66.0 in | Wt 158.0 lb

## 2020-05-30 DIAGNOSIS — E538 Deficiency of other specified B group vitamins: Secondary | ICD-10-CM | POA: Diagnosis not present

## 2020-05-30 DIAGNOSIS — C61 Malignant neoplasm of prostate: Secondary | ICD-10-CM | POA: Diagnosis not present

## 2020-05-30 DIAGNOSIS — I10 Essential (primary) hypertension: Secondary | ICD-10-CM

## 2020-05-30 DIAGNOSIS — R61 Generalized hyperhidrosis: Secondary | ICD-10-CM

## 2020-05-30 DIAGNOSIS — E782 Mixed hyperlipidemia: Secondary | ICD-10-CM

## 2020-05-30 DIAGNOSIS — E1165 Type 2 diabetes mellitus with hyperglycemia: Secondary | ICD-10-CM | POA: Diagnosis not present

## 2020-05-30 DIAGNOSIS — R1310 Dysphagia, unspecified: Secondary | ICD-10-CM | POA: Diagnosis not present

## 2020-05-30 DIAGNOSIS — Z0000 Encounter for general adult medical examination without abnormal findings: Secondary | ICD-10-CM

## 2020-05-30 DIAGNOSIS — K921 Melena: Secondary | ICD-10-CM

## 2020-05-30 LAB — BASIC METABOLIC PANEL
BUN: 18 mg/dL (ref 6–23)
CO2: 30 mEq/L (ref 19–32)
Calcium: 10.6 mg/dL — ABNORMAL HIGH (ref 8.4–10.5)
Chloride: 100 mEq/L (ref 96–112)
Creatinine, Ser: 0.87 mg/dL (ref 0.40–1.50)
GFR: 82.73 mL/min (ref >60.00)
Glucose, Bld: 146 mg/dL — ABNORMAL HIGH (ref 70–99)
Potassium: 4.2 mEq/L (ref 3.5–5.1)
Sodium: 138 mEq/L (ref 135–145)

## 2020-05-30 LAB — CBC WITH DIFFERENTIAL/PLATELET
Basophils Absolute: 0 10*3/uL (ref 0.0–0.1)
Basophils Relative: 1.1 % (ref 0.0–3.0)
Eosinophils Absolute: 0.1 10*3/uL (ref 0.0–0.7)
Eosinophils Relative: 2.8 % (ref 0.0–5.0)
HCT: 39.5 % (ref 39.0–52.0)
Hemoglobin: 12.7 g/dL — ABNORMAL LOW (ref 13.0–17.0)
Lymphocytes Relative: 21.3 % (ref 12.0–46.0)
Lymphs Abs: 0.9 10*3/uL (ref 0.7–4.0)
MCHC: 32.2 g/dL (ref 30.0–36.0)
MCV: 79.5 fl (ref 78.0–100.0)
Monocytes Absolute: 0.5 10*3/uL (ref 0.1–1.0)
Monocytes Relative: 12.2 % — ABNORMAL HIGH (ref 3.0–12.0)
Neutro Abs: 2.7 10*3/uL (ref 1.4–7.7)
Neutrophils Relative %: 62.6 % (ref 43.0–77.0)
Platelets: 135 10*3/uL — ABNORMAL LOW (ref 150.0–400.0)
RBC: 4.97 Mil/uL (ref 4.22–5.81)
RDW: 15.2 % (ref 11.5–15.5)
WBC: 4.3 10*3/uL (ref 4.0–10.5)

## 2020-05-30 LAB — SEDIMENTATION RATE: Sed Rate: 15 mm/hr (ref 0–20)

## 2020-05-30 LAB — HEMOGLOBIN A1C: Hgb A1c MFr Bld: 6.8 % — ABNORMAL HIGH (ref 4.6–6.5)

## 2020-05-30 LAB — HEPATIC FUNCTION PANEL
ALT: 24 U/L (ref 0–53)
AST: 21 U/L (ref 0–37)
Albumin: 4.8 g/dL (ref 3.5–5.2)
Alkaline Phosphatase: 88 U/L (ref 39–117)
Bilirubin, Direct: 0.1 mg/dL (ref 0.0–0.3)
Total Bilirubin: 0.8 mg/dL (ref 0.2–1.2)
Total Protein: 7.2 g/dL (ref 6.0–8.3)

## 2020-05-30 LAB — URINALYSIS, ROUTINE W REFLEX MICROSCOPIC
Bilirubin Urine: NEGATIVE
Hgb urine dipstick: NEGATIVE
Leukocytes,Ua: NEGATIVE
Nitrite: NEGATIVE
RBC / HPF: NONE SEEN (ref 0–?)
Specific Gravity, Urine: >=1.03 — AB (ref 1.000–1.030)
Total Protein, Urine: NEGATIVE
Urine Glucose: NEGATIVE
Urobilinogen, UA: 0.2 (ref 0.0–1.0)
pH: 5.5 (ref 5.0–8.0)

## 2020-05-30 LAB — LIPID PANEL
Cholesterol: 169 mg/dL (ref 0–200)
HDL: 87.2 mg/dL (ref >39.00)
LDL Cholesterol: 45 mg/dL (ref 0–99)
NonHDL: 82.07
Total CHOL/HDL Ratio: 2
Triglycerides: 187 mg/dL — ABNORMAL HIGH (ref 0.0–149.0)
VLDL: 37.4 mg/dL (ref 0.0–40.0)

## 2020-05-30 LAB — PSA: PSA: 0 ng/mL — ABNORMAL LOW (ref 0.10–4.00)

## 2020-05-30 LAB — IBC + FERRITIN
Ferritin: 411.4 ng/mL — ABNORMAL HIGH (ref 22.0–322.0)
Iron: 122 ug/dL (ref 42–165)
Saturation Ratios: 31.6 % (ref 20.0–50.0)
Transferrin: 276 mg/dL (ref 212.0–360.0)

## 2020-05-30 LAB — TSH: TSH: 1.59 u[IU]/mL (ref 0.35–4.50)

## 2020-05-30 LAB — VITAMIN B12: Vitamin B-12: 753 pg/mL (ref 211–911)

## 2020-05-30 NOTE — Progress Notes (Signed)
Subjective:    Patient ID: Alex Massey, male    DOB: 20-May-1941, 79 y.o.   MRN: [id number]  HPI  Here to f/u; overall doing ok,  Pt denies chest pain, increasing sob or doe, wheezing, orthopnea, PND, increased LE swelling, palpitations, dizziness or syncope.  Pt denies new neurological symptoms such as new headache, or facial or extremity weakness or numbness.  Pt denies polydipsia, polyuria, or low sugar episode.  Pt states overall good compliance with meds, mostly trying to follow appropriate diet, with wt overall stable,  but little exercise however. Admits to recent higher salt diet, BP at home usually lower than160/90.  CBGs in ,mid 150s for the most part.   BP Readings from Last 3 Encounters:  05/30/20 (!) 150/90  04/05/20 128/80  03/16/20 (!) 158/82  Also with c/o Dysphagia x 1 mo to solids better with water but has been daily, but no vomiting, wt loss, or blood except for small hematochezia on tissue only after BM x 1.   Wt Readings from Last 3 Encounters:  05/30/20 158 lb (71.7 kg)  04/05/20 156 lb 2 oz (70.8 kg)  02/25/20 159 lb (72.1 kg)  Also with several years of mostly night Sweats, but occas during daytime as well, such as with sitting in church.  No smoker, last cxr mar 2020 neg.   Pt denies fever, wt loss, night sweats, loss of appetite, or other constitutional symptoms Past Medical History:  Diagnosis Date  . Acquired right foot drop    post lumbar surgery,  wears brace  . B12 deficiency   . Chronic constipation    diet  . ED (erectile dysfunction)   . History of kidney stones   . Hyperlipidemia   . Hyperplasia of prostate with lower urinary tract symptoms (LUTS)   . Hypertension   . Insomnia   . Neuropathy, peripheral    right foot  . Presence of surgical incision 01/21/2019   s/p  left carpal tunnel release --  pt has dressing on,  denies pain or numbness  . Prostate cancer [facility]) urologist-- dr eskridge/  oncologist-  dr Tammi Klippel   dx 08-04-2018,  Stage  T1c, Gleason 4+4  . Renal cyst, right   . Type 2 diabetes mellitus ([place])    followed by pcp  . Wears partial dentures    upper and lower   Past Surgical History:  Procedure Laterality Date  . CARPAL TUNNEL RELEASE Bilateral left 01-21-2019;  right 1980s  . CATARACT EXTRACTION Left 03/2020  . CYSTOSCOPY N/A 01/26/2019   Procedure: CYSTOSCOPY FLEXIBLE;  Surgeon: Festus Aloe, MD;  Location: [facility];  Service: Urology;  Laterality: N/A;  no seeds found in bladder  . LUMBAR DISC SURGERY  1984  L4-5;  1998 L4--S1  . LUMBAR EPIDURAL INJECTION  2009, 2006   dr newton  . LUMBAR MICRODISCECTOMY  05-13-2002  dr Jenean Lindau  _0    L4-5  . RADIOACTIVE SEED IMPLANT N/A 01/26/2019   Procedure: RADIOACTIVE SEED IMPLANT/BRACHYTHERAPY IMPLANT;  Surgeon: Festus Aloe, MD;  Location: [facility];  Service: Urology;  Laterality: N/A;    54 seeds implanted  . SPACE OAR INSTILLATION N/A 01/26/2019   Procedure: SPACE OAR INSTILLATION;  Surgeon: Festus Aloe, MD;  Location: [facility];  Service: Urology;  Laterality: N/A;    reports that he quit smoking about 17 years ago. His smoking use included cigarettes. He quit after 20.00 years of use. He has never used smokeless tobacco.  He reports current alcohol use of about 3.0 standard drinks of alcohol per week. He reports that he does not use drugs. family history includes Colon cancer (age of onset: 44) in his brother; Diabetes in his father; Healthy in his mother; Prostate cancer in his brother, cousin, and nephew. Allergies  Allergen Reactions  . Metformin And Related Other (See Comments)    GI upset, diarrhea, wt loss   Current Outpatient Medications on File Prior to Visit  Medication Sig Dispense Refill  . acarbose (PRECOSE) 50 MG tablet TAKE 1 TABLET BY MOUTH THREE TIMES DAILY WITH MEALS 270 tablet 1  . aspirin EC 81 MG tablet Take 1 tablet (81 mg total) by mouth daily. 150 tablet 2  . blood  glucose meter kit and supplies KIT Inject 1 each into the skin 2 (two) times daily. Dispense based on patient and insurance preference. Use up to four times daily as directed. (FOR ICD-9 250.00, 250.01). 1 each 0  . Blood Glucose Monitoring Suppl (FREESTYLE LITE) DEVI USE TO CHECK GLUCOSE UP TO 4 TIMES DAILY AS DIRECTED    . gabapentin (NEURONTIN) 300 MG capsule Take 300 mg by mouth 3 (three) times daily.    Marland Kitchen glipiZIDE (GLUCOTROL XL) 10 MG 24 hr tablet Take 1 tablet by mouth once daily with breakfast 90 tablet 0  . glucose blood (FREESTYLE TEST STRIPS) test strip Use to check blood sugars twice a day 180 each 1  . ID NOW COVID-19 KIT See admin instructions.    . Lancets (FREESTYLE) lancets Use to check blood sugars two times daily. Dx Code: E11.9 200 each 3  . losartan (COZAAR) 50 MG tablet TAKE 1 TABLET(50 MG) BY MOUTH DAILY 90 tablet 2  . meloxicam (MOBIC) 15 MG tablet TAKE 1 TABLET(15 MG) BY MOUTH DAILY 30 tablet 3  . oxybutynin (DITROPAN) 5 MG tablet Take 5 mg by mouth at bedtime.    Marland Kitchen PEG-KCl-NaCl-NaSulf-Na Asc-C (PLENVU) 140 g SOLR Take 140 g by mouth as directed. 1 each 0  . simvastatin (ZOCOR) 20 MG tablet TAKE 1 TABLET(20 MG) BY MOUTH AT BEDTIME 90 tablet 1  . sitaGLIPtin (JANUVIA) 100 MG tablet Take 1 tablet (100 mg total) by mouth daily. 90 tablet 3  . tamsulosin (FLOMAX) 0.4 MG CAPS capsule Take 0.4 mg by mouth daily.    . vitamin B-12 (CYANOCOBALAMIN) 1000 MCG tablet Take 1,000 mcg by mouth daily.     No current facility-administered medications on file prior to visit.   Review of Systems All otherwise neg per pt    Objective:   Physical Exam BP (!) 150/90 (BP Location: Left Arm, Patient Position: Sitting, Cuff Size: Large)   Pulse 74   Temp 98.3 F (36.8 C) (Oral)   Ht _0  (1.676 m)   Wt 158 lb (71.7 kg)   SpO2 98%   BMI 25.50 kg/m  VS noted,  Constitutional: Pt appears in NAD HENT: Head: NCAT.  Right Ear: External ear normal.  Left Ear: External ear normal.    Eyes: . Pupils are equal, round, and reactive to light. Conjunctivae and EOM are normal Nose: without d/c or deformity Neck: Neck supple. Gross normal ROM Cardiovascular: Normal rate and regular rhythm.   Pulmonary/Chest: Effort normal and breath sounds without rales or wheezing.  Abd:  Soft, NT, ND, + BS, no organomegaly Neurological: Pt is alert. At baseline orientation, motor grossly intact Skin: Skin is warm. No rashes, other new lesions, no LE edema Psychiatric: Pt behavior is normal  without agitation  All otherwise neg per pt  Lab Results  Component Value Date   WBC 3.0 (L) 11/25/2019   HGB 10.9 (L) 11/25/2019   HCT 33.5 (L) 11/25/2019   PLT 111.0 (L) 11/25/2019   GLUCOSE 185 (H) 12/01/2019   CHOL 149 12/01/2019   TRIG 79.0 12/01/2019   HDL 59.80 12/01/2019   LDLCALC 73 12/01/2019   ALT 11 12/01/2019   AST 13 12/01/2019   NA 139 12/01/2019   K 4.0 12/01/2019   CL 105 12/01/2019   CREATININE 0.85 12/01/2019   BUN 16 12/01/2019   CO2 27 12/01/2019   TSH 0.97 11/25/2019   PSA 0.00 (L) 11/25/2019   INR 1.0 01/22/2019   HGBA1C 7.3 (H) 12/01/2019   MICROALBUR <0.7 11/24/2018       Assessment & Plan:

## 2020-05-30 NOTE — Assessment & Plan Note (Signed)
Small volume x 1, last colonoscopy 2016, for cbc, f/u GI

## 2020-05-30 NOTE — Patient Instructions (Signed)
Please continue all other medications as before, and refills have been done if requested.  Please have the pharmacy call with any other refills you may need.  Please continue your efforts at being more active, low cholesterol diet, and weight control  Please keep your appointments with your specialists as you may have planned  You will be contacted regarding the referral for: Gastroenterology  Please go to the XRAY Department in the first floor for the x-ray testing  Please go to the LAB at the blood drawing area for the tests to be done  You will be contacted by phone if any changes need to be made immediately.  Otherwise, you will receive a letter about your results with an explanation, but please check with MyChart first.  Please remember to sign up for MyChart if you have not done so, as this will be important to you in the future with finding out test results, communicating by private email, and scheduling acute appointments online when needed.  Please make an Appointment to return in 6 months, or sooner if needed

## 2020-05-31 ENCOUNTER — Ambulatory Visit (INDEPENDENT_AMBULATORY_CARE_PROVIDER_SITE_OTHER): Payer: Medicare Other

## 2020-05-31 DIAGNOSIS — Z0000 Encounter for general adult medical examination without abnormal findings: Secondary | ICD-10-CM

## 2020-05-31 NOTE — Progress Notes (Signed)
I connected with Alex Massey today by telephone and verified that I am speaking with the correct person using two identifiers. Location patient: home Location provider: work Persons participating in the virtual visit: Alex Massey and Alex Abu, LPN.   I discussed the limitations, risks, security and privacy concerns of performing an evaluation and management service by telephone and the availability of in person appointments. I also discussed with the patient that there may be a patient responsible charge related to this service. The patient expressed understanding and verbally consented to this telephonic visit.    Interactive audio and video telecommunications were attempted between this provider and patient, however failed, due to patient having technical difficulties OR patient did not have access to video capability.  We continued and completed visit with audio only.  Some vital signs may be absent or patient reported.   Time Spent with patient on telephone encounter: 30 minutes  Subjective:   Alex Massey is a 79 y.o. male who presents for Medicare Annual/Subsequent preventive examination.  Review of Systems    No ROS. Medicare Wellness Visit. Additional risk factors are reflected in social history. Cardiac Risk Factors include: advanced age (>21mn, >>32women);diabetes mellitus;dyslipidemia;hypertension;male gender Sleep Patterns: No sleep issues, feels rested on waking and sleeps 8 hours nightly. Home Safety/Smoke Alarms: Feels safe in home; uses home alarm. Smoke alarms in place. Living environment: 1-story home; Lives with spouse; no needs for DME; good support system. Seat Belt Safety/Bike Helmet: Wears seat belt.    Objective:    There were no vitals filed for this visit. There is no height or weight on file to calculate BMI.  Advanced Directives 05/31/2020 01/26/2019 01/20/2015 01/06/2015  Does Patient Have a Medical Advance Directive? No Yes No No    Type of Advance Directive - Living will - -  Does patient want to make changes to medical advance directive? - Yes (MAU/Ambulatory/Procedural Areas - Information given) - -  Would patient like information on creating a medical advance directive? No - Patient declined - No - patient declined information No - patient declined information    Current Medications (verified) Outpatient Encounter Medications as of 05/31/2020  Medication Sig  . acarbose (PRECOSE) 50 MG tablet TAKE 1 TABLET BY MOUTH THREE TIMES DAILY WITH MEALS  . aspirin EC 81 MG tablet Take 1 tablet (81 mg total) by mouth daily.  . blood glucose meter kit and supplies KIT Inject 1 each into the skin 2 (two) times daily. Dispense based on patient and insurance preference. Use up to four times daily as directed. (FOR ICD-9 250.00, 250.01).  . Blood Glucose Monitoring Suppl (FREESTYLE LITE) DEVI USE TO CHECK GLUCOSE UP TO 4 TIMES DAILY AS DIRECTED  . gabapentin (NEURONTIN) 300 MG capsule Take 300 mg by mouth 3 (three) times daily.  .Marland KitchenglipiZIDE (GLUCOTROL XL) 10 MG 24 hr tablet Take 1 tablet by mouth once daily with breakfast  . glucose blood (FREESTYLE TEST STRIPS) test strip Use to check blood sugars twice a day  . ID NOW COVID-19 KIT See admin instructions.  . Lancets (FREESTYLE) lancets Use to check blood sugars two times daily. Dx Code: E11.9  . losartan (COZAAR) 50 MG tablet TAKE 1 TABLET(50 MG) BY MOUTH DAILY  . meloxicam (MOBIC) 15 MG tablet TAKE 1 TABLET(15 MG) BY MOUTH DAILY  . oxybutynin (DITROPAN) 5 MG tablet Take 5 mg by mouth at bedtime.  .Marland KitchenPEG-KCl-NaCl-NaSulf-Na Asc-C (PLENVU) 140 g SOLR Take 140 g by mouth as directed.  .Marland Kitchen  simvastatin (ZOCOR) 20 MG tablet TAKE 1 TABLET(20 MG) BY MOUTH AT BEDTIME  . sitaGLIPtin (JANUVIA) 100 MG tablet Take 1 tablet (100 mg total) by mouth daily.  . tamsulosin (FLOMAX) 0.4 MG CAPS capsule Take 0.4 mg by mouth daily.  . vitamin B-12 (CYANOCOBALAMIN) 1000 MCG tablet Take 1,000 mcg by mouth  daily.   No facility-administered encounter medications on file as of 05/31/2020.    Allergies (verified) Metformin and related   History: Past Medical History:  Diagnosis Date  . Acquired right foot drop    post lumbar surgery,  wears brace  . B12 deficiency   . Chronic constipation    diet  . ED (erectile dysfunction)   . History of kidney stones   . Hyperlipidemia   . Hyperplasia of prostate with lower urinary tract symptoms (LUTS)   . Hypertension   . Insomnia   . Neuropathy, peripheral    right foot  . Presence of surgical incision 01/21/2019   s/p  left carpal tunnel release --  pt has dressing on,  denies pain or numbness  . Prostate cancer [facility]) urologist-- dr eskridge/  oncologist-  dr Tammi Klippel   dx 08-04-2018,  Stage T1c, Gleason 4+4  . Renal cyst, right   . Type 2 diabetes mellitus ([place])    followed by pcp  . Wears partial dentures    upper and lower   Past Surgical History:  Procedure Laterality Date  . CARPAL TUNNEL RELEASE Bilateral left 01-21-2019;  right 1980s  . CATARACT EXTRACTION Left 03/2020  . CYSTOSCOPY N/A 01/26/2019   Procedure: CYSTOSCOPY FLEXIBLE;  Surgeon: Festus Aloe, MD;  Location: [facility];  Service: Urology;  Laterality: N/A;  no seeds found in bladder  . LUMBAR DISC SURGERY  1984  L4-5;  1998 L4--S1  . LUMBAR EPIDURAL INJECTION  2009, 2006   dr newton  . LUMBAR MICRODISCECTOMY  05-13-2002  dr Jenean Lindau  _0    L4-5  . RADIOACTIVE SEED IMPLANT N/A 01/26/2019   Procedure: RADIOACTIVE SEED IMPLANT/BRACHYTHERAPY IMPLANT;  Surgeon: Festus Aloe, MD;  Location: [facility];  Service: Urology;  Laterality: N/A;    54 seeds implanted  . SPACE OAR INSTILLATION N/A 01/26/2019   Procedure: SPACE OAR INSTILLATION;  Surgeon: Festus Aloe, MD;  Location: [facility];  Service: Urology;  Laterality: N/A;   Family History  Problem Relation Age of Onset  . Healthy Mother   . Prostate cancer  Brother   . Diabetes Father   . Colon cancer Brother 28  . Prostate cancer Cousin        maternal  . Prostate cancer Nephew    Social History   Socioeconomic History  . Marital status: Married    Spouse name: Not on file  . Number of children: 2  . Years of education: 68  . Highest education level: Not on file  Occupational History    Comment: retired  Tobacco Use  . Smoking status: Former Smoker    Years: 20.00    Types: Cigarettes    Quit date: 01/25/2003    Years since quitting: 17.3  . Smokeless tobacco: Never Used  Vaping Use  . Vaping Use: Never used  Substance and Sexual Activity  . Alcohol use: Yes    Alcohol/week: 3.0 standard drinks    Types: 3 Glasses of wine per week  . Drug use: Never  . Sexual activity: Not on file  Other Topics Concern  . Not on file  Social  History Narrative   [employer] 2 years trade school. married 1965. 2 daughters- '66, '71. 2 grandchldren. 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   Social Determinants of Health   Financial Resource Strain: Low Risk   . Difficulty of Paying Living Expenses: Not hard at all  Food Insecurity: No Food Insecurity  . Worried About Charity fundraiser in the Last Year: Never true  . Ran Out of Food in the Last Year: Never true  Transportation Needs: No Transportation Needs  . Lack of Transportation (Medical): No  . Lack of Transportation (Non-Medical): No  Physical Activity: Sufficiently Active  . Days of Exercise per Week: 5 days  . Minutes of Exercise per Session: 30 min  Stress: No Stress Concern Present  . Feeling of Stress : Not at all  Social Connections: Socially Integrated  . Frequency of Communication with Friends and Family: More than three times a week  . Frequency of Social Gatherings with Friends and Family: More than three times a week  . Attends Religious Services: More than 4 times per year  . Active Member of Clubs or Organizations: Yes  . Attends Theatre manager Meetings: More than 4 times per year  . Marital Status: Married    Tobacco Counseling Counseling given: Not Answered   Clinical Intake:  Pre-visit preparation completed: Yes  Pain : No/denies pain     Nutritional Risks: None Diabetes: No  How often do you need to have someone help you when you read instructions, pamphlets, or other written materials from your doctor or pharmacy?: 1 - Never What is the last grade level you completed in school?: [employer], [place] for 2 years  Diabetic? yes  Interpreter Needed?: No  Information entered by :: Alex Abu, LPN   Activities of Daily Living In your present state of health, do you have any difficulty performing the following activities: 05/31/2020 05/30/2020  Hearing? N N  Vision? N N  Difficulty concentrating or making decisions? N N  Walking or climbing stairs? N N  Dressing or bathing? N N  Doing errands, shopping? N N  Preparing Food and eating ? N -  Using the Toilet? N -  In the past six months, have you accidently leaked urine? N -  Do you have problems with loss of bowel control? N -  Managing your Medications? N -  Managing your Finances? N -  Housekeeping or managing your Housekeeping? N -  Some recent data might be hidden    Patient Care Team: Biagio Borg, MD as PCP - General (Internal Medicine) Newt Minion, MD (Orthopedic Surgery) Lafayette Dragon, MD (Inactive) (Gastroenterology) Marygrace Drought, MD (Ophthalmology) Magnus Sinning, MD (Physical Medicine and Rehabilitation)  Indicate any recent Medical Services you may have received from other than [facility] providers in the past year (date may be approximate).     Assessment:   This is a routine wellness examination for [facility].  Hearing/Vision screen No exam data present  Dietary issues and exercise activities discussed: Current Exercise Habits: Home exercise routine, Type of exercise: walking;Other - see comments (yard work),  Time (Minutes): 30, Frequency (Times/Week): 5, Weekly Exercise (Minutes/Week): 150, Intensity: Moderate, Exercise limited by: None identified  Goals   None    Depression Screen PHQ 2/9 Scores 05/31/2020 11/25/2019 11/25/2019 11/24/2018 02/06/2017 02/05/2016 02/01/2015  PHQ - 2 Score 0 0 0 0 0 0 0    Fall Risk Fall Risk  05/31/2020 11/25/2019 11/25/2019 11/24/2018 02/06/2017  Falls in the past year? 0 0 0 0 No  Number falls in past yr: 0 - 0 - -  Injury with Fall? 0 - 0 - -  Risk for fall due to : No Fall Risks - No Fall Risks - -  Follow up Falls evaluation completed - Falls evaluation completed - -    Any stairs in or around the home? No  If so, are there any without handrails? No  Home free of loose throw rugs in walkways, pet beds, electrical cords, etc? Yes  Adequate lighting in your home to reduce risk of falls? Yes   ASSISTIVE DEVICES UTILIZED TO PREVENT FALLS:  Life alert? No  Use of a cane, walker or w/c? No  Grab bars in the bathroom? Yes  Shower chair or bench in shower? No  Elevated toilet seat or a handicapped toilet? No   TIMED UP AND GO:  Was the test performed? No .  Length of time to ambulate 10 feet: 0 sec.   Gait steady and fast without use of assistive device  Cognitive Function: Patient is cogitatively intact.        Immunizations Immunization History  Administered Date(s) Administered  . Fluad Quad(high Dose 65+) 04/06/2020  . Influenza Split 04/09/2011, 04/13/2012  . Influenza Whole 04/04/2008, 04/19/2009, 04/12/2010  . Influenza, High Dose Seasonal PF 05/13/2013, 03/14/2016, 03/28/2017, 03/16/2018  . Influenza,inj,Quad PF,6+ Mos 04/06/2014  . Influenza-Unspecified 05/08/2019  . Moderna SARS-COVID-2 Vaccination 09/03/2019, 11/10/2019  . Pneumococcal Conjugate-13 09/12/2014  . Pneumococcal Polysaccharide-23 01/02/2010  . Td 01/02/2010  . Zoster 01/07/2012    TDAP status: Due, Education has been provided regarding the importance of this vaccine.  Advised may receive this vaccine at local pharmacy or Health Dept. Aware to provide a copy of the vaccination record if obtained from local pharmacy or Health Dept. Verbalized acceptance and understanding. Flu Vaccine status: Up to date Pneumococcal vaccine status: Up to date Covid-19 vaccine status: Completed vaccines  Qualifies for Shingles Vaccine? Yes   Zostavax completed Yes   Shingrix Completed?: No.    Education has been provided regarding the importance of this vaccine. Patient has been advised to call insurance company to determine out of pocket expense if they have not yet received this vaccine. Advised may also receive vaccine at local pharmacy or Health Dept. Verbalized acceptance and understanding.  Screening Tests Health Maintenance  Topic Date Due  . Hepatitis C Screening  Never done  . TETANUS/TDAP  01/03/2020  . COLONOSCOPY  01/20/2020  . OPHTHALMOLOGY EXAM  03/09/2020  . FOOT EXAM  11/24/2020  . HEMOGLOBIN A1C  11/27/2020  . INFLUENZA VACCINE  Completed  . COVID-19 Vaccine  Completed  . PNA vac Low Risk Adult  Completed    Health Maintenance  Health Maintenance Due  Topic Date Due  . Hepatitis C Screening  Never done  . TETANUS/TDAP  01/03/2020  . COLONOSCOPY  01/20/2020  . OPHTHALMOLOGY EXAM  03/09/2020    Colorectal cancer screening: Completed 01/20/2015. Repeat every 5 years  Lung Cancer Screening: (Low Dose CT Chest recommended if Age 77-80 years, 30 pack-year currently smoking OR have quit w/in 15years.) does not qualify.   Lung Cancer Screening Referral: no  Additional Screening:  Hepatitis C Screening: does qualify; Completed no  Vision Screening: Recommended annual ophthalmology exams for early detection of glaucoma and other disorders of the eye. Is the patient up to date with their annual eye exam?  Yes  Who is the provider or what is  the name of the office in which the patient attends annual eye exams? Marygrace Drought, MD. If pt is not  established with a provider, would they like to be referred to a provider to establish care? No .   Dental Screening: Recommended annual dental exams for proper oral hygiene  Community Resource Referral / Chronic Care Management: CRR required this visit?  No   CCM required this visit?  No      Plan:     I have personally reviewed and noted the following in the patient's chart:   . Medical and social history . Use of alcohol, tobacco or illicit drugs  . Current medications and supplements . Functional ability and status . Nutritional status . Physical activity . Advanced directives . List of other physicians . Hospitalizations, surgeries, and ER visits in previous 12 months . Vitals . Screenings to include cognitive, depression, and falls . Referrals and appointments  In addition, I have reviewed and discussed with patient certain preventive protocols, quality metrics, and best practice recommendations. A written personalized care plan for preventive services as well as general preventive health recommendations were provided to patient.     Sheral Flow, LPN   04/[id number]   Nurse Notes:  Patient is cogitatively intact. There were no vitals filed for this visit. There is no height or weight on file to calculate BMI. Patient stated that he has no issues with gait or balance; does not use any assistive devices.

## 2020-05-31 NOTE — Patient Instructions (Signed)
Alex Massey , Thank you for taking time to come for your Medicare Wellness Visit. I appreciate your ongoing commitment to your health goals. Please review the following plan we discussed and let me know if I can assist you in the future.   Screening recommendations/referrals: Colonoscopy: 01/20/2015; due every 5 years; scheduled for 08/2020 due to 1st degree relative with colon cancer Recommended yearly ophthalmology/optometry visit for glaucoma screening and checkup Recommended yearly dental visit for hygiene and checkup  Vaccinations: Influenza vaccine: 04/06/2020 Pneumococcal vaccine: up to date Tdap vaccine: 01/02/2010; due every 10 years Shingles vaccine: never done   Covid-19: up to date with booster  Advanced directives: Advance directive discussed with you today. Even though you declined this today please call our office should you change your mind and we can give you the proper paperwork for you to fill out.  Conditions/risks identified: Yes; Reviewed health maintenance screenings with patient today and relevant education, vaccines, and/or referrals were provided. Please continue to do your personal lifestyle choices by: daily care of teeth and gums, regular physical activity (goal should be 5 days a week for 30 minutes), eat a healthy diet, avoid tobacco and drug use, limiting any alcohol intake, taking a low-dose aspirin (if not allergic or have been advised by your provider otherwise) and taking vitamins and minerals as recommended by your provider. Continue doing brain stimulating activities (puzzles, reading, adult coloring books, staying active) to keep memory sharp. Continue to eat heart healthy diet (full of fruits, vegetables, whole grains, lean protein, water--limit salt, fat, and sugar intake) and increase physical activity as tolerated.  Next appointment: Please schedule your next Medicare Wellness Visit with your Nurse Health Advisor in 1 year by calling  [phone].  Preventive Care 78 Years and Older, Male Preventive care refers to lifestyle choices and visits with your health care provider that can promote health and wellness. What does preventive care include?  A yearly physical exam. This is also called an annual well check.  Dental exams once or twice a year.  Routine eye exams. Ask your health care provider how often you should have your eyes checked.  Personal lifestyle choices, including:  Daily care of your teeth and gums.  Regular physical activity.  Eating a healthy diet.  Avoiding tobacco and drug use.  Limiting alcohol use.  Practicing safe sex.  Taking low doses of aspirin every day.  Taking vitamin and mineral supplements as recommended by your health care provider. What happens during an annual well check? The services and screenings done by your health care provider during your annual well check will depend on your age, overall health, lifestyle risk factors, and family history of disease. Counseling  Your health care provider may ask you questions about your:  Alcohol use.  Tobacco use.  Drug use.  Emotional well-being.  Home and relationship well-being.  Sexual activity.  Eating habits.  History of falls.  Memory and ability to understand (cognition).  Work and work Statistician. Screening  You may have the following tests or measurements:  Height, weight, and BMI.  Blood pressure.  Lipid and cholesterol levels. These may be checked every 5 years, or more frequently if you are over 19 years old.  Skin check.  Lung cancer screening. You may have this screening every year starting at age 17 if you have a 30-pack-year history of smoking and currently smoke or have quit within the past 15 years.  Fecal occult blood test (FOBT) of the stool. You may have  this test every year starting at age 44.  Flexible sigmoidoscopy or colonoscopy. You may have a sigmoidoscopy every 5 years or a  colonoscopy every 10 years starting at age 11.  Prostate cancer screening. Recommendations will vary depending on your family history and other risks.  Hepatitis C blood test.  Hepatitis B blood test.  Sexually transmitted disease (STD) testing.  Diabetes screening. This is done by checking your blood sugar (glucose) after you have not eaten for a while (fasting). You may have this done every 1-3 years.  Abdominal aortic aneurysm (AAA) screening. You may need this if you are a current or former smoker.  Osteoporosis. You may be screened starting at age 62 if you are at high risk. Talk with your health care provider about your test results, treatment options, and if necessary, the need for more tests. Vaccines  Your health care provider may recommend certain vaccines, such as:  Influenza vaccine. This is recommended every year.  Tetanus, diphtheria, and acellular pertussis (Tdap, Td) vaccine. You may need a Td booster every 10 years.  Zoster vaccine. You may need this after age 36.  Pneumococcal 13-valent conjugate (PCV13) vaccine. One dose is recommended after age 11.  Pneumococcal polysaccharide (PPSV23) vaccine. One dose is recommended after age 67. Talk to your health care provider about which screenings and vaccines you need and how often you need them. This information is not intended to replace advice given to you by your health care provider. Make sure you discuss any questions you have with your health care provider. Document Released: 07/21/2015 Document Revised: 03/13/2016 Document Reviewed: 04/25/2015 Elsevier Interactive Patient Education  2017 [place] Prevention in the Home Falls can cause injuries. They can happen to people of all ages. There are many things you can do to make your home safe and to help prevent falls. What can I do on the outside of my home?  Regularly fix the edges of walkways and driveways and fix any cracks.  Remove anything that  might make you trip as you walk through a door, such as a raised step or threshold.  Trim any bushes or trees on the path to your home.  Use bright outdoor lighting.  Clear any walking paths of anything that might make someone trip, such as rocks or tools.  Regularly check to see if handrails are loose or broken. Make sure that both sides of any steps have handrails.  Any raised decks and porches should have guardrails on the edges.  Have any leaves, snow, or ice cleared regularly.  Use sand or salt on walking paths during winter.  Clean up any spills in your garage right away. This includes oil or grease spills. What can I do in the bathroom?  Use night lights.  Install grab bars by the toilet and in the tub and shower. Do not use towel bars as grab bars.  Use non-skid mats or decals in the tub or shower.  If you need to sit down in the shower, use a plastic, non-slip stool.  Keep the floor dry. Clean up any water that spills on the floor as soon as it happens.  Remove soap buildup in the tub or shower regularly.  Attach bath mats securely with double-sided non-slip rug tape.  Do not have throw rugs and other things on the floor that can make you trip. What can I do in the bedroom?  Use night lights.  Make sure that you have a light by  your bed that is easy to reach.  Do not use any sheets or blankets that are too big for your bed. They should not hang down onto the floor.  Have a firm chair that has side arms. You can use this for support while you get dressed.  Do not have throw rugs and other things on the floor that can make you trip. What can I do in the kitchen?  Clean up any spills right away.  Avoid walking on wet floors.  Keep items that you use a lot in easy-to-reach places.  If you need to reach something above you, use a strong step stool that has a grab bar.  Keep electrical cords out of the way.  Do not use floor polish or wax that makes floors  slippery. If you must use wax, use non-skid floor wax.  Do not have throw rugs and other things on the floor that can make you trip. What can I do with my stairs?  Do not leave any items on the stairs.  Make sure that there are handrails on both sides of the stairs and use them. Fix handrails that are broken or loose. Make sure that handrails are as long as the stairways.  Check any carpeting to make sure that it is firmly attached to the stairs. Fix any carpet that is loose or worn.  Avoid having throw rugs at the top or bottom of the stairs. If you do have throw rugs, attach them to the floor with carpet tape.  Make sure that you have a light switch at the top of the stairs and the bottom of the stairs. If you do not have them, ask someone to add them for you. What else can I do to help prevent falls?  Wear shoes that:  Do not have high heels.  Have rubber bottoms.  Are comfortable and fit you well.  Are closed at the toe. Do not wear sandals.  If you use a stepladder:  Make sure that it is fully opened. Do not climb a closed stepladder.  Make sure that both sides of the stepladder are locked into place.  Ask someone to hold it for you, if possible.  Clearly mark and make sure that you can see:  Any grab bars or handrails.  First and last steps.  Where the edge of each step is.  Use tools that help you move around (mobility aids) if they are needed. These include:  Canes.  Walkers.  Scooters.  Crutches.  Turn on the lights when you go into a dark area. Replace any light bulbs as soon as they burn out.  Set up your furniture so you have a clear path. Avoid moving your furniture around.  If any of your floors are uneven, fix them.  If there are any pets around you, be aware of where they are.  Review your medicines with your doctor. Some medicines can make you feel dizzy. This can increase your chance of falling. Ask your doctor what other things that you  can do to help prevent falls. This information is not intended to replace advice given to you by your health care provider. Make sure you discuss any questions you have with your health care provider. Document Released: 04/20/2009 Document Revised: 11/30/2015 Document Reviewed: 07/29/2014 Elsevier Interactive Patient Education  2017 [employer].

## 2020-06-02 LAB — PROTEIN ELECTROPHORESIS, SERUM
Abnormal Protein Band1: 0.5 g/dL — ABNORMAL HIGH
Albumin ELP: 4.6 g/dL (ref 3.8–4.8)
Alpha 1: 0.3 g/dL (ref 0.2–0.3)
Alpha 2: 0.7 g/dL (ref 0.5–0.9)
Beta 2: 0.3 g/dL (ref 0.2–0.5)
Beta Globulin: 0.4 g/dL (ref 0.4–0.6)
Gamma Globulin: 0.9 g/dL (ref 0.8–1.7)
Total Protein: 7.2 g/dL (ref 6.1–8.1)

## 2020-06-04 ENCOUNTER — Encounter: Payer: Self-pay | Admitting: Internal Medicine

## 2020-06-04 DIAGNOSIS — R1310 Dysphagia, unspecified: Secondary | ICD-10-CM | POA: Insufficient documentation

## 2020-06-04 NOTE — Assessment & Plan Note (Signed)
stable overall by history and exam, recent data reviewed with pt, and pt to continue medical treatment as before,  to f/u any worsening symptoms or concerns  

## 2020-06-04 NOTE — Assessment & Plan Note (Signed)
Also for cxr,  to f/u any worsening symptoms or concerns 

## 2020-06-04 NOTE — Assessment & Plan Note (Signed)
Also for psa, f/u urology

## 2020-06-04 NOTE — Assessment & Plan Note (Signed)
Etiology unclear, for refer GI, may need egd  I spent 31 minutes in preparing to see the patient by review of recent labs, imaging and procedures, obtaining and reviewing separately obtained history, communicating with the patient and family or caregiver, ordering medications, tests or procedures, and documenting clinical information in the EHR including the differential Dx, treatment, and any further evaluation and other management of dysphagia, dm, htn, night sweats

## 2020-06-08 ENCOUNTER — Other Ambulatory Visit: Payer: Self-pay | Admitting: Internal Medicine

## 2020-06-09 ENCOUNTER — Other Ambulatory Visit: Payer: Self-pay

## 2020-06-09 MED ORDER — FREESTYLE LANCETS MISC
0 refills | Status: DC
Start: 2020-06-09 — End: 2022-01-29

## 2020-06-12 DIAGNOSIS — E119 Type 2 diabetes mellitus without complications: Secondary | ICD-10-CM | POA: Diagnosis not present

## 2020-06-20 ENCOUNTER — Encounter: Payer: Self-pay | Admitting: Internal Medicine

## 2020-06-26 IMAGING — NM NM BONE WHOLE BODY
2 series · 2 of 2 positions shown · non-contrast
Comparison: None.

CLINICAL DATA: History of prostate cancer

EXAM:
NUCLEAR MEDICINE WHOLE BODY BONE SCAN
TECHNIQUE: Whole body anterior and posterior images were obtained approximately
3 hours after intravenous injection of radiopharmaceutical.
RADIOPHARMACEUTICALS:  21.6 mCi Dechnetium-TTm MDP IV

[Series 1: whole body · 2.66mm/px · 1 of 1 slices shown (1 of 2)]
[im 1/1]
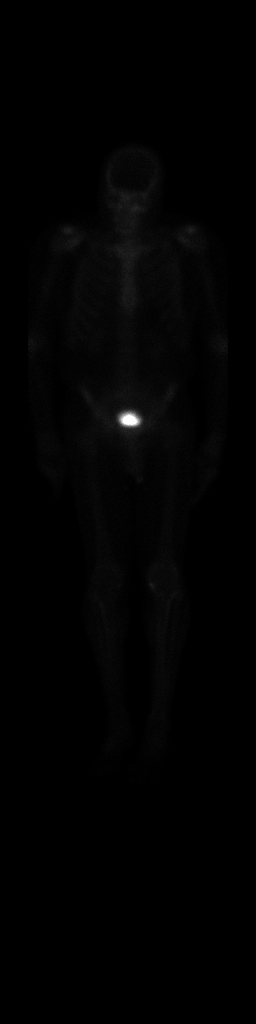

[Series 1: whole body · 2.66mm/px · 1 of 1 slices shown (2 of 2)]
[im 1/1]
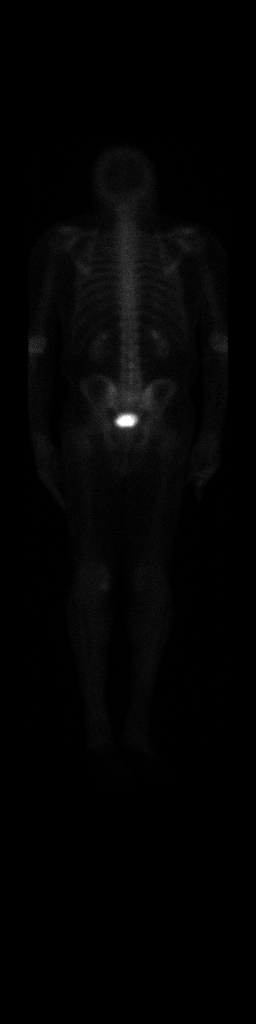

[2 of 2 positions shown; findings below may reference images not displayed]

FINDINGS: Abnormal uptake is noted medially involving the left knee most
likely representing degenerative change. No other areas of abnormal
uptake are noted.
IMPRESSION: No definite scintigraphic evidence of osseous metastases.

## 2020-07-31 ENCOUNTER — Other Ambulatory Visit: Payer: Self-pay | Admitting: Internal Medicine

## 2020-07-31 NOTE — Telephone Encounter (Signed)
Please refill as per office routine med refill policy (all routine meds refilled for 3 mo or monthly per pt preference up to one year from last visit, then month to month grace period for 3 mo, then further med refills will have to be denied)  

## 2020-08-09 IMAGING — DX CHEST - 2 VIEW
2 series · 2 of 2 positions shown · non-contrast
Comparison: CT chest 09/04/2007

CLINICAL DATA: Prostate cancer, former smoker, type II diabetes
mellitus, hypertension

EXAM:
CHEST - 2 VIEW

[chest pa]
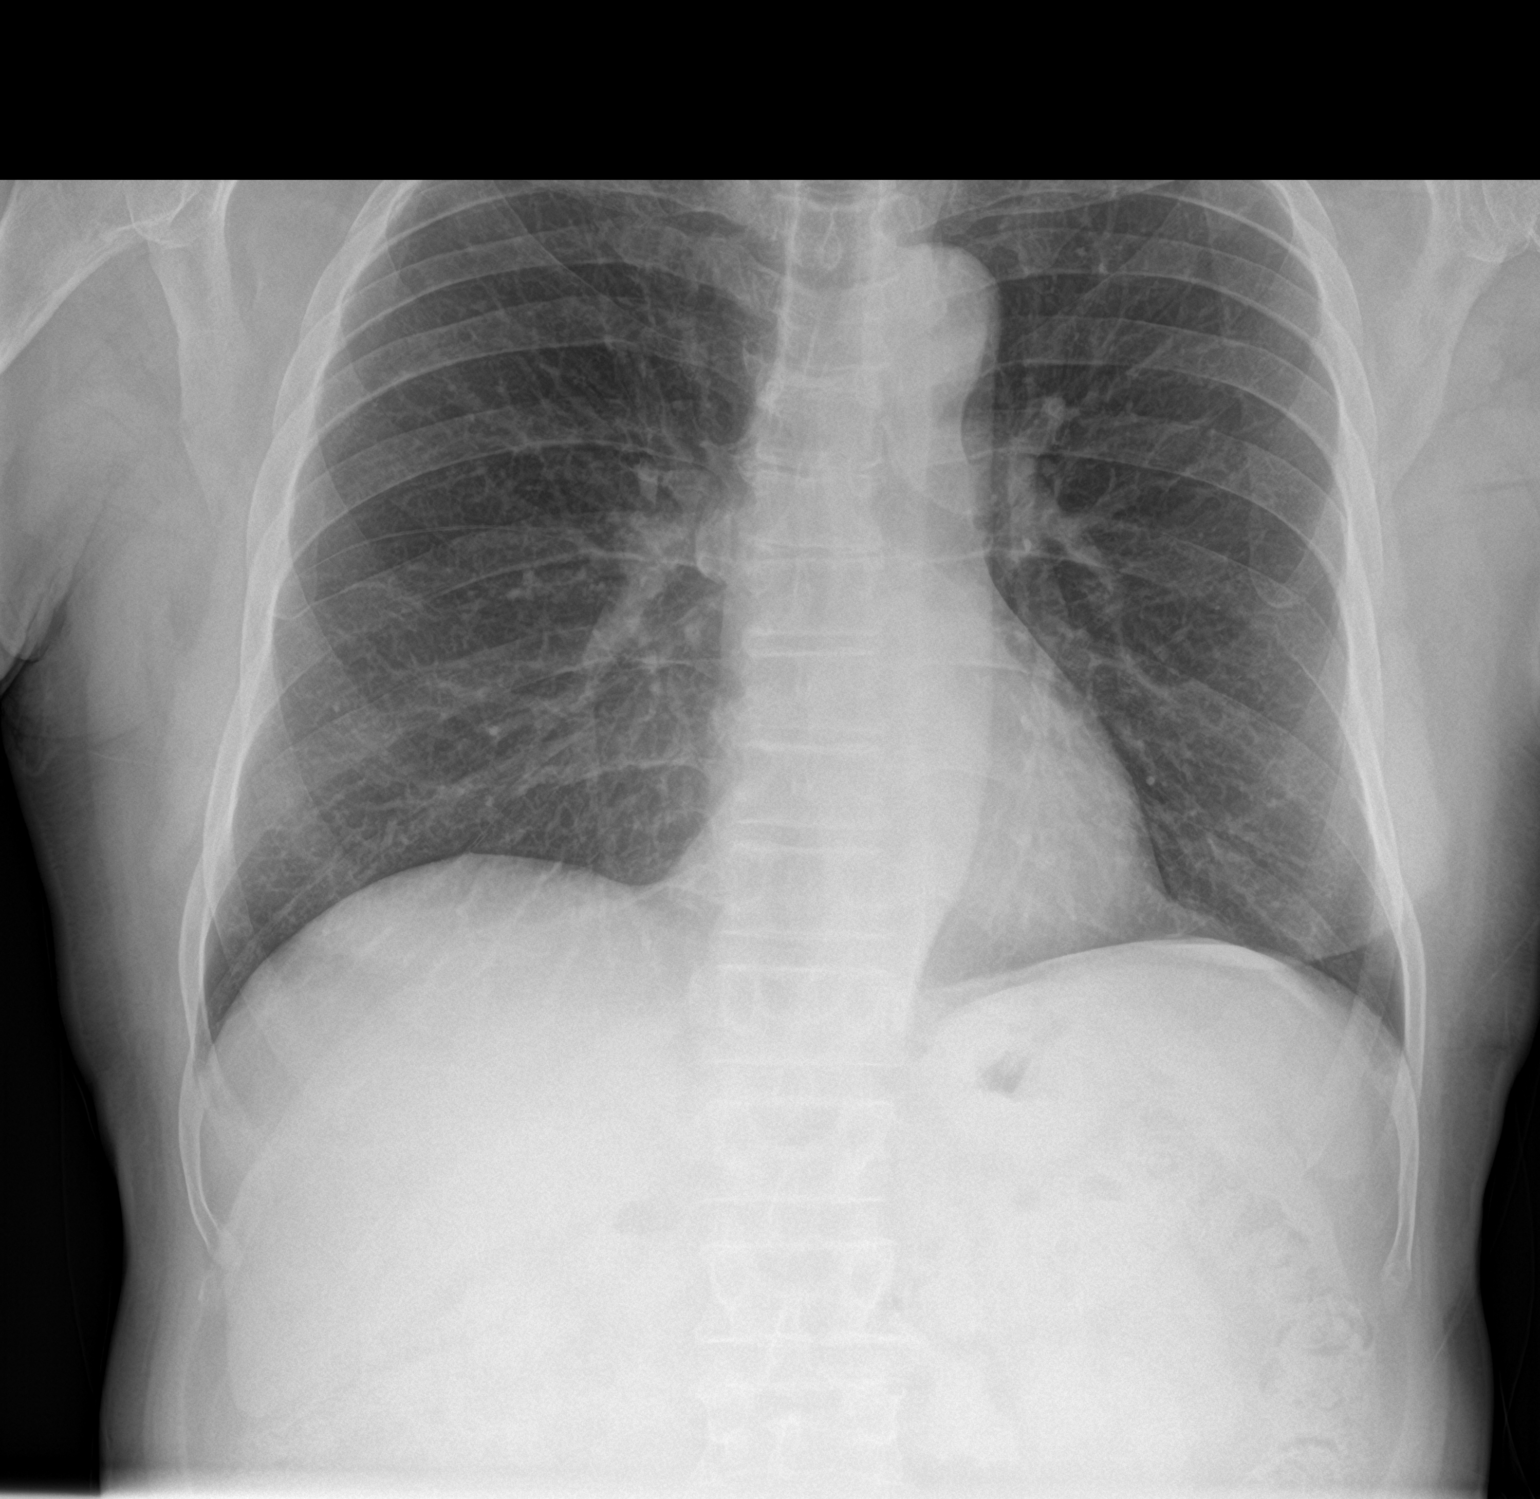

[chest lat]
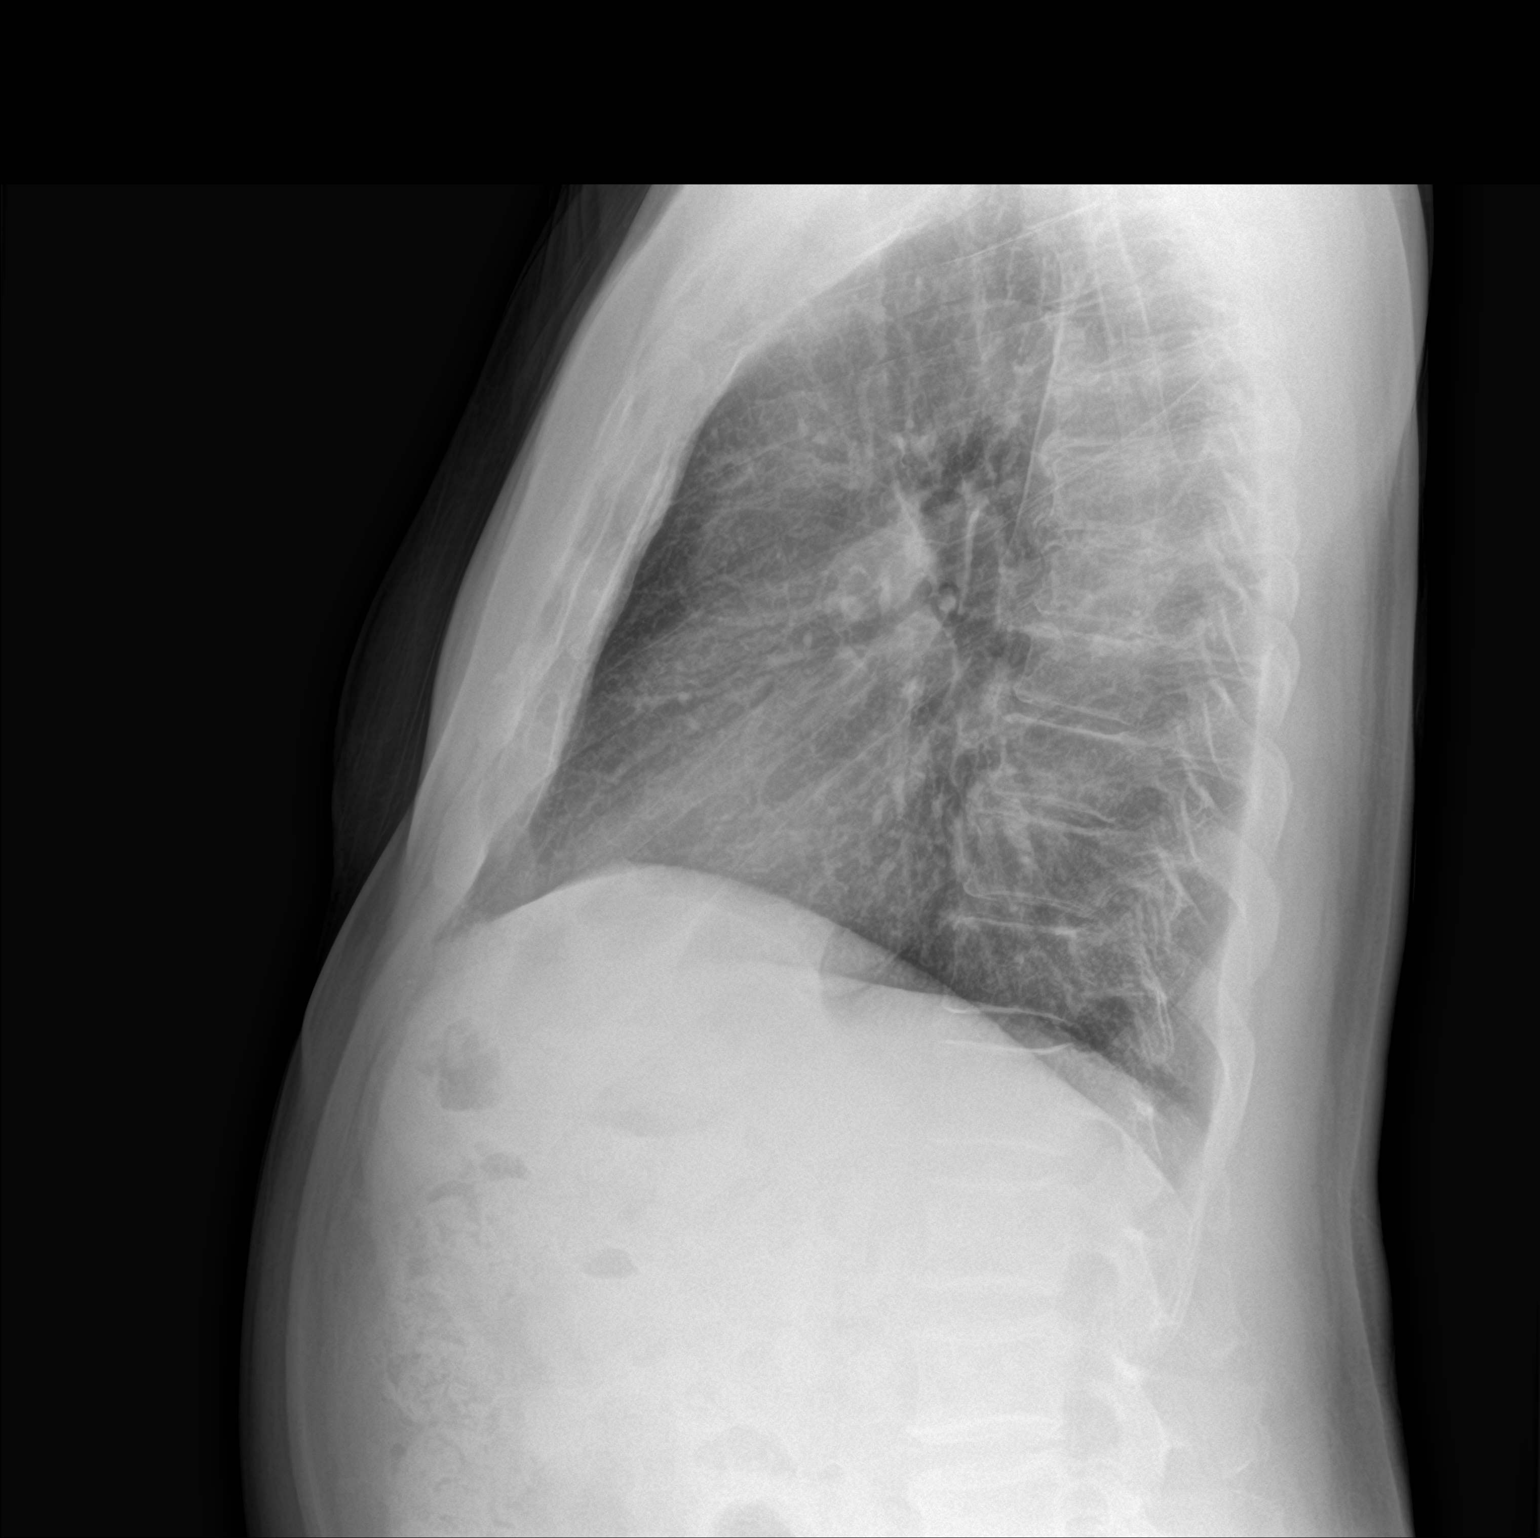

[2 of 2 positions shown; findings below may reference images not displayed]

FINDINGS: Normal heart size, mediastinal contours, and pulmonary vascularity.

Lungs clear.

No infiltrate, pleural effusion or pneumothorax.

No pulmonary nodules or acute osseous findings.
IMPRESSION: Normal exam.

## 2020-08-15 ENCOUNTER — Telehealth (INDEPENDENT_AMBULATORY_CARE_PROVIDER_SITE_OTHER): Payer: Medicare Other | Admitting: Internal Medicine

## 2020-08-15 DIAGNOSIS — R0981 Nasal congestion: Secondary | ICD-10-CM | POA: Diagnosis not present

## 2020-08-15 MED ORDER — PREDNISONE 10 MG PO TABS: ORAL_TABLET | ORAL | 0 refills | Status: DC

## 2020-08-15 NOTE — Progress Notes (Signed)
Patient ID: Alex Massey, male   DOB: [birth year], 80 y.o.   MRN: [id number]  Cumulative time during 7-day interval 16 min, there was not an associated office visit for this concern within a 7 day period.  Verbal consent for services obtained from patient prior to services given.  Names of all persons present for services: Cathlean Cower, MD, pateint  Chief complaint: nasal congestion  History, background, results pertinent:  Here with persistent nasal congestion for 5 days, worse at night it seems and unalbe to sleep well, feels like he cant breathe; denies fever, pain, blood and no sick contacts.  Last covid neg testing 3 mo ago.  Not better with zicam or alka seltzer.     Past Medical History:  Diagnosis Date  . Acquired right foot drop    post lumbar surgery,  wears brace  . B12 deficiency   . Chronic constipation    diet  . ED (erectile dysfunction)   . History of kidney stones   . Hyperlipidemia   . Hyperplasia of prostate with lower urinary tract symptoms (LUTS)   . Hypertension   . Insomnia   . Neuropathy, peripheral    right foot  . Presence of surgical incision 01/21/2019   s/p  left carpal tunnel release --  pt has dressing on,  denies pain or numbness  . Prostate cancer [facility]) urologist-- dr eskridge/  oncologist-  dr Tammi Klippel   dx 08-04-2018,  Stage T1c, Gleason 4+4  . Renal cyst, right   . Type 2 diabetes mellitus ([place])    followed by pcp  . Wears partial dentures    upper and lower   No results found for this or any previous visit (from the past 48 hour(s)).     A/P/next steps:   1)  Nasal cogestiong - etiology unclear, cant r/o covid, b ut suspect possible allergy flare - for covid testing, but also trial predpac asd, allegra and nasacort asd,  to f/u any worsening symptoms or concerns   Cathlean Cower MD

## 2020-08-24 ENCOUNTER — Encounter: Payer: Self-pay | Admitting: Internal Medicine

## 2020-08-24 DIAGNOSIS — R0981 Nasal congestion: Secondary | ICD-10-CM | POA: Insufficient documentation

## 2020-08-24 NOTE — Assessment & Plan Note (Signed)
See notes

## 2020-08-24 NOTE — Patient Instructions (Signed)
See notes

## 2020-09-01 DIAGNOSIS — Z8546 Personal history of malignant neoplasm of prostate: Secondary | ICD-10-CM | POA: Diagnosis not present

## 2020-09-08 DIAGNOSIS — R351 Nocturia: Secondary | ICD-10-CM | POA: Diagnosis not present

## 2020-09-08 DIAGNOSIS — N401 Enlarged prostate with lower urinary tract symptoms: Secondary | ICD-10-CM | POA: Diagnosis not present

## 2020-09-08 DIAGNOSIS — Z8546 Personal history of malignant neoplasm of prostate: Secondary | ICD-10-CM | POA: Diagnosis not present

## 2020-09-18 ENCOUNTER — Other Ambulatory Visit: Payer: Self-pay | Admitting: Internal Medicine

## 2020-10-10 ENCOUNTER — Encounter: Payer: Self-pay | Admitting: Orthopaedic Surgery

## 2020-10-10 ENCOUNTER — Telehealth: Payer: Self-pay

## 2020-10-10 ENCOUNTER — Ambulatory Visit: Payer: Medicare Other | Admitting: Orthopaedic Surgery

## 2020-10-10 ENCOUNTER — Other Ambulatory Visit: Payer: Self-pay

## 2020-10-10 DIAGNOSIS — M79642 Pain in left hand: Secondary | ICD-10-CM

## 2020-10-10 DIAGNOSIS — M79641 Pain in right hand: Secondary | ICD-10-CM | POA: Diagnosis not present

## 2020-10-10 MED ORDER — MELOXICAM 15 MG PO TABS: ORAL_TABLET | ORAL | 3 refills | Status: AC

## 2020-10-10 NOTE — Telephone Encounter (Signed)
Santiago Glad with [facility] would like referral and demographics faxed to [phone].  CB# [phone].

## 2020-10-10 NOTE — Progress Notes (Signed)
Office Visit Note   Patient: Alex Massey           Date of Birth: Aug 09, 1940           MRN: [id number] Visit Date: 10/10/2020              Requested by: Biagio Borg, MD [address] [place],  [place] [address] PCP: Biagio Borg, MD   Assessment & Plan: Visit Diagnoses:  1. Bilateral hand pain     Plan: I feel that he would benefit from outpatient hand therapy by a certified hand therapist to work on strengthening his hands and decreasing his pain.  I have recommended topical Voltaren gel as well as oral meloxicam.  I gave him prescription for hand therapy.  All question concerns were answered and addressed.  He agrees with this treatment plan.  We can see him back in 6 weeks just to make sure he is doing well.  Follow-Up Instructions: Return in about 6 weeks (around 11/21/2020).   Orders:  No orders of the defined types were placed in this encounter.  Meds ordered this encounter  Medications  . meloxicam (MOBIC) 15 MG tablet    Sig: TAKE 1 TABLET(15 MG) BY MOUTH DAILY    Dispense:  30 tablet    Refill:  3      Procedures: No procedures performed   Clinical Data: No additional findings.   Subjective: Chief Complaint  Patient presents with  . Left Hand - Pain  The patient comes in for evaluation treatment of bilateral hand pain and some swelling.  He is 80 years old.  He is a diabetic.  He has a remote history of a left open carpal tunnel release done by another Psychologist, sport and exercise.  He says his right hand swells at times but it is his left hand and he points to the ring finger that bothers him in terms of not being able to make a full fist.  He says his fingertips are numb on both hands.  He would really like to get stronger with his pinch and grip strength.  Sometimes it is a constant ache for him.  HPI  Review of Systems He currently denies any headache, chest pain, shortness of breath, fever, chills, nausea, vomiting  Objective: Vital Signs: There were no vitals  taken for this visit.  Physical Exam Is alert and orient x3 and in no acute distress Ortho Exam Examination of both hands shows no muscle atrophy.  He does have problems making a full composite fist due to stiffness of his left ring finger.  There is no triggering.  He has a well-healed surgical incision in the palm of his left hand.  All fingers are well perfused and he has palpable pulses in his wrist.  There is no swelling of the joints today.  He does have weak pinch and grip strength bilaterally. Specialty Comments:  No specialty comments available.  Imaging: No results found.   PMFS History: Patient Active Problem List   Diagnosis Date Noted  . Congestion of nasal sinus 08/24/2020  . Dysphagia 06/04/2020  . Night sweats 05/30/2020  . Hematochezia 05/30/2020  . Right foot drop 02/02/2020  . Paresthesia of skin 02/02/2020  . Status post carpal tunnel release 02/04/2019  . Left carpal tunnel syndrome 11/29/2018  . Malignant neoplasm of prostate ([place]) 09/03/2018  . Vitamin B12 deficiency 02/13/2018  . Lumbar radiculopathy 07/09/2016  . Radiculopathy due to lumbar intervertebral disc disorder 07/09/2016  .  Hemorrhoid 08/07/2015  . BPH without obstruction/lower urinary tract symptoms 02/01/2015  . Essential hypertension 09/12/2014  . Routine general medical examination at a health care facility 01/14/2011  . ERECTILE DYSFUNCTION 04/04/2008  . Hyperlipidemia 06/26/2007  . Diabetes ([place]) 06/25/2007  . NEPHROLITHIASIS, HX OF 06/25/2007   Past Medical History:  Diagnosis Date  . Acquired right foot drop    post lumbar surgery,  wears brace  . B12 deficiency   . Chronic constipation    diet  . ED (erectile dysfunction)   . History of kidney stones   . Hyperlipidemia   . Hyperplasia of prostate with lower urinary tract symptoms (LUTS)   . Hypertension   . Insomnia   . Neuropathy, peripheral    right foot  . Presence of surgical incision 01/21/2019   s/p  left carpal  tunnel release --  pt has dressing on,  denies pain or numbness  . Prostate cancer [facility]) urologist-- dr eskridge/  oncologist-  dr Tammi Klippel   dx 08-04-2018,  Stage T1c, Gleason 4+4  . Renal cyst, right   . Type 2 diabetes mellitus ([place])    followed by pcp  . Wears partial dentures    upper and lower    Family History  Problem Relation Age of Onset  . Healthy Mother   . Prostate cancer Brother   . Diabetes Father   . Colon cancer Brother 54  . Prostate cancer Cousin        maternal  . Prostate cancer Nephew     Past Surgical History:  Procedure Laterality Date  . CARPAL TUNNEL RELEASE Bilateral left 01-21-2019;  right 1980s  . CATARACT EXTRACTION Left 03/2020  . CYSTOSCOPY N/A 01/26/2019   Procedure: CYSTOSCOPY FLEXIBLE;  Surgeon: Festus Aloe, MD;  Location: [facility];  Service: Urology;  Laterality: N/A;  no seeds found in bladder  . LUMBAR DISC SURGERY  1984  L4-5;  1998 L4--S1  . LUMBAR EPIDURAL INJECTION  2009, 2006   dr newton  . LUMBAR MICRODISCECTOMY  05-13-2002  dr Jenean Lindau  @[facility]    L4-5  . RADIOACTIVE SEED IMPLANT N/A 01/26/2019   Procedure: RADIOACTIVE SEED IMPLANT/BRACHYTHERAPY IMPLANT;  Surgeon: Festus Aloe, MD;  Location[facility];  Service: Urology;  Laterality: N/A;    54 seeds implanted  . SPACE OAR INSTILLATION N/A 01/26/2019   Procedure: SPACE OAR INSTILLATION;  Surgeon: Festus Aloe, MD;  Location: [facility];  Service: Urology;  Laterality: N/A;   Social History   Occupational History    Comment: retired  Tobacco Use  . Smoking status: Former Smoker    Years: 20.00    Types: Cigarettes    Quit date: 01/25/2003    Years since quitting: 17.7  . Smokeless tobacco: Never Used  Vaping Use  . Vaping Use: Never used  Substance and Sexual Activity  . Alcohol use: Yes    Alcohol/week: 3.0 standard drinks    Types: 3 Glasses of wine per week  . Drug use: Never  . Sexual activity: Not on file

## 2020-10-10 NOTE — Addendum Note (Signed)
Addended by: Robyne Peers on: 10/10/2020 10:47 AM   Modules accepted: Orders

## 2020-10-10 NOTE — Telephone Encounter (Signed)
Order placed in chart.

## 2020-10-10 NOTE — Telephone Encounter (Signed)
Order faxed to [facility] as requested

## 2020-10-16 DIAGNOSIS — M25541 Pain in joints of right hand: Secondary | ICD-10-CM | POA: Diagnosis not present

## 2020-10-16 DIAGNOSIS — M25642 Stiffness of left hand, not elsewhere classified: Secondary | ICD-10-CM | POA: Diagnosis not present

## 2020-10-16 DIAGNOSIS — M25542 Pain in joints of left hand: Secondary | ICD-10-CM | POA: Diagnosis not present

## 2020-10-16 DIAGNOSIS — M25641 Stiffness of right hand, not elsewhere classified: Secondary | ICD-10-CM | POA: Diagnosis not present

## 2020-10-20 DIAGNOSIS — M25541 Pain in joints of right hand: Secondary | ICD-10-CM | POA: Diagnosis not present

## 2020-10-20 DIAGNOSIS — M25642 Stiffness of left hand, not elsewhere classified: Secondary | ICD-10-CM | POA: Diagnosis not present

## 2020-10-20 DIAGNOSIS — M25641 Stiffness of right hand, not elsewhere classified: Secondary | ICD-10-CM | POA: Diagnosis not present

## 2020-10-20 DIAGNOSIS — M25542 Pain in joints of left hand: Secondary | ICD-10-CM | POA: Diagnosis not present

## 2020-10-23 DIAGNOSIS — M25641 Stiffness of right hand, not elsewhere classified: Secondary | ICD-10-CM | POA: Diagnosis not present

## 2020-10-23 DIAGNOSIS — M25541 Pain in joints of right hand: Secondary | ICD-10-CM | POA: Diagnosis not present

## 2020-10-23 DIAGNOSIS — M25542 Pain in joints of left hand: Secondary | ICD-10-CM | POA: Diagnosis not present

## 2020-10-23 DIAGNOSIS — M25642 Stiffness of left hand, not elsewhere classified: Secondary | ICD-10-CM | POA: Diagnosis not present

## 2020-10-27 DIAGNOSIS — M25642 Stiffness of left hand, not elsewhere classified: Secondary | ICD-10-CM | POA: Diagnosis not present

## 2020-10-27 DIAGNOSIS — M25541 Pain in joints of right hand: Secondary | ICD-10-CM | POA: Diagnosis not present

## 2020-10-27 DIAGNOSIS — M25641 Stiffness of right hand, not elsewhere classified: Secondary | ICD-10-CM | POA: Diagnosis not present

## 2020-10-27 DIAGNOSIS — M25542 Pain in joints of left hand: Secondary | ICD-10-CM | POA: Diagnosis not present

## 2020-10-30 DIAGNOSIS — M25641 Stiffness of right hand, not elsewhere classified: Secondary | ICD-10-CM | POA: Diagnosis not present

## 2020-10-30 DIAGNOSIS — M25541 Pain in joints of right hand: Secondary | ICD-10-CM | POA: Diagnosis not present

## 2020-10-30 DIAGNOSIS — M25542 Pain in joints of left hand: Secondary | ICD-10-CM | POA: Diagnosis not present

## 2020-10-30 DIAGNOSIS — M25642 Stiffness of left hand, not elsewhere classified: Secondary | ICD-10-CM | POA: Diagnosis not present

## 2020-11-03 ENCOUNTER — Telehealth: Payer: Self-pay | Admitting: Orthopaedic Surgery

## 2020-11-03 DIAGNOSIS — M25541 Pain in joints of right hand: Secondary | ICD-10-CM | POA: Diagnosis not present

## 2020-11-03 DIAGNOSIS — M25542 Pain in joints of left hand: Secondary | ICD-10-CM | POA: Diagnosis not present

## 2020-11-03 DIAGNOSIS — M25641 Stiffness of right hand, not elsewhere classified: Secondary | ICD-10-CM | POA: Diagnosis not present

## 2020-11-03 DIAGNOSIS — M25642 Stiffness of left hand, not elsewhere classified: Secondary | ICD-10-CM | POA: Diagnosis not present

## 2020-11-03 NOTE — Telephone Encounter (Signed)
Ok! If he wants to call and schedule for a trigger finger injection that is fine

## 2020-11-03 NOTE — Telephone Encounter (Signed)
Received call from [facility] with [facility] stating patient was referred for Bil Hand. Estill Bamberg said patient has a trigger finger and would benefit from a cortisone injection. Estill Bamberg wanted Dr. Ninfa Linden to know. The number to contact Estill Bamberg is [phone]

## 2020-11-06 DIAGNOSIS — M25642 Stiffness of left hand, not elsewhere classified: Secondary | ICD-10-CM | POA: Diagnosis not present

## 2020-11-06 DIAGNOSIS — M25541 Pain in joints of right hand: Secondary | ICD-10-CM | POA: Diagnosis not present

## 2020-11-06 DIAGNOSIS — M25542 Pain in joints of left hand: Secondary | ICD-10-CM | POA: Diagnosis not present

## 2020-11-06 DIAGNOSIS — M25641 Stiffness of right hand, not elsewhere classified: Secondary | ICD-10-CM | POA: Diagnosis not present

## 2020-11-08 ENCOUNTER — Other Ambulatory Visit: Payer: Self-pay | Admitting: Internal Medicine

## 2020-11-08 NOTE — Telephone Encounter (Signed)
Please refill as per office routine med refill policy (all routine meds refilled for 3 mo or monthly per pt preference up to one year from last visit, then month to month grace period for 3 mo, then further med refills will have to be denied)  

## 2020-11-14 ENCOUNTER — Ambulatory Visit: Payer: Medicare Other | Admitting: Orthopaedic Surgery

## 2020-11-14 ENCOUNTER — Encounter: Payer: Self-pay | Admitting: Orthopaedic Surgery

## 2020-11-14 DIAGNOSIS — M79641 Pain in right hand: Secondary | ICD-10-CM | POA: Diagnosis not present

## 2020-11-14 DIAGNOSIS — M79642 Pain in left hand: Secondary | ICD-10-CM

## 2020-11-14 NOTE — Progress Notes (Signed)
The patient is a pleasant 80 year old gentleman that I am seeing in follow-up after sending him to a certified hand therapist to work on any modalities that can decrease his hand pain and improve his strength and function of his hands.  He has remote history of carpal tunnel surgery on the left side done over a decade ago.  Nerve conduction studies in 2019 still showed some moderate carpal tunnel syndrome on the left side.  He denies numbness and tingling in either hand.  He just reports pain with gripping and pinching activities.  He has not tried Voltaren gel.  I talked her about trying this for his hands and getting this over-the-counter and can rub this on areas that hurt on his joints at the MCP joints 2-3 times daily.  There is no muscle atrophy in either hand.  He has decent grip and pinch strength and no numbness and tingling.  At this point follow-up can be as needed.  We will try Voltaren gel.  All questions and concerns were answered and addressed.

## 2020-11-21 ENCOUNTER — Ambulatory Visit: Payer: Medicare Other | Admitting: Orthopaedic Surgery

## 2020-11-27 ENCOUNTER — Other Ambulatory Visit: Payer: Self-pay

## 2020-11-27 ENCOUNTER — Encounter: Payer: Self-pay | Admitting: Internal Medicine

## 2020-11-27 ENCOUNTER — Ambulatory Visit (INDEPENDENT_AMBULATORY_CARE_PROVIDER_SITE_OTHER): Payer: Medicare Other | Admitting: Internal Medicine

## 2020-11-27 VITALS — BP 152/74 | HR 65 | Temp 97.8°F | Ht 66.0 in | Wt 157.0 lb

## 2020-11-27 DIAGNOSIS — E1165 Type 2 diabetes mellitus with hyperglycemia: Secondary | ICD-10-CM | POA: Diagnosis not present

## 2020-11-27 DIAGNOSIS — Z1159 Encounter for screening for other viral diseases: Secondary | ICD-10-CM

## 2020-11-27 DIAGNOSIS — Z23 Encounter for immunization: Secondary | ICD-10-CM

## 2020-11-27 DIAGNOSIS — Z86010 Personal history of colonic polyps: Secondary | ICD-10-CM | POA: Diagnosis not present

## 2020-11-27 DIAGNOSIS — I10 Essential (primary) hypertension: Secondary | ICD-10-CM | POA: Diagnosis not present

## 2020-11-27 DIAGNOSIS — Z0001 Encounter for general adult medical examination with abnormal findings: Secondary | ICD-10-CM

## 2020-11-27 DIAGNOSIS — E782 Mixed hyperlipidemia: Secondary | ICD-10-CM | POA: Diagnosis not present

## 2020-11-27 DIAGNOSIS — E559 Vitamin D deficiency, unspecified: Secondary | ICD-10-CM

## 2020-11-27 DIAGNOSIS — E538 Deficiency of other specified B group vitamins: Secondary | ICD-10-CM | POA: Diagnosis not present

## 2020-11-27 LAB — CBC WITH DIFFERENTIAL/PLATELET
Basophils Absolute: 0 10*3/uL (ref 0.0–0.1)
Basophils Relative: 1.2 % (ref 0.0–3.0)
Eosinophils Absolute: 0.1 10*3/uL (ref 0.0–0.7)
Eosinophils Relative: 4.1 % (ref 0.0–5.0)
HCT: 36 % — ABNORMAL LOW (ref 39.0–52.0)
Hemoglobin: 11.7 g/dL — ABNORMAL LOW (ref 13.0–17.0)
Lymphocytes Relative: 26.1 % (ref 12.0–46.0)
Lymphs Abs: 1 10*3/uL (ref 0.7–4.0)
MCHC: 32.4 g/dL (ref 30.0–36.0)
MCV: 79.7 fl (ref 78.0–100.0)
Monocytes Absolute: 0.4 10*3/uL (ref 0.1–1.0)
Monocytes Relative: 12 % (ref 3.0–12.0)
Neutro Abs: 2.1 10*3/uL (ref 1.4–7.7)
Neutrophils Relative %: 56.6 % (ref 43.0–77.0)
Platelets: 123 10*3/uL — ABNORMAL LOW (ref 150.0–400.0)
RBC: 4.52 Mil/uL (ref 4.22–5.81)
RDW: 14.9 % (ref 11.5–15.5)
WBC: 3.6 10*3/uL — ABNORMAL LOW (ref 4.0–10.5)

## 2020-11-27 LAB — LIPID PANEL
Cholesterol: 158 mg/dL (ref 0–200)
HDL: 80.1 mg/dL (ref >39.00)
LDL Cholesterol: 52 mg/dL (ref 0–99)
NonHDL: 77.86
Total CHOL/HDL Ratio: 2
Triglycerides: 127 mg/dL (ref 0.0–149.0)
VLDL: 25.4 mg/dL (ref 0.0–40.0)

## 2020-11-27 LAB — BASIC METABOLIC PANEL
BUN: 15 mg/dL (ref 6–23)
CO2: 29 mEq/L (ref 19–32)
Calcium: 10 mg/dL (ref 8.4–10.5)
Chloride: 102 mEq/L (ref 96–112)
Creatinine, Ser: 0.86 mg/dL (ref 0.40–1.50)
GFR: 82.73 mL/min (ref >60.00)
Glucose, Bld: 224 mg/dL — ABNORMAL HIGH (ref 70–99)
Potassium: 4.6 mEq/L (ref 3.5–5.1)
Sodium: 140 mEq/L (ref 135–145)

## 2020-11-27 LAB — URINALYSIS, ROUTINE W REFLEX MICROSCOPIC
Bilirubin Urine: NEGATIVE
Hgb urine dipstick: NEGATIVE
Ketones, ur: NEGATIVE
Leukocytes,Ua: NEGATIVE
Nitrite: NEGATIVE
Specific Gravity, Urine: >=1.03 — AB (ref 1.000–1.030)
Total Protein, Urine: NEGATIVE
Urine Glucose: 100 — AB
Urobilinogen, UA: 0.2 (ref 0.0–1.0)
WBC, UA: NONE SEEN (ref 0–?)
pH: 5 (ref 5.0–8.0)

## 2020-11-27 LAB — HEMOGLOBIN A1C: Hgb A1c MFr Bld: 7.6 % — ABNORMAL HIGH (ref 4.6–6.5)

## 2020-11-27 LAB — HEPATIC FUNCTION PANEL
ALT: 13 U/L (ref 0–53)
AST: 13 U/L (ref 0–37)
Albumin: 4.5 g/dL (ref 3.5–5.2)
Alkaline Phosphatase: 97 U/L (ref 39–117)
Bilirubin, Direct: 0.2 mg/dL (ref 0.0–0.3)
Total Bilirubin: 0.8 mg/dL (ref 0.2–1.2)
Total Protein: 6.7 g/dL (ref 6.0–8.3)

## 2020-11-27 LAB — MICROALBUMIN / CREATININE URINE RATIO
Creatinine,U: 161.5 mg/dL
Microalb Creat Ratio: 0.9 mg/g (ref 0.0–30.0)
Microalb, Ur: 1.5 mg/dL (ref 0.0–1.9)

## 2020-11-27 LAB — VITAMIN D 25 HYDROXY (VIT D DEFICIENCY, FRACTURES): VITD: 23.06 ng/mL — ABNORMAL LOW (ref 30.00–100.00)

## 2020-11-27 LAB — PSA: PSA: 0.01 ng/mL — ABNORMAL LOW (ref 0.10–4.00)

## 2020-11-27 LAB — VITAMIN B12: Vitamin B-12: 591 pg/mL (ref 211–911)

## 2020-11-27 LAB — TSH: TSH: 1.48 u[IU]/mL (ref 0.35–4.50)

## 2020-11-27 NOTE — Patient Instructions (Signed)
You had the Tdap tetanus shot today  Please continue all other medications as before, and refills have been done if requested.  Please have the pharmacy call with any other refills you may need.  Please continue your efforts at being more active, low cholesterol diet, and weight control.  You are otherwise up to date with prevention measures today.  Please keep your appointments with your specialists as you may have planned  You will be contacted regarding the referral for: colonoscopy  Please go to the LAB at the blood drawing area for the tests to be done  You will be contacted by phone if any changes need to be made immediately.  Otherwise, you will receive a letter about your results with an explanation, but please check with MyChart first.  Please remember to sign up for MyChart if you have not done so, as this will be important to you in the future with finding out test results, communicating by private email, and scheduling acute appointments online when needed.  Please make an Appointment to return in 6 months, or sooner if needed

## 2020-11-27 NOTE — Progress Notes (Signed)
Patient ID: Alex Massey, male   DOB: [birth year], 80 y.o.   MRN: [id number]         Chief Complaint:: wellness exam and Follow-up (6 month)  elevated BP, dm, hx colon polyp, low b12       HPI:  Alex Massey is a 80 y.o. male here for wellness exam; due for Tdap, hep c screen, and colonoscopy, o/w up to date with preventive referrals, and immunizations                        Also not taking b12.  Pt denies chest pain, increased sob or doe, wheezing, orthopnea, PND, increased LE swelling, palpitations, dizziness or syncope.   Pt denies polydipsia, polyuria, or new focal neuro s/s.  CBGs have been [phone] at home.  Has hx of colon polyp, due for colonoscopy.  BP have been < 140/90 at home.   Pt denies fever, wt loss, night sweats, loss of appetite, or other constitutional symptoms  No other new complaints  No taking vit d.  Wt Readings from Last 3 Encounters:  11/27/20 157 lb (71.2 kg)  05/30/20 158 lb (71.7 kg)  04/05/20 156 lb 2 oz (70.8 kg)   BP Readings from Last 3 Encounters:  11/27/20 (!) 152/74  05/30/20 (!) 150/90  04/05/20 128/80   Immunization History  Administered Date(s) Administered  . Fluad Quad(high Dose 65+) 04/06/2020  . Influenza Split 04/09/2011, 04/13/2012  . Influenza Whole 04/04/2008, 04/19/2009, 04/12/2010  . Influenza, High Dose Seasonal PF 05/13/2013, 03/14/2016, 03/28/2017, 03/16/2018  . Influenza,inj,Quad PF,6+ Mos 04/06/2014  . Influenza-Unspecified 05/08/2019  . Moderna Sars-Covid-2 Vaccination 09/03/2019, 11/10/2019, 04/18/2020  . Pneumococcal Conjugate-13 09/12/2014  . Pneumococcal Polysaccharide-23 01/02/2010  . Td 01/02/2010  . Tdap 11/27/2020  . Zoster, Live 01/07/2012   Health Maintenance Due  Topic Date Due  . Zoster Vaccines- Shingrix (1 of 2) Never done  . COLONOSCOPY (Pts 45-20yr Insurance coverage will need to be confirmed)  01/20/2020      Past Medical History:  Diagnosis Date  . Acquired right foot drop    post lumbar  surgery,  wears brace  . B12 deficiency   . Chronic constipation    diet  . ED (erectile dysfunction)   . History of kidney stones   . Hyperlipidemia   . Hyperplasia of prostate with lower urinary tract symptoms (LUTS)   . Hypertension   . Insomnia   . Neuropathy, peripheral    right foot  . Presence of surgical incision 01/21/2019   s/p  left carpal tunnel release --  pt has dressing on,  denies pain or numbness  . Prostate cancer ([facility] urologist-- dr eskridge/  oncologist-  dr mTammi Klippel  dx 08-04-2018,  Stage T1c, Gleason 4+4  . Renal cyst, right   . [place]  . Wears partial dentures    upper and lower   Past Surgical History:  Procedure Laterality Date  . CARPAL TUNNEL RELEASE Bilateral left 01-21-2019;  right 1980s  . CATARACT EXTRACTION Left 03/2020  . CYSTOSCOPY N/A 01/26/2019   Procedure: CYSTOSCOPY FLEXIBLE;  Surgeon: EFestus Aloe MD;  [facility]  Service: Urology;  Laterality: N/A;  no seeds found in bladder  . LUMBAR DISC SURGERY  1984  L4-5;  1998 L4--S1  . LUMBAR EPIDURAL INJECTION  2009, 2006   dr newton  . LUMBAR MICRODISCECTOMY  05-13-2002  dr kJenean Lindau @  [facility]   L4-5  . RADIOACTIVE SEED IMPLANT N/A 01/26/2019   Procedure: RADIOACTIVE SEED IMPLANT/BRACHYTHERAPY IMPLANT;  Surgeon: Festus Aloe, MD;  Location: [facility];  Service: Urology;  Laterality: N/A;    54 seeds implanted  . SPACE OAR INSTILLATION N/A 01/26/2019   Procedure: SPACE OAR INSTILLATION;  Surgeon: Festus Aloe, MD;  Location: [facility];  Service: Urology;  Laterality: N/A;    reports that he quit smoking about 17 years ago. His smoking use included cigarettes. He quit after 20.00 years of use. He has never used smokeless tobacco. He reports current alcohol use of about 3.0 standard drinks of alcohol per week. He reports that he does not use drugs. family history includes Colon cancer (age of  onset: 71) in his brother; Diabetes in his father; Healthy in his mother; Prostate cancer in his brother, cousin, and nephew. Allergies  Allergen Reactions  . Metformin And Related Other (See Comments)    GI upset, diarrhea, wt loss   Current Outpatient Medications on File Prior to Visit  Medication Sig Dispense Refill  . acarbose (PRECOSE) 50 MG tablet TAKE 1 TABLET BY MOUTH THREE TIMES DAILY WITH MEALS 270 tablet 1  . aspirin EC 81 MG tablet Take 1 tablet (81 mg total) by mouth daily. 150 tablet 2  . blood glucose meter kit and supplies KIT Inject 1 each into the skin 2 (two) times daily. Dispense based on patient and insurance preference. Use up to four times daily as directed. (FOR ICD-9 250.00, 250.01). 1 each 0  . Blood Glucose Monitoring Suppl (FREESTYLE LITE) DEVI USE TO CHECK GLUCOSE UP TO 4 TIMES DAILY AS DIRECTED    . gabapentin (NEURONTIN) 300 MG capsule Take 300 mg by mouth 3 (three) times daily.    Marland Kitchen glipiZIDE (GLUCOTROL XL) 10 MG 24 hr tablet TAKE 1 TABLET(10 MG) BY MOUTH DAILY WITH BREAKFAST 30 tablet 0  . glucose blood (FREESTYLE TEST STRIPS) test strip Use to check blood sugars twice a day 180 each 1  . ID NOW COVID-19 KIT See admin instructions.    . Lancets (FREESTYLE) lancets USE TO CHECK BLOOD SUGARS TWICE DAILY. DX: E11.9 200 each 0  . losartan (COZAAR) 50 MG tablet TAKE 1 TABLET(50 MG) BY MOUTH DAILY 90 tablet 2  . meloxicam (MOBIC) 15 MG tablet TAKE 1 TABLET(15 MG) BY MOUTH DAILY 30 tablet 3  . oxybutynin (DITROPAN) 5 MG tablet Take 5 mg by mouth at bedtime.    Marland Kitchen PEG-KCl-NaCl-NaSulf-Na Asc-C (PLENVU) 140 g SOLR Take 140 g by mouth as directed. 1 each 0  . predniSONE (DELTASONE) 10 MG tablet 3 tabs by mouth per day for 3 days,2tabs per day for 3 days,1tab per day for 3 days 18 tablet 0  . simvastatin (ZOCOR) 20 MG tablet TAKE 1 TABLET(20 MG) BY MOUTH AT BEDTIME Annual appt due in May must see provider for future refills 90 tablet 0  . sitaGLIPtin (JANUVIA) 100 MG tablet  Take 1 tablet (100 mg total) by mouth daily. 90 tablet 3  . tamsulosin (FLOMAX) 0.4 MG CAPS capsule Take 0.4 mg by mouth daily.    . vitamin B-12 (CYANOCOBALAMIN) 1000 MCG tablet Take 1,000 mcg by mouth daily.     No current facility-administered medications on file prior to visit.        ROS:  All others reviewed and negative.  Objective        PE:  BP (!) 152/74 (BP Location: Right Arm, Patient Position:  Sitting, Cuff Size: Normal)   Pulse 65   Temp 97.8 F (36.6 C) (Oral)   Ht 5' 6" (1.676 m)   Wt 157 lb (71.2 kg)   SpO2 98%   BMI 25.34 kg/m                 Constitutional: Pt appears in NAD               HENT: Head: NCAT.                Right Ear: External ear normal.                 Left Ear: External ear normal.                Eyes: . Pupils are equal, round, and reactive to light. Conjunctivae and EOM are normal               Nose: without d/c or deformity               Neck: Neck supple. Gross normal ROM               Cardiovascular: Normal rate and regular rhythm.                 Pulmonary/Chest: Effort normal and breath sounds without rales or wheezing.                Abd:  Soft, NT, ND, + BS, no organomegaly               Neurological: Pt is alert. At baseline orientation, motor grossly intact               Skin: Skin is warm. No rashes, no other new lesions, LE edema - none               Psychiatric: Pt behavior is normal without agitation   Micro: none  Cardiac tracings I have personally interpreted today:  none  Pertinent Radiological findings (summarize): none   Lab Results  Component Value Date   WBC 3.6 (L) 11/27/2020   HGB 11.7 (L) 11/27/2020   HCT 36.0 (L) 11/27/2020   PLT 123.0 (L) 11/27/2020   GLUCOSE 224 (H) 11/27/2020   CHOL 158 11/27/2020   TRIG 127.0 11/27/2020   HDL 80.10 11/27/2020   LDLCALC 52 11/27/2020   ALT 13 11/27/2020   AST 13 11/27/2020   NA 140 11/27/2020   K 4.6 11/27/2020   CL 102 11/27/2020   CREATININE 0.86 11/27/2020    BUN 15 11/27/2020   CO2 29 11/27/2020   TSH 1.48 11/27/2020   PSA 0.01 (L) 11/27/2020   INR 1.0 01/22/2019   HGBA1C 7.6 (H) 11/27/2020   MICROALBUR 1.5 11/27/2020   Assessment/Plan:  Orien O Rix is a 79 y.o. Black or African American [2] male with  has a past medical history of Acquired right foot drop, B12 deficiency, Chronic constipation, ED (erectile dysfunction), History of kidney stones, Hyperlipidemia, Hyperplasia of prostate with lower urinary tract symptoms (LUTS), Hypertension, Insomnia, Neuropathy, peripheral, Presence of surgical incision (01/21/2019), Prostate cancer ([facility]) (urologist-- dr eskridge/  oncologist-  dr manning), Renal cyst, right, Type 2 diabetes mellitus (HCC), and Wears partial dentures.  Encounter for well adult exam with abnormal findings Age and sex appropriate education and counseling updated with regular exercise and diet Referrals for preventative services - for colonoscopy, hep c screen Immunizations addressed - for Tdap Smoking counseling  - none   needed Evidence for depression or other mood disorder - none significant Most recent labs reviewed. I have personally reviewed and have noted: 1) the patient's medical and social history 2) The patient's current medications and supplements 3) The patient's height, weight, and BMI have been recorded in the chart   Diabetes Lab Results  Component Value Date   HGBA1C 7.6 (H) 11/27/2020   Stable, pt to continue current medical treatment precose, glucotrol, januvia    Vitamin B12 deficiency Lab Results  Component Value Date   VITAMINB12 591 11/27/2020   Stable, cont oral replacement - b12 1000 mcg qd   Essential hypertension Mild elevated today, but  BP Readings from Last 3 Encounters:  11/27/20 (!) 152/74  05/30/20 (!) 150/90  04/05/20 128/80   Stable, pt to continue medical treatment -losartan   Hyperlipidemia Lab Results  Component Value Date   LDLCALC 52 11/27/2020   Stable, pt  to continue current statin zocor  Current Outpatient Medications (Endocrine & Metabolic):  .  acarbose (PRECOSE) 50 MG tablet, TAKE 1 TABLET BY MOUTH THREE TIMES DAILY WITH MEALS .  glipiZIDE (GLUCOTROL XL) 10 MG 24 hr tablet, TAKE 1 TABLET(10 MG) BY MOUTH DAILY WITH BREAKFAST .  predniSONE (DELTASONE) 10 MG tablet, 3 tabs by mouth per day for 3 days,2tabs per day for 3 days,1tab per day for 3 days .  sitaGLIPtin (JANUVIA) 100 MG tablet, Take 1 tablet (100 mg total) by mouth daily.  Current Outpatient Medications (Cardiovascular):  .  losartan (COZAAR) 50 MG tablet, TAKE 1 TABLET(50 MG) BY MOUTH DAILY .  simvastatin (ZOCOR) 20 MG tablet, TAKE 1 TABLET(20 MG) BY MOUTH AT BEDTIME Annual appt due in May must see provider for future refills   Current Outpatient Medications (Analgesics):  .  aspirin EC 81 MG tablet, Take 1 tablet (81 mg total) by mouth daily. .  meloxicam (MOBIC) 15 MG tablet, TAKE 1 TABLET(15 MG) BY MOUTH DAILY  Current Outpatient Medications (Hematological):  .  vitamin B-12 (CYANOCOBALAMIN) 1000 MCG tablet, Take 1,000 mcg by mouth daily.  Current Outpatient Medications (Other):  .  blood glucose meter kit and supplies KIT, Inject 1 each into the skin 2 (two) times daily. Dispense based on patient and insurance preference. Use up to four times daily as directed. (FOR ICD-9 250.00, 250.01). .  Blood Glucose Monitoring Suppl (FREESTYLE LITE) DEVI, USE TO CHECK GLUCOSE UP TO 4 TIMES DAILY AS DIRECTED .  gabapentin (NEURONTIN) 300 MG capsule, Take 300 mg by mouth 3 (three) times daily. .  glucose blood (FREESTYLE TEST STRIPS) test strip, Use to check blood sugars twice a day .  ID NOW COVID-19 KIT, See admin instructions. .  Lancets (FREESTYLE) lancets, USE TO CHECK BLOOD SUGARS TWICE DAILY. DX: E11.9 .  oxybutynin (DITROPAN) 5 MG tablet, Take 5 mg by mouth at bedtime. .  PEG-KCl-NaCl-NaSulf-Na Asc-C (PLENVU) 140 g SOLR, Take 140 g by mouth as directed. .  tamsulosin (FLOMAX)  0.4 MG CAPS capsule, Take 0.4 mg by mouth daily.   Vitamin D deficiency Last vitamin D Lab Results  Component Value Date   VD25OH 23.06 (L) 11/27/2020   Low to start oral replacement   Followup: Return in about 6 months (around 05/30/2021).  James John, MD 12/03/2020 11:03 PM  [facility]  

## 2020-11-28 LAB — HEPATITIS C ANTIBODY
Hepatitis C Ab: NONREACTIVE
SIGNAL TO CUT-OFF: 0 (ref <1.00)

## 2020-11-30 ENCOUNTER — Encounter: Payer: Self-pay | Admitting: Internal Medicine

## 2020-12-03 ENCOUNTER — Encounter: Payer: Self-pay | Admitting: Internal Medicine

## 2020-12-03 DIAGNOSIS — E559 Vitamin D deficiency, unspecified: Secondary | ICD-10-CM | POA: Insufficient documentation

## 2020-12-03 NOTE — Assessment & Plan Note (Signed)
Lab Results  Component Value Date   LDLCALC 52 11/27/2020   Stable, pt to continue current statin zocor  Current Outpatient Medications (Endocrine & Metabolic):  .  acarbose (PRECOSE) 50 MG tablet, TAKE 1 TABLET BY MOUTH THREE TIMES DAILY WITH MEALS .  glipiZIDE (GLUCOTROL XL) 10 MG 24 hr tablet, TAKE 1 TABLET(10 MG) BY MOUTH DAILY WITH BREAKFAST .  predniSONE (DELTASONE) 10 MG tablet, 3 tabs by mouth per day for 3 days,2tabs per day for 3 days,1tab per day for 3 days .  sitaGLIPtin (JANUVIA) 100 MG tablet, Take 1 tablet (100 mg total) by mouth daily.  Current Outpatient Medications (Cardiovascular):  .  losartan (COZAAR) 50 MG tablet, TAKE 1 TABLET(50 MG) BY MOUTH DAILY .  simvastatin (ZOCOR) 20 MG tablet, TAKE 1 TABLET(20 MG) BY MOUTH AT BEDTIME Annual appt due in May must see provider for future refills   Current Outpatient Medications (Analgesics):  .  aspirin EC 81 MG tablet, Take 1 tablet (81 mg total) by mouth daily. .  meloxicam (MOBIC) 15 MG tablet, TAKE 1 TABLET(15 MG) BY MOUTH DAILY  Current Outpatient Medications (Hematological):  .  vitamin B-12 (CYANOCOBALAMIN) 1000 MCG tablet, Take 1,000 mcg by mouth daily.  Current Outpatient Medications (Other):  .  blood glucose meter kit and supplies KIT, Inject 1 each into the skin 2 (two) times daily. Dispense based on patient and insurance preference. Use up to four times daily as directed. (FOR ICD-9 250.00, 250.01). .  Blood Glucose Monitoring Suppl (FREESTYLE LITE) DEVI, USE TO CHECK GLUCOSE UP TO 4 TIMES DAILY AS DIRECTED .  gabapentin (NEURONTIN) 300 MG capsule, Take 300 mg by mouth 3 (three) times daily. Marland Kitchen  glucose blood (FREESTYLE TEST STRIPS) test strip, Use to check blood sugars twice a day .  ID NOW COVID-19 KIT, See admin instructions. .  Lancets (FREESTYLE) lancets, USE TO CHECK BLOOD SUGARS TWICE DAILY. DX: E11.9 .  oxybutynin (DITROPAN) 5 MG tablet, Take 5 mg by mouth at bedtime. Marland Kitchen  PEG-KCl-NaCl-NaSulf-Na Asc-C  (PLENVU) 140 g SOLR, Take 140 g by mouth as directed. .  tamsulosin (FLOMAX) 0.4 MG CAPS capsule, Take 0.4 mg by mouth daily.

## 2020-12-03 NOTE — Assessment & Plan Note (Signed)
Age and sex appropriate education and counseling updated with regular exercise and diet Referrals for preventative services - for colonoscopy, hep c screen Immunizations addressed - for Tdap Smoking counseling  - none needed Evidence for depression or other mood disorder - none significant Most recent labs reviewed. I have personally reviewed and have noted: 1) the patient's medical and social history 2) The patient's current medications and supplements 3) The patient's height, weight, and BMI have been recorded in the chart

## 2020-12-03 NOTE — Assessment & Plan Note (Signed)
Last vitamin D Lab Results  Component Value Date   VD25OH 23.06 (L) 11/27/2020   Low to start oral replacement

## 2020-12-03 NOTE — Assessment & Plan Note (Addendum)
Lab Results  Component Value Date   HGBA1C 7.6 (H) 11/27/2020   Stable, pt to continue current medical treatment precose, glucotrol, [place]

## 2020-12-03 NOTE — Assessment & Plan Note (Signed)
Mild elevated today, but  BP Readings from Last 3 Encounters:  11/27/20 (!) 152/74  05/30/20 (!) 150/90  04/05/20 128/80   Stable, pt to continue medical treatment -losartan

## 2020-12-03 NOTE — Assessment & Plan Note (Signed)
Lab Results  Component Value Date   [id number] 035 11/27/2020   Stable, cont oral replacement - b12 1000 mcg qd

## 2020-12-20 ENCOUNTER — Other Ambulatory Visit: Payer: Self-pay | Admitting: Internal Medicine

## 2020-12-20 NOTE — Telephone Encounter (Signed)
Please refill as per office routine med refill policy (all routine meds refilled for 3 mo or monthly per pt preference up to one year from last visit, then month to month grace period for 3 mo, then further med refills will have to be denied)  

## 2020-12-21 ENCOUNTER — Other Ambulatory Visit: Payer: Self-pay | Admitting: Internal Medicine

## 2020-12-21 NOTE — Telephone Encounter (Signed)
Please refill as per office routine med refill policy (all routine meds refilled for 3 mo or monthly per pt preference up to one year from last visit, then month to month grace period for 3 mo, then further med refills will have to be denied)  

## 2021-01-09 ENCOUNTER — Telehealth: Payer: Self-pay | Admitting: Internal Medicine

## 2021-01-09 DIAGNOSIS — Z86010 Personal history of colonic polyps: Secondary | ICD-10-CM

## 2021-01-09 NOTE — Telephone Encounter (Signed)
   Patient requesting new referral to GI for screening colonoscopy

## 2021-01-12 NOTE — Telephone Encounter (Signed)
Ok this is done 

## 2021-01-12 NOTE — Telephone Encounter (Signed)
Called and spoke with pt, verb understanding.
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
## 2021-01-27 ENCOUNTER — Other Ambulatory Visit: Payer: Self-pay | Admitting: Internal Medicine

## 2021-01-31 DIAGNOSIS — Z23 Encounter for immunization: Secondary | ICD-10-CM | POA: Diagnosis not present

## 2021-02-06 ENCOUNTER — Encounter: Payer: Self-pay | Admitting: Gastroenterology

## 2021-02-08 ENCOUNTER — Other Ambulatory Visit: Payer: Self-pay

## 2021-02-08 MED ORDER — ACARBOSE 50 MG PO TABS: 50.0000 mg | ORAL_TABLET | Freq: Three times a day (TID) | ORAL | 1 refills | Status: DC

## 2021-02-23 ENCOUNTER — Telehealth: Payer: Self-pay | Admitting: Internal Medicine

## 2021-02-23 NOTE — Telephone Encounter (Signed)
1.Medication Requested:simvastatin (ZOCOR) 20 MG tablet   2. Pharmacy (Name, [place]): [facility] [place] [address], [facility]  Phone:  [phone] Fax:  [phone]   3. On Med List: yes  4. Last Visit with PCP: 5.23.22  5. Next visit date with PCP: 11.22.22   Agent: Please be advised that RX refills may take up to 3 business days. We ask that you follow-up with your pharmacy.

## 2021-02-26 MED ORDER — SIMVASTATIN 20 MG PO TABS: ORAL_TABLET | ORAL | 1 refills | Status: DC

## 2021-02-26 NOTE — Telephone Encounter (Signed)
Medication refilled to the pharmacy requested.

## 2021-03-19 ENCOUNTER — Encounter: Payer: Self-pay | Admitting: Gastroenterology

## 2021-03-19 ENCOUNTER — Other Ambulatory Visit: Payer: Self-pay

## 2021-03-19 ENCOUNTER — Ambulatory Visit (INDEPENDENT_AMBULATORY_CARE_PROVIDER_SITE_OTHER): Payer: Medicare Other | Admitting: Gastroenterology

## 2021-03-19 VITALS — BP 120/80 | HR 70 | Ht 66.0 in | Wt 158.5 lb

## 2021-03-19 DIAGNOSIS — Z86010 Personal history of colonic polyps: Secondary | ICD-10-CM | POA: Diagnosis not present

## 2021-03-19 DIAGNOSIS — Z800 Family history of malignant neoplasm of digestive organs: Secondary | ICD-10-CM

## 2021-03-19 NOTE — Patient Instructions (Signed)
If you are age 79 or older, your body mass index should be between 23-30. Your Body mass index is 25.58 kg/m. If this is out of the aforementioned range listed, please consider follow up with your Primary Care Provider.  If you are age 79 or younger, your body mass index should be between 19-25. Your Body mass index is 25.58 kg/m. If this is out of the aformentioned range listed, please consider follow up with your Primary Care Provider.   You have been scheduled for a colonoscopy. Please follow written instructions given to you at your visit today.  Please pick up your prep supplies at the pharmacy within the next 1-3 days. If you use inhalers (even only as needed), please bring them with you on the day of your procedure.   The [facility] providers would like to encourage you to use [facility] to communicate with providers for non-urgent requests or questions.  Due to long hold times on the telephone, sending your provider a message by [facility] may be a faster and more efficient way to get a response.  Please allow 48 business hours for a response.  Please remember that this is for non-urgent requests.   It was a pleasure to see you today!  Thank you for trusting me with your gastrointestinal care!    Scott E. Candis Schatz, MD

## 2021-03-19 NOTE — Progress Notes (Signed)
HPI : Alex Massey is a very pleasant 80 year old male with a history of prostate cancer and diabetes is referred to [name] by Dr. Cathlean Cower for consideration of surveillance colonoscopy.  The patient's last colonoscopy was in 2016 at which time a 5 mm tubular adenoma was removed from the rectum.  The recommendation at that time was to repeat in 5 years if the patient was still in good health.  Prior to that colonoscopy, a colonoscopy in 2006 was negative for any precancerous polyps (lymphoid aggregates were removed) He has a chronic mild anemia, with an iron panel last year that was not suggestive of iron deficiency. His brother was diagnosed with colon cancer at age 52. He denies any chronic GI symptoms. Occasionally he will have loose stools or constipation.  He occasionally sees blood on the toilet paper which is new.  He is active and walks 2.5 miles every day. He denies any chest pain or pressure or shortness of breath.   Past Medical History:  Diagnosis Date   Acquired right foot drop    post lumbar surgery,  wears brace   B12 deficiency    Chronic constipation    diet   ED (erectile dysfunction)    History of kidney stones    Hyperlipidemia    Hyperplasia of prostate with lower urinary tract symptoms (LUTS)    Hypertension    Insomnia    Neuropathy, peripheral    right foot   Presence of surgical incision 01/21/2019   s/p  left carpal tunnel release --  pt has dressing on,  denies pain or numbness   Prostate cancer [facility]) urologist-- dr eskridge/  oncologist-  dr Tammi Klippel   dx 08-04-2018,  Stage T1c, Gleason 4+4   Renal cyst, right    Type 2 diabetes mellitus ([place])    followed by pcp   Wears partial dentures    upper and lower     Past Surgical History:  Procedure Laterality Date   CARPAL TUNNEL RELEASE Bilateral left 01-21-2019;  right 1980s   CATARACT EXTRACTION Left 03/2020   CYSTOSCOPY N/A 01/26/2019   Procedure: CYSTOSCOPY FLEXIBLE;  Surgeon: Festus Aloe,  MD;  Location: [facility];  Service: Urology;  Laterality: N/A;  no seeds found in bladder   [place]  L4-5;  1998 L4--S1   LUMBAR EPIDURAL INJECTION  2009, 2006   dr newton   LUMBAR MICRODISCECTOMY  05-13-2002  dr Jenean Lindau  '@[facility]'    L4-5   RADIOACTIVE SEED IMPLANT N/A 01/26/2019   Procedure: RADIOACTIVE SEED IMPLANT/BRACHYTHERAPY IMPLANT;  Surgeon: Festus Aloe, MD;  Location: [facility];  Service: Urology;  Laterality: N/A;    54 seeds implanted   SPACE OAR INSTILLATION N/A 01/26/2019   Procedure: SPACE OAR INSTILLATION;  Surgeon: Festus Aloe, MD;  Location: [facility];  Service: Urology;  Laterality: N/A;   Family History  Problem Relation Age of Onset   Healthy Mother    Prostate cancer Brother    Diabetes Father    Colon cancer Brother 71   Prostate cancer Cousin        maternal   Prostate cancer Nephew    Social History   Tobacco Use   Smoking status: Former    Years: 20.00    Types: Cigarettes    Quit date: 01/25/2003    Years since quitting: 18.1   Smokeless tobacco: Never  Vaping Use   Vaping Use: Never used  Substance Use Topics  Alcohol use: Yes    Alcohol/week: 3.0 standard drinks    Types: 3 Glasses of wine per week   Drug use: Never   Current Outpatient Medications  Medication Sig Dispense Refill   acarbose (PRECOSE) 50 MG tablet Take 1 tablet (50 mg total) by mouth 3 (three) times daily with meals. 270 tablet 1   aspirin EC 81 MG tablet Take 1 tablet (81 mg total) by mouth daily. 150 tablet 2   blood glucose meter kit and supplies KIT Inject 1 each into the skin 2 (two) times daily. Dispense based on patient and insurance preference. Use up to four times daily as directed. (FOR ICD-9 250.00, 250.01). 1 each 0   Blood Glucose Monitoring Suppl (FREESTYLE LITE) DEVI USE TO CHECK GLUCOSE UP TO 4 TIMES DAILY AS DIRECTED     gabapentin (NEURONTIN) 300 MG capsule Take 300 mg by mouth 3 (three)  times daily.     glipiZIDE (GLUCOTROL XL) 10 MG 24 hr tablet TAKE 1 TABLET(10 MG) BY MOUTH DAILY WITH BREAKFAST 90 tablet 3   glucose blood (FREESTYLE TEST STRIPS) test strip Use to check blood sugars twice a day 180 each 1   ID NOW COVID-19 KIT See admin instructions.     Lancets (FREESTYLE) lancets USE TO CHECK BLOOD SUGARS TWICE DAILY. DX: E11.9 200 each 0   losartan (COZAAR) 50 MG tablet TAKE 1 TABLET(50 MG) BY MOUTH DAILY 90 tablet 2   meloxicam (MOBIC) 15 MG tablet TAKE 1 TABLET(15 MG) BY MOUTH DAILY 30 tablet 3   oxybutynin (DITROPAN) 5 MG tablet Take 5 mg by mouth at bedtime.     PEG-KCl-NaCl-NaSulf-Na Asc-C (PLENVU) 140 g SOLR Take 140 g by mouth as directed. 1 each 0   predniSONE (DELTASONE) 10 MG tablet 3 tabs by mouth per day for 3 days,2tabs per day for 3 days,1tab per day for 3 days 18 tablet 0   simvastatin (ZOCOR) 20 MG tablet TAKE 1 TABLET EVERY NIGHT AT BEDTIME. 90 tablet 1   sitaGLIPtin (JANUVIA) 100 MG tablet Take 1 tablet (100 mg total) by mouth daily. 90 tablet 3   tamsulosin (FLOMAX) 0.4 MG CAPS capsule Take 0.4 mg by mouth daily.     vitamin B-12 (CYANOCOBALAMIN) 1000 MCG tablet Take 1,000 mcg by mouth daily.     No current facility-administered medications for this visit.   Allergies  Allergen Reactions   Metformin And Related Other (See Comments)    GI upset, diarrhea, wt loss     Review of Systems: All systems reviewed and negative except where noted in HPI.    No results found.  Physical Exam: There were no vitals taken for this visit. Constitutional: Pleasant,well-developed, African-American male in no acute distress. HEENT: Normocephalic and atraumatic. Conjunctivae are normal. No scleral icterus. Cardiovascular: Normal rate, regular rhythm.  Pulmonary/chest: Effort normal and breath sounds normal. No wheezing, rales or rhonchi. Abdominal: Soft, nondistended, nontender. Bowel sounds active throughout. There are no masses palpable. No  hepatomegaly. Extremities: no edema Neurological: Alert and oriented to person place and time. Skin: Skin is warm and dry. No rashes noted. Psychiatric: Normal mood and affect. Behavior is normal.  CBC    Component Value Date/Time   WBC 3.6 (L) 11/27/2020 0914   RBC 4.52 11/27/2020 0914   HGB 11.7 (L) 11/27/2020 0914   HCT 36.0 (L) 11/27/2020 0914   PLT 123.0 (L) 11/27/2020 0914   MCV 79.7 11/27/2020 0914   MCH 25.9 (L) 01/22/2019 1014   MCHC 32.4  11/27/2020 0914   RDW 14.9 11/27/2020 0914   LYMPHSABS 1.0 11/27/2020 0914   MONOABS 0.4 11/27/2020 0914   EOSABS 0.1 11/27/2020 0914   BASOSABS 0.0 11/27/2020 0914    CMP     Component Value Date/Time   NA 140 11/27/2020 0914   K 4.6 11/27/2020 0914   CL 102 11/27/2020 0914   CO2 29 11/27/2020 0914   GLUCOSE 224 (H) 11/27/2020 0914   BUN 15 11/27/2020 0914   CREATININE 0.86 11/27/2020 0914   CALCIUM 10.0 11/27/2020 0914   PROT 6.7 11/27/2020 0914   ALBUMIN 4.5 11/27/2020 0914   AST 13 11/27/2020 0914   ALT 13 11/27/2020 0914   ALKPHOS 97 11/27/2020 0914   BILITOT 0.8 11/27/2020 0914   GFRNONAA >60 01/22/2019 1014   GFRAA >60 01/22/2019 1014    Colonoscopy 2016, 5 mm tubular adenoma, rec repeat in 5 years if pt in good health Colonoscopy 2006:  Small polyp (lymphoid aggregate)    3.6 Low   4.3  3.0 Low   5.1  5.0  3.9 Low   5.3     RBC 4.22 - 5.81 Mil/uL 4.52  4.97  4.12 Low   4.41 R  4.90  5.05 R  5.03   Hemoglobin 13.0 - 17.0 g/dL 11.7 Low   12.7 Low   10.9 Low   11.4 Low   12.9 Low   13.2  13.2   HCT 39.0 - 52.0 % 36.0 Low   39.5  33.5 Low   37.2 Low   38.8 Low   41.9  39.8   MCV 78.0 - 100.0 fl 79.7  79.5  81.3  84.4 R  79.3  83.0 R  79.1   MCHC 30.0 - 36.0 g/dL 32.4  32.2  32.4  30.6  33.2  31.5  33.1   RDW 11.5 - 15.5 % 14.9  15.2  14.9  14.1  14.5  14.1  14.8   Platelets 150.0 - 400.0 K/uL 123.0 Low   135.0 Low   111.0 Low   122 Low  R  123.0 Low   151 R  131.0 Low       9 mo ago 1 yr ago   Iron 42 - 165  ug/dL 122  69   Transferrin 212.0 - 360.0 mg/dL 276.0  251.0   Saturation Ratios 20.0 - 50.0 % 31.6  19.6 Low    Ferritin 22.0 - 322.0 ng/mL 411.4 High   306.1    Assessment 80 year old male with a family history of colon cancer and a personal history of nonadvanced adenoma in 2016.  He is an active, healthy 80 year old with no cardiopulmonary comorbidities and no concerning symptoms.  He has started seeing bright red blood per rectum recently, which is most likely hemorrhoids.  He has a chronic mild anemia with normal recent iron panel. We discussed the risks and benefits of further colon polyp surveillance, and I think given his overall good health and family history of colon cancer and his new hematochezia, that another colonoscopy is reasonable.  The patient was agreeable to this plan.  Family History of colon cancer Personal history of colon polyps Rectal bleeding  - Schedule for colonoscopy  The details, risks (including bleeding, perforation, infection, missed lesions, medication reactions and possible hospitalization or surgery if complications occur), benefits, and alternatives to colonoscopy with possible biopsy and possible polypectomy were discussed with the patient and he consents to proceed.   Alex Massey E. Candis Schatz,  MD [facility]

## 2021-03-22 DIAGNOSIS — Z961 Presence of intraocular lens: Secondary | ICD-10-CM | POA: Diagnosis not present

## 2021-03-22 DIAGNOSIS — H43813 Vitreous degeneration, bilateral: Secondary | ICD-10-CM | POA: Diagnosis not present

## 2021-03-22 DIAGNOSIS — E113291 Type 2 diabetes mellitus with mild nonproliferative diabetic retinopathy without macular edema, right eye: Secondary | ICD-10-CM | POA: Diagnosis not present

## 2021-03-22 LAB — HM DIABETES EYE EXAM

## 2021-03-27 ENCOUNTER — Other Ambulatory Visit: Payer: Self-pay

## 2021-03-27 ENCOUNTER — Ambulatory Visit (AMBULATORY_SURGERY_CENTER): Payer: Medicare Other | Admitting: Gastroenterology

## 2021-03-27 ENCOUNTER — Encounter: Payer: Self-pay | Admitting: Gastroenterology

## 2021-03-27 VITALS — BP 119/71 | HR 58 | Temp 97.1°F | Resp 18 | Ht 66.0 in | Wt 158.0 lb

## 2021-03-27 DIAGNOSIS — Z86010 Personal history of colonic polyps: Secondary | ICD-10-CM

## 2021-03-27 DIAGNOSIS — D123 Benign neoplasm of transverse colon: Secondary | ICD-10-CM | POA: Diagnosis not present

## 2021-03-27 DIAGNOSIS — D124 Benign neoplasm of descending colon: Secondary | ICD-10-CM

## 2021-03-27 DIAGNOSIS — Z1211 Encounter for screening for malignant neoplasm of colon: Secondary | ICD-10-CM | POA: Diagnosis not present

## 2021-03-27 DIAGNOSIS — D122 Benign neoplasm of ascending colon: Secondary | ICD-10-CM

## 2021-03-27 MED ORDER — SODIUM CHLORIDE 0.9 % IV SOLN: 500.0000 mL | Freq: Once | INTRAVENOUS | Status: DC

## 2021-03-27 NOTE — Patient Instructions (Signed)
Please read handouts provided. Continue present medications. Await pathology results.   YOU HAD AN ENDOSCOPIC PROCEDURE TODAY AT THE [facility]:   Refer to the procedure report that was given to you for any specific questions about what was found during the examination.  If the procedure report does not answer your questions, please call your gastroenterologist to clarify.  If you requested that your care partner not be given the details of your procedure findings, then the procedure report has been included in a sealed envelope for you to review at your convenience later.  YOU SHOULD EXPECT: Some feelings of bloating in the abdomen. Passage of more gas than usual.  Walking can help get rid of the air that was put into your GI tract during the procedure and reduce the bloating. If you had a lower endoscopy (such as a colonoscopy or flexible sigmoidoscopy) you may notice spotting of blood in your stool or on the toilet paper. If you underwent a bowel prep for your procedure, you may not have a normal bowel movement for a few days.  Please Note:  You might notice some irritation and congestion in your nose or some drainage.  This is from the oxygen used during your procedure.  There is no need for concern and it should clear up in a day or so.  SYMPTOMS TO REPORT IMMEDIATELY:  Following lower endoscopy (colonoscopy or flexible sigmoidoscopy):  Excessive amounts of blood in the stool  Significant tenderness or worsening of abdominal pains  Swelling of the abdomen that is new, acute  Fever of 100F or higher   For urgent or emergent issues, a gastroenterologist can be reached at any hour by calling [phone]. Do not use MyChart messaging for urgent concerns.    DIET:  We do recommend a small meal at first, but then you may proceed to your regular diet.  Drink plenty of fluids but you should avoid alcoholic beverages for 24 hours.  ACTIVITY:  You should plan to take it easy  for the rest of today and you should NOT DRIVE or use heavy machinery until tomorrow (because of the sedation medicines used during the test).    FOLLOW UP: Our staff will call the number listed on your records 48-72 hours following your procedure to check on you and address any questions or concerns that you may have regarding the information given to you following your procedure. If we do not reach you, we will leave a message.  We will attempt to reach you two times.  During this call, we will ask if you have developed any symptoms of COVID 19. If you develop any symptoms (ie: fever, flu-like symptoms, shortness of breath, cough etc.) before then, please call [phone].  If you test positive for Covid 19 in the 2 weeks post procedure, please call and report this information to us.    If any biopsies were taken you will be contacted by phone or by letter within the next 1-3 weeks.  Please call us at [phone] if you have not heard about the biopsies in 3 weeks.    SIGNATURES/CONFIDENTIALITY: You and/or your care partner have signed paperwork which will be entered into your electronic medical record.  These signatures attest to the fact that that the information above on your After Visit Summary has been reviewed and is understood.  Full responsibility of the confidentiality of this discharge information lies with you and/or your care-partner.  

## 2021-03-27 NOTE — Progress Notes (Signed)
History and Physical Interval Note: No changes in the patient's medical history or symptoms since his clinic visit on Sept 12th.  03/27/2021 7:55 AM  Otelia Sergeant  has presented today for endoscopic procedure(s), with the diagnosis of  Encounter Diagnosis  Name Primary?   History of colonic polyps Yes  .  The various methods of evaluation and treatment have been discussed with the patient and/or family. After consideration of risks, benefits and other options for treatment, the patient has consented to  the endoscopic procedure(s).   The patient's history has been reviewed, patient examined, no change in status, stable for endoscopic procedure(s).  I have reviewed the patient's chart and labs.  Questions were answered to the patient's satisfaction.    Katisha Shimizu E. Candis Schatz, MD [facility]

## 2021-03-27 NOTE — Progress Notes (Signed)
Called to room to assist during endoscopic procedure.  Patient ID and intended procedure confirmed with present staff. Received instructions for my participation in the procedure from the performing physician.  

## 2021-03-27 NOTE — Op Note (Signed)
[facility] Patient Name: Alex Massey Procedure Date: 03/27/2021 7:58 AM MRN: [id number] Endoscopist: Nicki Reaper E. Candis Schatz , MD Age: 80 Referring MD:  Date of Birth: Aug 03, 1940 Gender: Male Account #: [id number] Procedure:                Colonoscopy Indications:              High risk colon cancer surveillance: Personal                            history of colonic polyps, Family history of colon                            cancer in a first-degree relative before age 14                            years Medicines:                Monitored Anesthesia Care Procedure:                Pre-Anesthesia Assessment:                           - Prior to the procedure, a History and Physical                            was performed, and patient medications and                            allergies were reviewed. The patient's tolerance of                            previous anesthesia was also reviewed. The risks                            and benefits of the procedure and the sedation                            options and risks were discussed with the patient.                            All questions were answered, and informed consent                            was obtained. Prior Anticoagulants: The patient has                            taken no previous anticoagulant or antiplatelet                            agents except for aspirin. ASA Grade Assessment:                            III - A patient with severe systemic disease. After  reviewing the risks and benefits, the patient was                            deemed in satisfactory condition to undergo the                            procedure.                           After obtaining informed consent, the colonoscope                            was passed under direct vision. Throughout the                            procedure, the patient's blood pressure, pulse, and                            oxygen  saturations were monitored continuously. The                            CF [id number] #[id number] was introduced through the anus                            and advanced to the the terminal ileum, with                            identification of the appendiceal orifice and IC                            valve. The colonoscopy was performed without                            difficulty. The patient tolerated the procedure                            well. The quality of the bowel preparation was                            adequate. The terminal ileum, ileocecal valve,                            appendiceal orifice, and rectum were photographed. Scope In: 8:04:26 AM Scope Out: 8:37:41 AM Scope Withdrawal Time: 0 hours 25 minutes 49 seconds  Total Procedure Duration: 0 hours 33 minutes 15 seconds  Findings:                 The perianal and digital rectal examinations were                            normal. Pertinent negatives include normal                            sphincter tone and no palpable rectal lesions.  A 3 mm polyp was found in the descending colon. The                            polyp was sessile. The polyp was removed with a                            cold snare. Resection and retrieval were complete.                            Estimated blood loss was minimal.                           A 14 mm polyp was found in the proximal ascending                            colon. The polyp was sessile. The polyp was removed                            with a piecemeal technique using a cold snare.                            Resection and retrieval were complete. Estimated                            blood loss was minimal.                           A 6 mm polyp was found in the hepatic flexure. The                            polyp was sessile. The polyp was removed with a                            cold snare. Resection and retrieval were complete.                             Estimated blood loss was minimal.                           A 5 mm polyp was found in the descending colon. The                            polyp was flat. The polyp was removed with a cold                            snare. Resection and retrieval were complete.                            Estimated blood loss was minimal.                           Many small and large-mouthed diverticula were found  in the sigmoid colon, descending colon, transverse                            colon, ascending colon and cecum. There was                            narrowing of the colon in association with the                            diverticular opening. There was evidence of an                            impacted diverticulum.                           A localized area of vascular-pattern-decreased                            mucosa with neovascularization and friability was                            found in the distal rectum. A post polypectomy scar                            was seen in the distal rectum.                           The exam was otherwise normal throughout the                            examined colon.                           The terminal ileum appeared normal.                           Non-bleeding internal hemorrhoids were found.                           No additional abnormalities were found on                            retroflexion. Complications:            No immediate complications. Estimated Blood Loss:     Estimated blood loss was minimal. Impression:               - One 3 mm polyp in the descending colon, removed                            with a cold snare. Resected and retrieved.                           - One 14 mm polyp in the proximal ascending colon,  removed piecemeal using a cold snare. Resected and                            retrieved.                           - One 6 mm polyp at the hepatic flexure, removed                             with a cold snare. Resected and retrieved.                           - One 5 mm polyp in the descending colon, removed                            with a cold snare. Resected and retrieved.                           - Severe diverticulosis in the sigmoid colon, in                            the descending colon, in the transverse colon, in                            the ascending colon and in the cecum. There was                            narrowing of the colon in association with the                            diverticular opening. There was evidence of an                            impacted diverticulum.                           - Mucosal changes in the distal rectum consistent                            with radiation proctopathy.                           - Normal appearing polypectomy scar in distal                            rectum.                           - The examined portion of the ileum was normal.                           - Non-bleeding internal hemorrhoids. Recommendation:           - Patient has a contact number available for  emergencies. The signs and symptoms of potential                            delayed complications were discussed with the                            patient. Return to normal activities tomorrow.                            Written discharge instructions were provided to the                            patient.                           - Resume previous diet.                           - Continue present medications.                           - Await pathology results.                           - Would recommend against further surveillance                            colonoscopies unless pathology reports indicate                            presence of high grade dysplasia on any of the                            resected polyps. Olina Melfi E. Candis Schatz, MD 03/27/2021 8:48:36 AM This report has been  signed electronically.

## 2021-03-27 NOTE — Progress Notes (Signed)
Vss nad transferred to pacu 

## 2021-03-27 NOTE — Progress Notes (Signed)
VS by [place]. 

## 2021-03-29 ENCOUNTER — Telehealth: Payer: Self-pay

## 2021-03-29 NOTE — Telephone Encounter (Signed)
Left message on follow up call. 

## 2021-04-04 ENCOUNTER — Other Ambulatory Visit: Payer: Self-pay

## 2021-04-04 ENCOUNTER — Ambulatory Visit (INDEPENDENT_AMBULATORY_CARE_PROVIDER_SITE_OTHER): Payer: Medicare Other

## 2021-04-04 DIAGNOSIS — Z23 Encounter for immunization: Secondary | ICD-10-CM | POA: Diagnosis not present

## 2021-04-05 ENCOUNTER — Encounter: Payer: Self-pay | Admitting: Gastroenterology

## 2021-05-16 ENCOUNTER — Encounter: Payer: Self-pay | Admitting: Internal Medicine

## 2021-05-16 ENCOUNTER — Telehealth (INDEPENDENT_AMBULATORY_CARE_PROVIDER_SITE_OTHER): Payer: Medicare Other | Admitting: Internal Medicine

## 2021-05-16 ENCOUNTER — Telehealth: Payer: Self-pay | Admitting: Internal Medicine

## 2021-05-16 DIAGNOSIS — U071 COVID-19: Secondary | ICD-10-CM

## 2021-05-16 DIAGNOSIS — J309 Allergic rhinitis, unspecified: Secondary | ICD-10-CM | POA: Diagnosis not present

## 2021-05-16 DIAGNOSIS — E1165 Type 2 diabetes mellitus with hyperglycemia: Secondary | ICD-10-CM

## 2021-05-16 MED ORDER — NIRMATRELVIR/RITONAVIR (PAXLOVID)TABLET: 3.0000 | ORAL_TABLET | Freq: Two times a day (BID) | ORAL | 0 refills | Status: DC

## 2021-05-16 NOTE — Patient Instructions (Signed)
Please take all new medication as prescribed 

## 2021-05-16 NOTE — Progress Notes (Signed)
Patient ID: Alex Massey, male   DOB: Jan 19, 1941, 80 y.o.   MRN: [id number]  Cumulative time during 7-day interval 13 min, there was not an associated office visit for this concern within a 7 day period.  Verbal consent for services obtained from patient prior to services given.  Names of all persons present for services: Cathlean Cower, MD, patient  Chief complaint: covid +  History, background, results pertinent:  Here with c/o new finding covid + home testing Nov 7 after allergy symptoms ongoing for over 3 wks better with nasal steroid restart, but then other worsening symptoms from 4-5 days ago with HA, ST, very mild scant cough, minor loose stool and diarrhea and head congestion.  Wife with no symptoms.  No other sick contacts.  No prior covid infection.  Pt is s/p covid vax x 2 and booster x 1.  Denies sob or n/v.  No other acute complaints Pt denies chest pain, increased sob or doe, wheezing, orthopnea, PND, increased LE swelling, palpitations, dizziness or syncope.   Pt denies polydipsia, polyuria, or new focal neuro s/s     Past Medical History:  Diagnosis Date   Acquired right foot drop    post lumbar surgery,  wears brace   Allergy    B12 deficiency    Chronic constipation    diet   ED (erectile dysfunction)    History of kidney stones    Hyperlipidemia    Hyperplasia of prostate with lower urinary tract symptoms (LUTS)    Hypertension    Insomnia    Neuropathy, peripheral    right foot   Presence of surgical incision 01/21/2019   s/p  left carpal tunnel release --  pt has dressing on,  denies pain or numbness   Prostate cancer [facility]) urologist-- dr eskridge/  oncologist-  dr Tammi Klippel   dx 08-04-2018,  Stage T1c, Gleason 4+4   Renal cyst, right    Type 2 diabetes mellitus ([place])    followed by pcp   Wears partial dentures    upper and lower   Past Surgical History:  Procedure Laterality Date   CARPAL TUNNEL RELEASE Bilateral left 01-21-2019;  right 1980s   CATARACT  EXTRACTION Left 03/2020   CYSTOSCOPY N/A 01/26/2019   Procedure: CYSTOSCOPY FLEXIBLE;  Surgeon: Festus Aloe, MD;  Location: [facility];  Service: Urology;  Laterality: N/A;  no seeds found in bladder   [place]  L4-5;  1998 L4--S1   LUMBAR EPIDURAL INJECTION  2009, 2006   dr newton   LUMBAR MICRODISCECTOMY  05-13-2002  dr Jenean Lindau  '@[facility]'    L4-5   RADIOACTIVE SEED IMPLANT N/A 01/26/2019   Procedure: RADIOACTIVE SEED IMPLANT/BRACHYTHERAPY IMPLANT;  Surgeon: Festus Aloe, MD;  Location: [facility];  Service: Urology;  Laterality: N/A;    54 seeds implanted   SPACE OAR INSTILLATION N/A 01/26/2019   Procedure: SPACE OAR INSTILLATION;  Surgeon: Festus Aloe, MD;  Location: [facility];  Service: Urology;  Laterality: N/A;    reports that he quit smoking about 18 years ago. His smoking use included cigarettes. He has never used smokeless tobacco. He reports current alcohol use of about 3.0 standard drinks per week. He reports that he does not use drugs. family history includes Colon cancer (age of onset: 69) in his brother; Diabetes in his father; Healthy in his mother; Prostate cancer in his brother, cousin, and nephew. Allergies  Allergen Reactions   Metformin And Related Other (  See Comments)    GI upset, diarrhea, wt loss   Current Outpatient Medications on File Prior to Visit  Medication Sig Dispense Refill   acarbose (PRECOSE) 50 MG tablet Take 1 tablet (50 mg total) by mouth 3 (three) times daily with meals. 270 tablet 1   aspirin EC 81 MG tablet Take 1 tablet (81 mg total) by mouth daily. 150 tablet 2   blood glucose meter kit and supplies KIT Inject 1 each into the skin 2 (two) times daily. Dispense based on patient and insurance preference. Use up to four times daily as directed. (FOR ICD-9 250.00, 250.01). 1 each 0   Blood Glucose Monitoring Suppl (FREESTYLE LITE) DEVI USE TO CHECK GLUCOSE UP TO 4 TIMES DAILY AS  DIRECTED     gabapentin (NEURONTIN) 300 MG capsule Take 300 mg by mouth 3 (three) times daily.     glipiZIDE (GLUCOTROL XL) 10 MG 24 hr tablet TAKE 1 TABLET(10 MG) BY MOUTH DAILY WITH BREAKFAST 90 tablet 3   glucose blood (FREESTYLE TEST STRIPS) test strip Use to check blood sugars twice a day 180 each 1   Lancets (FREESTYLE) lancets USE TO CHECK BLOOD SUGARS TWICE DAILY. DX: E11.9 200 each 0   losartan (COZAAR) 50 MG tablet TAKE 1 TABLET(50 MG) BY MOUTH DAILY 90 tablet 2   meloxicam (MOBIC) 15 MG tablet TAKE 1 TABLET(15 MG) BY MOUTH DAILY 30 tablet 3   oxybutynin (DITROPAN) 5 MG tablet Take 5 mg by mouth at bedtime.     predniSONE (DELTASONE) 10 MG tablet 3 tabs by mouth per day for 3 days,2tabs per day for 3 days,1tab per day for 3 days 18 tablet 0   simvastatin (ZOCOR) 20 MG tablet TAKE 1 TABLET EVERY NIGHT AT BEDTIME. 90 tablet 1   sitaGLIPtin (JANUVIA) 100 MG tablet Take 1 tablet (100 mg total) by mouth daily. 90 tablet 3   tamsulosin (FLOMAX) 0.4 MG CAPS capsule Take 0.4 mg by mouth daily.     vitamin B-12 (CYANOCOBALAMIN) 1000 MCG tablet Take 1,000 mcg by mouth daily.     No current facility-administered medications on file prior to visit.   A/P/next steps:   1) covid infection - Recent onset with mild symptoms in older african [place] with hx of DM - for paxlovid course, cont vit d, add Vit C and zinc, and declines need for other symtpomatic meds such as for cough, nausea or diarrea.   to f/u any worsening symptoms or concerns  2)  dm - Stable, pt to continue current medical treatment precose, januvia, glucotrol  3)  allergic rhinitis - Also mild recent worsening uncontrolled - encouraged pt to continue the nasacort asd,  to f/u any worsening symptoms or concerns  Cathlean Cower MD

## 2021-05-16 NOTE — Assessment & Plan Note (Signed)
Also mild recent worsening uncontrolled - encouraged pt to continue the nasacort asd,  to f/u any worsening symptoms or concerns

## 2021-05-16 NOTE — Assessment & Plan Note (Signed)
Recent onset with mild symptoms in older african [place] with hx of DM - for paxlovid course, cont vit d, add Vit C and zinc, and declines need for other symtpomatic meds such as for cough, nausea or diarrea.   to f/u any worsening symptoms or concerns

## 2021-05-16 NOTE — Assessment & Plan Note (Addendum)
Lab Results  Component Value Date   HGBA1C 7.6 (H) 11/27/2020   Stable, pt to continue current medical treatment precose, januvia, glucotrol

## 2021-05-16 NOTE — Telephone Encounter (Signed)
[place] calling in  Had some questions about Paxlovid prescription sent in   Please call [phone]

## 2021-05-17 MED ORDER — NIRMATRELVIR/RITONAVIR (PAXLOVID)TABLET: 3.0000 | ORAL_TABLET | Freq: Two times a day (BID) | ORAL | 0 refills | Status: AC

## 2021-05-17 NOTE — Telephone Encounter (Signed)
Patient wants to inform provider he was able to have rx filled a [facility] on [address] and he has picked rx up

## 2021-05-17 NOTE — Telephone Encounter (Signed)
[place] sent to [facility]

## 2021-05-17 NOTE — Telephone Encounter (Signed)
Pharmacy states that they do not have Paxlovid in stock. Will need to send prescription to another location.

## 2021-05-29 ENCOUNTER — Ambulatory Visit: Payer: Medicare Other | Admitting: Internal Medicine

## 2021-06-04 ENCOUNTER — Encounter: Payer: Self-pay | Admitting: Internal Medicine

## 2021-06-04 ENCOUNTER — Other Ambulatory Visit: Payer: Self-pay

## 2021-06-04 ENCOUNTER — Ambulatory Visit (INDEPENDENT_AMBULATORY_CARE_PROVIDER_SITE_OTHER): Payer: Medicare Other | Admitting: Internal Medicine

## 2021-06-04 VITALS — BP 152/80 | HR 74 | Temp 98.4°F | Ht 66.0 in | Wt 160.0 lb

## 2021-06-04 DIAGNOSIS — E782 Mixed hyperlipidemia: Secondary | ICD-10-CM

## 2021-06-04 DIAGNOSIS — U071 COVID-19: Secondary | ICD-10-CM | POA: Diagnosis not present

## 2021-06-04 DIAGNOSIS — E1165 Type 2 diabetes mellitus with hyperglycemia: Secondary | ICD-10-CM

## 2021-06-04 DIAGNOSIS — E559 Vitamin D deficiency, unspecified: Secondary | ICD-10-CM

## 2021-06-04 DIAGNOSIS — E118 Type 2 diabetes mellitus with unspecified complications: Secondary | ICD-10-CM

## 2021-06-04 DIAGNOSIS — I10 Essential (primary) hypertension: Secondary | ICD-10-CM

## 2021-06-04 LAB — BASIC METABOLIC PANEL
BUN: 12 mg/dL (ref 6–23)
CO2: 29 mEq/L (ref 19–32)
Calcium: 10.1 mg/dL (ref 8.4–10.5)
Chloride: 101 mEq/L (ref 96–112)
Creatinine, Ser: 0.82 mg/dL (ref 0.40–1.50)
GFR: 83.62 mL/min (ref >60.00)
Glucose, Bld: 102 mg/dL — ABNORMAL HIGH (ref 70–99)
Potassium: 3.9 mEq/L (ref 3.5–5.1)
Sodium: 140 mEq/L (ref 135–145)

## 2021-06-04 LAB — LIPID PANEL
Cholesterol: 159 mg/dL (ref 0–200)
HDL: 85.6 mg/dL (ref >39.00)
LDL Cholesterol: 50 mg/dL (ref 0–99)
NonHDL: 73.08
Total CHOL/HDL Ratio: 2
Triglycerides: 113 mg/dL (ref 0.0–149.0)
VLDL: 22.6 mg/dL (ref 0.0–40.0)

## 2021-06-04 LAB — HEPATIC FUNCTION PANEL
ALT: 14 U/L (ref 0–53)
AST: 15 U/L (ref 0–37)
Albumin: 4.5 g/dL (ref 3.5–5.2)
Alkaline Phosphatase: 74 U/L (ref 39–117)
Bilirubin, Direct: 0.1 mg/dL (ref 0.0–0.3)
Total Bilirubin: 0.7 mg/dL (ref 0.2–1.2)
Total Protein: 7 g/dL (ref 6.0–8.3)

## 2021-06-04 LAB — HEMOGLOBIN A1C: Hgb A1c MFr Bld: 6.8 % — ABNORMAL HIGH (ref 4.6–6.5)

## 2021-06-04 NOTE — Patient Instructions (Addendum)
Please take OTC Vitamin D3 at 2000 units per day, indefinitely  Please continue all other medications as before, and refills have been done if requested.  Please have the pharmacy call with any other refills you may need.  Please continue your efforts at being more active, low cholesterol diet, and weight control.  Please keep your appointments with your specialists as you may have planned  Please go to the LAB at the blood drawing area for the tests to be done  You will be contacted by phone if any changes need to be made immediately.  Otherwise, you will receive a letter about your results with an explanation, but please check with MyChart first.  Please remember to sign up for MyChart if you have not done so, as this will be important to you in the future with finding out test results, communicating by private email, and scheduling acute appointments online when needed.  Please make an Appointment to return in 6 months, or sooner if needed

## 2021-06-04 NOTE — Progress Notes (Signed)
Patient ID: Alex Massey, male   DOB: 1940/09/21, 80 y.o.   MRN: [id number]       Chief Complaint: follow up HTN, HLD and hyperglycemia , recent covid infectio, low vit d       HPI:  Alex Massey is a 80 y.o. male here overall doing ok;  s/p recent covid infection 2 wks ago now essentailyl resolved, denies significant post covid symptoms such as fatigue or cough.  Pt denies chest pain, increased sob or doe, wheezing, orthopnea, PND, increased LE swelling, palpitations, dizziness or syncope.  Pt denies polydipsia, polyuria, or new focal neuro s/s.   Pt denies fever, wt loss, night sweats, loss of appetite, or other constitutional symptoms  No other new complaints  Not taking Vit d.  BP at home < 140/90 per pt       Wt Readings from Last 3 Encounters:  06/04/21 160 lb (72.6 kg)  03/27/21 158 lb (71.7 kg)  03/19/21 158 lb 8 oz (71.9 kg)   BP Readings from Last 3 Encounters:  06/04/21 (!) 152/80  03/27/21 119/71  03/19/21 120/80         Past Medical History:  Diagnosis Date   Acquired right foot drop    post lumbar surgery,  wears brace   Allergy    B12 deficiency    Chronic constipation    diet   ED (erectile dysfunction)    History of kidney stones    Hyperlipidemia    Hyperplasia of prostate with lower urinary tract symptoms (LUTS)    Hypertension    Insomnia    Neuropathy, peripheral    right foot   Presence of surgical incision 01/21/2019   s/p  left carpal tunnel release --  pt has dressing on,  denies pain or numbness   Prostate cancer [facility]) urologist-- dr eskridge/  oncologist-  dr Tammi Klippel   dx 08-04-2018,  Stage T1c, Gleason 4+4   Renal cyst, right    Type 2 diabetes mellitus ([place])    followed by pcp   Wears partial dentures    upper and lower   Past Surgical History:  Procedure Laterality Date   CARPAL TUNNEL RELEASE Bilateral left 01-21-2019;  right 1980s   CATARACT EXTRACTION Left 03/2020   CYSTOSCOPY N/A 01/26/2019   Procedure: CYSTOSCOPY FLEXIBLE;   Surgeon: Festus Aloe, MD;  Location: [facility];  Service: Urology;  Laterality: N/A;  no seeds found in bladder   [place]  L4-5;  1998 L4--S1   LUMBAR EPIDURAL INJECTION  2009, 2006   dr Ernestina Patches   LUMBAR MICRODISCECTOMY  05-13-2002  dr Jenean Lindau  _0    L4-5   RADIOACTIVE SEED IMPLANT N/A 01/26/2019   Procedure: RADIOACTIVE SEED IMPLANT/BRACHYTHERAPY IMPLANT;  Surgeon: Festus Aloe, MD;  [facility];  Service: Urology;  Laterality: N/A;    54 seeds implanted   SPACE OAR INSTILLATION N/A 01/26/2019   Procedure: SPACE OAR INSTILLATION;  Surgeon: Festus Aloe, MD;  Location: [facility];  Service: Urology;  Laterality: N/A;    reports that he quit smoking about 18 years ago. His smoking use included cigarettes. He has never used smokeless tobacco. He reports current alcohol use of about 3.0 standard drinks per week. He reports that he does not use drugs. family history includes Colon cancer (age of onset: 57) in his brother; Diabetes in his father; Healthy in his mother; Prostate cancer in his brother, cousin, and nephew. Allergies  Allergen  Reactions   Metformin And Related Other (See Comments)    GI upset, diarrhea, wt loss   Current Outpatient Medications on File Prior to Visit  Medication Sig Dispense Refill   acarbose (PRECOSE) 50 MG tablet Take 1 tablet (50 mg total) by mouth 3 (three) times daily with meals. 270 tablet 1   aspirin EC 81 MG tablet Take 1 tablet (81 mg total) by mouth daily. 150 tablet 2   blood glucose meter kit and supplies KIT Inject 1 each into the skin 2 (two) times daily. Dispense based on patient and insurance preference. Use up to four times daily as directed. (FOR ICD-9 250.00, 250.01). 1 each 0   Blood Glucose Monitoring Suppl (FREESTYLE LITE) DEVI USE TO CHECK GLUCOSE UP TO 4 TIMES DAILY AS DIRECTED     gabapentin (NEURONTIN) 300 MG capsule Take 300 mg by mouth 3 (three) times  daily.     glipiZIDE (GLUCOTROL XL) 10 MG 24 hr tablet TAKE 1 TABLET(10 MG) BY MOUTH DAILY WITH BREAKFAST 90 tablet 3   glucose blood (FREESTYLE TEST STRIPS) test strip Use to check blood sugars twice a day 180 each 1   Lancets (FREESTYLE) lancets USE TO CHECK BLOOD SUGARS TWICE DAILY. DX: E11.9 200 each 0   losartan (COZAAR) 50 MG tablet TAKE 1 TABLET(50 MG) BY MOUTH DAILY 90 tablet 2   meloxicam (MOBIC) 15 MG tablet TAKE 1 TABLET(15 MG) BY MOUTH DAILY 30 tablet 3   oxybutynin (DITROPAN) 5 MG tablet Take 5 mg by mouth at bedtime.     predniSONE (DELTASONE) 10 MG tablet 3 tabs by mouth per day for 3 days,2tabs per day for 3 days,1tab per day for 3 days 18 tablet 0   simvastatin (ZOCOR) 20 MG tablet TAKE 1 TABLET EVERY NIGHT AT BEDTIME. 90 tablet 1   tamsulosin (FLOMAX) 0.4 MG CAPS capsule Take 0.4 mg by mouth daily.     vitamin B-12 (CYANOCOBALAMIN) 1000 MCG tablet Take 1,000 mcg by mouth daily.     No current facility-administered medications on file prior to visit.        ROS:  All others reviewed and negative.  Objective        PE:  BP (!) 152/80 (BP Location: Right Arm, Patient Position: Sitting, Cuff Size: Large)   Pulse 74   Temp 98.4 F (36.9 C) (Oral)   Ht _0  (1.676 m)   Wt 160 lb (72.6 kg)   SpO2 98%   BMI 25.82 kg/m                 Constitutional: Pt appears in NAD               HENT: Head: NCAT.                Right Ear: External ear normal.                 Left Ear: External ear normal.                Eyes: . Pupils are equal, round, and reactive to light. Conjunctivae and EOM are normal               Nose: without d/c or deformity               Neck: Neck supple. Gross normal ROM               Cardiovascular: Normal rate and regular rhythm.  Pulmonary/Chest: Effort normal and breath sounds without rales or wheezing.                Abd:  Soft, NT, ND, + BS, no organomegaly               Neurological: Pt is alert. At baseline orientation, motor  grossly intact               Skin: Skin is warm. No rashes, no other new lesions, LE edema - none               Psychiatric: Pt behavior is normal without agitation   Micro: none  Cardiac tracings I have personally interpreted today:  none  Pertinent Radiological findings (summarize): none   Lab Results  Component Value Date   WBC 3.6 (L) 11/27/2020   HGB 11.7 (L) 11/27/2020   HCT 36.0 (L) 11/27/2020   PLT 123.0 (L) 11/27/2020   GLUCOSE 102 (H) 06/04/2021   CHOL 159 06/04/2021   TRIG 113.0 06/04/2021   HDL 85.60 06/04/2021   LDLCALC 50 06/04/2021   ALT 14 06/04/2021   AST 15 06/04/2021   NA 140 06/04/2021   K 3.9 06/04/2021   CL 101 06/04/2021   CREATININE 0.82 06/04/2021   BUN 12 06/04/2021   CO2 29 06/04/2021   TSH 1.48 11/27/2020   PSA 0.01 (L) 11/27/2020   INR 1.0 01/22/2019   HGBA1C 6.8 (H) 06/04/2021   MICROALBUR 1.5 11/27/2020   Assessment/Plan:  Alex Massey is a 80 y.o. Black or African American [2] male with  has a past medical history of Acquired right foot drop, Allergy, B12 deficiency, Chronic constipation, ED (erectile dysfunction), History of kidney stones, Hyperlipidemia, Hyperplasia of prostate with lower urinary tract symptoms (LUTS), Hypertension, Insomnia, Neuropathy, peripheral, Presence of surgical incision (01/21/2019), Prostate cancer [facility]) (urologist-- dr eskridge/  oncologist-  dr Tammi Klippel), Renal cyst, right, Type 2 diabetes mellitus ([place]), and Wears partial dentures.  Vitamin D deficiency Last vitamin D Lab Results  Component Value Date   VD25OH 23.06 (L) 11/27/2020   Low, to start oral replacement   Diabetes ([place]) Lab Results  Component Value Date   HGBA1C 6.8 (H) 06/04/2021   Stable, pt to continue current medical treatment glucotrol, jardiance   Hyperlipidemia Lab Results  Component Value Date   LDLCALC 50 06/04/2021   Stable, pt to continue current statin zocor 20   Essential hypertension BP Readings from Last 3  Encounters:  06/04/21 (!) 152/80  03/27/21 119/71  03/19/21 120/80   Mild uncontrolled, pt states BP at home controlled, pt to continue medical treatment losartan   COVID-19 virus infection Clinically resolved,  to f/u any worsening symptoms or concerns  Followup: Return in about 6 months (around 12/02/2021).  Cathlean Cower, MD 06/10/2021 8:04 AM [facility]

## 2021-06-04 NOTE — Assessment & Plan Note (Signed)
Last vitamin D Lab Results  Component Value Date   VD25OH 23.06 (L) 11/27/2020   Low, to start oral replacement

## 2021-06-05 MED ORDER — SITAGLIPTIN PHOSPHATE 100 MG PO TABS: 100.0000 mg | ORAL_TABLET | Freq: Every day | ORAL | 3 refills | Status: DC

## 2021-06-10 ENCOUNTER — Encounter: Payer: Self-pay | Admitting: Internal Medicine

## 2021-06-10 NOTE — Assessment & Plan Note (Signed)
Clinically resolved,  to f/u any worsening symptoms or concerns 

## 2021-06-10 NOTE — Assessment & Plan Note (Signed)
BP Readings from Last 3 Encounters:  06/04/21 (!) 152/80  03/27/21 119/71  03/19/21 120/80   Mild uncontrolled, pt states BP at home controlled, pt to continue medical treatment losartan

## 2021-06-10 NOTE — Assessment & Plan Note (Signed)
Lab Results  Component Value Date   LDLCALC 50 06/04/2021   Stable, pt to continue current statin zocor 20

## 2021-06-10 NOTE — Assessment & Plan Note (Signed)
Lab Results  Component Value Date   HGBA1C 6.8 (H) 06/04/2021   Stable, pt to continue current medical treatment glucotrol, jardiance

## 2021-07-03 ENCOUNTER — Telehealth: Payer: Self-pay | Admitting: Internal Medicine

## 2021-07-03 NOTE — Telephone Encounter (Signed)
LVM for pt to rtn my call to schedule awv with nha.. Please schedule AWV if pt calls the office.  

## 2021-07-18 ENCOUNTER — Telehealth: Payer: Self-pay | Admitting: Internal Medicine

## 2021-07-18 NOTE — Telephone Encounter (Signed)
Form has been reprinted from the [employer] and filled out. Patient notified and will come by to sign form so that it can be faxed

## 2021-07-18 NOTE — Telephone Encounter (Signed)
Patient states he gave no cost rx forms to be completed to the provider for sitaGLIPtin (JANUVIA) 100 MG tablet at 06-04-2021 ov   Patient states forms were to be sent to mail order pharmacy in order to receive the medication at no cost  Patient states he has not received the medication and he is "running out"  Patient does not know the name of the mail order pharmacy  Please advice

## 2021-07-18 NOTE — Telephone Encounter (Signed)
Sorry, I dont recall anything about those forms,  thanks

## 2021-07-20 ENCOUNTER — Ambulatory Visit (INDEPENDENT_AMBULATORY_CARE_PROVIDER_SITE_OTHER): Payer: Medicare Other

## 2021-07-20 DIAGNOSIS — Z789 Other specified health status: Secondary | ICD-10-CM

## 2021-07-20 DIAGNOSIS — Z0000 Encounter for general adult medical examination without abnormal findings: Secondary | ICD-10-CM | POA: Diagnosis not present

## 2021-07-20 NOTE — Progress Notes (Signed)
Subjective:   Alex Massey is a 81 y.o. male who presents for an Subsequent Medicare Annual Wellness Visit.   I connected with Alex Massey today by telephone and verified that I am speaking with the correct person using two identifiers. Location patient: home Location provider: work Persons participating in the virtual visit: patient, provider.   I discussed the limitations, risks, security and privacy concerns of performing an evaluation and management service by telephone and the availability of in person appointments. I also discussed with the patient that there may be a patient responsible charge related to this service. The patient expressed understanding and verbally consented to this telephonic visit.    Interactive audio and video telecommunications were attempted between this provider and patient, however failed, due to patient having technical difficulties OR patient did not have access to video capability.  We continued and completed visit with audio only.    Review of Systems     Cardiac Risk Factors include: advanced age (>51mn, >>24women);diabetes mellitus;dyslipidemia;male gender;hypertension     Objective:    Today's Vitals   There is no height or weight on file to calculate BMI.  Advanced Directives 07/20/2021 03/27/2021 05/31/2020 01/26/2019 01/20/2015 01/06/2015  Does Patient Have a Medical Advance Directive? Yes No No Yes No No  Type of Advance Directive Living will - - Living will - -  Does patient want to make changes to medical advance directive? - - - Yes (MAU/Ambulatory/Procedural Areas - Information given) - -  Would patient like information on creating a medical advance directive? - No - Patient declined No - Patient declined - No - patient declined information No - patient declined information    Current Medications (verified) Outpatient Encounter Medications as of 07/20/2021  Medication Sig   acarbose (PRECOSE) 50 MG tablet Take 1 tablet (50 mg  total) by mouth 3 (three) times daily with meals.   aspirin EC 81 MG tablet Take 1 tablet (81 mg total) by mouth daily.   blood glucose meter kit and supplies KIT Inject 1 each into the skin 2 (two) times daily. Dispense based on patient and insurance preference. Use up to four times daily as directed. (FOR ICD-9 250.00, 250.01).   Blood Glucose Monitoring Suppl (FREESTYLE LITE) DEVI USE TO CHECK GLUCOSE UP TO 4 TIMES DAILY AS DIRECTED   gabapentin (NEURONTIN) 300 MG capsule Take 300 mg by mouth 3 (three) times daily.   glipiZIDE (GLUCOTROL XL) 10 MG 24 hr tablet TAKE 1 TABLET(10 MG) BY MOUTH DAILY WITH BREAKFAST   glucose blood (FREESTYLE TEST STRIPS) test strip Use to check blood sugars twice a day   Lancets (FREESTYLE) lancets USE TO CHECK BLOOD SUGARS TWICE DAILY. DX: E11.9   losartan (COZAAR) 50 MG tablet TAKE 1 TABLET(50 MG) BY MOUTH DAILY   meloxicam (MOBIC) 15 MG tablet TAKE 1 TABLET(15 MG) BY MOUTH DAILY   oxybutynin (DITROPAN) 5 MG tablet Take 5 mg by mouth at bedtime.   simvastatin (ZOCOR) 20 MG tablet TAKE 1 TABLET EVERY NIGHT AT BEDTIME.   sitaGLIPtin (JANUVIA) 100 MG tablet Take 1 tablet (100 mg total) by mouth daily.   tamsulosin (FLOMAX) 0.4 MG CAPS capsule Take 0.4 mg by mouth daily.   vitamin B-12 (CYANOCOBALAMIN) 1000 MCG tablet Take 1,000 mcg by mouth daily.   predniSONE (DELTASONE) 10 MG tablet 3 tabs by mouth per day for 3 days,2tabs per day for 3 days,1tab per day for 3 days (Patient not taking: Reported on 07/20/2021)  No facility-administered encounter medications on file as of 07/20/2021.    Allergies (verified) Metformin and related   History: Past Medical History:  Diagnosis Date   Acquired right foot drop    post lumbar surgery,  wears brace   Allergy    B12 deficiency    Chronic constipation    diet   ED (erectile dysfunction)    History of kidney stones    Hyperlipidemia    Hyperplasia of prostate with lower urinary tract symptoms (LUTS)     Hypertension    Insomnia    Neuropathy, peripheral    right foot   Presence of surgical incision 01/21/2019   s/p  left carpal tunnel release --  pt has dressing on,  denies pain or numbness   Prostate cancer [facility]) urologist-- dr eskridge/  oncologist-  dr Tammi Klippel   dx 08-04-2018,  Stage T1c, Gleason 4+4   Renal cyst, right    Type 2 diabetes mellitus ([place])    followed by pcp   Wears partial dentures    upper and lower   Past Surgical History:  Procedure Laterality Date   CARPAL TUNNEL RELEASE Bilateral left 01-21-2019;  right 1980s   CATARACT EXTRACTION Left 03/2020   CYSTOSCOPY N/A 01/26/2019   Procedure: CYSTOSCOPY FLEXIBLE;  Surgeon: Festus Aloe, MD;  Location: [facility];  Service: Urology;  Laterality: N/A;  no seeds found in bladder   [place]  L4-5;  1998 L4--S1   LUMBAR EPIDURAL INJECTION  2009, 2006   dr Ernestina Patches   LUMBAR MICRODISCECTOMY  05-13-2002  dr Jenean Lindau  _0    L4-5   RADIOACTIVE SEED IMPLANT N/A 01/26/2019   Procedure: RADIOACTIVE SEED IMPLANT/BRACHYTHERAPY IMPLANT;  Surgeon: Festus Aloe, MD;  Location[facility]  Service: Urology;  Laterality: N/A;    54 seeds implanted   SPACE OAR INSTILLATION N/A 01/26/2019   Procedure: SPACE OAR INSTILLATION;  Surgeon: Festus Aloe, MD;  Location[facility];  Service: Urology;  Laterality: N/A;   Family History  Problem Relation Age of Onset   Healthy Mother    Diabetes Father    Prostate cancer Brother    Colon cancer Brother 8   Prostate cancer Cousin        maternal   Prostate cancer Nephew    Rectal cancer Neg Hx    Stomach cancer Neg Hx    Social History   Socioeconomic History   Marital status: Married    Spouse name: Not on file   Number of children: 2   Years of education: 14   Highest education level: Not on file  Occupational History    Comment: retired  Tobacco Use   Smoking status: Former    Years: 20.00    Types:  Cigarettes    Quit date: 01/25/2003    Years since quitting: 18.4   Smokeless tobacco: Never  Vaping Use   Vaping Use: Never used  Substance and Sexual Activity   Alcohol use: Yes    Alcohol/week: 3.0 standard drinks    Types: 3 Glasses of wine per week   Drug use: Never   Sexual activity: Not on file  Other Topics Concern   Not on file  Social History Narrative   HSG 2 years trade school. married 1965. 2 daughters- '66, '71. 2 grandchldren. 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   Social Determinants of Health   Financial Resource Strain: Low Risk    Difficulty  of Paying Living Expenses: Not hard at all  Food Insecurity: No Food Insecurity   Worried About [place] in the Last Year: Never true   [place] in the Last Year: Never true  Transportation Needs: No Transportation Needs   Lack of Transportation (Medical): No   Lack of Transportation (Non-Medical): No  Physical Activity: Sufficiently Active   Days of Exercise per Week: 5 days   Minutes of Exercise per Session: 40 min  Stress: No Stress Concern Present   Feeling of Stress : Not at all  Social Connections: Moderately Integrated   Frequency of Communication with Friends and Family: Twice a week   Frequency of Social Gatherings with Friends and Family: Twice a week   Attends Religious Services: More than 4 times per year   Active Member of [employer] or Organizations: No   Attends Music therapist: Never   Marital Status: Married    Tobacco Counseling Counseling given: Not Answered   Clinical Intake:  Pre-visit preparation completed: Yes  Pain : No/denies pain     Nutritional Risks: None Diabetes: Yes CBG done?: No Did pt. bring in CBG monitor from home?: No  How often do you need to have someone help you when you read instructions, pamphlets, or other written materials from your doctor or pharmacy?: 1 - Never What is the last grade level you completed  in school?: High school  Diabetic?yes  Nutrition Risk Assessment:  Has the patient had any N/V/D within the last 2 months?  No  Does the patient have any non-healing wounds?  No  Has the patient had any unintentional weight loss or weight gain?  No   Diabetes:  Is the patient diabetic?  Yes  If diabetic, was a CBG obtained today?  No  Did the patient bring in their glucometer from home?  No  How often do you monitor your CBG's? 3 x week .   Financial Strains and Diabetes Management:  Are you having any financial strains with the device, your supplies or your medication? No .  Does the patient want to be seen by Chronic Care Management for management of their diabetes?  No  Would the patient like to be referred to a Nutritionist or for Diabetic Management?  No   Diabetic Exams:  Diabetic Eye Exam: Completed 02/2021 Diabetic Foot Exam: Overdue, Pt has been advised about the importance in completing this exam. Pt is scheduled for diabetic foot exam on next office visit .   Interpreter Needed?: No  Information entered by :: [place] of Daily Living In your present state of health, do you have any difficulty performing the following activities: 07/20/2021 06/04/2021  Hearing? N N  Vision? N N  Difficulty concentrating or making decisions? N N  Walking or climbing stairs? N N  Dressing or bathing? N N  Doing errands, shopping? N N  Preparing Food and eating ? N -  Using the Toilet? N -  In the past six months, have you accidently leaked urine? N -  Do you have problems with loss of bowel control? N -  Managing your Medications? N -  Managing your Finances? N -  Housekeeping or managing your Housekeeping? N -  Some recent data might be hidden    Patient Care Team: Biagio Borg, MD as PCP - General (Internal Medicine) Newt Minion, MD (Orthopedic Surgery) Lafayette Dragon, MD (Inactive) (Gastroenterology) Marygrace Drought, MD (Ophthalmology) Ernestina Patches,  Haze Rushing, MD (Physical Medicine and Rehabilitation)  Indicate any recent Medical Services you may have received from other than [facility] providers in the past year (date may be approximate).     Assessment:   This is a routine wellness examination for [facility].  Hearing/Vision screen Vision Screening - Comments:: Annual eye exams wear glasses   Dietary issues and exercise activities discussed: Current Exercise Habits: Home exercise routine, Type of exercise: walking, Time (Minutes): 40, Frequency (Times/Week): 5, Weekly Exercise (Minutes/Week): 200, Intensity: Mild, Exercise limited by: None identified   Goals Addressed   None    Depression Screen PHQ 2/9 Scores 07/20/2021 07/20/2021 11/27/2020 05/31/2020 11/25/2019 11/25/2019 11/24/2018  PHQ - 2 Score 0 0 0 0 0 0 0    Fall Risk Fall Risk  07/20/2021 11/27/2020 05/31/2020 11/25/2019 11/25/2019  Falls in the past year? 0 0 0 0 0  Number falls in past yr: 0 0 0 - 0  Injury with Fall? 0 0 0 - 0  Risk for fall due to : - - No Fall Risks - No Fall Risks  Follow up Falls evaluation completed - Falls evaluation completed - Falls evaluation completed    FALL RISK PREVENTION PERTAINING TO THE HOME:  Any stairs in or around the home? No  If so, are there any without handrails? No  Home free of loose throw rugs in walkways, pet beds, electrical cords, etc? Yes  Adequate lighting in your home to reduce risk of falls? Yes   ASSISTIVE DEVICES UTILIZED TO PREVENT FALLS:  Life alert? No  Use of a cane, walker or w/c? No  Grab bars in the bathroom? Yes  Shower chair or bench in shower? No  Elevated toilet seat or a handicapped toilet? Yes   Cognitive Function:  Normal cognitive status assessed by direct observation by this Nurse Health Advisor. No abnormalities found.        Immunizations Immunization History  Administered Date(s) Administered   Fluad Quad(high Dose 65+) 04/06/2020, 04/04/2021   Influenza Split 04/09/2011, 04/13/2012    Influenza Whole 04/04/2008, 04/19/2009, 04/12/2010   Influenza, High Dose Seasonal PF 05/13/2013, 03/14/2016, 03/28/2017, 03/16/2018   Influenza,inj,Quad PF,6+ Mos 04/06/2014   Influenza-Unspecified 05/08/2019   Moderna Sars-Covid-2 Vaccination 09/03/2019, 11/10/2019, 04/18/2020   Pneumococcal Conjugate-13 09/12/2014   Pneumococcal Polysaccharide-23 01/02/2010   Td 01/02/2010   Tdap 11/27/2020   Zoster, Live 01/07/2012    TDAP status: Up to date  Flu Vaccine status: Up to date  Pneumococcal vaccine status: Up to date  Covid-19 vaccine status: Completed vaccines  Qualifies for Shingles Vaccine? Yes   Zostavax completed No   Shingrix Completed?: No.    Education has been provided regarding the importance of this vaccine. Patient has been advised to call insurance company to determine out of pocket expense if they have not yet received this vaccine. Advised may also receive vaccine at local pharmacy or Health Dept. Verbalized acceptance and understanding.  Screening Tests Health Maintenance  Topic Date Due   Zoster Vaccines- Shingrix (1 of 2) Never done   COVID-19 Vaccine (4 - Booster for Moderna series) 06/13/2020   FOOT EXAM  11/27/2021   HEMOGLOBIN A1C  12/02/2021   OPHTHALMOLOGY EXAM  03/22/2022   COLONOSCOPY (Pts 45-42yr Insurance coverage will need to be confirmed)  03/27/2026   TETANUS/TDAP  11/28/2030   Pneumonia Vaccine 80 Years old  Completed   INFLUENZA VACCINE  Completed   Hepatitis C Screening  Completed   HPV VACCINES  Aged Out  Health Maintenance  Health Maintenance Due  Topic Date Due   Zoster Vaccines- Shingrix (1 of 2) Never done   COVID-19 Vaccine (4 - Booster for Moderna series) 06/13/2020    Colorectal cancer screening: No longer required.   Lung Cancer Screening: (Low Dose CT Chest recommended if Age 76-80 years, 30 pack-year currently smoking OR have quit w/in 15years.) does not qualify.   Lung Cancer Screening Referral: n/a  Additional  Screening:  Hepatitis C Screening: does not qualify;   Vision Screening: Recommended annual ophthalmology exams for early detection of glaucoma and other disorders of the eye. Is the patient up to date with their annual eye exam?  Yes  Who is the provider or what is the name of the office in which the patient attends annual eye exams? Dr.Tanner  If pt is not established with a provider, would they like to be referred to a provider to establish care? No .   Dental Screening: Recommended annual dental exams for proper oral hygiene  Community Resource Referral / Chronic Care Management: CRR required this visit?  Yes   CCM required this visit?  No      Plan:     I have personally reviewed and noted the following in the patients chart:   Medical and social history Use of alcohol, tobacco or illicit drugs  Current medications and supplements including opioid prescriptions. Patient is not currently taking opioid prescriptions. Functional ability and status Nutritional status Physical activity Advanced directives List of other physicians Hospitalizations, surgeries, and ER visits in previous 12 months Vitals Screenings to include cognitive, depression, and falls Referrals and appointments  In addition, I have reviewed and discussed with patient certain preventive protocols, quality metrics, and best practice recommendations. A written personalized care plan for preventive services as well as general preventive health recommendations were provided to patient.     Randel Pigg, LPN   [id number]   Nurse Notes: Needs assistance with applying for medication assistance , states the papers keep getting lost with in the office delaying patient receiving the diabetic medication Januvia .

## 2021-07-20 NOTE — Patient Instructions (Signed)
[name] , Thank you for taking time to come for your Medicare Wellness Visit. I appreciate your ongoing commitment to your health goals. Please review the following plan we discussed and let me know if I can assist you in the future.   Screening recommendations/referrals: Colonoscopy: no longer required  Recommended yearly ophthalmology/optometry visit for glaucoma screening and checkup Recommended yearly dental visit for hygiene and checkup  Vaccinations: Influenza vaccine: completed  Pneumococcal vaccine: completed  Tdap vaccine: 11/27/2020 Shingles vaccine: will consider     Advanced directives: yes   Conditions/risks identified: Needs assistance with applying for medication assistance , states the papers keep getting lost with in the office delaying patient receiving the diabetic medication  Januvia   Next appointment: none   Preventive Care 80 Years and Older, Male Preventive care refers to lifestyle choices and visits with your health care provider that can promote health and wellness. What does preventive care include? A yearly physical exam. This is also called an annual well check. Dental exams once or twice a year. Routine eye exams. Ask your health care provider how often you should have your eyes checked. Personal lifestyle choices, including: Daily care of your teeth and gums. Regular physical activity. Eating a healthy diet. Avoiding tobacco and drug use. Limiting alcohol use. Practicing safe sex. Taking low doses of aspirin every day. Taking vitamin and mineral supplements as recommended by your health care provider. What happens during an annual well check? The services and screenings done by your health care provider during your annual well check will depend on your age, overall health, lifestyle risk factors, and family history of disease. Counseling  Your health care provider may ask you questions about your: Alcohol use. Tobacco use. Drug  use. Emotional well-being. Home and relationship well-being. Sexual activity. Eating habits. History of falls. Memory and ability to understand (cognition). Work and work Statistician. Screening  You may have the following tests or measurements: Height, weight, and BMI. Blood pressure. Lipid and cholesterol levels. These may be checked every 5 years, or more frequently if you are over 80 years old. Skin check. Lung cancer screening. You may have this screening every year starting at age 80 if you have a 30-pack-year history of smoking and currently smoke or have quit within the past 15 years. Fecal occult blood test (FOBT) of the stool. You may have this test every year starting at age 80. Flexible sigmoidoscopy or colonoscopy. You may have a sigmoidoscopy every 5 years or a colonoscopy every 10 years starting at age 71. Prostate cancer screening. Recommendations will vary depending on your family history and other risks. Hepatitis C blood test. Hepatitis B blood test. Sexually transmitted disease (STD) testing. Diabetes screening. This is done by checking your blood sugar (glucose) after you have not eaten for a while (fasting). You may have this done every 1-3 years. Abdominal aortic aneurysm (AAA) screening. You may need this if you are a current or former smoker. Osteoporosis. You may be screened starting at age 60 if you are at high risk. Talk with your health care provider about your test results, treatment options, and if necessary, the need for more tests. Vaccines  Your health care provider may recommend certain vaccines, such as: Influenza vaccine. This is recommended every year. Tetanus, diphtheria, and acellular pertussis (Tdap, Td) vaccine. You may need a Td booster every 10 years. Zoster vaccine. You may need this after age 80. Pneumococcal 13-valent conjugate (PCV13) vaccine. One dose is recommended after age 80.  Pneumococcal polysaccharide (PPSV23) vaccine. One dose is  recommended after age 80. Talk to your health care provider about which screenings and vaccines you need and how often you need them. This information is not intended to replace advice given to you by your health care provider. Make sure you discuss any questions you have with your health care provider. Document Released: 07/21/2015 Document Revised: 03/13/2016 Document Reviewed: 04/25/2015 Elsevier Interactive Patient Education  2017 [place] Prevention in the Home Falls can cause injuries. They can happen to people of all ages. There are many things you can do to make your home safe and to help prevent falls. What can I do on the outside of my home? Regularly fix the edges of walkways and driveways and fix any cracks. Remove anything that might make you trip as you walk through a door, such as a raised step or threshold. Trim any bushes or trees on the path to your home. Use bright outdoor lighting. Clear any walking paths of anything that might make someone trip, such as rocks or tools. Regularly check to see if handrails are loose or broken. Make sure that both sides of any steps have handrails. Any raised decks and porches should have guardrails on the edges. Have any leaves, snow, or ice cleared regularly. Use sand or salt on walking paths during winter. Clean up any spills in your garage right away. This includes oil or grease spills. What can I do in the bathroom? Use night lights. Install grab bars by the toilet and in the tub and shower. Do not use towel bars as grab bars. Use non-skid mats or decals in the tub or shower. If you need to sit down in the shower, use a plastic, non-slip stool. Keep the floor dry. Clean up any water that spills on the floor as soon as it happens. Remove soap buildup in the tub or shower regularly. Attach bath mats securely with double-sided non-slip rug tape. Do not have throw rugs and other things on the floor that can make you  trip. What can I do in the bedroom? Use night lights. Make sure that you have a light by your bed that is easy to reach. Do not use any sheets or blankets that are too big for your bed. They should not hang down onto the floor. Have a firm chair that has side arms. You can use this for support while you get dressed. Do not have throw rugs and other things on the floor that can make you trip. What can I do in the kitchen? Clean up any spills right away. Avoid walking on wet floors. Keep items that you use a lot in easy-to-reach places. If you need to reach something above you, use a strong step stool that has a grab bar. Keep electrical cords out of the way. Do not use floor polish or wax that makes floors slippery. If you must use wax, use non-skid floor wax. Do not have throw rugs and other things on the floor that can make you trip. What can I do with my stairs? Do not leave any items on the stairs. Make sure that there are handrails on both sides of the stairs and use them. Fix handrails that are broken or loose. Make sure that handrails are as long as the stairways. Check any carpeting to make sure that it is firmly attached to the stairs. Fix any carpet that is loose or worn. Avoid having throw rugs at the  top or bottom of the stairs. If you do have throw rugs, attach them to the floor with carpet tape. Make sure that you have a light switch at the top of the stairs and the bottom of the stairs. If you do not have them, ask someone to add them for you. What else can I do to help prevent falls? Wear shoes that: Do not have high heels. Have rubber bottoms. Are comfortable and fit you well. Are closed at the toe. Do not wear sandals. If you use a stepladder: Make sure that it is fully opened. Do not climb a closed stepladder. Make sure that both sides of the stepladder are locked into place. Ask someone to hold it for you, if possible. Clearly mark and make sure that you can  see: Any grab bars or handrails. First and last steps. Where the edge of each step is. Use tools that help you move around (mobility aids) if they are needed. These include: Canes. Walkers. Scooters. Crutches. Turn on the lights when you go into a dark area. Replace any light bulbs as soon as they burn out. Set up your furniture so you have a clear path. Avoid moving your furniture around. If any of your floors are uneven, fix them. If there are any pets around you, be aware of where they are. Review your medicines with your doctor. Some medicines can make you feel dizzy. This can increase your chance of falling. Ask your doctor what other things that you can do to help prevent falls. This information is not intended to replace advice given to you by your health care provider. Make sure you discuss any questions you have with your health care provider. Document Released: 04/20/2009 Document Revised: 11/30/2015 Document Reviewed: 07/29/2014 Elsevier Interactive Patient Education  2017 [employer].

## 2021-07-23 ENCOUNTER — Telehealth: Payer: Self-pay

## 2021-07-23 ENCOUNTER — Telehealth: Payer: Self-pay | Admitting: Internal Medicine

## 2021-07-23 NOTE — Chronic Care Management (AMB) (Signed)
°  Chronic Care Management   Note  07/23/2021 Name: Alex Massey MRN: [id number] DOB: March 02, 1941  Alex Massey is a 81 y.o. year old male who is a primary care patient of Biagio Borg, MD. I reached out to Otelia Sergeant by phone today in response to a referral sent by Mr. Marcial Pacas Anne's PCP, Biagio Borg, MD.   Mr. Masterson was given information about Chronic Care Management services today including:  CCM service includes personalized support from designated clinical staff supervised by his physician, including individualized plan of care and coordination with other care providers 24/7 contact phone numbers for assistance for urgent and routine care needs. Service will only be billed when office clinical staff spend 20 minutes or more in a month to coordinate care. Only one practitioner may furnish and bill the service in a calendar month. The patient may stop CCM services at any time (effective at the end of the month) by phone call to the office staff.   Patient agreed to services and verbal consent obtained.   Follow up plan:   Tatjana Secretary/administrator

## 2021-07-23 NOTE — Progress Notes (Signed)
° ° °  Chronic Care Management Pharmacy Assistant   Name: Alex Massey  MRN: [id number] DOB: September 17, 1940  Alex Massey is an 81 y.o. year old male who presents for his initial CCM visit with the clinical pharmacist.  Reason for Encounter: Initial Visit    Recent office visits:  06/04/21 Biagio Borg, MD-PCP (Vitamin D deficiency) Labs ordered, no med changes  Recent consult visits:  03/19/21 Daryel November, MD-Gastroenterology (colonoscopy)  Hospital visits:  None in previous 6 months  Medications: Outpatient Encounter Medications as of 07/23/2021  Medication Sig   acarbose (PRECOSE) 50 MG tablet Take 1 tablet (50 mg total) by mouth 3 (three) times daily with meals.   aspirin EC 81 MG tablet Take 1 tablet (81 mg total) by mouth daily.   blood glucose meter kit and supplies KIT Inject 1 each into the skin 2 (two) times daily. Dispense based on patient and insurance preference. Use up to four times daily as directed. (FOR ICD-9 250.00, 250.01).   Blood Glucose Monitoring Suppl (FREESTYLE LITE) DEVI USE TO CHECK GLUCOSE UP TO 4 TIMES DAILY AS DIRECTED   gabapentin (NEURONTIN) 300 MG capsule Take 300 mg by mouth 3 (three) times daily.   glipiZIDE (GLUCOTROL XL) 10 MG 24 hr tablet TAKE 1 TABLET(10 MG) BY MOUTH DAILY WITH BREAKFAST   glucose blood (FREESTYLE TEST STRIPS) test strip Use to check blood sugars twice a day   Lancets (FREESTYLE) lancets USE TO CHECK BLOOD SUGARS TWICE DAILY. DX: E11.9   losartan (COZAAR) 50 MG tablet TAKE 1 TABLET(50 MG) BY MOUTH DAILY   meloxicam (MOBIC) 15 MG tablet TAKE 1 TABLET(15 MG) BY MOUTH DAILY   oxybutynin (DITROPAN) 5 MG tablet Take 5 mg by mouth at bedtime.   predniSONE (DELTASONE) 10 MG tablet 3 tabs by mouth per day for 3 days,2tabs per day for 3 days,1tab per day for 3 days (Patient not taking: Reported on 07/20/2021)   simvastatin (ZOCOR) 20 MG tablet TAKE 1 TABLET EVERY NIGHT AT BEDTIME.   sitaGLIPtin (JANUVIA) 100 MG tablet Take 1  tablet (100 mg total) by mouth daily.   tamsulosin (FLOMAX) 0.4 MG CAPS capsule Take 0.4 mg by mouth daily.   vitamin B-12 (CYANOCOBALAMIN) 1000 MCG tablet Take 1,000 mcg by mouth daily.   No facility-administered encounter medications on file as of 07/23/2021.   Medication List: acarbose (PRECOSE) 50 MG tablet-last fill 05/15/21 90 ds aspirin EC 81 MG tablet blood glucose meter kit and supplies KIT Blood Glucose Monitoring Suppl (FREESTYLE LITE) DEVI gabapentin (NEURONTIN) 300 MG capsule-last fill 03/09/20 60 ds glipiZIDE (GLUCOTROL XL) 10 MG 24 hr tablet-last fill 11/09/18 90 ds glucose blood (FREESTYLE TEST STRIPS) test strip Lancets (FREESTYLE) lancets losartan (COZAAR) 50 MG tablet-last fill 05/23/21 90 ds meloxicam (MOBIC) 15 MG tablet-last fill 05/31/20 90 ds oxybutynin (DITROPAN) 5 MG tablet-last fill 06/06/21 90 ds predniSONE (DELTASONE) 10 MG tablet simvastatin (ZOCOR) 20 MG tablet-last fill 05/30/21 90 ds sitaGLIPtin (JANUVIA) 100 MG tablet tamsulosin (FLOMAX) 0.4 MG CAPS capsule-last fill 04/28/21 90 ds vitamin B-12 (CYANOCOBALAMIN) 1000 MCG tablet  Care Gaps: Colonoscopy-03/27/21 Diabetic Foot Exam-11/27/20 Ophthalmology-03/22/21 Dexa Scan - NA Annual Well Visit - NA Micro albumin-NA Hemoglobin A1c- 06/04/21  Star Rating Drugs: lsimvastatin (ZOCOR) 20 MG-last fill  05/30/21 90 ds losartan (COZAAR) 50 MG-last fill 05/23/21 90 ds  Ethelene Hal Clinical Pharmacist Assistant [phone]

## 2021-07-25 ENCOUNTER — Telehealth: Payer: Self-pay

## 2021-07-25 ENCOUNTER — Ambulatory Visit (INDEPENDENT_AMBULATORY_CARE_PROVIDER_SITE_OTHER): Payer: Medicare Other

## 2021-07-25 DIAGNOSIS — E782 Mixed hyperlipidemia: Secondary | ICD-10-CM

## 2021-07-25 DIAGNOSIS — E1165 Type 2 diabetes mellitus with hyperglycemia: Secondary | ICD-10-CM

## 2021-07-25 DIAGNOSIS — I10 Essential (primary) hypertension: Secondary | ICD-10-CM

## 2021-07-25 NOTE — Progress Notes (Signed)
Chronic Care Management Pharmacy Note  07/25/2021 Name:  Alex Massey MRN:  [id number] DOB:  Apr 02, 1941  Summary: -Patient reports compliance to all of his medications, notes that [facility] application has been sent for patient assistance (Januvia) has not received word of status of medication -BG at home averaging 90-120 - denies any issues with hypoglycemia  -Report to checking BP at home once weekly, could not recall BP measurements - notes has not been >140/90  Recommendations/Changes made from today's visit: -Recommending no changes to medications - patient to continue monitoring blood pressure at least once weekly, and blood sugars 3 times weekly  -Called [employer] - reports patient has been approved, needs patient attestation questions answered and mailed back - confirmed with patient he has completed and has sent back -Patient to reach out with any medication issues or concerns   Plan: F/u in 6 months   Subjective: Alex Massey is an 81 y.o. year old male who is a primary patient of Jenny Reichmann, Hunt Oris, MD.  The CCM team was consulted for assistance with disease management and care coordination needs.    Engaged with patient by telephone for initial visit in response to provider referral for pharmacy case management and/or care coordination services.   Consent to Services:  The patient was given the following information about Chronic Care Management services today, agreed to services, and gave verbal consent: 1. CCM service includes personalized support from designated clinical staff supervised by the primary care provider, including individualized plan of care and coordination with other care providers 2. 24/7 contact phone numbers for assistance for urgent and routine care needs. 3. Service will only be billed when office clinical staff spend 20 minutes or more in a month to coordinate care. 4. Only one practitioner may furnish and bill the service in a calendar month. 5.The patient  may stop CCM services at any time (effective at the end of the month) by phone call to the office staff. 6. The patient will be responsible for cost sharing (co-pay) of up to 20% of the service fee (after annual deductible is met). Patient agreed to services and consent obtained.  Patient Care Team: Biagio Borg, MD as PCP - General (Internal Medicine) Newt Minion, MD (Orthopedic Surgery) Lafayette Dragon, MD (Inactive) (Gastroenterology) Marygrace Drought, MD (Ophthalmology) Magnus Sinning, MD (Physical Medicine and Rehabilitation) Delice Bison Darnelle Maffucci, [facility] as Pharmacist (Pharmacist)  Recent office visits: 06/04/2021 - Dr. Jenny Reichmann - recent COVID infection (2 weeks ago) - start vitamin D 2000units daily  05/16/2021 - Dr. Jenny Reichmann - Televideo visit - COVID positive - paxlovid rx'd   Recent consult visits: 03/19/2021 - Dr. Candis Schatz Gertie Fey - scheduled for colonoscopy   Hospital visits: None in previous 6 months  Objective:  Lab Results  Component Value Date   CREATININE 0.82 06/04/2021   BUN 12 06/04/2021   GFR 83.62 06/04/2021   GFRNONAA >60 01/22/2019   GFRAA >60 01/22/2019   NA 140 06/04/2021   K 3.9 06/04/2021   CALCIUM 10.1 06/04/2021   CO2 29 06/04/2021   GLUCOSE 102 (H) 06/04/2021    Lab Results  Component Value Date/Time   HGBA1C 6.8 (H) 06/04/2021 12:37 PM   HGBA1C 7.6 (H) 11/27/2020 09:14 AM   GFR 83.62 06/04/2021 12:37 PM   GFR 82.73 11/27/2020 09:14 AM   MICROALBUR 1.5 11/27/2020 09:14 AM   MICROALBUR <0.7 11/24/2018 01:46 PM    Last diabetic Eye exam:  Lab Results  Component Value Date/Time  HMDIABEYEEXA Retinopathy (A) 03/22/2021 12:00 AM    Last diabetic Foot exam:  Lab Results  Component Value Date/Time   HMDIABFOOTEX Diabetic retinopathy 01/16/2009 12:00 AM     Lab Results  Component Value Date   CHOL 159 06/04/2021   HDL 85.60 06/04/2021   LDLCALC 50 06/04/2021   TRIG 113.0 06/04/2021   CHOLHDL 2 06/04/2021    Hepatic Function Latest Ref Rng &  Units 06/04/2021 11/27/2020 05/30/2020  Total Protein 6.0 - 8.3 g/dL 7.0 6.7 7.2  Albumin 3.5 - 5.2 g/dL 4.5 4.5 -  AST 0 - 37 U/L 15 13 -  ALT 0 - 53 U/L 14 13 -  Alk Phosphatase 39 - 117 U/L 74 97 -  Total Bilirubin 0.2 - 1.2 mg/dL 0.7 0.8 -  Bilirubin, Direct 0.0 - 0.3 mg/dL 0.1 0.2 -    Lab Results  Component Value Date/Time   TSH 1.48 11/27/2020 09:14 AM   TSH 1.59 05/30/2020 09:56 AM    CBC Latest Ref Rng & Units 11/27/2020 05/30/2020 11/25/2019  WBC 4.0 - 10.5 K/uL 3.6(L) 4.3 3.0(L)  Hemoglobin 13.0 - 17.0 g/dL 11.7(L) 12.7(L) 10.9(L)  Hematocrit 39.0 - 52.0 % 36.0(L) 39.5 33.5(L)  Platelets 150.0 - 400.0 K/uL 123.0(L) 135.0(L) 111.0(L)    Lab Results  Component Value Date/Time   VD25OH 23.06 (L) 11/27/2020 09:14 AM   VD25OH 13.05 (L) 11/25/2019 10:32 AM    Clinical ASCVD: No  The ASCVD Risk score (Arnett DK, et al., 2019) failed to calculate for the following reasons:   The systolic blood pressure is missing    Depression screen [facility] 2/9 07/20/2021 07/20/2021 11/27/2020  Decreased Interest 0 0 0  Down, Depressed, Hopeless 0 0 0  PHQ - 2 Score 0 0 0  Some recent data might be hidden     Social History   Tobacco Use  Smoking Status Former   Years: 20.00   Types: Cigarettes   Quit date: 01/25/2003   Years since quitting: 18.5  Smokeless Tobacco Never   BP Readings from Last 3 Encounters:  06/04/21 (!) 152/80  03/27/21 119/71  03/19/21 120/80   Pulse Readings from Last 3 Encounters:  06/04/21 74  03/27/21 (!) 58  03/19/21 70   Wt Readings from Last 3 Encounters:  06/04/21 160 lb (72.6 kg)  03/27/21 158 lb (71.7 kg)  03/19/21 158 lb 8 oz (71.9 kg)   BMI Readings from Last 3 Encounters:  06/04/21 25.82 kg/m  03/27/21 25.50 kg/m  03/19/21 25.58 kg/m    Assessment/Interventions: Review of patient past medical history, allergies, medications, health status, including review of consultants reports, laboratory and other test data, was performed as part  of comprehensive evaluation and provision of chronic care management services.   SDOH:  (Social Determinants of Health) assessments and interventions performed: Yes  SDOH Screenings   Alcohol Screen: Low Risk    Last Alcohol Screening Score (AUDIT): 1  Depression (PHQ2-9): Low Risk    PHQ-2 Score: 0  Financial Resource Strain: Low Risk    Difficulty of Paying Living Expenses: Not hard at all  Food Insecurity: No Food Insecurity   Worried About Charity fundraiser in the Last Year: Never true   Ran Out of Food in the Last Year: Never true  Housing: Low Risk    Last Housing Risk Score: 0  Physical Activity: Sufficiently Active   Days of Exercise per Week: 5 days   Minutes of Exercise per Session: 40 min  Social Connections: Moderately  Integrated   Frequency of Communication with Friends and Family: Twice a week   Frequency of Social Gatherings with Friends and Family: Twice a week   Attends Religious Services: More than 4 times per year   Active Member of [employer] or Organizations: No   Attends Music therapist: Never   Marital Status: Married  Stress: No Stress Concern Present   Feeling of Stress : Not at all  Tobacco Use: Medium Risk   Smoking Tobacco Use: Former   Smokeless Tobacco Use: Never   Passive Exposure: Not on [employer] consultant Needs: No Transportation Needs   Lack of Transportation (Medical): No   Lack of Transportation (Non-Medical): No    CCM Care Plan  Allergies  Allergen Reactions   Metformin And Related Other (See Comments)    GI upset, diarrhea, wt loss    Medications Reviewed Today     Reviewed by Tomasa Blase, [facility] (Pharmacist) on 07/25/21 at [place] List Status: <None>   Medication Order Taking? Sig Documenting Provider Last Dose Status Informant  acarbose (PRECOSE) 50 MG tablet [id number] Yes Take 1 tablet (50 mg total) by mouth 3 (three) times daily with meals. Biagio Borg, MD Taking Active   aspirin EC 81 MG tablet  [id number] Yes Take 1 tablet (81 mg total) by mouth daily. Rowe Clack, MD Taking Active Self  blood glucose meter kit and supplies KIT [id number] Yes Inject 1 each into the skin 2 (two) times daily. Dispense based on patient and insurance preference. Use up to four times daily as directed. (FOR ICD-9 250.00, 250.01). Lance Sell, NP Taking Active   Blood Glucose Monitoring Suppl (FREESTYLE LITE) Kerrin Mo [id number] Yes USE TO CHECK GLUCOSE UP TO 4 TIMES DAILY AS DIRECTED [provider] Taking Active   Cholecalciferol (VITAMIN D) 50 MCG (2000 UT) CAPS [id number] Yes Take 1 capsule by mouth daily. [provider] Taking Active   gabapentin (NEURONTIN) 300 MG capsule [id number] Yes Take 300 mg by mouth 3 (three) times daily. [provider] Taking Active   glipiZIDE (GLUCOTROL XL) 10 MG 24 hr tablet [id number] Yes TAKE 1 TABLET(10 MG) BY MOUTH DAILY WITH BREAKFAST Janith Lima, MD Taking Active   glucose blood (FREESTYLE TEST STRIPS) test strip [id number] Yes Use to check blood sugars twice a day Biagio Borg, MD Taking Active   Lancets (FREESTYLE) lancets [id number] Yes USE TO CHECK BLOOD SUGARS TWICE DAILY. DX: E11.9 Biagio Borg, MD Taking Active   losartan (COZAAR) 50 MG tablet [id number] Yes TAKE 1 TABLET(50 MG) BY MOUTH DAILY Biagio Borg, MD Taking Active   meloxicam (MOBIC) 15 MG tablet [id number] Yes TAKE 1 TABLET(15 MG) BY MOUTH DAILY Mcarthur Rossetti, MD Taking Active   oxybutynin (DITROPAN) 5 MG tablet [id number] Yes Take 5 mg by mouth at bedtime. [provider] Taking Active    Patient not taking:   Discontinued 07/25/21 0905 (Completed Course)   simvastatin (ZOCOR) 20 MG tablet [id number] Yes TAKE 1 TABLET EVERY NIGHT AT BEDTIME. Biagio Borg, MD Taking Active   sitaGLIPtin (JANUVIA) 100 MG tablet [id number] Yes Take 1 tablet (100 mg total) by mouth daily. Biagio Borg, MD Taking Active   tamsulosin [facility]) 0.4 MG CAPS capsule [id number]  Yes Take 0.4 mg by mouth daily. [provider] Taking Active   vitamin B-12 (CYANOCOBALAMIN) 1000 MCG tablet [id number] Yes Take 1,000 mcg by mouth daily. [provider] Taking Active Self  Patient Active Problem List   Diagnosis Date Noted   COVID-19 virus infection 05/16/2021   Allergic rhinitis 05/16/2021   Vitamin D deficiency 12/03/2020   Congestion of nasal sinus 08/24/2020   Dysphagia 06/04/2020   Night sweats 05/30/2020   Hematochezia 05/30/2020   Right foot drop 02/02/2020   Paresthesia of skin 02/02/2020   Status post carpal tunnel release 02/04/2019   Left carpal tunnel syndrome 11/29/2018   Malignant neoplasm of prostate ([place]) 09/03/2018   Vitamin B12 deficiency 02/13/2018   Lumbar radiculopathy 07/09/2016   Radiculopathy due to lumbar intervertebral disc disorder 07/09/2016   Hemorrhoid 08/07/2015   BPH without obstruction/lower urinary tract symptoms 02/01/2015   Essential hypertension 09/12/2014   Encounter for well adult exam with abnormal findings 01/14/2011   ERECTILE DYSFUNCTION 04/04/2008   Hyperlipidemia 06/26/2007   Diabetes ([place]) 06/25/2007   NEPHROLITHIASIS, HX OF 06/25/2007    Immunization History  Administered Date(s) Administered   Fluad Quad(high Dose 65+) 04/06/2020, 04/04/2021   Influenza Split 04/09/2011, 04/13/2012   Influenza Whole 04/04/2008, 04/19/2009, 04/12/2010   Influenza, High Dose Seasonal PF 05/13/2013, 03/14/2016, 03/28/2017, 03/16/2018   Influenza,inj,Quad PF,6+ Mos 04/06/2014   Influenza-Unspecified 05/08/2019   Moderna Sars-Covid-2 Vaccination 09/03/2019, 11/10/2019, 04/18/2020   Pneumococcal Conjugate-13 09/12/2014   Pneumococcal Polysaccharide-23 01/02/2010   Td 01/02/2010   Tdap 11/27/2020   Zoster, Live 01/07/2012    Conditions to be addressed/monitored:  Hypertension, Hyperlipidemia, and Diabetes  Care Plan : [place]  Updates made by Tomasa Blase, RPH since  07/25/2021 12:00 AM     Problem: HTN, HLD, DM2, BPH   Priority: High  Onset Date: 07/25/2021     Long-Range Goal: Disease Management   Start Date: 07/25/2021  Expected End Date: 07/25/2022  This Visit's Progress: On track  Priority: High  Note:   Current Barriers:  Unable to independently monitor therapeutic efficacy  Pharmacist Clinical Goal(s):  Patient will achieve adherence to monitoring guidelines and medication adherence to achieve therapeutic efficacy through collaboration with PharmD and provider.   Interventions: 1:1 collaboration with Biagio Borg, MD regarding development and update of comprehensive plan of care as evidenced by provider attestation and co-signature Inter-disciplinary care team collaboration (see longitudinal plan of care) Comprehensive medication review performed; medication list updated in electronic medical record  Hypertension (BP goal <140/90) -Controlled - per patient has been controlled when checked at home  -Current treatment: Losartan 57m - 1 tablet daily  -Medications previously tried: n/a  -Current home readings: reports to checking once weekly, could not recall latest measurements but notes it has been <140/90 -Current dietary habits: reports to following a low sodium diet  -Current exercise habits: walks 2.5 miles daily  -Denies hypotensive/hypertensive symptoms -Educated on BP goals and benefits of medications for prevention of heart attack, stroke and kidney damage; Daily salt intake goal < 2300 mg; Exercise goal of 150 minutes per week; Importance of home blood pressure monitoring; Proper BP monitoring technique; -Counseled to monitor BP at home once weekly, document, and provide log at future appointments -Counseled on diet and exercise extensively Recommended to continue current medication  Hyperlipidemia: (LDL goal < 70) -Controlled Lab Results  Component Value Date   LDLCALC 50 06/04/2021  -Current treatment: Simvastatin  21mdaily  ASA 8129maily  -Medications previously tried: n/a  -Current dietary patterns: reports to eating a diet that is low in cholesterol -Current exercise habits: walks 2.5 miles daily  -Educated on Cholesterol goals;  Benefits of statin for ASCVD risk  reduction; Importance of limiting foods high in cholesterol; Exercise goal of 150 minutes per week; -Counseled on diet and exercise extensively Recommended to continue current medication  Diabetes (A1c goal <7%) -Controlled Lab Results  Component Value Date   HGBA1C 6.8 (H) 06/04/2021  -Current medications: Januvia 149m daily  Acarbose 554m3 times daily  Glipizide XL 1099maily  -Medications previously tried: metformin, glimepiride, pioglitazone,   -Current home glucose readings fasting glucose: 90-120's  - checking 3 times weekly  -Denies hypoglycemic/hyperglycemic symptoms -Current meal patterns:  breakfast: oatmeal, toast, cereal, apple, water to drink   lunch: varies, typically light, can be left overs from dinner   dinner: protein, trying to increase vegetables, carb snacks: sugar free pudding, fruit drinks: water -Current exercise: walks 2.5 miles daily -Educated on A1c and blood sugar goals; Complications of diabetes including kidney damage, retinal damage, and cardiovascular disease; Exercise goal of 150 minutes per week; Benefits of routine self-monitoring of blood sugar; Carbohydrate counting and/or plate method Counseled to check feet daily and get yearly eye exams -Counseled to check feet daily and get yearly eye exams -Counseled on diet and exercise extensively Recommended to continue current medication -discussed with patient possibility of switching to SGLT2 - explained renal and cardiac benefits, likelihood of being able to reduce number/amount of medications - patient preferred to continue on current medications - A1c controlled  BPH (Goal: Control of Urinary Symptoms) -Controlled -Current treatment   Tamsulosin 0.4mg95mily  Oxybutynin 5mg 41mbedtime  -Medications previously tried: n/a  -Recommended to continue current medication  Lumbar Radiculopathy (Goal: Pain Control ) -Controlled -Current treatment  Gabapentin 300mg 51mmes daily  Meloxicam 15mg d37m  -Medications previously tried: n/a  -Recommended to continue current medication  Health Maintenance -Vaccine gaps: shingrix, COVID booster  -Current therapy:  Vitamin B12 1000mcg d69m  Vitamin D 2000units daily  -Educated on Cost vs benefit of each product must be carefully weighed by individual consumer -Patient is satisfied with current therapy and denies issues -Recommended to continue current medication  Patient Goals/Self-Care Activities Patient will:  - take medications as prescribed as evidenced by patient report and record review check glucose 3 times weekly, document, and provide at future appointments check blood pressure once weekly, document, and provide at future appointments target a minimum of 150 minutes of moderate intensity exercise weekly  Follow Up Plan: Telephone follow up appointment with care management team member scheduled for: 6 months  The patient has been provided with contact information for the care management team and has been advised to call with any health related questions or concerns.        Medication Assistance: [facility] for [facility]. in process.  Anticipated assistance start date 08/01/2021.  See plan of care for additional detail.  Care Gaps: Shingrix  COVID booster   [facility]  [phone] box? Yes Pt endorses 100% compliance  Care Plan and Follow Up Patient Decision:  Patient agrees to Care Plan and Follow-up.  Plan: Telephone follow up appointment with  care management team member scheduled for:  6 months  The patient has been provided with contact information for the care management team and has been advised to call with any health related questions or concerns.   Floris Neuhaus CTomasa Blase Clinical Pharmacist, [place]

## 2021-07-25 NOTE — Telephone Encounter (Signed)
Januvia 100mg  samples left at front desk along with AVS from todays appointment   Januvia 100mg   Lot# [id number] Exp: 01/2023  #28 tablets

## 2021-07-25 NOTE — Patient Instructions (Signed)
Visit Information   Following is a copy of your full care plan:  Care Plan : [place]  Updates made by Tomasa Blase, RPH since 07/25/2021 12:00 AM     Problem: HTN, HLD, DM2, BPH   Priority: High  Onset Date: 07/25/2021     Long-Range Goal: Disease Management   Start Date: 07/25/2021  Expected End Date: 07/25/2022  This Visit's Progress: On track  Priority: High  Note:   Current Barriers:  Unable to independently monitor therapeutic efficacy  Pharmacist Clinical Goal(s):  Patient will achieve adherence to monitoring guidelines and medication adherence to achieve therapeutic efficacy through collaboration with PharmD and provider.   Interventions: 1:1 collaboration with Biagio Borg, MD regarding development and update of comprehensive plan of care as evidenced by provider attestation and co-signature Inter-disciplinary care team collaboration (see longitudinal plan of care) Comprehensive medication review performed; medication list updated in electronic medical record  Hypertension (BP goal <140/90) -Controlled - per patient has been controlled when checked at home  -Current treatment: Losartan 20m - 1 tablet daily  -Medications previously tried: n/a  -Current home readings: reports to checking once weekly, could not recall latest measurements but notes it has been <140/90 -Current dietary habits: reports to following a low sodium diet  -Current exercise habits: walks 2.5 miles daily  -Denies hypotensive/hypertensive symptoms -Educated on BP goals and benefits of medications for prevention of heart attack, stroke and kidney damage; Daily salt intake goal < 2300 mg; Exercise goal of 150 minutes per week; Importance of home blood pressure monitoring; Proper BP monitoring technique; -Counseled to monitor BP at home once weekly, document, and provide log at future appointments -Counseled on diet and exercise extensively Recommended to continue current  medication  Hyperlipidemia: (LDL goal < 70) -Controlled Lab Results  Component Value Date   LDLCALC 50 06/04/2021  -Current treatment: Simvastatin 232mdaily  ASA 8155maily  -Medications previously tried: n/a  -Current dietary patterns: reports to eating a diet that is low in cholesterol -Current exercise habits: walks 2.5 miles daily  -Educated on Cholesterol goals;  Benefits of statin for ASCVD risk reduction; Importance of limiting foods high in cholesterol; Exercise goal of 150 minutes per week; -Counseled on diet and exercise extensively Recommended to continue current medication  Diabetes (A1c goal <7%) -Controlled Lab Results  Component Value Date   HGBA1C 6.8 (H) 06/04/2021  -Current medications: Januvia 100m48mily  Acarbose 50mg6mimes daily  Glipizide XL 10mg 45my  -Medications previously tried: metformin, glimepiride, pioglitazone,   -Current home glucose readings fasting glucose: 90-120's  - checking 3 times weekly  -Denies hypoglycemic/hyperglycemic symptoms -Current meal patterns:  breakfast: oatmeal, toast, cereal, apple, water to drink   lunch: varies, typically light, can be left overs from dinner   dinner: protein, trying to increase vegetables, carb snacks: sugar free pudding, fruit drinks: water -Current exercise: walks 2.5 miles daily -Educated on A1c and blood sugar goals; Complications of diabetes including kidney damage, retinal damage, and cardiovascular disease; Exercise goal of 150 minutes per week; Benefits of routine self-monitoring of blood sugar; Carbohydrate counting and/or plate method Counseled to check feet daily and get yearly eye exams -Counseled to check feet daily and get yearly eye exams -Counseled on diet and exercise extensively Recommended to continue current medication -discussed with patient possibility of switching to SGLT2 - explained renal and cardiac benefits, likelihood of being able to reduce number/amount of  medications - patient preferred to continue on current medications -  A1c controlled  BPH (Goal: Control of Urinary Symptoms) -Controlled -Current treatment  Tamsulosin 0.59m daily  Oxybutynin 578mat bedtime  -Medications previously tried: n/a  -Recommended to continue current medication  Lumbar Radiculopathy (Goal: Pain Control ) -Controlled -Current treatment  Gabapentin 30027m times daily  Meloxicam 61m9mily  -Medications previously tried: n/a  -Recommended to continue current medication  Health Maintenance -Vaccine gaps: shingrix, COVID booster  -Current therapy:  Vitamin B12 1000mc83mily  Vitamin D 2000units daily  -Educated on Cost vs benefit of each product must be carefully weighed by individual consumer -Patient is satisfied with current therapy and denies issues -Recommended to continue current medication  Patient Goals/Self-Care Activities Patient will:  - take medications as prescribed as evidenced by patient report and record review check glucose 3 times weekly, document, and provide at future appointments check blood pressure once weekly, document, and provide at future appointments target a minimum of 150 minutes of moderate intensity exercise weekly  Follow Up Plan: Telephone follow up appointment with care management team member scheduled for: 6 months  The patient has been provided with contact information for the care management team and has been advised to call with any health related questions or concerns.      Consent to CCM Services: Mr. BrindCrequegiven information about Chronic Care Management services including:  CCM service includes personalized support from designated clinical staff supervised by his physician, including individualized plan of care and coordination with other care providers 24/7 contact phone numbers for assistance for urgent and routine care needs. Service will only be billed when office clinical staff spend 20 minutes or  more in a month to coordinate care. Only one practitioner may furnish and bill the service in a calendar month. The patient may stop CCM services at any time (effective at the end of the month) by phone call to the office staff. The patient will be responsible for cost sharing (co-pay) of up to 20% of the service fee (after annual deductible is met).  Patient agreed to services and verbal consent obtained.   Plan: Telephone follow up appointment with care management team member scheduled for:  6 months  The patient has been provided with contact information for the care management team and has been advised to call with any health related questions or concerns.   DanieTomasa BlasermD Clinical Pharmacist, [facility] call the care guide team at [phone] need to cancel or reschedule your appointment.   Print copy of patient instructions, educational materials, and care plan provided in person. (Patient coming into office to pick up samples of januvTonga

## 2021-07-26 ENCOUNTER — Other Ambulatory Visit: Payer: Self-pay | Admitting: Internal Medicine

## 2021-07-26 NOTE — Telephone Encounter (Signed)
Please refill as per office routine med refill policy (all routine meds to be refilled for 3 mo or monthly (per pt preference) up to one year from last visit, then month to month grace period for 3 mo, then further med refills will have to be denied) ? ?

## 2021-08-02 ENCOUNTER — Other Ambulatory Visit: Payer: Self-pay

## 2021-08-02 MED ORDER — ACARBOSE 50 MG PO TABS: 50.0000 mg | ORAL_TABLET | Freq: Three times a day (TID) | ORAL | 1 refills | Status: DC

## 2021-08-07 DIAGNOSIS — N401 Enlarged prostate with lower urinary tract symptoms: Secondary | ICD-10-CM

## 2021-08-07 DIAGNOSIS — E1169 Type 2 diabetes mellitus with other specified complication: Secondary | ICD-10-CM

## 2021-08-07 DIAGNOSIS — Z7984 Long term (current) use of oral hypoglycemic drugs: Secondary | ICD-10-CM

## 2021-08-07 DIAGNOSIS — I10 Essential (primary) hypertension: Secondary | ICD-10-CM

## 2021-08-07 DIAGNOSIS — E785 Hyperlipidemia, unspecified: Secondary | ICD-10-CM | POA: Diagnosis not present

## 2021-09-11 ENCOUNTER — Telehealth: Payer: Self-pay | Admitting: Internal Medicine

## 2021-09-11 MED ORDER — OXYBUTYNIN CHLORIDE 5 MG PO TABS: 5.0000 mg | ORAL_TABLET | Freq: Every day | ORAL | 0 refills | Status: DC

## 2021-09-11 NOTE — Telephone Encounter (Signed)
1.Medication Requested: oxybutynin (DITROPAN) 5 MG tablet ? ?2. Pharmacy (Name, Street, [facility]): [place] ? ?3. On Med List: y ? ?4. Last Visit with PCP: 06-04-2021 ? ?5. Next visit date with PCP: 12-04-2021 ? ? ?Agent: Please be advised that RX refills may take up to 3 business days. We ask that you follow-up with your pharmacy.  ?

## 2021-09-11 NOTE — Telephone Encounter (Signed)
Refill sent to pharmacy.   

## 2021-09-14 ENCOUNTER — Other Ambulatory Visit: Payer: Self-pay | Admitting: Physical Medicine and Rehabilitation

## 2021-09-14 DIAGNOSIS — M961 Postlaminectomy syndrome, not elsewhere classified: Secondary | ICD-10-CM

## 2021-09-14 DIAGNOSIS — M5416 Radiculopathy, lumbar region: Secondary | ICD-10-CM

## 2021-09-14 DIAGNOSIS — M21371 Foot drop, right foot: Secondary | ICD-10-CM

## 2021-09-14 DIAGNOSIS — R202 Paresthesia of skin: Secondary | ICD-10-CM

## 2021-09-19 ENCOUNTER — Other Ambulatory Visit: Payer: Self-pay | Admitting: Physical Medicine and Rehabilitation

## 2021-09-19 DIAGNOSIS — M5416 Radiculopathy, lumbar region: Secondary | ICD-10-CM

## 2021-09-19 DIAGNOSIS — M21371 Foot drop, right foot: Secondary | ICD-10-CM

## 2021-09-19 DIAGNOSIS — R202 Paresthesia of skin: Secondary | ICD-10-CM

## 2021-09-19 DIAGNOSIS — M961 Postlaminectomy syndrome, not elsewhere classified: Secondary | ICD-10-CM

## 2021-09-20 ENCOUNTER — Telehealth: Payer: Self-pay | Admitting: Orthopedic Surgery

## 2021-09-20 NOTE — Telephone Encounter (Signed)
Alex Massey states that he has seen Dr. Ernestina Patches in the past for back pain that radiates into his foot.  Dr. Ernestina Patches told him about a foot/ankle brace at his last appointment.  He is still having problems with the foot and would like to know if he needs to come in for a re-check or can he call and obtain the brace by himself vs. Referral from [name].  Please call [phone]. ?

## 2021-09-26 ENCOUNTER — Telehealth: Payer: Medicare Other

## 2021-09-26 ENCOUNTER — Ambulatory Visit: Payer: Medicare Other | Admitting: Physical Medicine and Rehabilitation

## 2021-09-26 ENCOUNTER — Other Ambulatory Visit: Payer: Self-pay

## 2021-09-26 ENCOUNTER — Encounter: Payer: Self-pay | Admitting: Physical Medicine and Rehabilitation

## 2021-09-26 VITALS — BP 177/92 | HR 58

## 2021-09-26 DIAGNOSIS — M5416 Radiculopathy, lumbar region: Secondary | ICD-10-CM | POA: Diagnosis not present

## 2021-09-26 DIAGNOSIS — M21371 Foot drop, right foot: Secondary | ICD-10-CM

## 2021-09-26 DIAGNOSIS — R202 Paresthesia of skin: Secondary | ICD-10-CM

## 2021-09-26 DIAGNOSIS — M961 Postlaminectomy syndrome, not elsewhere classified: Secondary | ICD-10-CM | POA: Diagnosis not present

## 2021-09-26 MED ORDER — GABAPENTIN 600 MG PO TABS: ORAL_TABLET | ORAL | 2 refills | Status: DC

## 2021-09-26 NOTE — Progress Notes (Signed)
Pt state lower back pain that travels to his right foot. Pt state he has numbness in his right foot. Pt has hx of back surgery. Pt state any movement makes the pain worse. Pt state he takes pain meds and uses braces to help with his pain. ? ?Numeric Pain Rating Scale and Functional Assessment ?Average Pain 9 ?Pain Right Now 7 ?My pain is intermittent, sharp, burning, dull, tingling, and aching ?Pain is worse with: walking, bending, sitting, standing, and some activites ?Pain improves with: medication and injections ? ? ?In the last MONTH (on 0-10 scale) has pain interfered with the following? ? ?1. General activity like being  able to carry out your everyday physical activities such as walking, climbing stairs, carrying groceries, or moving a chair?  ?Rating(6) ? ?2. Relation with others like being able to carry out your usual social activities and roles such as  activities at home, at work and in your community. ?Rating(7) ? ?3. Enjoyment of life such that you have  been bothered by emotional problems such as feeling anxious, depressed or irritable?  ?Rating(8) ? ?

## 2021-09-26 NOTE — Progress Notes (Signed)
Alex Massey - 81 y.o. male MRN [id number]  Date of birth: 10/27/1940  Office Visit Note: Visit Date: 09/26/2021 PCP: Corwin Levins, MD Referred by: Corwin Levins, MD  Subjective: Chief Complaint  Patient presents with   Lower Back - Pain   Right Foot - Pain, Numbness   HPI: Alex Massey is a 81 y.o. male who comes in today for evaluation of chronic bilateral lower back pain radiating down right leg to the foot.  Patient continues to have intermittent paresthesias and numbness to right leg and foot.  Patient's symptoms have been ongoing for several years and worsened after lumbar discectomy, he did have initial right foot weakness post surgery.  Patient reports his pain is exacerbated by movement and activity, currently describes as a dull sore sensation, rates as 4 out of 10.  Patient states he is a very active person and gets some relief of pain with home exercise regimen, walking and use of Tylenol as needed.  Patient's lumbar MRI from 2021 exhibits prior right hemilaminectomy at L4-L5 and L5-S1, L3 compression fracture with slight height loss, no neural impingement, disc herniations or high grade spinal canal stenosis noted. Patient continues to wear right lower extremity AFO as needed and reports he feels this device does assist with ambulation. Overall, patient states he is doing well and continues to be active, states he works in yard and garden frequently and is able to manage pain at home. Patient is requesting refill of Gabapentin today. Patient denies recent trauma or falls.   Review of Systems  Musculoskeletal:  Positive for back pain.  Neurological:  Positive for tingling, sensory change and focal weakness.  All other systems reviewed and are negative. Otherwise per HPI.  Assessment & Plan: Visit Diagnoses:    ICD-10-CM   1. Lumbar radiculopathy  M54.16     2. Post laminectomy syndrome  M96.1     3. Paresthesia of skin  R20.2     4. Right foot drop  M21.371         Plan: Findings:  Chronic bilateral lower back pain radiating down right leg to foot, also continues to have intermittent paraesthesias and numbness to right leg and foot. Patient continues with conservative therapies at home such as exercise regimen and use of Tylenol as needed. Patients clinical presentation and exam are consistent with L5 nerve pattern. We believe the next step is to continue to monitor patient, he is instructed to let us know if his symptoms worsen or change in nature. I did refill Gabapentin today and discussed use of this medication, I will start with low dose at night and taper up each week if tolerated. Patient does not feel his pain is severe at this time and would like to hold on any further lumbar epidural steroid injections at this time. Patient encouraged to remain active and to continue with home exercise regimen. Patient also encouraged to wear AFO as needed. No red flag symptoms noted upon exam today.    Meds & Orders: No orders of the defined types were placed in this encounter.  No orders of the defined types were placed in this encounter.   Follow-up: Return if symptoms worsen or fail to improve.   Procedures: No procedures performed      Clinical History: EXAM: MRI LUMBAR SPINE WITHOUT CONTRAST   TECHNIQUE: Multiplanar, multisequence MR imaging of the lumbar spine was performed. No intravenous contrast was administered.   COMPARISON:  07/17/2014   FINDINGS:  Segmentation:  Standard lumbar numbering   Alignment:  Physiologic   Vertebrae: Horizontal marrow edema with hypointense fracture line at L3. Height loss is minimal. No evidence of underlying lesion. Prominent fatty marrow at the level of the lower lumbar spine and sacrum, chronic and presumably from prostate cancer treatment.   Conus medullaris and cauda equina: Conus extends to the L1 level. Conus and cauda equina appear normal.   Paraspinal and other soft tissues: Scarring at the  level of L4 and L5.   Disc levels:   T12- L1: Unremarkable.   L1-L2: Unremarkable.   L2-L3: Mild disc narrowing and bulging.   L3-L4: Disc narrowing and bulging borderline facet spurring.   L4-L5: Disc narrowing and right eccentric bulging. Prior right-sided laminotomy. The canal and foramina are patent   L5-S1:Greatest level of disc narrowing with bulging and ridging. Negative facets. Noncompressive left foraminal narrowing. Patent spinal canal.   IMPRESSION: 1. L3 compression fracture with slight height loss. 2. Noncompressive disc degeneration as described. Prior right-sided decompression at L4-5.     Electronically Signed   By: Marnee Spring M.D.   On: 02/27/2020 09:12   He reports that he quit smoking about 18 years ago. His smoking use included cigarettes. He has never used smokeless tobacco.  Recent Labs    11/27/20 0914 06/04/21 1237  HGBA1C 7.6* 6.8*    Objective:  VS:  HT:    WT:   BMI:     BP: (!) 177/92  HR: (!) 58bpm  TEMP: ( )  RESP:  Physical Exam Vitals and nursing note reviewed.  HENT:     Head: Normocephalic and atraumatic.     Right Ear: External ear normal.     Left Ear: External ear normal.     Nose: Nose normal.     Mouth/Throat:     Mouth: Mucous membranes are moist.  Eyes:     Extraocular Movements: Extraocular movements intact.  Cardiovascular:     Rate and Rhythm: Normal rate.     Pulses: Normal pulses.  Pulmonary:     Effort: Pulmonary effort is normal.  Abdominal:     General: Abdomen is flat. There is no distension.  Musculoskeletal:        General: Tenderness present.     Cervical back: Normal range of motion.     Comments: Pt is slow to rise from seated position. Pain noted upon facet loading. Weakness noted with dorsiflexion and plantar flexion of the right foot. No pain upon palpation of greater trochanters. Patient wearing AFO to right lower extremity, gait slow.  Skin:    General: Skin is warm and dry.      Capillary Refill: Capillary refill takes less than 2 seconds.  Neurological:     Mental Status: He is alert and oriented to person, place, and time.     Sensory: Sensory deficit present.     Motor: Weakness present.     Gait: Gait abnormal.  Psychiatric:        Mood and Affect: Mood normal.        Behavior: Behavior normal.    Ortho Exam  Imaging: No results found.  Past Medical/Family/Surgical/Social History: Medications & Allergies reviewed per EMR, new medications updated. Patient Active Problem List   Diagnosis Date Noted   COVID-19 virus infection 05/16/2021   Allergic rhinitis 05/16/2021   Vitamin D deficiency 12/03/2020   Congestion of nasal sinus 08/24/2020   Dysphagia 06/04/2020   Night sweats 05/30/2020   Hematochezia 05/30/2020  Right foot drop 02/02/2020   Paresthesia of skin 02/02/2020   Status post carpal tunnel release 02/04/2019   Left carpal tunnel syndrome 11/29/2018   Malignant neoplasm of prostate (HCC) 09/03/2018   Vitamin B12 deficiency 02/13/2018   Lumbar radiculopathy 07/09/2016   Radiculopathy due to lumbar intervertebral disc disorder 07/09/2016   Hemorrhoid 08/07/2015   BPH without obstruction/lower urinary tract symptoms 02/01/2015   Essential hypertension 09/12/2014   Encounter for well adult exam with abnormal findings 01/14/2011   ERECTILE DYSFUNCTION 04/04/2008   Hyperlipidemia 06/26/2007   Diabetes (HCC) 06/25/2007   NEPHROLITHIASIS, HX OF 06/25/2007   Past Medical History:  Diagnosis Date   Acquired right foot drop    post lumbar surgery,  wears brace   Allergy    B12 deficiency    Chronic constipation    diet   ED (erectile dysfunction)    History of kidney stones    Hyperlipidemia    Hyperplasia of prostate with lower urinary tract symptoms (LUTS)    Hypertension    Insomnia    Neuropathy, peripheral    right foot   Presence of surgical incision 01/21/2019   s/p  left carpal tunnel release --  pt has dressing on,  denies  pain or numbness   Prostate cancer [facility]) urologist-- dr eskridge/  oncologist-  dr Kathrynn Running   dx 08-04-2018,  Stage T1c, Gleason 4+4   Renal cyst, right    Type 2 diabetes mellitus (HCC)    followed by pcp   Wears partial dentures    upper and lower   Family History  Problem Relation Age of Onset   Healthy Mother    Diabetes Father    Prostate cancer Brother    Colon cancer Brother 59   Prostate cancer Cousin        maternal   Prostate cancer Nephew    Rectal cancer Neg Hx    Stomach cancer Neg Hx    Past Surgical History:  Procedure Laterality Date   CARPAL TUNNEL RELEASE Bilateral left 01-21-2019;  right 1980s   CATARACT EXTRACTION Left 03/2020   CYSTOSCOPY N/A 01/26/2019   Procedure: CYSTOSCOPY FLEXIBLE;  Surgeon: Jerilee Field, MD;  Location: [facility];  Service: Urology;  Laterality: N/A;  no seeds found in bladder   LUMBAR DISC SURGERY  1984  L4-5;  1998 L4--S1   LUMBAR EPIDURAL INJECTION  2009, 2006   dr Alvester Morin   LUMBAR MICRODISCECTOMY  05-13-2002  dr Criss Alvine  @[facility]    L4-5   RADIOACTIVE SEED IMPLANT N/A 01/26/2019   Procedure: RADIOACTIVE SEED IMPLANT/BRACHYTHERAPY IMPLANT;  Surgeon: Jerilee Field, MD;  Location: [facility];  Service: Urology;  Laterality: N/A;    54 seeds implanted   SPACE OAR INSTILLATION N/A 01/26/2019   Procedure: SPACE OAR INSTILLATION;  Surgeon: Jerilee Field, MD;  Location[facility];  Service: Urology;  Laterality: N/A;   Social History   Occupational History    Comment: retired  Tobacco Use   Smoking status: Former    Years: 20.00    Types: Cigarettes    Quit date: 01/25/2003    Years since quitting: 18.6   Smokeless tobacco: Never  Vaping Use   Vaping Use: Never used  Substance and Sexual Activity   Alcohol use: Yes    Alcohol/week: 3.0 standard drinks    Types: 3 Glasses of wine per week   Drug use: Never   Sexual activity: Not on file

## 2021-10-04 ENCOUNTER — Telehealth: Payer: Self-pay

## 2021-10-04 DIAGNOSIS — Z8546 Personal history of malignant neoplasm of prostate: Secondary | ICD-10-CM | POA: Diagnosis not present

## 2021-10-04 MED ORDER — LOSARTAN POTASSIUM 50 MG PO TABS: 50.0000 mg | ORAL_TABLET | Freq: Two times a day (BID) | ORAL | 2 refills | Status: DC

## 2021-10-04 NOTE — Telephone Encounter (Signed)
Ok this is done erx 

## 2021-10-04 NOTE — Telephone Encounter (Signed)
? ?  Hypertension Outreach Note  ?Pharmacy Note ? ?Spoke with patient and his wife  ? ?Hypertension (BP goal <140/90) ?-Not Ideally Controlled, has been elevated with most recent office visits  ?BP Readings from Last 3 Encounters:  ?09/26/21 (!) 177/92  ?06/04/21 (!) 152/80  ?03/27/21 119/71  ?-Current treatment: ?Losartan '50mg'$  - 1 tablet daily  ?-Medications previously tried: n/a  ?-Current home readings: not currently checking  ?-Current dietary habits: reports to following a low sodium diet  ?-Current exercise habits: walks 2.5 miles daily  ?-Denies hypotensive/hypertensive symptoms ?-Educated on BP goals and benefits of medications for prevention of heart attack, stroke and kidney damage; ?Daily salt intake goal < 2300 mg; ?Exercise goal of 150 minutes per week; ?Importance of home blood pressure monitoring; ?Proper BP monitoring technique; ?-Counseled to monitor BP at home once weekly, document, and provide log at future appointments ?-Counseled on diet and exercise extensively ?Recommended to increase losartan to '50mg'$  BID  ?-Patient and wife agreeable to start checking blood pressure 2-3 times weekly ?-Reviewed with patient and wife BP goals / signs and symptoms of hypotension ?-Patient will reach out to office should he have any issues  ? ?Tomasa Blase, PharmD ?Clinical Pharmacist, [place]  ?

## 2021-10-04 NOTE — Addendum Note (Signed)
Addended by: Biagio Borg on: 10/04/2021 05:38 PM ? ? Modules accepted: Orders ? ?

## 2021-10-11 DIAGNOSIS — R351 Nocturia: Secondary | ICD-10-CM | POA: Diagnosis not present

## 2021-10-11 DIAGNOSIS — N401 Enlarged prostate with lower urinary tract symptoms: Secondary | ICD-10-CM | POA: Diagnosis not present

## 2021-10-11 DIAGNOSIS — Z8546 Personal history of malignant neoplasm of prostate: Secondary | ICD-10-CM | POA: Diagnosis not present

## 2021-10-28 ENCOUNTER — Other Ambulatory Visit: Payer: Self-pay | Admitting: Internal Medicine

## 2021-10-28 NOTE — Telephone Encounter (Signed)
Please refill as per office routine med refill policy (all routine meds to be refilled for 3 mo or monthly (per pt preference) up to one year from last visit, then month to month grace period for 3 mo, then further med refills will have to be denied) ? ?

## 2021-10-30 ENCOUNTER — Ambulatory Visit: Payer: Medicare Other

## 2021-10-30 NOTE — Progress Notes (Signed)
? ?  Hypertension Outreach Note  ?Pharmacy Note ? ?Spoke with patient and his wife, notes that since he has increased losartan to '50mg'$  twice daily, BP has been controlled at home, denies any issues since increase in medication  ? ?Hypertension (BP goal <140/90) ?-Controlled - per patient has been controlled when checked at home  ?-Current treatment: ?Losartan '50mg'$  - 1 tablet twice daily   ?-Medications previously tried: n/a  ?-Current home readings: reports to checking once weekly, could not recall latest measurements but notes it has been <140/90 ?-Current dietary habits: reports to following a low sodium diet  ?-Current exercise habits: walks 2.5 miles daily  ?-Denies hypotensive/hypertensive symptoms ?-Educated on BP goals and benefits of medications for prevention of heart attack, stroke and kidney damage; ?Daily salt intake goal < 2300 mg; ?Exercise goal of 150 minutes per week; ?Importance of home blood pressure monitoring; ?Proper BP monitoring technique; ?-Counseled to monitor BP at home once weekly, document, and provide log at future appointments ?-Counseled on diet and exercise extensively ?Recommended to continue current medication ? ?Tomasa Blase, PharmD ?Clinical Pharmacist, [place]  ?

## 2021-11-01 ENCOUNTER — Telehealth: Payer: Self-pay | Admitting: Physical Medicine and Rehabilitation

## 2021-11-01 NOTE — Telephone Encounter (Signed)
Pt called requesting to set an apt. Please call pt at [phone] ?

## 2021-11-07 ENCOUNTER — Telehealth: Payer: Self-pay | Admitting: Physical Medicine and Rehabilitation

## 2021-11-07 NOTE — Telephone Encounter (Signed)
Pt called and would like to speak with someone about the injection before scheduling it.  ? ?CB [phone]  ?

## 2021-11-12 ENCOUNTER — Ambulatory Visit: Payer: Medicare Other | Admitting: Physical Medicine and Rehabilitation

## 2021-12-04 ENCOUNTER — Encounter: Payer: Self-pay | Admitting: Internal Medicine

## 2021-12-04 ENCOUNTER — Ambulatory Visit (INDEPENDENT_AMBULATORY_CARE_PROVIDER_SITE_OTHER): Payer: Medicare Other | Admitting: Internal Medicine

## 2021-12-04 VITALS — BP 132/70 | HR 65 | Temp 97.8°F | Ht 66.0 in | Wt 159.0 lb

## 2021-12-04 DIAGNOSIS — E782 Mixed hyperlipidemia: Secondary | ICD-10-CM | POA: Diagnosis not present

## 2021-12-04 DIAGNOSIS — E559 Vitamin D deficiency, unspecified: Secondary | ICD-10-CM

## 2021-12-04 DIAGNOSIS — I10 Essential (primary) hypertension: Secondary | ICD-10-CM | POA: Diagnosis not present

## 2021-12-04 DIAGNOSIS — E1165 Type 2 diabetes mellitus with hyperglycemia: Secondary | ICD-10-CM

## 2021-12-04 DIAGNOSIS — E538 Deficiency of other specified B group vitamins: Secondary | ICD-10-CM | POA: Diagnosis not present

## 2021-12-04 DIAGNOSIS — Z0001 Encounter for general adult medical examination with abnormal findings: Secondary | ICD-10-CM

## 2021-12-04 LAB — URINALYSIS, ROUTINE W REFLEX MICROSCOPIC
Bilirubin Urine: NEGATIVE
Hgb urine dipstick: NEGATIVE
Ketones, ur: NEGATIVE
Leukocytes,Ua: NEGATIVE
Nitrite: NEGATIVE
RBC / HPF: NONE SEEN (ref 0–?)
Specific Gravity, Urine: 1.02 (ref 1.000–1.030)
Total Protein, Urine: NEGATIVE
Urine Glucose: NEGATIVE
Urobilinogen, UA: 0.2 (ref 0.0–1.0)
WBC, UA: NONE SEEN (ref 0–?)
pH: 6 (ref 5.0–8.0)

## 2021-12-04 LAB — BASIC METABOLIC PANEL
BUN: 14 mg/dL (ref 6–23)
CO2: 27 mEq/L (ref 19–32)
Calcium: 10.2 mg/dL (ref 8.4–10.5)
Chloride: 101 mEq/L (ref 96–112)
Creatinine, Ser: 0.89 mg/dL (ref 0.40–1.50)
GFR: 81.29 mL/min (ref >60.00)
Glucose, Bld: 143 mg/dL — ABNORMAL HIGH (ref 70–99)
Potassium: 4.3 mEq/L (ref 3.5–5.1)
Sodium: 138 mEq/L (ref 135–145)

## 2021-12-04 LAB — LIPID PANEL
Cholesterol: 150 mg/dL (ref 0–200)
HDL: 77.6 mg/dL (ref >39.00)
LDL Cholesterol: 46 mg/dL (ref 0–99)
NonHDL: 72.81
Total CHOL/HDL Ratio: 2
Triglycerides: 133 mg/dL (ref 0.0–149.0)
VLDL: 26.6 mg/dL (ref 0.0–40.0)

## 2021-12-04 LAB — HEPATIC FUNCTION PANEL
ALT: 16 U/L (ref 0–53)
AST: 16 U/L (ref 0–37)
Albumin: 4.7 g/dL (ref 3.5–5.2)
Alkaline Phosphatase: 72 U/L (ref 39–117)
Bilirubin, Direct: 0.2 mg/dL (ref 0.0–0.3)
Total Bilirubin: 0.9 mg/dL (ref 0.2–1.2)
Total Protein: 7.3 g/dL (ref 6.0–8.3)

## 2021-12-04 LAB — CBC WITH DIFFERENTIAL/PLATELET
Basophils Absolute: 0 10*3/uL (ref 0.0–0.1)
Basophils Relative: 1.1 % (ref 0.0–3.0)
Eosinophils Absolute: 0.1 10*3/uL (ref 0.0–0.7)
Eosinophils Relative: 2.7 % (ref 0.0–5.0)
HCT: 38.3 % — ABNORMAL LOW (ref 39.0–52.0)
Hemoglobin: 12.3 g/dL — ABNORMAL LOW (ref 13.0–17.0)
Lymphocytes Relative: 24.5 % (ref 12.0–46.0)
Lymphs Abs: 1 10*3/uL (ref 0.7–4.0)
MCHC: 32.1 g/dL (ref 30.0–36.0)
MCV: 80.7 fl (ref 78.0–100.0)
Monocytes Absolute: 0.5 10*3/uL (ref 0.1–1.0)
Monocytes Relative: 12.6 % — ABNORMAL HIGH (ref 3.0–12.0)
Neutro Abs: 2.5 10*3/uL (ref 1.4–7.7)
Neutrophils Relative %: 59.1 % (ref 43.0–77.0)
Platelets: 127 10*3/uL — ABNORMAL LOW (ref 150.0–400.0)
RBC: 4.75 Mil/uL (ref 4.22–5.81)
RDW: 14.7 % (ref 11.5–15.5)
WBC: 4.1 10*3/uL (ref 4.0–10.5)

## 2021-12-04 LAB — MICROALBUMIN / CREATININE URINE RATIO
Creatinine,U: 102.7 mg/dL
Microalb Creat Ratio: 1.4 mg/g (ref 0.0–30.0)
Microalb, Ur: 1.4 mg/dL (ref 0.0–1.9)

## 2021-12-04 LAB — VITAMIN B12: Vitamin B-12: 952 pg/mL — ABNORMAL HIGH (ref 211–911)

## 2021-12-04 LAB — HEMOGLOBIN A1C: Hgb A1c MFr Bld: 6.4 % (ref 4.6–6.5)

## 2021-12-04 LAB — VITAMIN D 25 HYDROXY (VIT D DEFICIENCY, FRACTURES): VITD: 42.32 ng/mL (ref 30.00–100.00)

## 2021-12-04 LAB — TSH: TSH: 1.59 u[IU]/mL (ref 0.35–5.50)

## 2021-12-04 MED ORDER — AMLODIPINE BESYLATE 5 MG PO TABS: 5.0000 mg | ORAL_TABLET | Freq: Every day | ORAL | 3 refills | Status: DC

## 2021-12-04 NOTE — Assessment & Plan Note (Signed)
Last vitamin D Lab Results  Component Value Date   VD25OH 23.06 (L) 11/27/2020   Low, to start oral replacement

## 2021-12-04 NOTE — Assessment & Plan Note (Signed)
Age and sex appropriate education and counseling updated with regular exercise and diet °Referrals for preventative services - none needed °Immunizations addressed - declines shingrix and covid booster °Smoking counseling  - none needed °Evidence for depression or other mood disorder - none significant °Most recent labs reviewed. °I have personally reviewed and have noted: °1) the patient's medical and social history °2) The patient's current medications and supplements °3) The patient's height, weight, and BMI have been recorded in the chart ° °

## 2021-12-04 NOTE — Assessment & Plan Note (Signed)
BP Readings from Last 3 Encounters:  12/04/21 132/70  10/30/21 131/71  09/26/21 (!) 177/92   Mild Uncontrolled at home, pt to continue medical treatment losartan 50 bid and add amlodipine 5 qd

## 2021-12-04 NOTE — Progress Notes (Signed)
Patient ID: Alex Massey, male   DOB: Oct 06, 1940, 81 y.o.   MRN: [id number]         Chief Complaint:: wellness exam and low vit d , DM, htn, hld       HPI:  Alex Massey is a 81 y.o. male here for wellness exam; declines covid booster and shingrix, o/w up to date                        Also taking low dose vit d.  Pt denies chest pain, increased sob or doe, wheezing, orthopnea, PND, increased LE swelling, palpitations, dizziness or syncope.   Pt denies polydipsia, polyuria, or new focal neuro s/s.    Pt denies fever, wt loss, night sweats, loss of appetite, or other constitutional symptoms  Bp at home has been consisitently mildly elevated with sbp 140s, dbp 90s   Wt Readings from Last 3 Encounters:  12/04/21 159 lb (72.1 kg)  06/04/21 160 lb (72.6 kg)  03/27/21 158 lb (71.7 kg)   BP Readings from Last 3 Encounters:  12/04/21 132/70  10/30/21 131/71  09/26/21 (!) 177/92   Immunization History  Administered Date(s) Administered   Fluad Quad(high Dose 65+) 04/06/2020, 04/04/2021   Influenza Split 04/09/2011, 04/13/2012   Influenza Whole 04/04/2008, 04/19/2009, 04/12/2010   Influenza, High Dose Seasonal PF 05/13/2013, 03/14/2016, 03/28/2017, 03/16/2018   Influenza,inj,Quad PF,6+ Mos 04/06/2014   Influenza-Unspecified 05/08/2019   Moderna Sars-Covid-2 Vaccination 09/03/2019, 11/10/2019, 04/18/2020   Pneumococcal Conjugate-13 09/12/2014   Pneumococcal Polysaccharide-23 01/02/2010   Td 01/02/2010   Tdap 11/27/2020   Zoster, Live 01/07/2012   Health Maintenance Due  Topic Date Due   HEMOGLOBIN A1C  12/02/2021      Past Medical History:  Diagnosis Date   Acquired right foot drop    post lumbar surgery,  wears brace   Allergy    B12 deficiency    Chronic constipation    diet   ED (erectile dysfunction)    History of kidney stones    Hyperlipidemia    Hyperplasia of prostate with lower urinary tract symptoms (LUTS)    Hypertension    Insomnia    Neuropathy,  peripheral    right foot   Presence of surgical incision 01/21/2019   s/p  left carpal tunnel release --  pt has dressing on,  denies pain or numbness   Prostate cancer [facility]) urologist-- dr eskridge/  oncologist-  dr Tammi Klippel   dx 08-04-2018,  Stage T1c, Gleason 4+4   Renal cyst, right    Type 2 diabetes mellitus ([place])    followed by pcp   Wears partial dentures    upper and lower   Past Surgical History:  Procedure Laterality Date   CARPAL TUNNEL RELEASE Bilateral left 01-21-2019;  right 1980s   CATARACT EXTRACTION Left 03/2020   CYSTOSCOPY N/A 01/26/2019   Procedure: CYSTOSCOPY FLEXIBLE;  Surgeon: Festus Aloe, MD;  Location: [facility];  Service: Urology;  Laterality: N/A;  no seeds found in bladder   [place]  L4-5;  1998 L4--S1   LUMBAR EPIDURAL INJECTION  2009, 2006   dr Ernestina Patches   LUMBAR MICRODISCECTOMY  05-13-2002  dr Jenean Lindau  _0    L4-5   RADIOACTIVE SEED IMPLANT N/A 01/26/2019   Procedure: RADIOACTIVE SEED IMPLANT/BRACHYTHERAPY IMPLANT;  Surgeon: Festus Aloe, MD;  Location: [facility];  Service: Urology;  Laterality: N/A;    54 seeds implanted   SPACE  OAR INSTILLATION N/A 01/26/2019   Procedure: SPACE OAR INSTILLATION;  Surgeon: Festus Aloe, MD;  Location: [facility];  Service: Urology;  Laterality: N/A;    reports that he quit smoking about 18 years ago. His smoking use included cigarettes. He has never used smokeless tobacco. He reports current alcohol use of about 3.0 standard drinks per week. He reports that he does not use drugs. family history includes Colon cancer (age of onset: 42) in his brother; Diabetes in his father; Healthy in his mother; Prostate cancer in his brother, cousin, and nephew. Allergies  Allergen Reactions   Metformin And Related Other (See Comments)    GI upset, diarrhea, wt loss   Current Outpatient Medications on File Prior to Visit  Medication Sig Dispense Refill    acarbose (PRECOSE) 50 MG tablet Take 1 tablet (50 mg total) by mouth 3 (three) times daily with meals. 270 tablet 1   aspirin EC 81 MG tablet Take 1 tablet (81 mg total) by mouth daily. 150 tablet 2   blood glucose meter kit and supplies KIT Inject 1 each into the skin 2 (two) times daily. Dispense based on patient and insurance preference. Use up to four times daily as directed. (FOR ICD-9 250.00, 250.01). 1 each 0   Blood Glucose Monitoring Suppl (FREESTYLE LITE) DEVI USE TO CHECK GLUCOSE UP TO 4 TIMES DAILY AS DIRECTED     Cholecalciferol (VITAMIN D) 50 MCG (2000 UT) CAPS Take 1 capsule by mouth daily.     gabapentin (NEURONTIN) 600 MG tablet Take 0.5 tablet (300 mg) at night for 1 week, then take 0.5 tablet (300 mg) morning and night for 1 week, then take 1 tablet (600 mg) twice a day. 120 tablet 2   glipiZIDE (GLUCOTROL XL) 10 MG 24 hr tablet TAKE 1 TABLET(10 MG) BY MOUTH DAILY WITH BREAKFAST 90 tablet 3   glucose blood (FREESTYLE TEST STRIPS) test strip Use to check blood sugars twice a day 180 each 1   Lancets (FREESTYLE) lancets USE TO CHECK BLOOD SUGARS TWICE DAILY. DX: E11.9 200 each 0   losartan (COZAAR) 50 MG tablet TAKE 1 TABLET(50 MG) BY MOUTH DAILY (Patient taking differently: Take 50 mg by mouth in the morning and at bedtime.) 90 tablet 1   meloxicam (MOBIC) 15 MG tablet TAKE 1 TABLET(15 MG) BY MOUTH DAILY 30 tablet 3   oxybutynin (DITROPAN) 5 MG tablet Take 1 tablet (5 mg total) by mouth at bedtime. 90 tablet 0   simvastatin (ZOCOR) 20 MG tablet TAKE 1 TABLET BY MOUTH EVERY NIGHT AT BEDTIME 90 tablet 1   sitaGLIPtin (JANUVIA) 100 MG tablet Take 1 tablet (100 mg total) by mouth daily. 90 tablet 3   tamsulosin (FLOMAX) 0.4 MG CAPS capsule Take 0.4 mg by mouth daily.     vitamin B-12 (CYANOCOBALAMIN) 1000 MCG tablet Take 1,000 mcg by mouth daily.     No current facility-administered medications on file prior to visit.        ROS:  All others reviewed and negative.  Objective         PE:  BP 132/70 (BP Location: Left Arm, Patient Position: Sitting, Cuff Size: Large)   Pulse 65   Temp 97.8 F (36.6 C) (Oral)   Ht _0  (1.676 m)   Wt 159 lb (72.1 kg)   SpO2 98%   BMI 25.66 kg/m                 Constitutional: Pt appears in  NAD               HENT: Head: NCAT.                Right Ear: External ear normal.                 Left Ear: External ear normal.                Eyes: . Pupils are equal, round, and reactive to light. Conjunctivae and EOM are normal               Nose: without d/c or deformity               Neck: Neck supple. Gross normal ROM               Cardiovascular: Normal rate and regular rhythm.                 Pulmonary/Chest: Effort normal and breath sounds without rales or wheezing.                Abd:  Soft, NT, ND, + BS, no organomegaly               Neurological: Pt is alert. At baseline orientation, motor grossly intact               Skin: Skin is warm. No rashes, no other new lesions, LE edema - none               Psychiatric: Pt behavior is normal without agitation   Micro: none  Cardiac tracings I have personally interpreted today:  none  Pertinent Radiological findings (summarize): none   Lab Results  Component Value Date   WBC 3.6 (L) 11/27/2020   HGB 11.7 (L) 11/27/2020   HCT 36.0 (L) 11/27/2020   PLT 123.0 (L) 11/27/2020   GLUCOSE 102 (H) 06/04/2021   CHOL 159 06/04/2021   TRIG 113.0 06/04/2021   HDL 85.60 06/04/2021   LDLCALC 50 06/04/2021   ALT 14 06/04/2021   AST 15 06/04/2021   NA 140 06/04/2021   K 3.9 06/04/2021   CL 101 06/04/2021   CREATININE 0.82 06/04/2021   BUN 12 06/04/2021   CO2 29 06/04/2021   TSH 1.48 11/27/2020   PSA 0.01 (L) 11/27/2020   INR 1.0 01/22/2019   HGBA1C 6.8 (H) 06/04/2021   MICROALBUR 1.5 11/27/2020   Assessment/Plan:  JOHM PFANNENSTIEL is a 81 y.o. Black or African American [2] male with  has a past medical history of Acquired right foot drop, Allergy, B12 deficiency, Chronic  constipation, ED (erectile dysfunction), History of kidney stones, Hyperlipidemia, Hyperplasia of prostate with lower urinary tract symptoms (LUTS), Hypertension, Insomnia, Neuropathy, peripheral, Presence of surgical incision (01/21/2019), Prostate cancer [facility]) (urologist-- dr eskridge/  oncologist-  dr Tammi Klippel), Renal cyst, right, Type 2 diabetes mellitus ([place]), and Wears partial dentures.  Vitamin D deficiency Last vitamin D Lab Results  Component Value Date   VD25OH 23.06 (L) 11/27/2020   Low, to start oral replacement   Encounter for well adult exam with abnormal findings Age and sex appropriate education and counseling updated with regular exercise and diet Referrals for preventative services - none needed Immunizations addressed - declines shingrix and covid booster Smoking counseling  - none needed Evidence for depression or other mood disorder - none significant Most recent labs reviewed. I have personally reviewed and have noted: 1) the patient's medical and social history 2) The  patient's current medications and supplements 3) The patient's height, weight, and BMI have been recorded in the chart   Hyperlipidemia Lab Results  Component Value Date   LDLCALC 50 06/04/2021   Stable, pt to continue current statin zocor 20   Essential hypertension BP Readings from Last 3 Encounters:  12/04/21 132/70  10/30/21 131/71  09/26/21 (!) 177/92   Mild Uncontrolled at home, pt to continue medical treatment losartan 50 bid and add amlodipine 5 qd   Diabetes ([place]) Lab Results  Component Value Date   HGBA1C 6.8 (H) 06/04/2021   Stable, pt to continue current medical treatment januvia, precose, metformin and f/u a1c today  Followup: Return in about 6 months (around 06/06/2022).  Cathlean Cower, MD 12/04/2021 11:54 AM [facility]

## 2021-12-04 NOTE — Assessment & Plan Note (Signed)
Lab Results  Component Value Date   LDLCALC 50 06/04/2021   Stable, pt to continue current statin zocor 20

## 2021-12-04 NOTE — Patient Instructions (Signed)
Please take all new medication as prescribed - the new BP medicine called amlodipine 5 mg per day  Please continue all other medications as before, and refills have been done if requested.  Please have the pharmacy call with any other refills you may need.  Please continue your efforts at being more active, low cholesterol diet, and weight control.  You are otherwise up to date with prevention measures today.  Please keep your appointments with your specialists as you may have planned  Please go to the LAB at the blood drawing area for the tests to be done  You will be contacted by phone if any changes need to be made immediately.  Otherwise, you will receive a letter about your results with an explanation, but please check with MyChart first.  Please remember to sign up for MyChart if you have not done so, as this will be important to you in the future with finding out test results, communicating by private email, and scheduling acute appointments online when needed.  Please make an Appointment to return in 6 months, or sooner if needed, also with Lab Appointment for testing done 3-5 days before at the [place] (so this is for TWO appointments - please see the scheduling desk as you leave)  Due to the ongoing Covid 19 pandemic, our lab now requires an appointment for any labs done at our office.  If you need labs done and do not have an appointment, please call our office ahead of time to schedule before presenting to the lab for your testing.

## 2021-12-04 NOTE — Assessment & Plan Note (Signed)
Lab Results  Component Value Date   HGBA1C 6.8 (H) 06/04/2021   Stable, pt to continue current medical treatment januvia, precose, metformin and f/u a1c today

## 2021-12-05 NOTE — Progress Notes (Signed)
Printed labs results mailed to pt address.Marland Kitchenlb

## 2021-12-10 ENCOUNTER — Encounter: Payer: Self-pay | Admitting: Physical Medicine and Rehabilitation

## 2021-12-10 ENCOUNTER — Ambulatory Visit: Payer: Self-pay

## 2021-12-10 ENCOUNTER — Ambulatory Visit: Payer: Medicare Other | Admitting: Physical Medicine and Rehabilitation

## 2021-12-10 VITALS — BP 155/75 | HR 78

## 2021-12-10 DIAGNOSIS — M5416 Radiculopathy, lumbar region: Secondary | ICD-10-CM

## 2021-12-10 MED ORDER — METHYLPREDNISOLONE ACETATE 80 MG/ML IJ SUSP
80.0000 mg | Freq: Once | INTRAMUSCULAR | Status: AC
  Administered 2021-12-10: 80 mg

## 2021-12-10 NOTE — Progress Notes (Signed)
Alex Massey - 81 y.o. male MRN [id number]  Date of birth: [birth year]  Office Visit Note: Visit Date: 12/10/2021 PCP: Biagio Borg, MD Referred by: Biagio Borg, MD  Subjective: Chief Complaint  Patient presents with   Lower Back - Pain   Right Leg - Pain   Right Foot - Pain   HPI:  Alex Massey is a 81 y.o. male who comes in today for planned repeat Right L5-S1  Lumbar Transforaminal epidural steroid injection with fluoroscopic guidance.  The patient has failed conservative care including home exercise, medications, time and activity modification.  This injection will be diagnostic and hopefully therapeutic.  Please see requesting physician notes for further details and justification.  He is well-known to me and has a confusing story with prior lumbar laminectomy decompression at L4-5 with history of foot drop.  We have counseled him on many occasions concerning that and the fact that we updated MRI on him fairly recently not showing any real nerve compression this is likely nerve injury from the prior spine problem and/or surgery.  Injections are likely not to help and strength wise and we have talked about that.  He continues to wear an AFO at times.  Last injection did not seem to help that much although it did help for a little while and he is pretty adamant that he wants a repeat injection.  We discussed the use of injections today as well as medications etc.  We will go ahead today and repeat a right L5 transforaminal injection.  Please note that last procedure was stated to be a left injection but was indeed a right injection.   Referring: Dr. Jean Rosenthal and Dondra Prader, FNP    ROS Otherwise per HPI.  Assessment & Plan: Visit Diagnoses:    ICD-10-CM   1. Lumbar radiculopathy  M54.16 XR C-ARM NO REPORT    Epidural Steroid injection    methylPREDNISolone acetate (DEPO-MEDROL) injection 80 mg      Plan: No additional findings.   Meds & Orders:  Meds  ordered this encounter  Medications   methylPREDNISolone acetate (DEPO-MEDROL) injection 80 mg    Orders Placed This Encounter  Procedures   XR C-ARM NO REPORT   Epidural Steroid injection    Follow-up: Return for visit to requesting provider as needed.   Procedures: No procedures performed  Lumbosacral Transforaminal Epidural Steroid Injection - Sub-Pedicular Approach with Fluoroscopic Guidance  Patient: Alex Massey      Date of Birth: [birth year] MRN: [id number] PCP: Biagio Borg, MD      Visit Date: 12/10/2021   Universal Protocol:    Date/Time: 12/10/2021  Consent Given By: the patient  Position: PRONE  Additional Comments: Vital signs were monitored before and after the procedure. Patient was prepped and draped in the usual sterile fashion. The correct patient, procedure, and site was verified.   Injection Procedure Details:   Procedure diagnoses: Lumbar radiculopathy [M54.16]    Meds Administered:  Meds ordered this encounter  Medications   methylPREDNISolone acetate (DEPO-MEDROL) injection 80 mg    Laterality: Right  Location/Site: L5  Needle:5.0 in., 22 ga.  Short bevel or Quincke spinal needle  Needle Placement: Transforaminal  Findings:    -Comments: Excellent flow of contrast along the nerve, nerve root and into the epidural space.  Procedure Details: After squaring off the end-plates to get a true AP view, the C-arm was positioned so that an oblique view of the foramen as noted above  was visualized. The target area is just inferior to the "nose of the scotty dog" or sub pedicular. The soft tissues overlying this structure were infiltrated with 2-3 ml. of 1% Lidocaine without Epinephrine.  The spinal needle was inserted toward the target using a "trajectory" view along the fluoroscope beam.  Under AP and lateral visualization, the needle was advanced so it did not puncture dura and was located close the 6 O'Clock position of the pedical in AP  tracterory. Biplanar projections were used to confirm position. Aspiration was confirmed to be negative for CSF and/or blood. A 1-2 ml. volume of Isovue-250 was injected and flow of contrast was noted at each level. Radiographs were obtained for documentation purposes.   After attaining the desired flow of contrast documented above, a 0.5 to 1.0 ml test dose of 0.25% Marcaine was injected into each respective transforaminal space.  The patient was observed for 90 seconds post injection.  After no sensory deficits were reported, and normal lower extremity motor function was noted,   the above injectate was administered so that equal amounts of the injectate were placed at each foramen (level) into the transforaminal epidural space.   Additional Comments:  The patient tolerated the procedure well Dressing: 2 x 2 sterile gauze and Band-Aid    Post-procedure details: Patient was observed during the procedure. Post-procedure instructions were reviewed.  Patient left the clinic in stable condition.    Clinical History: EXAM: MRI LUMBAR SPINE WITHOUT CONTRAST   TECHNIQUE: Multiplanar, multisequence MR imaging of the lumbar spine was performed. No intravenous contrast was administered.   COMPARISON:  07/17/2014   FINDINGS: Segmentation:  Standard lumbar numbering   Alignment:  Physiologic   Vertebrae: Horizontal marrow edema with hypointense fracture line at L3. Height loss is minimal. No evidence of underlying lesion. Prominent fatty marrow at the level of the lower lumbar spine and sacrum, chronic and presumably from prostate cancer treatment.   Conus medullaris and cauda equina: Conus extends to the L1 level. Conus and cauda equina appear normal.   Paraspinal and other soft tissues: Scarring at the level of L4 and L5.   Disc levels:   T12- L1: Unremarkable.   L1-L2: Unremarkable.   L2-L3: Mild disc narrowing and bulging.   L3-L4: Disc narrowing and bulging borderline  facet spurring.   L4-L5: Disc narrowing and right eccentric bulging. Prior right-sided laminotomy. The canal and foramina are patent   L5-S1:Greatest level of disc narrowing with bulging and ridging. Negative facets. Noncompressive left foraminal narrowing. Patent spinal canal.   IMPRESSION: 1. L3 compression fracture with slight height loss. 2. Noncompressive disc degeneration as described. Prior right-sided decompression at L4-5.     Electronically Signed   By: Monte Fantasia M.D.   On: 02/27/2020 09:12     Objective:  VS:  HT:    WT:   BMI:     BP:(!) 155/75  HR:78bpm  TEMP: ( )  RESP:  Physical Exam Vitals and nursing note reviewed.  Constitutional:      General: He is not in acute distress.    Appearance: Normal appearance. He is not ill-appearing.  HENT:     Head: Normocephalic and atraumatic.     Right Ear: External ear normal.     Left Ear: External ear normal.     Nose: No congestion.  Eyes:     Extraocular Movements: Extraocular movements intact.  Cardiovascular:     Rate and Rhythm: Normal rate.     Pulses: Normal pulses.  Pulmonary:     Effort: Pulmonary effort is normal. No respiratory distress.  Abdominal:     General: There is no distension.     Palpations: Abdomen is soft.  Musculoskeletal:        General: No tenderness or signs of injury.     Cervical back: Neck supple.     Right lower leg: No edema.     Left lower leg: No edema.     Comments: Patient has some weakness of dorsiflexion on the right with good plantarflexion.  No clonus.  No pain with hip rotation.  Skin:    Findings: No erythema or rash.  Neurological:     General: No focal deficit present.     Mental Status: He is alert and oriented to person, place, and time.     Sensory: No sensory deficit.     Motor: No weakness or abnormal muscle tone.     Coordination: Coordination normal.  Psychiatric:        Mood and Affect: Mood normal.        Behavior: Behavior normal.       Imaging: No results found.

## 2021-12-10 NOTE — Patient Instructions (Signed)

## 2021-12-10 NOTE — Progress Notes (Signed)
Pt state lower back pain that travels down his right leg and foot. Pt state laying down, walking and standing makes the pain worse. Pt state he takes over the counter pain meds to help ease his pain.  Numeric Pain Rating Scale and Functional Assessment Average Pain 8   In the last MONTH (on 0-10 scale) has pain interfered with the following?  1. General activity like being  able to carry out your everyday physical activities such as walking, climbing stairs, carrying groceries, or moving a chair?  Rating(8)   +Driver, -BT, -Dye Allergies.

## 2021-12-24 ENCOUNTER — Other Ambulatory Visit: Payer: Self-pay | Admitting: Internal Medicine

## 2021-12-25 ENCOUNTER — Telehealth: Payer: Self-pay | Admitting: Physical Medicine and Rehabilitation

## 2021-12-25 NOTE — Telephone Encounter (Signed)
Pt called and would like to repeat right L5 tf esi. Last injection was 06/05

## 2021-12-25 NOTE — Procedures (Signed)
Lumbosacral Transforaminal Epidural Steroid Injection - Sub-Pedicular Approach with Fluoroscopic Guidance  Patient: Alex Massey      Date of Birth: Feb 05, 1941 MRN: [id number] PCP: Biagio Borg, MD      Visit Date: 12/10/2021   Universal Protocol:    Date/Time: 12/10/2021  Consent Given By: the patient  Position: PRONE  Additional Comments: Vital signs were monitored before and after the procedure. Patient was prepped and draped in the usual sterile fashion. The correct patient, procedure, and site was verified.   Injection Procedure Details:   Procedure diagnoses: Lumbar radiculopathy [M54.16]    Meds Administered:  Meds ordered this encounter  Medications   methylPREDNISolone acetate (DEPO-MEDROL) injection 80 mg    Laterality: Right  Location/Site: L5  Needle:5.0 in., 22 ga.  Short bevel or Quincke spinal needle  Needle Placement: Transforaminal  Findings:    -Comments: Excellent flow of contrast along the nerve, nerve root and into the epidural space.  Procedure Details: After squaring off the end-plates to get a true AP view, the C-arm was positioned so that an oblique view of the foramen as noted above was visualized. The target area is just inferior to the "nose of the scotty dog" or sub pedicular. The soft tissues overlying this structure were infiltrated with 2-3 ml. of 1% Lidocaine without Epinephrine.  The spinal needle was inserted toward the target using a "trajectory" view along the fluoroscope beam.  Under AP and lateral visualization, the needle was advanced so it did not puncture dura and was located close the 6 O'Clock position of the pedical in AP tracterory. Biplanar projections were used to confirm position. Aspiration was confirmed to be negative for CSF and/or blood. A 1-2 ml. volume of Isovue-250 was injected and flow of contrast was noted at each level. Radiographs were obtained for documentation purposes.   After attaining the desired flow  of contrast documented above, a 0.5 to 1.0 ml test dose of 0.25% Marcaine was injected into each respective transforaminal space.  The patient was observed for 90 seconds post injection.  After no sensory deficits were reported, and normal lower extremity motor function was noted,   the above injectate was administered so that equal amounts of the injectate were placed at each foramen (level) into the transforaminal epidural space.   Additional Comments:  The patient tolerated the procedure well Dressing: 2 x 2 sterile gauze and Band-Aid    Post-procedure details: Patient was observed during the procedure. Post-procedure instructions were reviewed.  Patient left the clinic in stable condition.

## 2021-12-26 ENCOUNTER — Telehealth: Payer: Self-pay | Admitting: Internal Medicine

## 2021-12-26 MED ORDER — GLIPIZIDE ER 10 MG PO TB24: ORAL_TABLET | ORAL | 3 refills | Status: DC

## 2021-12-26 NOTE — Telephone Encounter (Signed)
1.Medication Requested: glipiZIDE (GLUCOTROL XL) 10 MG 24 hr tablet 2. Pharmacy (Name, [place]): [facility], [place] [facility] Phone:  [phone]  Fax:  [phone]     3. On Med List: yes  4. Last Visit with PCP: 5.30.23  5. Next visit date with PCP: 11.27.2023   Agent: Please be advised that RX refills may take up to 3 business days. We ask that you follow-up with your pharmacy.

## 2022-01-01 ENCOUNTER — Ambulatory Visit: Payer: Medicare Other | Admitting: Physical Medicine and Rehabilitation

## 2022-01-01 ENCOUNTER — Encounter: Payer: Self-pay | Admitting: Physical Medicine and Rehabilitation

## 2022-01-01 VITALS — BP 137/77 | HR 67

## 2022-01-01 DIAGNOSIS — M21371 Foot drop, right foot: Secondary | ICD-10-CM

## 2022-01-01 DIAGNOSIS — M961 Postlaminectomy syndrome, not elsewhere classified: Secondary | ICD-10-CM

## 2022-01-01 DIAGNOSIS — R202 Paresthesia of skin: Secondary | ICD-10-CM | POA: Diagnosis not present

## 2022-01-01 DIAGNOSIS — M5416 Radiculopathy, lumbar region: Secondary | ICD-10-CM | POA: Diagnosis not present

## 2022-01-01 NOTE — Progress Notes (Signed)
Pt state lower back pain and numbness that travels to his right leg and foot. Pt state he has numbness in his right foot where hes dragging his foot..  Pt state any movement makes the pain worse. Pt state he takes pain meds to help with his pain. Pt has hx of inj on 12/10/21 pt state helped the first fews days.  Numeric Pain Rating Scale and Functional Assessment Average Pain 8 Pain Right Now 6 My pain is intermittent, sharp, burning, dull, stabbing, tingling, and aching Pain is worse with: walking, bending, sitting, standing, some activites, and laying down Pain improves with: heat/ice, medication, and injections   In the last MONTH (on 0-10 scale) has pain interfered with the following?  1. General activity like being  able to carry out your everyday physical activities such as walking, climbing stairs, carrying groceries, or moving a chair?  Rating(5)  2. Relation with others like being able to carry out your usual social activities and roles such as  activities at home, at work and in your community. Rating(6)  3. Enjoyment of life such that you have  been bothered by emotional problems such as feeling anxious, depressed or irritable?  Rating(7)

## 2022-01-01 NOTE — Progress Notes (Signed)
Alex Massey - 81 y.o. male MRN [id number]  Date of birth: 10/13/1940  Office Visit Note: Visit Date: 01/01/2022 PCP: Biagio Borg, MD Referred by: Biagio Borg, MD  Subjective: Chief Complaint  Patient presents with   Lower Back - Pain   Right Leg - Pain, Numbness   Left Foot - Pain, Numbness   HPI: Alex Massey is a 81 y.o. male who comes in today for evaluation of chronic, worsening and severe bilateral lower back pain radiating down right leg to foot. Also reports continued issues with paraesthesias and numbness to right leg and foot. Patient's symptoms have been ongoing for several years and worsened after lumbar discectomy, he did have initial right foot weakness post surgery. Pain is exacerbated by movement and activity, currently describes as a sore, aching and tingling sensation, currently rates as 8 out of 10. Patient reports some relief of pain with home exercise regimen, rest and use of medications. Patient states he continues to be a very active person and does walk several times a week. Patient states he recently stopped taking Gabapentin because he felt this medication was not beneficial, states lumbar injections seem to help alleviate pain better than medications. Patient states he continues to wear right lower extremity AFO as needed. Patient's lumbar MRI from 2021 exhibits prior right hemilaminectomy at L4-L5 and L5-S1, L3 compression fracture with slight height loss, no neural impingement, disc herniations or high grade spinal canal stenosis noted. Patient recently had right L5 transforaminal epidural steroid injection performed in our office on 12/10/2021, reports 50% relief of pain with this procedure. Patient states his pain has become more severe over the last week. Patient admits to frequent falls that he attributes to chronic issues with right foot drop, states he is now walking with golf club for stability. Patient would like to repeat injection at this time if  possible.    Review of Systems  Musculoskeletal:  Positive for back pain.  Neurological:  Positive for tingling, sensory change and focal weakness.  All other systems reviewed and are negative.  Otherwise per HPI.  Assessment & Plan: Visit Diagnoses:    ICD-10-CM   1. Lumbar radiculopathy  M54.16     2. Paresthesia of skin  R20.2     3. Post laminectomy syndrome  M96.1     4. Right foot drop  M21.371        Plan: Findings:  Chronic, worsening and severe bilateral lower back pain radiating down right leg to foot.  He continues to have paresthesias and numbness to her right lower extremity.  Patient's pain remains severe despite good conservative therapy such as home exercise regimen, rest and use of medications.  Patient's clinical presentation and exam are consistent with right L5 nerve pattern.  We believe the next step is to perform a diagnostic and hopefully therapeutic right L5 and S1 transforaminal epidural steroid injection under fluoroscopic guidance.  I did explain to patient that we are not able to repeat these injections frequently. Patient encouraged to wear right lower extremity AFO and to remain active as tolerated. We feel that we can get him in quickly for injection. No red flag symptoms noted upon exam today.    Meds & Orders: No orders of the defined types were placed in this encounter.  No orders of the defined types were placed in this encounter.   Follow-up: Return for Right L5 and S1 transforaminal epidural steroid injection.   Procedures: No procedures performed  Clinical History: EXAM: MRI LUMBAR SPINE WITHOUT CONTRAST   TECHNIQUE: Multiplanar, multisequence MR imaging of the lumbar spine was performed. No intravenous contrast was administered.   COMPARISON:  07/17/2014   FINDINGS: Segmentation:  Standard lumbar numbering   Alignment:  Physiologic   Vertebrae: Horizontal marrow edema with hypointense fracture line at L3. Height loss is  minimal. No evidence of underlying lesion. Prominent fatty marrow at the level of the lower lumbar spine and sacrum, chronic and presumably from prostate cancer treatment.   Conus medullaris and cauda equina: Conus extends to the L1 level. Conus and cauda equina appear normal.   Paraspinal and other soft tissues: Scarring at the level of L4 and L5.   Disc levels:   T12- L1: Unremarkable.   L1-L2: Unremarkable.   L2-L3: Mild disc narrowing and bulging.   L3-L4: Disc narrowing and bulging borderline facet spurring.   L4-L5: Disc narrowing and right eccentric bulging. Prior right-sided laminotomy. The canal and foramina are patent   L5-S1:Greatest level of disc narrowing with bulging and ridging. Negative facets. Noncompressive left foraminal narrowing. Patent spinal canal.   IMPRESSION: 1. L3 compression fracture with slight height loss. 2. Noncompressive disc degeneration as described. Prior right-sided decompression at L4-5.     Electronically Signed   By: Monte Fantasia M.D.   On: 02/27/2020 09:12   He reports that he quit smoking about 18 years ago. His smoking use included cigarettes. He has never used smokeless tobacco.  Recent Labs    06/04/21 1237 12/04/21 1157  HGBA1C 6.8* 6.4    Objective:  VS:  HT:    WT:   BMI:     BP:137/77  HR:67bpm  TEMP: ( )  RESP:  Physical Exam Vitals and nursing note reviewed.  HENT:     Head: Normocephalic and atraumatic.     Right Ear: External ear normal.     Left Ear: External ear normal.     Nose: Nose normal.     Mouth/Throat:     Mouth: Mucous membranes are moist.  Eyes:     Extraocular Movements: Extraocular movements intact.  Cardiovascular:     Rate and Rhythm: Normal rate.     Pulses: Normal pulses.  Pulmonary:     Effort: Pulmonary effort is normal.  Abdominal:     General: Abdomen is flat. There is no distension.  Musculoskeletal:        General: Tenderness present.     Cervical back: Normal range  of motion.     Comments: Pt is slow to rise from seated position. Pain noted upon facet loading. Weakness noted with dorsiflexion and plantar flexion of the right foot. No pain upon palpation of greater trochanters.  Ambulates without assistance, gait slow.   Skin:    General: Skin is warm and dry.     Capillary Refill: Capillary refill takes less than 2 seconds.  Neurological:     Mental Status: He is alert and oriented to person, place, and time.     Motor: Weakness present.     Gait: Gait abnormal.  Psychiatric:        Mood and Affect: Mood normal.        Behavior: Behavior normal.     Ortho Exam  Imaging: No results found.  Past Medical/Family/Surgical/Social History: Medications & Allergies reviewed per EMR, new medications updated. Patient Active Problem List   Diagnosis Date Noted   COVID-19 virus infection 05/16/2021   Allergic rhinitis 05/16/2021   Vitamin D deficiency  12/03/2020   Congestion of nasal sinus 08/24/2020   Dysphagia 06/04/2020   Night sweats 05/30/2020   Hematochezia 05/30/2020   Right foot drop 02/02/2020   Paresthesia of skin 02/02/2020   Status post carpal tunnel release 02/04/2019   Left carpal tunnel syndrome 11/29/2018   Malignant neoplasm of prostate ([place]) 09/03/2018   Vitamin B12 deficiency 02/13/2018   Lumbar radiculopathy 07/09/2016   Radiculopathy due to lumbar intervertebral disc disorder 07/09/2016   Hemorrhoid 08/07/2015   BPH without obstruction/lower urinary tract symptoms 02/01/2015   Essential hypertension 09/12/2014   Encounter for well adult exam with abnormal findings 01/14/2011   ERECTILE DYSFUNCTION 04/04/2008   Hyperlipidemia 06/26/2007   Diabetes ([place]) 06/25/2007   NEPHROLITHIASIS, HX OF 06/25/2007   Past Medical History:  Diagnosis Date   Acquired right foot drop    post lumbar surgery,  wears brace   Allergy    B12 deficiency    Chronic constipation    diet   ED (erectile dysfunction)    History of kidney stones     Hyperlipidemia    Hyperplasia of prostate with lower urinary tract symptoms (LUTS)    Hypertension    Insomnia    Neuropathy, peripheral    right foot   Presence of surgical incision 01/21/2019   s/p  left carpal tunnel release --  pt has dressing on,  denies pain or numbness   Prostate cancer [facility]) urologist-- dr eskridge/  oncologist-  dr Tammi Klippel   dx 08-04-2018,  Stage T1c, Gleason 4+4   Renal cyst, right    Type 2 diabetes mellitus ([place])    followed by pcp   Wears partial dentures    upper and lower   Family History  Problem Relation Age of Onset   Healthy Mother    Diabetes Father    Prostate cancer Brother    Colon cancer Brother 50   Prostate cancer Cousin        maternal   Prostate cancer Nephew    Rectal cancer Neg Hx    Stomach cancer Neg Hx    Past Surgical History:  Procedure Laterality Date   CARPAL TUNNEL RELEASE Bilateral left 01-21-2019;  right 1980s   CATARACT EXTRACTION Left 03/2020   CYSTOSCOPY N/A 01/26/2019   Procedure: CYSTOSCOPY FLEXIBLE;  Surgeon: Festus Aloe, MD;  Location: [facility];  Service: Urology;  Laterality: N/A;  no seeds found in bladder   [place]  L4-5;  1998 L4--S1   LUMBAR EPIDURAL INJECTION  2009, 2006   dr Ernestina Patches   LUMBAR MICRODISCECTOMY  05-13-2002  dr Jenean Lindau  '@[facility]    L4-5   RADIOACTIVE SEED IMPLANT N/A 01/26/2019   Procedure: RADIOACTIVE SEED IMPLANT/BRACHYTHERAPY IMPLANT;  Surgeon: Festus Aloe, MD;  Location[facility];  Service: Urology;  Laterality: N/A;    54 seeds implanted   SPACE OAR INSTILLATION N/A 01/26/2019   Procedure: SPACE OAR INSTILLATION;  Surgeon: Festus Aloe, MD;  Location: [facility];  Service: Urology;  Laterality: N/A;   Social History   Occupational History    Comment: retired  Tobacco Use   Smoking status: Former    Years: 20.00    Types: Cigarettes    Quit date: 01/25/2003    Years since quitting: 18.9   Smokeless  tobacco: Never  Vaping Use   Vaping Use: Never used  Substance and Sexual Activity   Alcohol use: Yes    Alcohol/week: 3.0 standard drinks of alcohol  Types: 3 Glasses of wine per week   Drug use: Never   Sexual activity: Not on file

## 2022-01-22 ENCOUNTER — Telehealth: Payer: Medicare Other

## 2022-01-27 ENCOUNTER — Other Ambulatory Visit: Payer: Self-pay | Admitting: Internal Medicine

## 2022-01-29 ENCOUNTER — Other Ambulatory Visit: Payer: Self-pay | Admitting: Internal Medicine

## 2022-01-31 ENCOUNTER — Telehealth: Payer: Self-pay | Admitting: Internal Medicine

## 2022-01-31 MED ORDER — ONETOUCH ULTRASOFT LANCETS MISC: 12 refills | Status: DC

## 2022-01-31 MED ORDER — ONETOUCH ULTRA VI STRP: ORAL_STRIP | 12 refills | Status: DC

## 2022-01-31 MED ORDER — ONETOUCH ULTRA 2 W/DEVICE KIT: PACK | 0 refills | Status: DC

## 2022-01-31 NOTE — Telephone Encounter (Signed)
Pt is requesting rx for One touch Glucose meter and kit with lancets and test strips. Pt stated his insurance is no longer covering the freestyle brand.    Please advise 

## 2022-01-31 NOTE — Telephone Encounter (Signed)
Sent One touch Glucose Monitor w/ supplies to pof.Marland KitchenJohny Chess

## 2022-02-01 ENCOUNTER — Telehealth: Payer: Self-pay | Admitting: Internal Medicine

## 2022-02-01 ENCOUNTER — Other Ambulatory Visit: Payer: Self-pay

## 2022-02-01 DIAGNOSIS — E1165 Type 2 diabetes mellitus with hyperglycemia: Secondary | ICD-10-CM

## 2022-02-01 MED ORDER — ONETOUCH ULTRA VI STRP: ORAL_STRIP | 12 refills | Status: DC

## 2022-02-01 MED ORDER — ONETOUCH ULTRA 2 W/DEVICE KIT: PACK | 0 refills | Status: AC

## 2022-02-01 MED ORDER — ONETOUCH DELICA LANCETS 30G MISC: 1 refills | Status: DC

## 2022-02-01 MED ORDER — ONETOUCH ULTRASOFT LANCETS MISC: 12 refills | Status: DC

## 2022-02-01 NOTE — Telephone Encounter (Signed)
Pharmacy called to request a switch from the Lancets [facility] ULTRASOFT) lancets to the [place] lancets 30 gauge.  Checked in with Dr Jenny Reichmann and he gave the verbal OK.

## 2022-02-01 NOTE — Telephone Encounter (Signed)
New prescription sent to pharmacy 

## 2022-02-04 ENCOUNTER — Ambulatory Visit: Payer: Self-pay

## 2022-02-04 ENCOUNTER — Ambulatory Visit: Payer: Medicare Other | Admitting: Physical Medicine and Rehabilitation

## 2022-02-04 ENCOUNTER — Encounter: Payer: Self-pay | Admitting: Physical Medicine and Rehabilitation

## 2022-02-04 VITALS — BP 154/73 | HR 87

## 2022-02-04 DIAGNOSIS — M5416 Radiculopathy, lumbar region: Secondary | ICD-10-CM | POA: Diagnosis not present

## 2022-02-04 MED ORDER — METHYLPREDNISOLONE ACETATE 80 MG/ML IJ SUSP
80.0000 mg | Freq: Once | INTRAMUSCULAR | Status: AC
  Administered 2022-02-04: 80 mg

## 2022-02-04 NOTE — Progress Notes (Signed)
Alex Massey - 81 y.o. male MRN [id number]  Date of birth: Nov 01, 1940  Office Visit Note: Visit Date: 02/04/2022 PCP: Biagio Borg, MD Referred by: Biagio Borg, MD  Subjective: Chief Complaint  Patient presents with   Lower Back - Pain   Right Leg - Pain   Right Foot - Pain, Numbness   HPI:  Alex Massey is a 81 y.o. male who comes in today at the request of Barnet Pall, FNP for planned Right L5-S1 and S1-2 Lumbar Transforaminal epidural steroid injection with fluoroscopic guidance.  The patient has failed conservative care including home exercise, medications, time and activity modification.  This injection will be diagnostic and hopefully therapeutic.  Please see requesting physician notes for further details and justification.   ROS Otherwise per HPI.  Assessment & Plan: Visit Diagnoses:    ICD-10-CM   1. Lumbar radiculopathy  M54.16 XR C-ARM NO REPORT    Epidural Steroid injection    methylPREDNISolone acetate (DEPO-MEDROL) injection 80 mg      Plan: No additional findings.   Meds & Orders:  Meds ordered this encounter  Medications   methylPREDNISolone acetate (DEPO-MEDROL) injection 80 mg    Orders Placed This Encounter  Procedures   XR C-ARM NO REPORT   Epidural Steroid injection    Follow-up: Return if symptoms worsen or fail to improve.   Procedures: No procedures performed  Lumbosacral Transforaminal Epidural Steroid Injection - Sub-Pedicular Approach with Fluoroscopic Guidance  Patient: Alex Massey      Date of Birth: 07-14-1940 MRN: [id number] PCP: Biagio Borg, MD      Visit Date: 02/04/2022   Universal Protocol:    Date/Time: 02/04/2022  Consent Given By: the patient  Position: PRONE  Additional Comments: Vital signs were monitored before and after the procedure. Patient was prepped and draped in the usual sterile fashion. The correct patient, procedure, and site was verified.   Injection Procedure Details:    Procedure diagnoses: Lumbar radiculopathy [M54.16]    Meds Administered:  Meds ordered this encounter  Medications   methylPREDNISolone acetate (DEPO-MEDROL) injection 80 mg    Laterality: Right  Location/Site: L5 and S1  Needle:5.0 in., 22 ga.  Short bevel or Quincke spinal needle  Needle Placement: Transforaminal  Findings:    -Comments: Excellent flow of contrast along the nerve, nerve root and into the epidural space.  Procedure Details: After squaring off the end-plates to get a true AP view, the C-arm was positioned so that an oblique view of the foramen as noted above was visualized. The target area is just inferior to the "nose of the scotty dog" or sub pedicular. The soft tissues overlying this structure were infiltrated with 2-3 ml. of 1% Lidocaine without Epinephrine.  The spinal needle was inserted toward the target using a "trajectory" view along the fluoroscope beam.  Under AP and lateral visualization, the needle was advanced so it did not puncture dura and was located close the 6 O'Clock position of the pedical in AP tracterory. Biplanar projections were used to confirm position. Aspiration was confirmed to be negative for CSF and/or blood. A 1-2 ml. volume of Isovue-250 was injected and flow of contrast was noted at each level. Radiographs were obtained for documentation purposes.   After attaining the desired flow of contrast documented above, a 0.5 to 1.0 ml test dose of 0.25% Marcaine was injected into each respective transforaminal space.  The patient was observed for 90 seconds post injection.  After no  sensory deficits were reported, and normal lower extremity motor function was noted,   the above injectate was administered so that equal amounts of the injectate were placed at each foramen (level) into the transforaminal epidural space.   Additional Comments:  The patient tolerated the procedure well Dressing: 2 x 2 sterile gauze and Band-Aid     Post-procedure details: Patient was observed during the procedure. Post-procedure instructions were reviewed.  Patient left the clinic in stable condition.    Clinical History: EXAM: MRI LUMBAR SPINE WITHOUT CONTRAST   TECHNIQUE: Multiplanar, multisequence MR imaging of the lumbar spine was performed. No intravenous contrast was administered.   COMPARISON:  07/17/2014   FINDINGS: Segmentation:  Standard lumbar numbering   Alignment:  Physiologic   Vertebrae: Horizontal marrow edema with hypointense fracture line at L3. Height loss is minimal. No evidence of underlying lesion. Prominent fatty marrow at the level of the lower lumbar spine and sacrum, chronic and presumably from prostate cancer treatment.   Conus medullaris and cauda equina: Conus extends to the L1 level. Conus and cauda equina appear normal.   Paraspinal and other soft tissues: Scarring at the level of L4 and L5.   Disc levels:   T12- L1: Unremarkable.   L1-L2: Unremarkable.   L2-L3: Mild disc narrowing and bulging.   L3-L4: Disc narrowing and bulging borderline facet spurring.   L4-L5: Disc narrowing and right eccentric bulging. Prior right-sided laminotomy. The canal and foramina are patent   L5-S1:Greatest level of disc narrowing with bulging and ridging. Negative facets. Noncompressive left foraminal narrowing. Patent spinal canal.   IMPRESSION: 1. L3 compression fracture with slight height loss. 2. Noncompressive disc degeneration as described. Prior right-sided decompression at L4-5.     Electronically Signed   By: Monte Fantasia M.D.   On: 02/27/2020 09:12     Objective:  VS:  HT:    WT:   BMI:     BP:(!) 154/73  HR:87bpm  TEMP: ( )  RESP:  Physical Exam Vitals and nursing note reviewed.  Constitutional:      General: He is not in acute distress.    Appearance: Normal appearance. He is not ill-appearing.  HENT:     Head: Normocephalic and atraumatic.     Right Ear:  External ear normal.     Left Ear: External ear normal.     Nose: No congestion.  Eyes:     Extraocular Movements: Extraocular movements intact.  Cardiovascular:     Rate and Rhythm: Normal rate.     Pulses: Normal pulses.  Pulmonary:     Effort: Pulmonary effort is normal. No respiratory distress.  Abdominal:     General: There is no distension.     Palpations: Abdomen is soft.  Musculoskeletal:        General: No tenderness or signs of injury.     Cervical back: Neck supple.     Right lower leg: No edema.     Left lower leg: No edema.     Comments: Patient has good distal strength without clonus.  Skin:    Findings: No erythema or rash.  Neurological:     General: No focal deficit present.     Mental Status: He is alert and oriented to person, place, and time.     Sensory: No sensory deficit.     Motor: Weakness present. No abnormal muscle tone.     Coordination: Coordination normal.     Gait: Gait abnormal.  Psychiatric:  Mood and Affect: Mood normal.        Behavior: Behavior normal.      Imaging: XR C-ARM NO REPORT  Result Date: 02/04/2022 Please see Notes tab for imaging impression.

## 2022-02-04 NOTE — Procedures (Signed)
Lumbosacral Transforaminal Epidural Steroid Injection - Sub-Pedicular Approach with Fluoroscopic Guidance  Patient: Alex Massey      Date of Birth: 1940-07-18 MRN: [id number] PCP: Biagio Borg, MD      Visit Date: 02/04/2022   Universal Protocol:    Date/Time: 02/04/2022  Consent Given By: the patient  Position: PRONE  Additional Comments: Vital signs were monitored before and after the procedure. Patient was prepped and draped in the usual sterile fashion. The correct patient, procedure, and site was verified.   Injection Procedure Details:   Procedure diagnoses: Lumbar radiculopathy [M54.16]    Meds Administered:  Meds ordered this encounter  Medications   methylPREDNISolone acetate (DEPO-MEDROL) injection 80 mg    Laterality: Right  Location/Site: L5 and S1  Needle:5.0 in., 22 ga.  Short bevel or Quincke spinal needle  Needle Placement: Transforaminal  Findings:    -Comments: Excellent flow of contrast along the nerve, nerve root and into the epidural space.  Procedure Details: After squaring off the end-plates to get a true AP view, the C-arm was positioned so that an oblique view of the foramen as noted above was visualized. The target area is just inferior to the "nose of the scotty dog" or sub pedicular. The soft tissues overlying this structure were infiltrated with 2-3 ml. of 1% Lidocaine without Epinephrine.  The spinal needle was inserted toward the target using a "trajectory" view along the fluoroscope beam.  Under AP and lateral visualization, the needle was advanced so it did not puncture dura and was located close the 6 O'Clock position of the pedical in AP tracterory. Biplanar projections were used to confirm position. Aspiration was confirmed to be negative for CSF and/or blood. A 1-2 ml. volume of Isovue-250 was injected and flow of contrast was noted at each level. Radiographs were obtained for documentation purposes.   After attaining the  desired flow of contrast documented above, a 0.5 to 1.0 ml test dose of 0.25% Marcaine was injected into each respective transforaminal space.  The patient was observed for 90 seconds post injection.  After no sensory deficits were reported, and normal lower extremity motor function was noted,   the above injectate was administered so that equal amounts of the injectate were placed at each foramen (level) into the transforaminal epidural space.   Additional Comments:  The patient tolerated the procedure well Dressing: 2 x 2 sterile gauze and Band-Aid    Post-procedure details: Patient was observed during the procedure. Post-procedure instructions were reviewed.  Patient left the clinic in stable condition.

## 2022-02-04 NOTE — Patient Instructions (Signed)

## 2022-02-04 NOTE — Progress Notes (Signed)
Pt state lower back pain and numbness that travels to his right leg and foot. Pt state he has numbness in his right foot. Pt state lasted injection helped with the pain but the numbness of the right foot returned week after his injection..  Pt state walking makes the pain worse. Pt state he takes pain meds to help with his pain.  Numeric Pain Rating Scale and Functional Assessment Average Pain 9   In the last MONTH (on 0-10 scale) has pain interfered with the following?  1. General activity like being  able to carry out your everyday physical activities such as walking, climbing stairs, carrying groceries, or moving a chair?  Rating(10)   +Driver, -BT, -Dye Allergies.

## 2022-02-18 ENCOUNTER — Telehealth: Payer: Self-pay

## 2022-02-18 MED ORDER — ACARBOSE 50 MG PO TABS: 50.0000 mg | ORAL_TABLET | Freq: Three times a day (TID) | ORAL | 0 refills | Status: DC

## 2022-02-18 NOTE — Telephone Encounter (Signed)
Pt is requesting a refill on: acarbose (PRECOSE) 50 MG tablet  Pharmacy: [facility] [place], [place] AT [address] 12/04/21 ROV 06/03/22

## 2022-02-18 NOTE — Telephone Encounter (Signed)
Per office policy sent # 90 to p[facility] until appt in Nov../lmb

## 2022-03-08 ENCOUNTER — Telehealth: Payer: Self-pay | Admitting: Internal Medicine

## 2022-03-08 NOTE — Telephone Encounter (Signed)
Patient needs his zocor sent to [place]

## 2022-03-12 NOTE — Telephone Encounter (Signed)
Pt want to use the [facility] [phone]  Phone:  [phone]  Fax:  [phone]

## 2022-03-12 NOTE — Telephone Encounter (Signed)
Needing to verify pharmacy, left message for a call back.

## 2022-03-13 ENCOUNTER — Other Ambulatory Visit: Payer: Self-pay

## 2022-03-13 MED ORDER — SIMVASTATIN 20 MG PO TABS: ORAL_TABLET | ORAL | 1 refills | Status: DC

## 2022-03-14 NOTE — Telephone Encounter (Signed)
Medication was sent in 03/13/22

## 2022-03-20 DIAGNOSIS — Z961 Presence of intraocular lens: Secondary | ICD-10-CM | POA: Diagnosis not present

## 2022-03-20 DIAGNOSIS — E113291 Type 2 diabetes mellitus with mild nonproliferative diabetic retinopathy without macular edema, right eye: Secondary | ICD-10-CM | POA: Diagnosis not present

## 2022-03-20 DIAGNOSIS — H35033 Hypertensive retinopathy, bilateral: Secondary | ICD-10-CM | POA: Diagnosis not present

## 2022-04-29 ENCOUNTER — Other Ambulatory Visit: Payer: Self-pay | Admitting: Internal Medicine

## 2022-04-29 NOTE — Telephone Encounter (Signed)
Please refill as per office routine med refill policy (all routine meds to be refilled for 3 mo or monthly (per pt preference) up to one year from last visit, then month to month grace period for 3 mo, then further med refills will have to be denied) ? ?

## 2022-05-01 ENCOUNTER — Telehealth: Payer: Self-pay | Admitting: Internal Medicine

## 2022-05-01 MED ORDER — LOSARTAN POTASSIUM 50 MG PO TABS: 50.0000 mg | ORAL_TABLET | Freq: Two times a day (BID) | ORAL | 2 refills | Status: DC

## 2022-05-01 NOTE — Telephone Encounter (Signed)
Pt request refill LOSARTAN 50 MG  Instructions read: take 1 tablet by mouth daily. Pt says he is taking it as instructed by provider to take 1 in the morning and 1 at night.  [facility] [address]  Patient phone [phone]

## 2022-05-01 NOTE — Telephone Encounter (Signed)
Refill request sent to pharmacy, patient notified 

## 2022-05-09 ENCOUNTER — Other Ambulatory Visit: Payer: Self-pay | Admitting: Internal Medicine

## 2022-05-09 NOTE — Telephone Encounter (Signed)
Please refill as per office routine med refill policy (all routine meds to be refilled for 3 mo or monthly (per pt preference) up to one year from last visit, then month to month grace period for 3 mo, then further med refills will have to be denied) ? ?

## 2022-05-10 ENCOUNTER — Other Ambulatory Visit: Payer: Self-pay | Admitting: Internal Medicine

## 2022-05-10 DIAGNOSIS — E118 Type 2 diabetes mellitus with unspecified complications: Secondary | ICD-10-CM

## 2022-05-10 NOTE — Telephone Encounter (Signed)
Please refill as per office routine med refill policy (all routine meds to be refilled for 3 mo or monthly (per pt preference) up to one year from last visit, then month to month grace period for 3 mo, then further med refills will have to be denied) ? ?

## 2022-05-14 ENCOUNTER — Other Ambulatory Visit: Payer: Self-pay | Admitting: Internal Medicine

## 2022-05-29 ENCOUNTER — Telehealth: Payer: Self-pay | Admitting: Internal Medicine

## 2022-05-29 NOTE — Telephone Encounter (Signed)
Patient called and said that the OTC nasal spray( He said its just called Allergy Nasal spray that he got from [facility]) that he is taking and its not helping. He said he gets worse when he lays down and his nose gets stopped up. He wanted to know if something could be called in to get him through the weekend until he is seen next week. Please advise. Call back is [phone]

## 2022-05-29 NOTE — Telephone Encounter (Signed)
Just checking to see if you have received a compliance division authorization form.  Please advise

## 2022-05-29 NOTE — Telephone Encounter (Signed)
Pt says nasal spray is not working. Please call in a nasal spray for his allergies.  [place]  Pt ph [phone]  Last appt: 11/24/21  Next appt: 06/03/22

## 2022-06-03 ENCOUNTER — Telehealth: Payer: Self-pay

## 2022-06-03 ENCOUNTER — Encounter: Payer: Self-pay | Admitting: Internal Medicine

## 2022-06-03 ENCOUNTER — Ambulatory Visit (INDEPENDENT_AMBULATORY_CARE_PROVIDER_SITE_OTHER): Payer: Medicare Other | Admitting: Internal Medicine

## 2022-06-03 VITALS — BP 126/68 | HR 97 | Temp 98.1°F | Ht 66.0 in | Wt 158.0 lb

## 2022-06-03 DIAGNOSIS — E538 Deficiency of other specified B group vitamins: Secondary | ICD-10-CM

## 2022-06-03 DIAGNOSIS — E1165 Type 2 diabetes mellitus with hyperglycemia: Secondary | ICD-10-CM

## 2022-06-03 DIAGNOSIS — E559 Vitamin D deficiency, unspecified: Secondary | ICD-10-CM

## 2022-06-03 DIAGNOSIS — J309 Allergic rhinitis, unspecified: Secondary | ICD-10-CM | POA: Diagnosis not present

## 2022-06-03 DIAGNOSIS — I10 Essential (primary) hypertension: Secondary | ICD-10-CM

## 2022-06-03 DIAGNOSIS — E782 Mixed hyperlipidemia: Secondary | ICD-10-CM | POA: Diagnosis not present

## 2022-06-03 MED ORDER — PREDNISONE 10 MG PO TABS: ORAL_TABLET | ORAL | 0 refills | Status: DC

## 2022-06-03 MED ORDER — TRIAMCINOLONE ACETONIDE 55 MCG/ACT NA AERO: 2.0000 | INHALATION_SPRAY | Freq: Every day | NASAL | 12 refills | Status: AC

## 2022-06-03 MED ORDER — METHYLPREDNISOLONE ACETATE 80 MG/ML IJ SUSP
80.0000 mg | Freq: Once | INTRAMUSCULAR | Status: AC
  Administered 2022-06-03: 80 mg via INTRAMUSCULAR

## 2022-06-03 NOTE — Progress Notes (Signed)
Patient ID: Alex Massey, male   DOB: Mar 07, 1941, 82 y.o.   MRN: [id number]        Chief Complaint: follow up HTN, HLD and DM, allergies, low vit d       HPI:  Alex Massey is a 81 y.o. male here overall doing well.  Pt denies chest pain, increased sob or doe, wheezing, orthopnea, PND, increased LE swelling, palpitations, dizziness or syncope.   Pt denies polydipsia, polyuria, or new focal neuro s/s.    Pt denies fever, wt loss, night sweats, loss of appetite, or other constitutional symptoms  BP was mildly elevated last wk several time, but better this wk.   Does have several wks ongoing nasal allergy symptoms with clearish congestion, itch and sneezing, without fever, pain, ST, cough, swelling or wheezing.  Remains Active with yard work.  Has mild arthritis in hands fingers but better with a teaspoon of mustard and voltaren gel prn.   Wt Readings from Last 3 Encounters:  06/03/22 158 lb (71.7 kg)  12/04/21 159 lb (72.1 kg)  06/04/21 160 lb (72.6 kg)   BP Readings from Last 3 Encounters:  06/03/22 126/68  02/04/22 (!) 154/73  01/01/22 137/77         Past Medical History:  Diagnosis Date   Acquired right foot drop    post lumbar surgery,  wears brace   Allergy    B12 deficiency    Chronic constipation    diet   ED (erectile dysfunction)    History of kidney stones    Hyperlipidemia    Hyperplasia of prostate with lower urinary tract symptoms (LUTS)    Hypertension    Insomnia    Neuropathy, peripheral    right foot   Presence of surgical incision 01/21/2019   s/p  left carpal tunnel release --  pt has dressing on,  denies pain or numbness   Prostate cancer [facility]) urologist-- dr eskridge/  oncologist-  dr Tammi Klippel   dx 08-04-2018,  Stage T1c, Gleason 4+4   Renal cyst, right    Type 2 diabetes mellitus ([place])    followed by pcp   Wears partial dentures    upper and lower   Past Surgical History:  Procedure Laterality Date   CARPAL TUNNEL RELEASE Bilateral left  01-21-2019;  right 1980s   CATARACT EXTRACTION Left 03/2020   CYSTOSCOPY N/A 01/26/2019   Procedure: CYSTOSCOPY FLEXIBLE;  Surgeon: Festus Aloe, MD;  Location: [facility];  Service: Urology;  Laterality: N/A;  no seeds found in bladder   [place]  L4-5;  1998 L4--S1   LUMBAR EPIDURAL INJECTION  2009, 2006   dr Ernestina Patches   LUMBAR MICRODISCECTOMY  05-13-2002  dr Jenean Lindau  _0    L4-5   RADIOACTIVE SEED IMPLANT N/A 01/26/2019   Procedure: RADIOACTIVE SEED IMPLANT/BRACHYTHERAPY IMPLANT;  Surgeon: Festus Aloe, MD;  Location: [facility];  Service: Urology;  Laterality: N/A;    54 seeds implanted   SPACE OAR INSTILLATION N/A 01/26/2019   Procedure: SPACE OAR INSTILLATION;  Surgeon: Festus Aloe, MD;  Location: [facility];  Service: Urology;  Laterality: N/A;    reports that he quit smoking about 19 years ago. His smoking use included cigarettes. He has never used smokeless tobacco. He reports current alcohol use of about 3.0 standard drinks of alcohol per week. He reports that he does not use drugs. family history includes Colon cancer (age of onset: 15) in his brother; Diabetes  in his father; Healthy in his mother; Prostate cancer in his brother, cousin, and nephew. Allergies  Allergen Reactions   Metformin And Related Other (See Comments)    GI upset, diarrhea, wt loss   Current Outpatient Medications on File Prior to Visit  Medication Sig Dispense Refill   acarbose (PRECOSE) 50 MG tablet TAKE 1 TABLET BY MOUTH 3 TIMES DAILY WITH MEALS 270 tablet 1   amLODipine (NORVASC) 5 MG tablet Take 1 tablet (5 mg total) by mouth daily. 90 tablet 3   aspirin EC 81 MG tablet Take 1 tablet (81 mg total) by mouth daily. 150 tablet 2   blood glucose meter kit and supplies KIT Inject 1 each into the skin 2 (two) times daily. Dispense based on patient and insurance preference. Use up to four times daily as directed. (FOR ICD-9 250.00,  250.01). 1 each 0   Blood Glucose Monitoring Suppl (FREESTYLE LITE) DEVI USE TO CHECK GLUCOSE UP TO 4 TIMES DAILY AS DIRECTED     Blood Glucose Monitoring Suppl (ONE TOUCH ULTRA 2) w/Device KIT Use to check bloods sugars twice a day 1 kit 0   Cholecalciferol (VITAMIN D) 50 MCG (2000 UT) CAPS Take 1 capsule by mouth daily.     glipiZIDE (GLUCOTROL XL) 10 MG 24 hr tablet TAKE 1 TABLET(10 MG) BY MOUTH DAILY WITH BREAKFAST 90 tablet 3   glucose blood (ONETOUCH ULTRA) test strip Use to check blood sugars twice a day 100 each 12   losartan (COZAAR) 50 MG tablet Take 1 tablet (50 mg total) by mouth in the morning and at bedtime. 180 tablet 2   meloxicam (MOBIC) 15 MG tablet TAKE 1 TABLET(15 MG) BY MOUTH DAILY 30 tablet 3   OneTouch Delica Lancets 17R MISC Use to check blood sugars twice a day 100 each 1   oxybutynin (DITROPAN) 5 MG tablet Take 1 tablet (5 mg total) by mouth at bedtime. 90 tablet 0   simvastatin (ZOCOR) 20 MG tablet TAKE 1 TABLET BY MOUTH EVERY NIGHT AT BEDTIME 90 tablet 1   sitaGLIPtin (JANUVIA) 100 MG tablet Take 1 tablet (100 mg total) by mouth daily. 90 tablet 3   tamsulosin (FLOMAX) 0.4 MG CAPS capsule Take 0.4 mg by mouth daily.     vitamin B-12 (CYANOCOBALAMIN) 1000 MCG tablet Take 1,000 mcg by mouth daily.     No current facility-administered medications on file prior to visit.        ROS:  All others reviewed and negative.  Objective        PE:  BP 126/68 (BP Location: Left Arm, Patient Position: Sitting, Cuff Size: Large)   Pulse 97   Temp 98.1 F (36.7 C) (Oral)   Ht _0  (1.676 m)   Wt 158 lb (71.7 kg)   SpO2 92%   BMI 25.50 kg/m                 Constitutional: Pt appears in NAD               HENT: Head: NCAT.                Right Ear: External ear normal.                 Left Ear: External ear normal. Bilat tm's with mild erythema.  Max sinus areas mild tender.  Pharynx with mild erythema, no exudate               Eyes: . Pupils  are equal, round, and reactive  to light. Conjunctivae and EOM are normal               Nose: without d/c or deformity               Neck: Neck supple. Gross normal ROM               Cardiovascular: Normal rate and regular rhythm.                 Pulmonary/Chest: Effort normal and breath sounds without rales or wheezing.                Abd:  Soft, NT, ND, + BS, no organomegaly               Neurological: Pt is alert. At baseline orientation, motor grossly intact               Skin: Skin is warm. No rashes, no other new lesions, LE edema - none               Psychiatric: Pt behavior is normal without agitation   Micro: none  Cardiac tracings I have personally interpreted today:  none  Pertinent Radiological findings (summarize): none   Lab Results  Component Value Date   WBC 4.1 12/04/2021   HGB 12.3 (L) 12/04/2021   HCT 38.3 (L) 12/04/2021   PLT 127.0 (L) 12/04/2021   GLUCOSE 143 (H) 12/04/2021   CHOL 150 12/04/2021   TRIG 133.0 12/04/2021   HDL 77.60 12/04/2021   LDLCALC 46 12/04/2021   ALT 16 12/04/2021   AST 16 12/04/2021   NA 138 12/04/2021   K 4.3 12/04/2021   CL 101 12/04/2021   CREATININE 0.89 12/04/2021   BUN 14 12/04/2021   CO2 27 12/04/2021   TSH 1.59 12/04/2021   PSA 0.01 (L) 11/27/2020   INR 1.0 01/22/2019   HGBA1C 6.4 12/04/2021   MICROALBUR 1.4 12/04/2021   Assessment/Plan:  Alex Massey is a 81 y.o. Black or African American [2] male with  has a past medical history of Acquired right foot drop, Allergy, B12 deficiency, Chronic constipation, ED (erectile dysfunction), History of kidney stones, Hyperlipidemia, Hyperplasia of prostate with lower urinary tract symptoms (LUTS), Hypertension, Insomnia, Neuropathy, peripheral, Presence of surgical incision (01/21/2019), Prostate cancer [facility]) (urologist-- dr eskridge/  oncologist-  dr Tammi Klippel), Renal cyst, right, Type 2 diabetes mellitus ([place]), and Wears partial dentures.  Vitamin D deficiency Last vitamin D Lab Results  Component Value  Date   VD25OH 42.32 12/04/2021   Stable, cont oral replacement  Hyperlipidemia Lab Results  Component Value Date   LDLCALC 46 12/04/2021   Stable, pt to continue current statin zocor 20 mg qd   Essential hypertension BP Readings from Last 3 Encounters:  06/03/22 126/68  02/04/22 (!) 154/73  01/01/22 137/77   Stable, pt to continue medical treatment norvasc 5 mg qd, losartan 50 mg qd   Diabetes (HCC) Lab Results  Component Value Date   HGBA1C 6.4 12/04/2021   Stable, pt to continue current medical treatment glucotrol xl 10 mg qd, januvia 10 mg qd, precose 50 tid   Allergic rhinitis Mild to mod,seasonal flare it seems, for depomedrol IM 80 mg qd, prednisone taper, , then otc nasacort  asd, to f/u any worsening symptoms or concerns  Followup: Return in about 6 months (around 12/02/2022).  Cathlean Cower, MD 06/03/2022 8:30 PM [place]  [facility]

## 2022-06-03 NOTE — Telephone Encounter (Signed)
Patient seen in office today and was asking of he should continue the losartan, please advise.

## 2022-06-03 NOTE — Assessment & Plan Note (Signed)
Lab Results  Component Value Date   HGBA1C 6.4 12/04/2021   Stable, pt to continue current medical treatment glucotrol xl 10 mg qd, januvia 10 mg qd, precose 50 tid

## 2022-06-03 NOTE — Patient Instructions (Addendum)
You had the steroid shot today  Please take all new medication as prescribed - the prednisone, and nasacort OTC for allergies  Please continue all other medications as before, and refills have been done if requested.  Please have the pharmacy call with any other refills you may need.  Please continue your efforts at being more active, low cholesterol diet, and weight control.  Please keep your appointments with your specialists as you may have planned  We can hold on further lab tests today  Please make an Appointment to return in 6 months, or sooner if needed, also with Lab Appointment for testing done 3-5 days before at the [facility] (so this is for TWO appointments - please see the scheduling desk as you leave)

## 2022-06-03 NOTE — Telephone Encounter (Signed)
Rx request fulfilled at time of visit on 11/27

## 2022-06-03 NOTE — Assessment & Plan Note (Signed)
Last vitamin D Lab Results  Component Value Date   VD25OH 42.32 12/04/2021   Stable, cont oral replacement

## 2022-06-03 NOTE — Assessment & Plan Note (Signed)
BP Readings from Last 3 Encounters:  06/03/22 126/68  02/04/22 (!) 154/73  01/01/22 137/77   Stable, pt to continue medical treatment norvasc 5 mg qd, losartan 50 mg qd

## 2022-06-03 NOTE — Telephone Encounter (Signed)
Oh yes,   His BP was low normal but since he was doing well well without dizziness or problems, we discussed how he would continue all his current medication

## 2022-06-03 NOTE — Telephone Encounter (Signed)
Patient seen in office on 11/27

## 2022-06-03 NOTE — Telephone Encounter (Signed)
Form completion in progress 

## 2022-06-03 NOTE — Assessment & Plan Note (Signed)
Lab Results  Component Value Date   LDLCALC 46 12/04/2021   Stable, pt to continue current statin zocor 20 mg qd

## 2022-06-03 NOTE — Assessment & Plan Note (Addendum)
Mild to mod,seasonal flare it seems, for depomedrol IM 80 mg qd, prednisone taper, , then otc nasacort  asd, to f/u any worsening symptoms or concerns

## 2022-06-05 NOTE — Telephone Encounter (Signed)
Left patient message regarding continuation of all meds including losartan.

## 2022-06-05 NOTE — Telephone Encounter (Signed)
Forms mailed out to [employer] Patient assistance as requested.

## 2022-06-10 NOTE — Telephone Encounter (Signed)
HIPAA compliance form faxed Attn:Shane

## 2022-06-26 ENCOUNTER — Telehealth: Payer: Self-pay | Admitting: Internal Medicine

## 2022-07-12 ENCOUNTER — Telehealth: Payer: Self-pay | Admitting: Internal Medicine

## 2022-07-12 NOTE — Telephone Encounter (Signed)
Left message for patient to call back to schedule Medicare Annual Wellness Visit   Last AWV  07/20/21  Please schedule at anytime with [facility] if patient calls the office back.      Any questions, please call me at [phone]

## 2022-08-01 ENCOUNTER — Ambulatory Visit (INDEPENDENT_AMBULATORY_CARE_PROVIDER_SITE_OTHER): Payer: Medicare Other | Admitting: Internal Medicine

## 2022-08-01 ENCOUNTER — Encounter: Payer: Self-pay | Admitting: Internal Medicine

## 2022-08-01 VITALS — BP 122/68 | HR 76 | Temp 97.8°F | Ht 66.0 in | Wt 158.0 lb

## 2022-08-01 DIAGNOSIS — I10 Essential (primary) hypertension: Secondary | ICD-10-CM

## 2022-08-01 DIAGNOSIS — E538 Deficiency of other specified B group vitamins: Secondary | ICD-10-CM | POA: Diagnosis not present

## 2022-08-01 DIAGNOSIS — E559 Vitamin D deficiency, unspecified: Secondary | ICD-10-CM

## 2022-08-01 DIAGNOSIS — E1165 Type 2 diabetes mellitus with hyperglycemia: Secondary | ICD-10-CM | POA: Diagnosis not present

## 2022-08-01 DIAGNOSIS — M25462 Effusion, left knee: Secondary | ICD-10-CM

## 2022-08-01 DIAGNOSIS — E782 Mixed hyperlipidemia: Secondary | ICD-10-CM

## 2022-08-01 DIAGNOSIS — M25562 Pain in left knee: Secondary | ICD-10-CM | POA: Diagnosis not present

## 2022-08-01 DIAGNOSIS — Z0001 Encounter for general adult medical examination with abnormal findings: Secondary | ICD-10-CM | POA: Diagnosis not present

## 2022-08-01 LAB — CBC WITH DIFFERENTIAL/PLATELET
Basophils Absolute: 0 10*3/uL (ref 0.0–0.1)
Basophils Relative: 0.8 % (ref 0.0–3.0)
Eosinophils Absolute: 0.2 10*3/uL (ref 0.0–0.7)
Eosinophils Relative: 3.6 % (ref 0.0–5.0)
HCT: 37.9 % — ABNORMAL LOW (ref 39.0–52.0)
Hemoglobin: 12.3 g/dL — ABNORMAL LOW (ref 13.0–17.0)
Lymphocytes Relative: 26.7 % (ref 12.0–46.0)
Lymphs Abs: 1.4 10*3/uL (ref 0.7–4.0)
MCHC: 32.5 g/dL (ref 30.0–36.0)
MCV: 80.4 fl (ref 78.0–100.0)
Monocytes Absolute: 0.6 10*3/uL (ref 0.1–1.0)
Monocytes Relative: 12.3 % — ABNORMAL HIGH (ref 3.0–12.0)
Neutro Abs: 2.9 10*3/uL (ref 1.4–7.7)
Neutrophils Relative %: 56.6 % (ref 43.0–77.0)
Platelets: 132 10*3/uL — ABNORMAL LOW (ref 150.0–400.0)
RBC: 4.71 Mil/uL (ref 4.22–5.81)
RDW: 15.6 % — ABNORMAL HIGH (ref 11.5–15.5)
WBC: 5.1 10*3/uL (ref 4.0–10.5)

## 2022-08-01 LAB — URINALYSIS, ROUTINE W REFLEX MICROSCOPIC
Bilirubin Urine: NEGATIVE
Hgb urine dipstick: NEGATIVE
Ketones, ur: NEGATIVE
Leukocytes,Ua: NEGATIVE
Nitrite: NEGATIVE
RBC / HPF: NONE SEEN (ref 0–?)
Specific Gravity, Urine: >=1.03 — AB (ref 1.000–1.030)
Total Protein, Urine: NEGATIVE
Urine Glucose: 500 — AB
Urobilinogen, UA: 0.2 (ref 0.0–1.0)
WBC, UA: NONE SEEN (ref 0–?)
pH: 5.5 (ref 5.0–8.0)

## 2022-08-01 LAB — HEPATIC FUNCTION PANEL
ALT: 16 U/L (ref 0–53)
AST: 13 U/L (ref 0–37)
Albumin: 4.6 g/dL (ref 3.5–5.2)
Alkaline Phosphatase: 73 U/L (ref 39–117)
Bilirubin, Direct: 0.1 mg/dL (ref 0.0–0.3)
Total Bilirubin: 0.6 mg/dL (ref 0.2–1.2)
Total Protein: 7.4 g/dL (ref 6.0–8.3)

## 2022-08-01 LAB — VITAMIN B12: Vitamin B-12: 1293 pg/mL — ABNORMAL HIGH (ref 211–911)

## 2022-08-01 LAB — LIPID PANEL
Cholesterol: 141 mg/dL (ref 0–200)
HDL: 77.9 mg/dL (ref >39.00)
LDL Cholesterol: 48 mg/dL (ref 0–99)
NonHDL: 62.68
Total CHOL/HDL Ratio: 2
Triglycerides: 71 mg/dL (ref 0.0–149.0)
VLDL: 14.2 mg/dL (ref 0.0–40.0)

## 2022-08-01 LAB — MICROALBUMIN / CREATININE URINE RATIO
Creatinine,U: 190.7 mg/dL
Microalb Creat Ratio: 1 mg/g (ref 0.0–30.0)
Microalb, Ur: 2 mg/dL — ABNORMAL HIGH (ref 0.0–1.9)

## 2022-08-01 LAB — BASIC METABOLIC PANEL
BUN: 14 mg/dL (ref 6–23)
CO2: 28 mEq/L (ref 19–32)
Calcium: 10.1 mg/dL (ref 8.4–10.5)
Chloride: 101 mEq/L (ref 96–112)
Creatinine, Ser: 0.88 mg/dL (ref 0.40–1.50)
GFR: 81.2 mL/min (ref >60.00)
Glucose, Bld: 186 mg/dL — ABNORMAL HIGH (ref 70–99)
Potassium: 4.2 mEq/L (ref 3.5–5.1)
Sodium: 137 mEq/L (ref 135–145)

## 2022-08-01 LAB — HEMOGLOBIN A1C: Hgb A1c MFr Bld: 6.7 % — ABNORMAL HIGH (ref 4.6–6.5)

## 2022-08-01 LAB — TSH: TSH: 1.54 u[IU]/mL (ref 0.35–5.50)

## 2022-08-01 LAB — VITAMIN D 25 HYDROXY (VIT D DEFICIENCY, FRACTURES): VITD: 34.83 ng/mL (ref 30.00–100.00)

## 2022-08-01 NOTE — Progress Notes (Signed)
Patient ID: Alex Massey, male   DOB: Sep 23, 1940, 82 y.o.   MRN: [id number]         Chief Complaint:: wellness exam and fluid in left knee  , DM, htn, hld, low B12 and Vit D       HPI:  Alex Massey is a 82 y.o. male here for wellness exam; declines covid booster, o/w up to date                        Also c/o walking 2.5 miles per day, now with 2 days onset suddent mod to severe left knee pain and swelling with some grinding noise.  No falls or giveaways.  Pt denies chest pain, increased sob or doe, wheezing, orthopnea, PND, increased LE swelling, palpitations, dizziness or syncope.   Pt denies polydipsia, polyuria, or new focal neuro s/s.    Pt denies fever, wt loss, night sweats, loss of appetite, or other constitutional symptoms     Wt Readings from Last 3 Encounters:  08/02/22 158 lb (71.7 kg)  08/02/22 160 lb (72.6 kg)  08/01/22 158 lb (71.7 kg)   BP Readings from Last 3 Encounters:  08/02/22 126/72  08/01/22 122/68  06/03/22 126/68   Immunization History  Administered Date(s) Administered   Fluad Quad(high Dose 65+) 04/06/2020, 04/04/2021   Influenza Split 04/09/2011, 04/13/2012   Influenza Whole 04/04/2008, 04/19/2009, 04/12/2010   Influenza, High Dose Seasonal PF 05/13/2013, 03/14/2016, 03/28/2017, 03/16/2018   Influenza,inj,Quad PF,6+ Mos 04/06/2014   Influenza-Unspecified 05/08/2019, 04/05/2022   Moderna Sars-Covid-2 Vaccination 09/03/2019, 11/10/2019, 04/18/2020   Pneumococcal Conjugate-13 09/12/2014   Pneumococcal Polysaccharide-23 01/02/2010   Td 01/02/2010   Tdap 11/27/2020   Zoster Recombinat (Shingrix) 02/05/2022, 04/07/2022   Zoster, Live 01/07/2012   Health Maintenance Due  Topic Date Due   COVID-19 Vaccine (4 - 2023-24 season) 03/08/2022      Past Medical History:  Diagnosis Date   Acquired right foot drop    post lumbar surgery,  wears brace   Allergy    B12 deficiency    Chronic constipation    diet   ED (erectile dysfunction)     History of kidney stones    Hyperlipidemia    Hyperplasia of Alex Massey with lower urinary tract symptoms (LUTS)    Hypertension    Insomnia    Neuropathy, peripheral    right foot   Presence of surgical incision 01/21/2019   s/p  left carpal tunnel release --  pt has dressing on,  denies pain or numbness   Alex Massey cancer [facility]) urologist-- dr eskridge/  oncologist-  dr Tammi Klippel   dx 08-04-2018,  Stage T1c, Gleason 4+4   Renal cyst, right    Type 2 diabetes mellitus ([place])    followed by pcp   Wears partial dentures    upper and lower   Past Surgical History:  Procedure Laterality Date   CARPAL TUNNEL RELEASE Bilateral left 01-21-2019;  right 1980s   CATARACT EXTRACTION Left 03/2020   CYSTOSCOPY N/A 01/26/2019   Procedure: CYSTOSCOPY FLEXIBLE;  Surgeon: Festus Aloe, MD;  Location: [facility];  Service: Urology;  Laterality: N/A;  no seeds found in bladder   LUMBAR [place]  L4-5;  1998 L4--S1   LUMBAR EPIDURAL INJECTION  2009, 2006   dr newton   LUMBAR MICRODISCECTOMY  05-13-2002  dr Jenean Lindau  '@[facility]'$    L4-5   RADIOACTIVE SEED IMPLANT N/A 01/26/2019   Procedure: RADIOACTIVE SEED IMPLANT/BRACHYTHERAPY  IMPLANT;  Surgeon: Festus Aloe, MD;  Location: [facility];  Service: Urology;  Laterality: N/A;    54 seeds implanted   SPACE OAR INSTILLATION N/A 01/26/2019   Procedure: SPACE OAR INSTILLATION;  Surgeon: Festus Aloe, MD;  Location: [facility];  Service: Urology;  Laterality: N/A;    reports that he quit smoking about 19 years ago. His smoking use included cigarettes. He has never used smokeless tobacco. He reports current alcohol use of about 3.0 standard drinks of alcohol per week. He reports that he does not use drugs. family history includes Colon cancer (age of onset: 42) in his brother; Diabetes in his father; Healthy in his mother; Alex Massey cancer in his brother, cousin, and nephew. Allergies  Allergen Reactions    Metformin And Related Other (See Comments)    GI upset, diarrhea, wt loss   Current Outpatient Medications on File Prior to Visit  Medication Sig Dispense Refill   acarbose (PRECOSE) 50 MG tablet TAKE 1 TABLET BY MOUTH 3 TIMES DAILY WITH MEALS 270 tablet 1   amLODipine (NORVASC) 5 MG tablet Take 1 tablet (5 mg total) by mouth daily. 90 tablet 3   aspirin EC 81 MG tablet Take 1 tablet (81 mg total) by mouth daily. 150 tablet 2   blood glucose meter kit and supplies KIT Inject 1 each into the skin 2 (two) times daily. Dispense based on patient and insurance preference. Use up to four times daily as directed. (FOR ICD-9 250.00, 250.01). 1 each 0   Blood Glucose Monitoring Suppl (FREESTYLE LITE) DEVI USE TO CHECK GLUCOSE UP TO 4 TIMES DAILY AS DIRECTED     Blood Glucose Monitoring Suppl (ONE TOUCH ULTRA 2) w/Device KIT Use to check bloods sugars twice a day 1 kit 0   Cholecalciferol (VITAMIN D) 50 MCG (2000 UT) CAPS Take 1 capsule by mouth daily.     glipiZIDE (GLUCOTROL XL) 10 MG 24 hr tablet TAKE 1 TABLET(10 MG) BY MOUTH DAILY WITH BREAKFAST 90 tablet 3   glucose blood (ONETOUCH ULTRA) test strip Use to check blood sugars twice a day 100 each 12   losartan (COZAAR) 50 MG tablet Take 1 tablet (50 mg total) by mouth in the morning and at bedtime. 180 tablet 2   meloxicam (MOBIC) 15 MG tablet TAKE 1 TABLET(15 MG) BY MOUTH DAILY 30 tablet 3   OneTouch Delica Lancets 47Q MISC Use to check blood sugars twice a day 100 each 1   oxybutynin (DITROPAN) 5 MG tablet Take 1 tablet (5 mg total) by mouth at bedtime. 90 tablet 0   simvastatin (ZOCOR) 20 MG tablet TAKE 1 TABLET BY MOUTH EVERY NIGHT AT BEDTIME 90 tablet 1   sitaGLIPtin (JANUVIA) 100 MG tablet Take 1 tablet (100 mg total) by mouth daily. 90 tablet 3   tamsulosin (FLOMAX) 0.4 MG CAPS capsule Take 0.4 mg by mouth daily.     triamcinolone (NASACORT) 55 MCG/ACT AERO nasal inhaler Place 2 sprays into the nose daily. 1 each 12   vitamin B-12  (CYANOCOBALAMIN) 1000 MCG tablet Take 1,000 mcg by mouth daily.     No current facility-administered medications on file prior to visit.        ROS:  All others reviewed and negative.  Objective        PE:  BP 122/68 (BP Location: Right Arm, Patient Position: Sitting, Cuff Size: Large)   Pulse 76   Temp 97.8 F (36.6 C) (Oral)   Ht '5\' 6"'$  (  1.676 m)   Wt 158 lb (71.7 kg)   SpO2 95%   BMI 25.50 kg/m                 Constitutional: Pt appears in NAD               HENT: Head: NCAT.                Right Ear: External ear normal.                 Left Ear: External ear normal.                Eyes: . Pupils are equal, round, and reactive to light. Conjunctivae and EOM are normal               Nose: without d/c or deformity               Neck: Neck supple. Gross normal ROM               Cardiovascular: Normal rate and regular rhythm.                 Pulmonary/Chest: Effort normal and breath sounds without rales or wheezing.                Abd:  Soft, NT, ND, + BS, no organomegaly               Neurological: Pt is alert. At baseline orientation, motor grossly intact               Skin: Skin is warm. No rashes, no other new lesions, LE edema - none                Left knee with 2+ warm swelling effusion with decreased ROM but no speicifc joint line tender               Psychiatric: Pt behavior is normal without agitation   Micro: none  Cardiac tracings I have personally interpreted today:  none  Pertinent Radiological findings (summarize): none   Lab Results  Component Value Date   WBC 5.1 08/01/2022   HGB 12.3 (L) 08/01/2022   HCT 37.9 (L) 08/01/2022   PLT 132.0 (L) 08/01/2022   GLUCOSE 186 (H) 08/01/2022   CHOL 141 08/01/2022   TRIG 71.0 08/01/2022   HDL 77.90 08/01/2022   LDLCALC 48 08/01/2022   ALT 16 08/01/2022   AST 13 08/01/2022   NA 137 08/01/2022   K 4.2 08/01/2022   CL 101 08/01/2022   CREATININE 0.88 08/01/2022   BUN 14 08/01/2022   CO2 28 08/01/2022   TSH  1.54 08/01/2022   PSA 0.01 (L) 11/27/2020   INR 1.0 01/22/2019   HGBA1C 6.7 (H) 08/01/2022   MICROALBUR 2.0 (H) 08/01/2022   Assessment/Plan:  RINALDO MACQUEEN is a 82 y.o. Black or African American [2] male with  has a past medical history of Acquired right foot drop, Allergy, B12 deficiency, Chronic constipation, ED (erectile dysfunction), History of kidney stones, Hyperlipidemia, Hyperplasia of Alex Massey with lower urinary tract symptoms (LUTS), Hypertension, Insomnia, Neuropathy, peripheral, Presence of surgical incision (01/21/2019), Alex Massey cancer [facility]) (urologist-- dr eskridge/  oncologist-  dr Tammi Klippel), Renal cyst, right, Type 2 diabetes mellitus ([place]), and Wears partial dentures.  Encounter for well adult exam with abnormal findings Age and sex appropriate education and counseling updated with regular exercise and diet Referrals for preventative services - none needed Immunizations addressed -  declines covid booster Smoking counseling  - none needed Evidence for depression or other mood disorder - none significant Most recent labs reviewed. I have personally reviewed and have noted: 1) the patient's medical and social history 2) The patient's current medications and supplements 3) The patient's height, weight, and BMI have been recorded in the chart   Diabetes [facility]) Lab Results  Component Value Date   HGBA1C 6.7 (H) 08/01/2022   Stable, pt to continue current medical treatment precose 50 tid, glucotrol xl 10 qd, januvia 100 qd,   Essential hypertension BP Readings from Last 3 Encounters:  08/02/22 126/72  08/01/22 122/68  06/03/22 126/68   Stable, pt to continue medical treatment norvasc 5 mg qd, losartan 50 qd   Hyperlipidemia Lab Results  Component Value Date   LDLCALC 48 08/01/2022   Stable, pt to continue current statin zocor 20 qd   Pain and swelling of left knee Acute new onset swelling - liekly mod to severe DJD flare - for volt gel prn, icepack prn,  and refer sports medicine  Vitamin B12 deficiency Lab Results  Component Value Date   VITAMINB12 1,293 (H) 08/01/2022   Stable, cont oral replacement - b12 1000 mcg qd   Vitamin D deficiency Last vitamin D Lab Results  Component Value Date   VD25OH 34.83 08/01/2022   Low, to start oral replacement  Followup: Return in about 4 months (around 12/03/2022).  Cathlean Cower, MD 08/04/2022 7:20 PM [facility]

## 2022-08-01 NOTE — Patient Instructions (Addendum)
Please continue all other medications as before, and refills have been done if requested.  Please have the pharmacy call with any other refills you may need.  Please continue your efforts at being more active, low cholesterol diet, and weight control.  You are otherwise up to date with prevention measures today.  Please keep your appointments with your specialists as you may have planned  Please stop at the first floor to see if you can see Sports Medicine for the left knee  Please go to the LAB at the blood drawing area for the tests to be done - at the lab on the first floor  You will be contacted by phone if any changes need to be made immediately.  Otherwise, you will receive a letter about your results with an explanation, but please check with MyChart first.  Please remember to sign up for MyChart if you have not done so, as this will be important to you in the future with finding out test results, communicating by private email, and scheduling acute appointments online when needed.  Please make an Appointment to return in May 28, or sooner if needed,

## 2022-08-02 ENCOUNTER — Ambulatory Visit: Payer: Medicare Other | Admitting: Family Medicine

## 2022-08-02 ENCOUNTER — Ambulatory Visit (INDEPENDENT_AMBULATORY_CARE_PROVIDER_SITE_OTHER): Payer: Medicare Other

## 2022-08-02 ENCOUNTER — Ambulatory Visit: Payer: Self-pay

## 2022-08-02 VITALS — Ht 66.0 in | Wt 158.0 lb

## 2022-08-02 VITALS — BP 126/72 | HR 78 | Ht 66.0 in | Wt 160.0 lb

## 2022-08-02 DIAGNOSIS — M25462 Effusion, left knee: Secondary | ICD-10-CM

## 2022-08-02 DIAGNOSIS — M21371 Foot drop, right foot: Secondary | ICD-10-CM

## 2022-08-02 DIAGNOSIS — R269 Unspecified abnormalities of gait and mobility: Secondary | ICD-10-CM | POA: Diagnosis not present

## 2022-08-02 DIAGNOSIS — Z0000 Encounter for general adult medical examination without abnormal findings: Secondary | ICD-10-CM

## 2022-08-02 DIAGNOSIS — M25562 Pain in left knee: Secondary | ICD-10-CM

## 2022-08-02 DIAGNOSIS — M1712 Unilateral primary osteoarthritis, left knee: Secondary | ICD-10-CM | POA: Diagnosis not present

## 2022-08-02 NOTE — Patient Instructions (Addendum)
Thank you for coming in today.   Please get an Xray today before you leave   You received an injection today. Seek immediate medical attention if the joint becomes red, extremely painful, or is oozing fluid.   Let me know how this goes.   If your gait does not improve we should do more.

## 2022-08-02 NOTE — Progress Notes (Signed)
Virtual Visit via Telephone Note  I connected with  Alex Massey on 08/02/22 at  2:00 PM EST by telephone and verified that I am speaking with the correct person using two identifiers.  Location: Patient: Home Provider: [place] Persons participating in the virtual visit: [place]   I discussed the limitations, risks, security and privacy concerns of performing an evaluation and management service by telephone and the availability of in person appointments. The patient expressed understanding and agreed to proceed.  Interactive audio and video telecommunications were attempted between this nurse and patient, however failed, due to patient having technical difficulties OR patient did not have access to video capability.  We continued and completed visit with audio only.  Some vital signs may be absent or patient reported.   Sheral Flow, LPN  Subjective:   Alex Massey is a 82 y.o. male who presents for Medicare Annual/Subsequent preventive examination.  Review of Systems     Cardiac Risk Factors include: advanced age (>76mn, >>71women);diabetes mellitus;dyslipidemia;hypertension;male gender     Objective:    Today's Vitals   08/02/22 1404  Weight: 158 lb (71.7 kg)  Height: '5\' 6"'$  (1.676 m)  PainSc: 0-No pain   Body mass index is 25.5 kg/m.     08/02/2022    2:11 PM 07/20/2021    8:29 AM 03/27/2021    7:20 AM 05/31/2020    8:20 AM 01/26/2019   11:50 AM 01/20/2015    8:40 AM 01/06/2015    8:12 AM  Advanced Directives  Does Patient Have a Medical Advance Directive? Yes Yes No No Yes No No  Type of [place] will   Living will    Does patient want to make changes to medical advance directive?     Yes (MAU/Ambulatory/Procedural Areas - Information given)    Copy of HWiconsicoin Chart? No - copy requested        Would patient like information on creating a  medical advance directive?   No - Patient declined No - Patient declined  No - patient declined information No - patient declined information    Current Medications (verified) Outpatient Encounter Medications as of 08/02/2022  Medication Sig   acarbose (PRECOSE) 50 MG tablet TAKE 1 TABLET BY MOUTH 3 TIMES DAILY WITH MEALS   amLODipine (NORVASC) 5 MG tablet Take 1 tablet (5 mg total) by mouth daily.   aspirin EC 81 MG tablet Take 1 tablet (81 mg total) by mouth daily.   blood glucose meter kit and supplies KIT Inject 1 each into the skin 2 (two) times daily. Dispense based on patient and insurance preference. Use up to four times daily as directed. (FOR ICD-9 250.00, 250.01).   Blood Glucose Monitoring Suppl (FREESTYLE LITE) DEVI USE TO CHECK GLUCOSE UP TO 4 TIMES DAILY AS DIRECTED   Blood Glucose Monitoring Suppl (ONE TOUCH ULTRA 2) w/Device KIT Use to check bloods sugars twice a day   Cholecalciferol (VITAMIN D) 50 MCG (2000 UT) CAPS Take 1 capsule by mouth daily.   glipiZIDE (GLUCOTROL XL) 10 MG 24 hr tablet TAKE 1 TABLET(10 MG) BY MOUTH DAILY WITH BREAKFAST   glucose blood (ONETOUCH ULTRA) test strip Use to check blood sugars twice a day   losartan (COZAAR) 50 MG tablet Take 1 tablet (50 mg total) by mouth in the morning and at bedtime.   meloxicam (MOBIC) 15 MG tablet TAKE 1 TABLET(15 MG) BY MOUTH  DAILY   OneTouch Delica Lancets 74J MISC Use to check blood sugars twice a day   oxybutynin (DITROPAN) 5 MG tablet Take 1 tablet (5 mg total) by mouth at bedtime.   simvastatin (ZOCOR) 20 MG tablet TAKE 1 TABLET BY MOUTH EVERY NIGHT AT BEDTIME   sitaGLIPtin (JANUVIA) 100 MG tablet Take 1 tablet (100 mg total) by mouth daily.   tamsulosin (FLOMAX) 0.4 MG CAPS capsule Take 0.4 mg by mouth daily.   triamcinolone (NASACORT) 55 MCG/ACT AERO nasal inhaler Place 2 sprays into the nose daily.   vitamin B-12 (CYANOCOBALAMIN) 1000 MCG tablet Take 1,000 mcg by mouth daily.   No facility-administered  encounter medications on file as of 08/02/2022.    Allergies (verified) Metformin and related   History: Past Medical History:  Diagnosis Date   Acquired right foot drop    post lumbar surgery,  wears brace   Allergy    B12 deficiency    Chronic constipation    diet   ED (erectile dysfunction)    History of kidney stones    Hyperlipidemia    Hyperplasia of prostate with lower urinary tract symptoms (LUTS)    Hypertension    Insomnia    Neuropathy, peripheral    right foot   Presence of surgical incision 01/21/2019   s/p  left carpal tunnel release --  pt has dressing on,  denies pain or numbness   Prostate cancer [facility]) urologist-- dr eskridge/  oncologist-  dr Tammi Klippel   dx 08-04-2018,  Stage T1c, Gleason 4+4   Renal cyst, right    Type 2 diabetes mellitus ([place])    followed by pcp   Wears partial dentures    upper and lower   Past Surgical History:  Procedure Laterality Date   CARPAL TUNNEL RELEASE Bilateral left 01-21-2019;  right 1980s   CATARACT EXTRACTION Left 03/2020   CYSTOSCOPY N/A 01/26/2019   Procedure: CYSTOSCOPY FLEXIBLE;  Surgeon: Festus Aloe, MD;  Location: [facility];  Service: Urology;  Laterality: N/A;  no seeds found in bladder   [place]  L4-5;  1998 L4--S1   LUMBAR EPIDURAL INJECTION  2009, 2006   dr Ernestina Patches   LUMBAR MICRODISCECTOMY  05-13-2002  dr Jenean Lindau  '@[facility]'$    L4-5   RADIOACTIVE SEED IMPLANT N/A 01/26/2019   Procedure: RADIOACTIVE SEED IMPLANT/BRACHYTHERAPY IMPLANT;  Surgeon: Festus Aloe, MD;  Location: [facility];  Service: Urology;  Laterality: N/A;    54 seeds implanted   SPACE OAR INSTILLATION N/A 01/26/2019   Procedure: SPACE OAR INSTILLATION;  Surgeon: Festus Aloe, MD;  Location: [facility];  Service: Urology;  Laterality: N/A;   Family History  Problem Relation Age of Onset   Healthy Mother    Diabetes Father    Prostate cancer Brother    Colon cancer Brother  78   Prostate cancer Cousin        maternal   Prostate cancer Nephew    Rectal cancer Neg Hx    Stomach cancer Neg Hx    Social History   Socioeconomic History   Marital status: Married    Spouse name: Not on file   Number of children: 2   Years of education: 14   Highest education level: Not on file  Occupational History    Comment: retired  Tobacco Use   Smoking status: Former    Years: 20.00    Types: Cigarettes    Quit date: 01/25/2003  Years since quitting: 19.5   Smokeless tobacco: Never  Vaping Use   Vaping Use: Never used  Substance and Sexual Activity   Alcohol use: Yes    Alcohol/week: 3.0 standard drinks of alcohol    Types: 3 Glasses of wine per week   Drug use: Never   Sexual activity: Not on file  Other Topics Concern   Not on file  Social History Narrative   HSG 2 years trade school. married 1965. 2 daughters- '66, '71. 2 grandchldren. 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   Social Determinants of Health   Financial Resource Strain: Low Risk  (08/02/2022)   Overall Financial Resource Strain (CARDIA)    Difficulty of Paying Living Expenses: Not hard at all  Food Insecurity: No Food Insecurity (08/02/2022)   Hunger Vital Sign    Worried About Running Out of Food in the Last Year: Never true    Ran Out of Food in the Last Year: Never true  Transportation Needs: No Transportation Needs (08/02/2022)   PRAPARE - Hydrologist (Medical): No    Lack of Transportation (Non-Medical): No  Physical Activity: Sufficiently Active (08/02/2022)   Exercise Vital Sign    Days of Exercise per Week: 5 days    Minutes of Exercise per Session: 40 min  Stress: No Stress Concern Present (08/02/2022)   [place]    Feeling of Stress : Not at all  Social Connections: Moderately Integrated (08/02/2022)   Social Connection and Isolation Panel [NHANES]     Frequency of Communication with Friends and Family: Twice a week    Frequency of Social Gatherings with Friends and Family: Twice a week    Attends Religious Services: More than 4 times per year    Active Member of [employer] or Organizations: No    Attends Music therapist: Never    Marital Status: Married    Tobacco Counseling Counseling given: Not Answered   Clinical Intake:  Pre-visit preparation completed: Yes  Pain : No/denies pain Pain Score: 0-No pain     BMI - recorded: 25.5 Nutritional Status: BMI 25 -29 Overweight Nutritional Risks: None Diabetes: Yes CBG done?: No Did pt. bring in CBG monitor from home?: No  How often do you need to have someone help you when you read instructions, pamphlets, or other written materials from your doctor or pharmacy?: 1 - Never What is the last grade level you completed in school?: HSG  Nutrition Risk Assessment:  Has the patient had any N/V/D within the last 2 months?  No  Does the patient have any non-healing wounds?  No  Has the patient had any unintentional weight loss or weight gain?  No   Diabetes:  Is the patient diabetic?  Yes  If diabetic, was a CBG obtained today?  No  Did the patient bring in their glucometer from home?  No  How often do you monitor your CBG's? 2 times per day.   Financial Strains and Diabetes Management:  Are you having any financial strains with the device, your supplies or your medication? No .  Does the patient want to be seen by Chronic Care Management for management of their diabetes?  No  Would the patient like to be referred to a Nutritionist or for Diabetic Management?  No   Diabetic Exams:  Diabetic Eye Exam: Completed 02/18/2022 Diabetic Foot Exam: Completed 12/04/2021   Interpreter Needed?: No  Information entered by :: Lisette Abu, LPN.   Activities of Daily Living    08/02/2022    2:12 PM  In your present state of health, do you have any difficulty  performing the following activities:  Hearing? 0  Vision? 0  Difficulty concentrating or making decisions? 0  Walking or climbing stairs? 0  Dressing or bathing? 0  Doing errands, shopping? 0  Preparing Food and eating ? N  Using the Toilet? N  In the past six months, have you accidently leaked urine? N  Do you have problems with loss of bowel control? N  Managing your Medications? N  Managing your Finances? N  Housekeeping or managing your Housekeeping? N    Patient Care Team: Biagio Borg, MD as PCP - General (Internal Medicine) Newt Minion, MD (Orthopedic Surgery) Lafayette Dragon, MD (Inactive) (Gastroenterology) Marygrace Drought, MD (Ophthalmology) Magnus Sinning, MD (Physical Medicine and Rehabilitation) [place], Darnelle Maffucci, [facility] (Inactive) as Pharmacist (Pharmacist)  Indicate any recent Medical Services you may have received from other than [facility] providers in the past year (date may be approximate).     Assessment:   This is a routine wellness examination for [facility].  Hearing/Vision screen Hearing Screening - Comments:: Denies hearing difficulties   Vision Screening - Comments:: Wears reading glasses.  Eye exam done every August by Marygrace Drought, MD.  Dietary issues and exercise activities discussed: Current Exercise Habits: Home exercise routine, Type of exercise: walking, Time (Minutes): 30, Frequency (Times/Week): 5, Weekly Exercise (Minutes/Week): 150, Intensity: Moderate, Exercise limited by: None identified   Goals Addressed             This Visit's Progress    My goal for 2024 is to stay blessed, independent, physically active and socially involved.        Depression Screen    08/02/2022    2:11 PM 08/01/2022    2:56 PM 08/01/2022    2:28 PM 12/04/2021   11:35 AM 12/04/2021   11:28 AM 07/20/2021    8:32 AM 07/20/2021    8:28 AM  PHQ 2/9 Scores  PHQ - 2 Score 0 0 0 0 0 0 0  PHQ- 9 Score 0  0  0      Fall Risk    08/02/2022    2:12 PM 08/01/2022     2:56 PM 08/01/2022    2:29 PM 12/04/2021   11:35 AM 12/04/2021   11:28 AM  [place] in the past year? 0 0 0 0 0  Number falls in past yr: 0 0 0 0 0  Injury with Fall? 0 0 0 0 0  Risk for fall due to : No Fall Risks  No Fall Risks    Follow up Falls prevention discussed  Falls evaluation completed      FALL RISK PREVENTION PERTAINING TO THE HOME:  Any stairs in or around the home? No  If so, are there any without handrails? No  Home free of loose throw rugs in walkways, pet beds, electrical cords, etc? Yes  Adequate lighting in your home to reduce risk of falls? Yes   ASSISTIVE DEVICES UTILIZED TO PREVENT FALLS:  Life alert? No  Use of a cane, walker or w/c? No  Grab bars in the bathroom? No  Shower chair or bench in shower? No  Elevated toilet seat or a handicapped toilet? No   TIMED UP AND GO:  Was the test performed? No . Phone Visit  Cognitive  Function:        08/02/2022    2:12 PM  6CIT Screen  What Year? 0 points  What month? 0 points  What time? 0 points  Count back from 20 0 points  Months in reverse 0 points  Repeat phrase 0 points  Total Score 0 points    Immunizations Immunization History  Administered Date(s) Administered   Fluad Quad(high Dose 65+) 04/06/2020, 04/04/2021   Influenza Split 04/09/2011, 04/13/2012   Influenza Whole 04/04/2008, 04/19/2009, 04/12/2010   Influenza, High Dose Seasonal PF 05/13/2013, 03/14/2016, 03/28/2017, 03/16/2018   Influenza,inj,Quad PF,6+ Mos 04/06/2014   Influenza-Unspecified 05/08/2019, 04/05/2022   Moderna Sars-Covid-2 Vaccination 09/03/2019, 11/10/2019, 04/18/2020   Pneumococcal Conjugate-13 09/12/2014   Pneumococcal Polysaccharide-23 01/02/2010   Td 01/02/2010   Tdap 11/27/2020   Zoster Recombinat (Shingrix) 02/05/2022, 04/07/2022   Zoster, Live 01/07/2012    TDAP status: Up to date  Flu Vaccine status: Up to date  Pneumococcal vaccine status: Up to date  Covid-19 vaccine status: Completed  vaccines  Qualifies for Shingles Vaccine? Yes   Zostavax completed Yes   Shingrix Completed?: Yes  Screening Tests Health Maintenance  Topic Date Due   COVID-19 Vaccine (4 - 2023-24 season) 03/08/2022   FOOT EXAM  12/05/2022   HEMOGLOBIN A1C  01/30/2023   OPHTHALMOLOGY EXAM  02/19/2023   Diabetic kidney evaluation - eGFR measurement  08/02/2023   Diabetic kidney evaluation - Urine ACR  08/02/2023   Medicare Annual Wellness (AWV)  08/03/2023   COLONOSCOPY (Pts 45-57yr Insurance coverage will need to be confirmed)  03/27/2026   DTaP/Tdap/Td (3 - Td or Tdap) 11/28/2030   Pneumonia Vaccine 82 Years old  Completed   INFLUENZA VACCINE  Completed   Zoster Vaccines- Shingrix  Completed   HPV VACCINES  Aged Out    Health Maintenance  Health Maintenance Due  Topic Date Due   COVID-19 Vaccine (4 - 2023-24 season) 03/08/2022    Colorectal cancer screening: Type of screening: Colonoscopy. Completed 03/27/2021. Repeat every 5 years  Lung Cancer Screening: (Low Dose CT Chest recommended if Age 81-80years, 30 pack-year currently smoking OR have quit w/in 15years.) does not qualify.   Lung Cancer Screening Referral: no  Additional Screening:  Hepatitis C Screening: does not qualify; Completed no  Vision Screening: Recommended annual ophthalmology exams for early detection of glaucoma and other disorders of the eye. Is the patient up to date with their annual eye exam?  Yes  Who is the provider or what is the name of the office in which the patient attends annual eye exams? MMarygrace Drought MD. If pt is not established with a provider, would they like to be referred to a provider to establish care? No .   Dental Screening: Recommended annual dental exams for proper oral hygiene  Community Resource Referral / Chronic Care Management: CRR required this visit?  No   CCM required this visit?  No      Plan:     I have personally reviewed and noted the following in the patient's  chart:   Medical and social history Use of alcohol, tobacco or illicit drugs  Current medications and supplements including opioid prescriptions. Patient is not currently taking opioid prescriptions. Functional ability and status Nutritional status Physical activity Advanced directives List of other physicians Hospitalizations, surgeries, and ER visits in previous 12 months Vitals Screenings to include cognitive, depression, and falls Referrals and appointments  In addition, I have reviewed and discussed with patient certain preventive protocols, quality metrics,  and best practice recommendations. A written personalized care plan for preventive services as well as general preventive health recommendations were provided to patient.     Sheral Flow, LPN   01/05/1600   Nurse Notes: N/A

## 2022-08-02 NOTE — Progress Notes (Signed)
I, Peterson Lombard, LAT, ATC acting as a scribe for Lynne Leader, MD.  Subjective:    CC: L knee pain  HPI: Pt is an 82 y/o male c/o L knee pain ongoing since Monday. Pt tries to walk 2.5 miles every day. Pt was doing his normal daily walk when he noticed the L knee pain. Of note, pt has been Dr. Ernestina Patches in the past for lumbar radiculopathy. Pt doesn't have a specific are of pain. Pt is dragging his feet w/ a shuffled gait.  He notes he has a history of a mild right foot drop that he sometimes uses an ankle-foot orthosis for.  He is not currently using the AFO right leg.  L Knee swelling: yes- has improved some Mechanical symptoms: yes Aggravates: walking Treatments tried: cream, rest  Pertinent review of Systems: No fevers or chills  Relevant historical information: History of prostate cancer.  History of diabetes.  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  He has an interesting and brave life story.   Objective:    Vitals:   08/02/22 0925  BP: 126/72  Pulse: 78  SpO2: 97%   General: Well Developed, well nourished, and in no acute distress.   MSK: Left knee: Large joint effusion otherwise normal-appearing Normal motion with crepitations.  Tender palpation to the joint line. Stable ligamentous exam.  Gait: Shuffling gait moderately improved following aspiration below.  Right lower extremity: Mild weakness to foot dorsiflexion.  Neuro: Broad shuffling gait with en bloc turn.  No resting tremor visible.  Lab and Radiology Results  Procedure: Real-time Ultrasound Guided aspiration and injection of left knee effusion Device: Philips Affiniti 50G Images permanently stored and available for review in PACS Ultrasound evaluation prior to injection reveals a large joint effusion.  Degenerative changes present medial and lateral joint line. Verbal informed consent obtained.  Discussed risks and  benefits of procedure. Warned about infection, bleeding, hyperglycemia damage to structures among others. Patient expresses understanding and agreement Time-out conducted.   Noted no overlying erythema, induration, or other signs of local infection.   Skin prepped in a sterile fashion.   Local anesthesia: Topical Ethyl chloride.   With sterile technique and under real time ultrasound guidance: 3 mL of lidocaine injected into subcutaneous tissue superior lateral knee achieving good anesthesia. Skin sterilized with isopropyl alcohol again. 18-gauge needle used to access the knee joint and 60 mL of clear yellow fluid aspirated. Syringe was exchanged and 40 mg of Kenalog and 2 mL of Marcaine was injected into the knee joint. Joint effusion seen to be decompressed on ultrasound following aspiration. Completed without difficulty   Pain immediately resolved suggesting accurate placement of the medication.   Advised to call if fevers/chills, erythema, induration, drainage, or persistent bleeding.   Images permanently stored and available for review in the ultrasound unit.  Impression: Technically successful ultrasound guided injection.   X-ray images of knee obtained today personally and independently interpreted. Mild medial compartment DJD.  Chondrocalcinosis visible at lateral knee compartment.  No acute fractures are visible.  X-ray not significantly changed compared to prior x-ray dated 2019. Await formal radiology review     Impression and Recommendations:    Assessment and Plan: 82 y.o. male with left knee pain and swelling.  The underlying etiology is unclear.  He does not report an injury to explain this.  He does have some arthritis changes seen on x-ray and does  have chondrocalcinosis.  Exacerbation of DJD or pseudogout is definitely a possibility.  Knee effusion was aspirated and injected with steroids today.  Will send the knee aspirate for fluid analysis including cell count  differential and crystal analysis as well as culture.  The removal of the fluid and steroid injection should help quite a bit.  I am worried about his gait.  It could be simply that he has a mild foot drop that he is not using his AFO for and his left knee is painful and swollen and not working well.  However if his gait does not improve he really should benefit from physical therapy for fall prevention and perhaps a neurology referral for Parkinson's evaluation.  He has a somewhat broad shuffling gait.Marland Kitchen  PDMP not reviewed this encounter. Orders Placed This Encounter  Procedures   Anaerobic and Aerobic Culture    Standing Status:   Future    Standing Expiration Date:   08/03/2023   DG Knee AP/LAT W/[place] Left    Standing Status:   Future    Number of Occurrences:   1    Standing Expiration Date:   09/02/2022    Order Specific Question:   Reason for Exam (SYMPTOM  OR DIAGNOSIS REQUIRED)    Answer:   left knee pain    Order Specific Question:   Preferred imaging location?    Answer:   [facility]   [name] LIMITED JOINT SPACE STRUCTURES LOW LEFT(NO LINKED CHARGES)    Order Specific Question:   Reason for Exam (SYMPTOM  OR DIAGNOSIS REQUIRED)    Answer:   left knee pain    Order Specific Question:   Preferred imaging location?    Answer:   [place]   Synovial Fluid Analysis, Complete    Standing Status:   Future    Standing Expiration Date:   08/03/2023   No orders of the defined types were placed in this encounter.   Discussed warning signs or symptoms. Please see discharge instructions. Patient expresses understanding.   The above documentation has been reviewed and is accurate and complete Lynne Leader, M.D.

## 2022-08-02 NOTE — Patient Instructions (Signed)
Alex Massey , Thank you for taking time to come for your Medicare Wellness Visit. I appreciate your ongoing commitment to your health goals. Please review the following plan we discussed and let me know if I can assist you in the future.   These are the goals we discussed:  Goals      My goal for 2024 is to stay blessed, independent, physically active and socially involved.        This is a list of the screening recommended for you and due dates:  Health Maintenance  Topic Date Due   COVID-19 Vaccine (4 - 2023-24 season) 03/08/2022   Complete foot exam   12/05/2022   Hemoglobin A1C  01/30/2023   Eye exam for diabetics  02/19/2023   Yearly kidney function blood test for diabetes  08/02/2023   Yearly kidney health urinalysis for diabetes  08/02/2023   Medicare Annual Wellness Visit  08/03/2023   Colon Cancer Screening  03/27/2026   DTaP/Tdap/Td vaccine (3 - Td or Tdap) 11/28/2030   Pneumonia Vaccine  Completed   Flu Shot  Completed   Zoster (Shingles) Vaccine  Completed   HPV Vaccine  Aged Out    Advanced directives: Yes  Conditions/risks identified: Yes; Type II Diabetes Mellitus  Next appointment: Follow up in one year for your annual wellness visit.   Preventive Care 81 Years and Older, Male  Preventive care refers to lifestyle choices and visits with your health care provider that can promote health and wellness. What does preventive care include? A yearly physical exam. This is also called an annual well check. Dental exams once or twice a year. Routine eye exams. Ask your health care provider how often you should have your eyes checked. Personal lifestyle choices, including: Daily care of your teeth and gums. Regular physical activity. Eating a healthy diet. Avoiding tobacco and drug use. Limiting alcohol use. Practicing safe sex. Taking low doses of aspirin every day. Taking vitamin and mineral supplements as recommended by your health care provider. What  happens during an annual well check? The services and screenings done by your health care provider during your annual well check will depend on your age, overall health, lifestyle risk factors, and family history of disease. Counseling  Your health care provider may ask you questions about your: Alcohol use. Tobacco use. Drug use. Emotional well-being. Home and relationship well-being. Sexual activity. Eating habits. History of falls. Memory and ability to understand (cognition). Work and work Statistician. Screening  You may have the following tests or measurements: Height, weight, and BMI. Blood pressure. Lipid and cholesterol levels. These may be checked every 5 years, or more frequently if you are over 17 years old. Skin check. Lung cancer screening. You may have this screening every year starting at age 18 if you have a 30-pack-year history of smoking and currently smoke or have quit within the past 15 years. Fecal occult blood test (FOBT) of the stool. You may have this test every year starting at age 24. Flexible sigmoidoscopy or colonoscopy. You may have a sigmoidoscopy every 5 years or a colonoscopy every 10 years starting at age 81. Prostate cancer screening. Recommendations will vary depending on your family history and other risks. Hepatitis C blood test. Hepatitis B blood test. Sexually transmitted disease (STD) testing. Diabetes screening. This is done by checking your blood sugar (glucose) after you have not eaten for a while (fasting). You may have this done every 1-3 years. Abdominal aortic aneurysm (AAA) screening. You may  need this if you are a current or former smoker. Osteoporosis. You may be screened starting at age 36 if you are at high risk. Talk with your health care provider about your test results, treatment options, and if necessary, the need for more tests. Vaccines  Your health care provider may recommend certain vaccines, such as: Influenza vaccine. This  is recommended every year. Tetanus, diphtheria, and acellular pertussis (Tdap, Td) vaccine. You may need a Td booster every 10 years. Zoster vaccine. You may need this after age 81. Pneumococcal 13-valent conjugate (PCV13) vaccine. One dose is recommended after age 81. Pneumococcal polysaccharide (PPSV23) vaccine. One dose is recommended after age 81. Talk to your health care provider about which screenings and vaccines you need and how often you need them. This information is not intended to replace advice given to you by your health care provider. Make sure you discuss any questions you have with your health care provider. Document Released: 07/21/2015 Document Revised: 03/13/2016 Document Reviewed: 04/25/2015 Elsevier Interactive Patient Education  2017 [place] Prevention in the Home Falls can cause injuries. They can happen to people of all ages. There are many things you can do to make your home safe and to help prevent falls. What can I do on the outside of my home? Regularly fix the edges of walkways and driveways and fix any cracks. Remove anything that might make you trip as you walk through a door, such as a raised step or threshold. Trim any bushes or trees on the path to your home. Use bright outdoor lighting. Clear any walking paths of anything that might make someone trip, such as rocks or tools. Regularly check to see if handrails are loose or broken. Make sure that both sides of any steps have handrails. Any raised decks and porches should have guardrails on the edges. Have any leaves, snow, or ice cleared regularly. Use sand or salt on walking paths during winter. Clean up any spills in your garage right away. This includes oil or grease spills. What can I do in the bathroom? Use night lights. Install grab bars by the toilet and in the tub and shower. Do not use towel bars as grab bars. Use non-skid mats or decals in the tub or shower. If you need to sit down  in the shower, use a plastic, non-slip stool. Keep the floor dry. Clean up any water that spills on the floor as soon as it happens. Remove soap buildup in the tub or shower regularly. Attach bath mats securely with double-sided non-slip rug tape. Do not have throw rugs and other things on the floor that can make you trip. What can I do in the bedroom? Use night lights. Make sure that you have a light by your bed that is easy to reach. Do not use any sheets or blankets that are too big for your bed. They should not hang down onto the floor. Have a firm chair that has side arms. You can use this for support while you get dressed. Do not have throw rugs and other things on the floor that can make you trip. What can I do in the kitchen? Clean up any spills right away. Avoid walking on wet floors. Keep items that you use a lot in easy-to-reach places. If you need to reach something above you, use a strong step stool that has a grab bar. Keep electrical cords out of the way. Do not use floor polish or wax that makes  floors slippery. If you must use wax, use non-skid floor wax. Do not have throw rugs and other things on the floor that can make you trip. What can I do with my stairs? Do not leave any items on the stairs. Make sure that there are handrails on both sides of the stairs and use them. Fix handrails that are broken or loose. Make sure that handrails are as long as the stairways. Check any carpeting to make sure that it is firmly attached to the stairs. Fix any carpet that is loose or worn. Avoid having throw rugs at the top or bottom of the stairs. If you do have throw rugs, attach them to the floor with carpet tape. Make sure that you have a light switch at the top of the stairs and the bottom of the stairs. If you do not have them, ask someone to add them for you. What else can I do to help prevent falls? Wear shoes that: Do not have high heels. Have rubber bottoms. Are comfortable  and fit you well. Are closed at the toe. Do not wear sandals. If you use a stepladder: Make sure that it is fully opened. Do not climb a closed stepladder. Make sure that both sides of the stepladder are locked into place. Ask someone to hold it for you, if possible. Clearly mark and make sure that you can see: Any grab bars or handrails. First and last steps. Where the edge of each step is. Use tools that help you move around (mobility aids) if they are needed. These include: Canes. Walkers. Scooters. Crutches. Turn on the lights when you go into a dark area. Replace any light bulbs as soon as they burn out. Set up your furniture so you have a clear path. Avoid moving your furniture around. If any of your floors are uneven, fix them. If there are any pets around you, be aware of where they are. Review your medicines with your doctor. Some medicines can make you feel dizzy. This can increase your chance of falling. Ask your doctor what other things that you can do to help prevent falls. This information is not intended to replace advice given to you by your health care provider. Make sure you discuss any questions you have with your health care provider. Document Released: 04/20/2009 Document Revised: 11/30/2015 Document Reviewed: 07/29/2014 Elsevier Interactive Patient Education  2017 [employer].

## 2022-08-04 ENCOUNTER — Encounter: Payer: Self-pay | Admitting: Internal Medicine

## 2022-08-04 NOTE — Assessment & Plan Note (Signed)
BP Readings from Last 3 Encounters:  08/02/22 126/72  08/01/22 122/68  06/03/22 126/68   Stable, pt to continue medical treatment norvasc 5 mg qd, losartan 50 qd

## 2022-08-04 NOTE — Assessment & Plan Note (Signed)
Acute new onset swelling - liekly mod to severe DJD flare - for volt gel prn, icepack prn, and refer sports medicine

## 2022-08-04 NOTE — Assessment & Plan Note (Signed)
Lab Results  Component Value Date   VITAMINB12 1,293 (H) 08/01/2022   Stable, cont oral replacement - b12 1000 mcg qd

## 2022-08-04 NOTE — Assessment & Plan Note (Signed)
Lab Results  Component Value Date   HGBA1C 6.7 (H) 08/01/2022   Stable, pt to continue current medical treatment precose 50 tid, glucotrol xl 10 qd, januvia 100 qd,

## 2022-08-04 NOTE — Assessment & Plan Note (Signed)
Lab Results  Component Value Date   LDLCALC 48 08/01/2022   Stable, pt to continue current statin zocor 20 qd

## 2022-08-04 NOTE — Assessment & Plan Note (Signed)

## 2022-08-04 NOTE — Assessment & Plan Note (Signed)
Last vitamin D Lab Results  Component Value Date   VD25OH 34.83 08/01/2022   Low, to start oral replacement

## 2022-08-05 NOTE — Progress Notes (Signed)
Left knee x-ray shows some arthritis changes.

## 2022-08-05 NOTE — Progress Notes (Signed)
Fluid analysis shows no crystals.  Pseudogout is less likely.  No sign of infection so far.

## 2022-08-09 LAB — SYNOVIAL FLUID ANALYSIS, COMPLETE
Basophils, %: 0 %
Eosinophils-Synovial: 0 % (ref 0–2)
Lymphocytes-Synovial Fld: 17 % (ref 0–74)
Monocyte/Macrophage: 3 % (ref 0–69)
Neutrophil, Synovial: 80 % — ABNORMAL HIGH (ref 0–24)
Synoviocytes, %: 0 % (ref 0–15)
WBC, Synovial: 4677 cells/uL — ABNORMAL HIGH (ref <150)

## 2022-08-09 LAB — ANAEROBIC AND AEROBIC CULTURE
AER RESULT:: NO GROWTH
MICRO NUMBER:: 14478716
MICRO NUMBER:: 14478717
SPECIMEN QUALITY:: ADEQUATE
SPECIMEN QUALITY:: ADEQUATE

## 2022-08-12 NOTE — Progress Notes (Signed)
Knee Culture is negative. No knee infection

## 2022-08-29 ENCOUNTER — Other Ambulatory Visit: Payer: Self-pay | Admitting: Physical Medicine and Rehabilitation

## 2022-08-29 ENCOUNTER — Telehealth: Payer: Self-pay | Admitting: Physical Medicine and Rehabilitation

## 2022-08-29 DIAGNOSIS — R202 Paresthesia of skin: Secondary | ICD-10-CM

## 2022-08-29 DIAGNOSIS — M961 Postlaminectomy syndrome, not elsewhere classified: Secondary | ICD-10-CM

## 2022-08-29 DIAGNOSIS — M5416 Radiculopathy, lumbar region: Secondary | ICD-10-CM

## 2022-08-29 DIAGNOSIS — M21371 Foot drop, right foot: Secondary | ICD-10-CM

## 2022-08-29 NOTE — Telephone Encounter (Signed)
Spoke with patient and he stated his right foot is numb and he wants a repeat of the last injection. He states he is wearing his brace more now because he tries not to wear it often. He states for the first time last night he had burning and stinging in his right leg. Please advise

## 2022-08-29 NOTE — Telephone Encounter (Signed)
Pt called requesting a call back to set an appt for right foot numbness. Est pt of Dr Ernestina Patches. Please call pt at [phone].

## 2022-09-04 ENCOUNTER — Other Ambulatory Visit: Payer: Self-pay | Admitting: Internal Medicine

## 2022-09-04 NOTE — Telephone Encounter (Signed)
Please refill as per office routine med refill policy (all routine meds to be refilled for 3 mo or monthly (per pt preference) up to one year from last visit, then month to month grace period for 3 mo, then further med refills will have to be denied) ? ?

## 2022-09-06 ENCOUNTER — Telehealth: Payer: Self-pay | Admitting: Physical Medicine and Rehabilitation

## 2022-09-06 NOTE — Telephone Encounter (Signed)
Closed referral

## 2022-09-06 NOTE — Telephone Encounter (Signed)
Patient called advised he feels better. Patient said he will not be needing an appointment after all.  [phone]

## 2022-09-23 ENCOUNTER — Telehealth: Payer: Self-pay | Admitting: Physical Medicine and Rehabilitation

## 2022-09-23 DIAGNOSIS — M5416 Radiculopathy, lumbar region: Secondary | ICD-10-CM

## 2022-09-23 NOTE — Telephone Encounter (Signed)
Patient requesting an appointment with Dr. Newton 

## 2022-09-23 NOTE — Telephone Encounter (Signed)
Patient is wanting a refill on a medication, but is unsure what the name of the medication is. Please advise

## 2022-09-23 NOTE — Telephone Encounter (Signed)
Pt called requesting a call back. Pt asking for a refill but unsure the name of medication. Please call pt about this matter at [phone].

## 2022-09-23 NOTE — Telephone Encounter (Signed)
Spoke with patient and scheduled injection for 10/07/22. Patient aware driver needed

## 2022-09-27 ENCOUNTER — Telehealth: Payer: Self-pay | Admitting: Physical Medicine and Rehabilitation

## 2022-09-27 MED ORDER — PREDNISONE 50 MG PO TABS: ORAL_TABLET | ORAL | 0 refills | Status: DC

## 2022-09-27 NOTE — Telephone Encounter (Signed)
Rx sent in to Pharm on record

## 2022-09-27 NOTE — Telephone Encounter (Signed)
LVM to return call to clinic.

## 2022-09-27 NOTE — Telephone Encounter (Signed)
See previous encounter

## 2022-09-27 NOTE — Telephone Encounter (Signed)
Spoke with patient and he would like to try the prednisone. Please send to [facility]

## 2022-09-27 NOTE — Telephone Encounter (Signed)
Patient returned call asked for a call back. The number to contact patient is [phone]

## 2022-09-27 NOTE — Addendum Note (Signed)
Addended by: Raymondo Band on: 09/27/2022 03:20 PM   Modules accepted: Orders

## 2022-10-07 ENCOUNTER — Ambulatory Visit (INDEPENDENT_AMBULATORY_CARE_PROVIDER_SITE_OTHER): Payer: Medicare Other | Admitting: Physical Medicine and Rehabilitation

## 2022-10-07 ENCOUNTER — Other Ambulatory Visit: Payer: Self-pay

## 2022-10-07 VITALS — BP 158/79 | HR 76

## 2022-10-07 DIAGNOSIS — M21371 Foot drop, right foot: Secondary | ICD-10-CM

## 2022-10-07 DIAGNOSIS — M5416 Radiculopathy, lumbar region: Secondary | ICD-10-CM | POA: Diagnosis not present

## 2022-10-07 DIAGNOSIS — M961 Postlaminectomy syndrome, not elsewhere classified: Secondary | ICD-10-CM | POA: Diagnosis not present

## 2022-10-07 MED ORDER — METHYLPREDNISOLONE ACETATE 80 MG/ML IJ SUSP
80.0000 mg | Freq: Once | INTRAMUSCULAR | Status: AC
  Administered 2022-10-07: 80 mg

## 2022-10-07 NOTE — Progress Notes (Signed)
Functional Pain Scale - descriptive words and definitions  Distracting (5)    Aware of pain/able to complete some ADL's but limited by pain/sleep is affected and active distractions are only slightly useful. Moderate range order  Average Pain 8   +Driver, -BT, -Dye Allergies.  Lower back pain on right that radiates down the right leg to the foot

## 2022-10-07 NOTE — Patient Instructions (Signed)

## 2022-10-12 NOTE — Progress Notes (Signed)
Alex Massey - 82 y.o. male MRN [id number]  Date of birth: 1940/08/15  Office Visit Note: Visit Date: 10/07/2022 PCP: Corwin LevinsJohn, James W, MD Referred by: Corwin LevinsJohn, James W, MD  Subjective: Chief Complaint  Patient presents with   Lower Back - Pain   HPI:  Alex Massey is a 82 y.o. male who comes in today for planned repeat Right L5-S1 and S1-2  Lumbar Transforaminal epidural steroid injection with fluoroscopic guidance.  The patient has failed conservative care including home exercise, medications, time and activity modification.  This injection will be diagnostic and hopefully therapeutic.  Please see requesting physician notes for further details and justification. Patient received more than 50% pain relief from prior injection.   Referring: Ellin GoodieMegan Williams, FNP   ROS Otherwise per HPI.  Assessment & Plan: Visit Diagnoses:    ICD-10-CM   1. Lumbar radiculopathy  M54.16 XR C-ARM NO REPORT    Epidural Steroid injection    methylPREDNISolone acetate (DEPO-MEDROL) injection 80 mg    2. Post laminectomy syndrome  M96.1     3. Right foot drop  M21.371       Plan: No additional findings.   Meds & Orders:  Meds ordered this encounter  Medications   methylPREDNISolone acetate (DEPO-MEDROL) injection 80 mg    Orders Placed This Encounter  Procedures   XR C-ARM NO REPORT   Epidural Steroid injection    Follow-up: Return if symptoms worsen or fail to improve.   Procedures: No procedures performed  Lumbosacral Transforaminal Epidural Steroid Injection - Sub-Pedicular Approach with Fluoroscopic Guidance  Patient: Alex Massey      Date of Birth: 1940/08/15 MRN: [id number] PCP: Corwin LevinsJohn, James W, MD      Visit Date: 10/07/2022   Universal Protocol:    Date/Time: 10/07/2022  Consent Given By: the patient  Position: PRONE  Additional Comments: Vital signs were monitored before and after the procedure. Patient was prepped and draped in the usual sterile fashion. The  correct patient, procedure, and site was verified.   Injection Procedure Details:   Procedure diagnoses: Lumbar radiculopathy [M54.16]    Meds Administered:  Meds ordered this encounter  Medications   methylPREDNISolone acetate (DEPO-MEDROL) injection 80 mg    Laterality: Right  Location/Site: L5 and S1  Needle:5.0 in., 22 ga.  Short bevel or Quincke spinal needle  Needle Placement: Transforaminal  Findings:    -Comments: Excellent flow of contrast along the nerve, nerve root and into the epidural space.  Procedure Details: After squaring off the end-plates to get a true AP view, the C-arm was positioned so that an oblique view of the foramen as noted above was visualized. The target area is just inferior to the "nose of the scotty dog" or sub pedicular. The soft tissues overlying this structure were infiltrated with 2-3 ml. of 1% Lidocaine without Epinephrine.  The spinal needle was inserted toward the target using a "trajectory" view along the fluoroscope beam.  Under AP and lateral visualization, the needle was advanced so it did not puncture dura and was located close the 6 O'Clock position of the pedical in AP tracterory. Biplanar projections were used to confirm position. Aspiration was confirmed to be negative for CSF and/or blood. A 1-2 ml. volume of Isovue-250 was injected and flow of contrast was noted at each level. Radiographs were obtained for documentation purposes.   After attaining the desired flow of contrast documented above, a 0.5 to 1.0 ml test dose of 0.25% Marcaine was injected  into each respective transforaminal space.  The patient was observed for 90 seconds post injection.  After no sensory deficits were reported, and normal lower extremity motor function was noted,   the above injectate was administered so that equal amounts of the injectate were placed at each foramen (level) into the transforaminal epidural space.   Additional Comments:  The patient  tolerated the procedure well Dressing: 2 x 2 sterile gauze and Band-Aid    Post-procedure details: Patient was observed during the procedure. Post-procedure instructions were reviewed.  Patient left the clinic in stable condition.    Clinical History: EXAM: MRI LUMBAR SPINE WITHOUT CONTRAST   TECHNIQUE: Multiplanar, multisequence MR imaging of the lumbar spine was performed. No intravenous contrast was administered.   COMPARISON:  07/17/2014   FINDINGS: Segmentation:  Standard lumbar numbering   Alignment:  Physiologic   Vertebrae: Horizontal marrow edema with hypointense fracture line at L3. Height loss is minimal. No evidence of underlying lesion. Prominent fatty marrow at the level of the lower lumbar spine and sacrum, chronic and presumably from prostate cancer treatment.   Conus medullaris and cauda equina: Conus extends to the L1 level. Conus and cauda equina appear normal.   Paraspinal and other soft tissues: Scarring at the level of L4 and L5.   Disc levels:   T12- L1: Unremarkable.   L1-L2: Unremarkable.   L2-L3: Mild disc narrowing and bulging.   L3-L4: Disc narrowing and bulging borderline facet spurring.   L4-L5: Disc narrowing and right eccentric bulging. Prior right-sided laminotomy. The canal and foramina are patent   L5-S1:Greatest level of disc narrowing with bulging and ridging. Negative facets. Noncompressive left foraminal narrowing. Patent spinal canal.   IMPRESSION: 1. L3 compression fracture with slight height loss. 2. Noncompressive disc degeneration as described. Prior right-sided decompression at L4-5.     Electronically Signed   By: Marnee Spring M.D.   On: 02/27/2020 09:12     Objective:  VS:  HT:    WT:   BMI:     BP:(!) 158/79  HR:76bpm  TEMP: ( )  RESP:  Physical Exam Vitals and nursing note reviewed.  Constitutional:      General: He is not in acute distress.    Appearance: Normal appearance. He is not  ill-appearing.  HENT:     Head: Normocephalic and atraumatic.     Right Ear: External ear normal.     Left Ear: External ear normal.     Nose: No congestion.  Eyes:     Extraocular Movements: Extraocular movements intact.  Cardiovascular:     Rate and Rhythm: Normal rate.     Pulses: Normal pulses.  Pulmonary:     Effort: Pulmonary effort is normal. No respiratory distress.  Abdominal:     General: There is no distension.     Palpations: Abdomen is soft.  Musculoskeletal:        General: No tenderness or signs of injury.     Cervical back: Neck supple.     Right lower leg: No edema.     Left lower leg: No edema.     Comments: Patient has good left distal and proximal strength.  He has no pain with hip rotation.  He does have some pain over the greater trochanter and sciatic notch on the right.  He does ambulate with foot drop on the right with AFO.  Skin:    Findings: No erythema or rash.  Neurological:     General: No focal deficit  present.     Mental Status: He is alert and oriented to person, place, and time.     Sensory: No sensory deficit.     Motor: Weakness present. No abnormal muscle tone.     Coordination: Coordination normal.     Gait: Gait abnormal.     Comments: Right foot drop wearing AFO  Psychiatric:        Mood and Affect: Mood normal.        Behavior: Behavior normal.      Imaging: No results found.

## 2022-10-12 NOTE — Procedures (Signed)
Lumbosacral Transforaminal Epidural Steroid Injection - Sub-Pedicular Approach with Fluoroscopic Guidance  Patient: Alex Massey      Date of Birth: Jun 29, 1941 MRN: [id number] PCP: Corwin Levins, MD      Visit Date: 10/07/2022   Universal Protocol:    Date/Time: 10/07/2022  Consent Given By: the patient  Position: PRONE  Additional Comments: Vital signs were monitored before and after the procedure. Patient was prepped and draped in the usual sterile fashion. The correct patient, procedure, and site was verified.   Injection Procedure Details:   Procedure diagnoses: Lumbar radiculopathy [M54.16]    Meds Administered:  Meds ordered this encounter  Medications   methylPREDNISolone acetate (DEPO-MEDROL) injection 80 mg    Laterality: Right  Location/Site: L5 and S1  Needle:5.0 in., 22 ga.  Short bevel or Quincke spinal needle  Needle Placement: Transforaminal  Findings:    -Comments: Excellent flow of contrast along the nerve, nerve root and into the epidural space.  Procedure Details: After squaring off the end-plates to get a true AP view, the C-arm was positioned so that an oblique view of the foramen as noted above was visualized. The target area is just inferior to the "nose of the scotty dog" or sub pedicular. The soft tissues overlying this structure were infiltrated with 2-3 ml. of 1% Lidocaine without Epinephrine.  The spinal needle was inserted toward the target using a "trajectory" view along the fluoroscope beam.  Under AP and lateral visualization, the needle was advanced so it did not puncture dura and was located close the 6 O'Clock position of the pedical in AP tracterory. Biplanar projections were used to confirm position. Aspiration was confirmed to be negative for CSF and/or blood. A 1-2 ml. volume of Isovue-250 was injected and flow of contrast was noted at each level. Radiographs were obtained for documentation purposes.   After attaining the  desired flow of contrast documented above, a 0.5 to 1.0 ml test dose of 0.25% Marcaine was injected into each respective transforaminal space.  The patient was observed for 90 seconds post injection.  After no sensory deficits were reported, and normal lower extremity motor function was noted,   the above injectate was administered so that equal amounts of the injectate were placed at each foramen (level) into the transforaminal epidural space.   Additional Comments:  The patient tolerated the procedure well Dressing: 2 x 2 sterile gauze and Band-Aid    Post-procedure details: Patient was observed during the procedure. Post-procedure instructions were reviewed.  Patient left the clinic in stable condition.

## 2022-10-30 ENCOUNTER — Telehealth: Payer: Self-pay | Admitting: Internal Medicine

## 2022-10-30 MED ORDER — TAMSULOSIN HCL 0.4 MG PO CAPS
0.4000 mg | ORAL_CAPSULE | Freq: Every day | ORAL | 2 refills | Status: DC
Start: 2022-10-30 — End: 2022-10-30

## 2022-10-30 MED ORDER — TAMSULOSIN HCL 0.4 MG PO CAPS: 0.4000 mg | ORAL_CAPSULE | Freq: Every day | ORAL | 2 refills | Status: DC

## 2022-10-30 NOTE — Telephone Encounter (Signed)
Done erx 

## 2022-10-30 NOTE — Telephone Encounter (Signed)
Prescription Request  10/30/2022  LOV: 08/01/2022  What is the name of the medication or equipment? Flomax 0.4  Have you contacted your pharmacy to request a refill? Yes   Which pharmacy would you like this sent to?  [facility] [phone] - [place], [place] - [address] AT [facility] [address] [place] [address] Phone: [phone] Fax: [phone]   Patient notified that their request is being sent to the clinical staff for review and that they should receive a response within 2 business days.   Please advise at Mobile [phone] (mobile)

## 2022-11-04 DIAGNOSIS — Z8546 Personal history of malignant neoplasm of prostate: Secondary | ICD-10-CM | POA: Diagnosis not present

## 2022-11-11 DIAGNOSIS — Z8546 Personal history of malignant neoplasm of prostate: Secondary | ICD-10-CM | POA: Diagnosis not present

## 2022-11-11 DIAGNOSIS — N400 Enlarged prostate without lower urinary tract symptoms: Secondary | ICD-10-CM | POA: Diagnosis not present

## 2022-11-11 DIAGNOSIS — N5201 Erectile dysfunction due to arterial insufficiency: Secondary | ICD-10-CM | POA: Diagnosis not present

## 2022-11-19 ENCOUNTER — Other Ambulatory Visit: Payer: Self-pay

## 2022-11-19 ENCOUNTER — Telehealth: Payer: Self-pay | Admitting: Internal Medicine

## 2022-11-19 MED ORDER — ACARBOSE 50 MG PO TABS: ORAL_TABLET | ORAL | 1 refills | Status: DC

## 2022-11-19 NOTE — Telephone Encounter (Signed)
Prescription Request  11/19/2022  LOV: 08/01/2022  What is the name of the medication or equipment?  acarbose (PRECOSE) 50 MG tablet  Have you contacted your pharmacy to request a refill? Yes   Which pharmacy would you like this sent to?  [facility] [phone] - [place], [place] - [address] AT [facility] [place] [address] [place] [address] Phone: [phone] Fax: [phone]    Patient notified that their request is being sent to the clinical staff for review and that they should receive a response within 2 business days.   Please advise at Mobile [phone] (mobile)

## 2022-11-19 NOTE — Telephone Encounter (Signed)
Refill Sent. 

## 2022-11-25 ENCOUNTER — Other Ambulatory Visit: Payer: Self-pay

## 2022-11-25 MED ORDER — AMLODIPINE BESYLATE 5 MG PO TABS: 5.0000 mg | ORAL_TABLET | Freq: Every day | ORAL | 3 refills | Status: DC

## 2022-12-03 ENCOUNTER — Encounter: Payer: Self-pay | Admitting: Internal Medicine

## 2022-12-03 ENCOUNTER — Ambulatory Visit (INDEPENDENT_AMBULATORY_CARE_PROVIDER_SITE_OTHER): Payer: Medicare Other | Admitting: Internal Medicine

## 2022-12-03 VITALS — BP 118/78 | HR 70 | Temp 98.6°F | Ht 66.0 in | Wt 154.0 lb

## 2022-12-03 DIAGNOSIS — E1165 Type 2 diabetes mellitus with hyperglycemia: Secondary | ICD-10-CM

## 2022-12-03 DIAGNOSIS — E559 Vitamin D deficiency, unspecified: Secondary | ICD-10-CM

## 2022-12-03 DIAGNOSIS — I10 Essential (primary) hypertension: Secondary | ICD-10-CM

## 2022-12-03 DIAGNOSIS — Z7984 Long term (current) use of oral hypoglycemic drugs: Secondary | ICD-10-CM

## 2022-12-03 DIAGNOSIS — E782 Mixed hyperlipidemia: Secondary | ICD-10-CM

## 2022-12-03 LAB — BASIC METABOLIC PANEL
BUN: 11 mg/dL (ref 6–23)
CO2: 26 mEq/L (ref 19–32)
Calcium: 9.8 mg/dL (ref 8.4–10.5)
Chloride: 102 mEq/L (ref 96–112)
Creatinine, Ser: 0.77 mg/dL (ref 0.40–1.50)
GFR: 84.34 mL/min (ref >60.00)
Glucose, Bld: 127 mg/dL — ABNORMAL HIGH (ref 70–99)
Potassium: 3.8 mEq/L (ref 3.5–5.1)
Sodium: 137 mEq/L (ref 135–145)

## 2022-12-03 LAB — HEMOGLOBIN A1C: Hgb A1c MFr Bld: 6.5 % (ref 4.6–6.5)

## 2022-12-03 LAB — HEPATIC FUNCTION PANEL
ALT: 13 U/L (ref 0–53)
AST: 15 U/L (ref 0–37)
Albumin: 4.4 g/dL (ref 3.5–5.2)
Alkaline Phosphatase: 53 U/L (ref 39–117)
Bilirubin, Direct: 0.2 mg/dL (ref 0.0–0.3)
Total Bilirubin: 0.9 mg/dL (ref 0.2–1.2)
Total Protein: 6.7 g/dL (ref 6.0–8.3)

## 2022-12-03 LAB — LIPID PANEL
Cholesterol: 143 mg/dL (ref 0–200)
HDL: 83.2 mg/dL (ref >39.00)
LDL Cholesterol: 42 mg/dL (ref 0–99)
NonHDL: 60.02
Total CHOL/HDL Ratio: 2
Triglycerides: 89 mg/dL (ref 0.0–149.0)
VLDL: 17.8 mg/dL (ref 0.0–40.0)

## 2022-12-03 MED ORDER — GLIPIZIDE ER 5 MG PO TB24: 5.0000 mg | ORAL_TABLET | Freq: Every day | ORAL | 3 refills | Status: DC

## 2022-12-03 NOTE — Patient Instructions (Addendum)
With your recent wt loss, we should reduce the glipizide XL to 5 mg per day  Please stop the amlodipine 5 mg ONLY if the BP is usually less than 110 for the top number, or you have dizziness with lower blood pressures as well  Please continue all other medications as before, and refills have been done if requested.  Please have the pharmacy call with any other refills you may need.  Please continue your efforts at being more active, low cholesterol diet, and weight control.  Please keep your appointments with your specialists as you may have planned  Please go to the LAB at the blood drawing area for the tests to be done  You will be contacted by phone if any changes need to be made immediately.  Otherwise, you will receive a letter about your results with an explanation, but please check with MyChart first.  Please make an Appointment to return in 6 months, or sooner if needed

## 2022-12-03 NOTE — Progress Notes (Unsigned)
Patient ID: Alex Massey, male   DOB: 1940-12-30, 82 y.o.   MRN: [id number]        Chief Complaint: follow up HTN, HLD and hyperglycemia ***       HPI:  Alex Massey is a 82 y.o. male here with c/o       Appetite .less lately, just eating less lately.     Wt Readings from Last 3 Encounters:  12/03/22 154 lb (69.9 kg)  08/02/22 158 lb (71.7 kg)  08/02/22 160 lb (72.6 kg)   BP Readings from Last 3 Encounters:  12/03/22 118/78  10/07/22 (!) 158/79  08/02/22 126/72         Past Medical History:  Diagnosis Date   Acquired right foot drop    post lumbar surgery,  wears brace   Allergy    B12 deficiency    Chronic constipation    diet   ED (erectile dysfunction)    History of kidney stones    Hyperlipidemia    Hyperplasia of prostate with lower urinary tract symptoms (LUTS)    Hypertension    Insomnia    Neuropathy, peripheral    right foot   Presence of surgical incision 01/21/2019   s/p  left carpal tunnel release --  pt has dressing on,  denies pain or numbness   Prostate cancer [facility]) urologist-- dr eskridge/  oncologist-  dr Kathrynn Running   dx 08-04-2018,  Stage T1c, Gleason 4+4   Renal cyst, right    Type 2 diabetes mellitus (HCC)    followed by pcp   Wears partial dentures    upper and lower   Past Surgical History:  Procedure Laterality Date   CARPAL TUNNEL RELEASE Bilateral left 01-21-2019;  right 1980s   CATARACT EXTRACTION Left 03/2020   CYSTOSCOPY N/A 01/26/2019   Procedure: CYSTOSCOPY FLEXIBLE;  Surgeon: Jerilee Field, MD;  Location: [facility];  Service: Urology;  Laterality: N/A;  no seeds found in bladder   LUMBAR DISC SURGERY  1984  L4-5;  1998 L4--S1   LUMBAR EPIDURAL INJECTION  2009, 2006   dr Alvester Morin   LUMBAR MICRODISCECTOMY  05-13-2002  dr Criss Alvine  @[facility]    L4-5   RADIOACTIVE SEED IMPLANT N/A 01/26/2019   Procedure: RADIOACTIVE SEED IMPLANT/BRACHYTHERAPY IMPLANT;  Surgeon: Jerilee Field, MD;  Location: [facility];  Service: Urology;  Laterality: N/A;    54 seeds implanted   SPACE OAR INSTILLATION N/A 01/26/2019   Procedure: SPACE OAR INSTILLATION;  Surgeon: Jerilee Field, MD;  Location: [facility];  Service: Urology;  Laterality: N/A;    reports that he quit smoking about 19 years ago. His smoking use included cigarettes. He has never used smokeless tobacco. He reports current alcohol use of about 3.0 standard drinks of alcohol per week. He reports that he does not use drugs. family history includes Colon cancer (age of onset: 73) in his brother; Diabetes in his father; Healthy in his mother; Prostate cancer in his brother, cousin, and nephew. Allergies  Allergen Reactions   Metformin And Related Other (See Comments)    GI upset, diarrhea, wt loss   Current Outpatient Medications on File Prior to Visit  Medication Sig Dispense Refill   acarbose (PRECOSE) 50 MG tablet TAKE 1 TABLET BY MOUTH 3 TIMES DAILY WITH MEALS 270 tablet 1   amLODipine (NORVASC) 5 MG tablet Take 1 tablet (5 mg total) by mouth daily. 90 tablet 3   aspirin EC 81 MG tablet Take  1 tablet (81 mg total) by mouth daily. 150 tablet 2   blood glucose meter kit and supplies KIT Inject 1 each into the skin 2 (two) times daily. Dispense based on patient and insurance preference. Use up to four times daily as directed. (FOR ICD-9 250.00, 250.01). 1 each 0   Blood Glucose Monitoring Suppl (FREESTYLE LITE) DEVI USE TO CHECK GLUCOSE UP TO 4 TIMES DAILY AS DIRECTED     Blood Glucose Monitoring Suppl (ONE TOUCH ULTRA 2) w/Device KIT Use to check bloods sugars twice a day 1 kit 0   Cholecalciferol (VITAMIN D) 50 MCG (2000 UT) CAPS Take 1 capsule by mouth daily.     glipiZIDE (GLUCOTROL XL) 10 MG 24 hr tablet TAKE 1 TABLET(10 MG) BY MOUTH DAILY WITH BREAKFAST 90 tablet 3   glucose blood (ONETOUCH ULTRA) test strip Use to check blood sugars twice a day 100 each 12   losartan (COZAAR) 50 MG tablet Take 1 tablet (50 mg total) by  mouth in the morning and at bedtime. 180 tablet 2   meloxicam (MOBIC) 15 MG tablet TAKE 1 TABLET(15 MG) BY MOUTH DAILY 30 tablet 3   OneTouch Delica Lancets 30G MISC Use to check blood sugars twice a day 100 each 1   oxybutynin (DITROPAN) 5 MG tablet Take 1 tablet (5 mg total) by mouth at bedtime. 90 tablet 0   predniSONE (DELTASONE) 50 MG tablet Take 1 tablet daily with food for 5 days until finished 5 tablet 0   simvastatin (ZOCOR) 20 MG tablet TAKE 1 TABLET BY MOUTH EVERY NIGHT AT BEDTIME 90 tablet 1   sitaGLIPtin (JANUVIA) 100 MG tablet Take 1 tablet (100 mg total) by mouth daily. 90 tablet 3   tamsulosin (FLOMAX) 0.4 MG CAPS capsule Take 1 capsule (0.4 mg total) by mouth daily. 90 capsule 2   triamcinolone (NASACORT) 55 MCG/ACT AERO nasal inhaler Place 2 sprays into the nose daily. 1 each 12   vitamin B-12 (CYANOCOBALAMIN) 1000 MCG tablet Take 1,000 mcg by mouth daily.     No current facility-administered medications on file prior to visit.        ROS:  All others reviewed and negative.  Objective        PE:  BP 118/78 (BP Location: Right Arm, Patient Position: Sitting, Cuff Size: Normal)   Pulse 70   Temp 98.6 F (37 C) (Oral)   Ht 5\' 6"  (1.676 m)   Wt 154 lb (69.9 kg)   SpO2 99%   BMI 24.86 kg/m                 Constitutional: Pt appears in NAD               HENT: Head: NCAT.                Right Ear: External ear normal.                 Left Ear: External ear normal.                Eyes: . Pupils are equal, round, and reactive to light. Conjunctivae and EOM are normal               Nose: without d/c or deformity               Neck: Neck supple. Gross normal ROM               Cardiovascular: Normal rate and regular  rhythm.                 Pulmonary/Chest: Effort normal and breath sounds without rales or wheezing.                Abd:  Soft, NT, ND, + BS, no organomegaly               Neurological: Pt is alert. At baseline orientation, motor grossly intact                Skin: Skin is warm. No rashes, no other new lesions, LE edema - ***               Psychiatric: Pt behavior is normal without agitation   Micro: none  Cardiac tracings I have personally interpreted today:  none  Pertinent Radiological findings (summarize): none   Lab Results  Component Value Date   WBC 5.1 08/01/2022   HGB 12.3 (L) 08/01/2022   HCT 37.9 (L) 08/01/2022   PLT 132.0 (L) 08/01/2022   GLUCOSE 186 (H) 08/01/2022   CHOL 141 08/01/2022   TRIG 71.0 08/01/2022   HDL 77.90 08/01/2022   LDLCALC 48 08/01/2022   ALT 16 08/01/2022   AST 13 08/01/2022   NA 137 08/01/2022   K 4.2 08/01/2022   CL 101 08/01/2022   CREATININE 0.88 08/01/2022   BUN 14 08/01/2022   CO2 28 08/01/2022   TSH 1.54 08/01/2022   PSA 0.01 (L) 11/27/2020   INR 1.0 01/22/2019   HGBA1C 6.7 (H) 08/01/2022   MICROALBUR 2.0 (H) 08/01/2022   Assessment/Plan:  GLENVILLE ARACE is a 82 y.o. Black or African American [2] male with  has a past medical history of Acquired right foot drop, Allergy, B12 deficiency, Chronic constipation, ED (erectile dysfunction), History of kidney stones, Hyperlipidemia, Hyperplasia of prostate with lower urinary tract symptoms (LUTS), Hypertension, Insomnia, Neuropathy, peripheral, Presence of surgical incision (01/21/2019), Prostate cancer [facility]) (urologist-- dr eskridge/  oncologist-  dr Kathrynn Running), Renal cyst, right, Type 2 diabetes mellitus (HCC), and Wears partial dentures.  No problem-specific Assessment & Plan notes found for this encounter.  Followup: No follow-ups on file.  Oliver Barre, MD 12/03/2022 9:40 AM [facility] Internal Medicine

## 2022-12-04 ENCOUNTER — Encounter: Payer: Self-pay | Admitting: Internal Medicine

## 2022-12-04 NOTE — Assessment & Plan Note (Signed)
Lab Results  Component Value Date   HGBA1C 6.5 12/03/2022   Overcontrolled for age, goal A1c now < 7.5, now with recent wt loss pt to continue current medical treatment precose 50 tid, januvia 100 qd, and for reduced glipizide xl 5 qd

## 2022-12-04 NOTE — Assessment & Plan Note (Signed)
Lab Results  Component Value Date   LDLCALC 42 12/03/2022   Stable, pt to continue current statin zocor 20 mg qd

## 2022-12-04 NOTE — Assessment & Plan Note (Signed)
Last vitamin D Lab Results  Component Value Date   VD25OH 34.83 08/01/2022   Low, to start oral replacement  

## 2022-12-04 NOTE — Assessment & Plan Note (Signed)
BP Readings from Last 3 Encounters:  12/03/22 118/78  10/07/22 (!) 158/79  08/02/22 126/72   Likely overcontrolled, pt to continue medical treatment losartan 50 mg qd, but d/c amlodipine

## 2022-12-06 DIAGNOSIS — N5201 Erectile dysfunction due to arterial insufficiency: Secondary | ICD-10-CM | POA: Diagnosis not present

## 2022-12-13 ENCOUNTER — Telehealth: Payer: Self-pay | Admitting: Internal Medicine

## 2022-12-13 MED ORDER — OXYBUTYNIN CHLORIDE 5 MG PO TABS: 5.0000 mg | ORAL_TABLET | Freq: Every day | ORAL | 3 refills | Status: AC

## 2022-12-13 NOTE — Telephone Encounter (Signed)
Prescription Request  12/13/2022  LOV: 12/03/2022  What is the name of the medication or equipment? oxybutrim  Have you contacted your pharmacy to request a refill? Yes   Which pharmacy would you like this sent to?  [facility] on [address]   Patient notified that their request is being sent to the clinical staff for review and that they should receive a response within 2 business days.   Please advise at Mobile [phone] (mobile)

## 2022-12-13 NOTE — Telephone Encounter (Signed)
Done erx 

## 2022-12-30 DIAGNOSIS — I10 Essential (primary) hypertension: Secondary | ICD-10-CM | POA: Diagnosis not present

## 2022-12-30 DIAGNOSIS — Z9849 Cataract extraction status, unspecified eye: Secondary | ICD-10-CM | POA: Diagnosis not present

## 2023-01-27 ENCOUNTER — Telehealth: Payer: Self-pay | Admitting: Internal Medicine

## 2023-01-27 ENCOUNTER — Other Ambulatory Visit: Payer: Self-pay

## 2023-01-27 MED ORDER — LOSARTAN POTASSIUM 50 MG PO TABS: 50.0000 mg | ORAL_TABLET | Freq: Two times a day (BID) | ORAL | 2 refills | Status: DC

## 2023-01-27 NOTE — Telephone Encounter (Signed)
Refill Sent. 

## 2023-01-27 NOTE — Telephone Encounter (Signed)
Prescription Request  01/27/2023  LOV: 12/03/2022  What is the name of the medication or equipment? Losartan  Have you contacted your pharmacy to request a refill? Yes   Which pharmacy would you like this sent to?  [facility] [phone] - [place], [place] - [address] AT [facility] [address] [place] [address] Phone: [phone] Fax: [phone]     Patient notified that their request is being sent to the clinical staff for review and that they should receive a response within 2 business days.   Please advise at Mobile [phone] (mobile)

## 2023-03-12 ENCOUNTER — Other Ambulatory Visit: Payer: Self-pay | Admitting: Family Medicine

## 2023-03-14 ENCOUNTER — Other Ambulatory Visit: Payer: Self-pay

## 2023-03-14 MED ORDER — ACARBOSE 50 MG PO TABS: ORAL_TABLET | ORAL | 1 refills | Status: DC

## 2023-03-24 DIAGNOSIS — E113291 Type 2 diabetes mellitus with mild nonproliferative diabetic retinopathy without macular edema, right eye: Secondary | ICD-10-CM | POA: Diagnosis not present

## 2023-03-24 DIAGNOSIS — Z961 Presence of intraocular lens: Secondary | ICD-10-CM | POA: Diagnosis not present

## 2023-03-24 LAB — HM DIABETES EYE EXAM

## 2023-04-02 ENCOUNTER — Telehealth: Payer: Self-pay | Admitting: Physical Medicine and Rehabilitation

## 2023-04-08 ENCOUNTER — Telehealth: Payer: Self-pay | Admitting: Orthopedic Surgery

## 2023-04-08 NOTE — Telephone Encounter (Signed)
Pt advised he would like pain medication for his right foot pain til his appt with Dr. Lajoyce Corners please. Beverly Gust [phone]

## 2023-04-08 NOTE — Telephone Encounter (Signed)
We have never seen this pt in the office before. I can open a time with Alex Massey on Laura you want to offer that to get him in sooner but we can not do meds without eval.

## 2023-04-09 ENCOUNTER — Ambulatory Visit: Payer: Medicare Other | Admitting: Orthopedic Surgery

## 2023-04-11 DIAGNOSIS — M545 Low back pain, unspecified: Secondary | ICD-10-CM | POA: Diagnosis not present

## 2023-04-15 ENCOUNTER — Encounter: Payer: Self-pay | Admitting: Orthopedic Surgery

## 2023-04-15 ENCOUNTER — Ambulatory Visit (INDEPENDENT_AMBULATORY_CARE_PROVIDER_SITE_OTHER): Payer: Medicare Other | Admitting: Orthopedic Surgery

## 2023-04-15 DIAGNOSIS — M79671 Pain in right foot: Secondary | ICD-10-CM

## 2023-04-15 DIAGNOSIS — M21371 Foot drop, right foot: Secondary | ICD-10-CM | POA: Diagnosis not present

## 2023-04-15 DIAGNOSIS — M25462 Effusion, left knee: Secondary | ICD-10-CM | POA: Diagnosis not present

## 2023-04-15 MED ORDER — LIDOCAINE HCL (PF) 1 % IJ SOLN
5.0000 mL | INTRAMUSCULAR | Status: AC | PRN
Start: 2023-04-15 — End: 2023-04-15
  Administered 2023-04-15: 5 mL

## 2023-04-15 MED ORDER — METHYLPREDNISOLONE ACETATE 40 MG/ML IJ SUSP
40.0000 mg | INTRAMUSCULAR | Status: AC | PRN
Start: 2023-04-15 — End: 2023-04-15
  Administered 2023-04-15: 40 mg via INTRA_ARTICULAR

## 2023-04-15 NOTE — Progress Notes (Signed)
Office Visit Note   Patient: Alex Massey           Date of Birth: February 25, 1941           MRN: [id number] Visit Date: 04/15/2023              Requested by: Corwin Levins, MD [address] [place],  [place] [address] PCP: Corwin Levins, MD  Chief Complaint  Patient presents with   Right Foot - Pain      HPI: Patient is an 82 year old gentleman who is seen for 2 separate issues.  #1 he has effusion and pain in the left knee.  Patient also complains of pain and numbness in the right forefoot.  Patient is currently wearing a posterior AFO for foot drop from lumbar spine surgery.  Assessment & Plan: Visit Diagnoses:  1. Effusion, left knee   2. Right foot drop   3. Pain in right foot     Plan: Recommended a sole orthotic to be worn on top of his AFO to help unload pressure across the forefoot.  The left knee was aspirated of 40 cc of clear synovial fluid and injected.  Follow-Up Instructions: No follow-ups on file.   Ortho Exam  Patient is alert, oriented, no adenopathy, well-dressed, normal affect, normal respiratory effort. Examination patient has nerve deficit of the peroneal nerve to the right foot.  He is not over the dorsum of the foot and does not have active eversion.  He does have intact anterior tibial tendon and weak EHL function.  He has lack of extension of the lesser toes.  Patient does not have eversion foot strength.  Examination of the left knee patient has an effusion.  There is no redness or cellulitis.  Imaging: No results found. No images are attached to the encounter.  Labs: Lab Results  Component Value Date   HGBA1C 6.5 12/03/2022   HGBA1C 6.7 (H) 08/01/2022   HGBA1C 6.4 12/04/2021   ESRSEDRATE 15 05/30/2020   LABURIC 4.9 09/26/2017     Lab Results  Component Value Date   ALBUMIN 4.4 12/03/2022   ALBUMIN 4.6 08/01/2022   ALBUMIN 4.7 12/04/2021    No results found for: "MG" Lab Results  Component Value Date   VD25OH 34.83 08/01/2022    VD25OH 42.32 12/04/2021   VD25OH 23.06 (L) 11/27/2020    No results found for: "PREALBUMIN"    Latest Ref Rng & Units 08/01/2022    3:13 PM 12/04/2021   11:57 AM 11/27/2020    9:14 AM  CBC EXTENDED  WBC 4.0 - 10.5 K/uL 5.1  4.1  3.6   RBC 4.22 - 5.81 Mil/uL 4.71  4.75  4.52   Hemoglobin 13.0 - 17.0 g/dL 60.1  09.3  23.5   HCT 39.0 - 52.0 % 37.9  38.3  36.0   Platelets 150.0 - 400.0 K/uL 132.0  127.0  123.0   NEUT# 1.4 - 7.7 K/uL 2.9  2.5  2.1   Lymph# 0.7 - 4.0 K/uL 1.4  1.0  1.0      There is no height or weight on file to calculate BMI.  Orders:  No orders of the defined types were placed in this encounter.  No orders of the defined types were placed in this encounter.    Procedures: Large Joint Inj: L knee on 04/15/2023 4:35 PM Indications: pain and diagnostic evaluation Details: 22 G 1.5 in needle, superolateral approach  Arthrogram: No  Medications: 5 mL lidocaine (  PF) 1 %; 40 mg methylPREDNISolone acetate 40 MG/ML Aspirate: 40 mL clear Outcome: tolerated well, no immediate complications Procedure, treatment alternatives, risks and benefits explained, specific risks discussed. Consent was given by the patient. Immediately prior to procedure a time out was called to verify the correct patient, procedure, equipment, support staff and site/side marked as required. Patient was prepped and draped in the usual sterile fashion.      Clinical Data: No additional findings.  ROS:  All other systems negative, except as noted in the HPI. Review of Systems  Objective: Vital Signs: There were no vitals taken for this visit.  Specialty Comments:  EXAM: MRI LUMBAR SPINE WITHOUT CONTRAST   TECHNIQUE: Multiplanar, multisequence MR imaging of the lumbar spine was performed. No intravenous contrast was administered.   COMPARISON:  07/17/2014   FINDINGS: Segmentation:  Standard lumbar numbering   Alignment:  Physiologic   Vertebrae: Horizontal marrow edema with  hypointense fracture line at L3. Height loss is minimal. No evidence of underlying lesion. Prominent fatty marrow at the level of the lower lumbar spine and sacrum, chronic and presumably from prostate cancer treatment.   Conus medullaris and cauda equina: Conus extends to the L1 level. Conus and cauda equina appear normal.   Paraspinal and other soft tissues: Scarring at the level of L4 and L5.   Disc levels:   T12- L1: Unremarkable.   L1-L2: Unremarkable.   L2-L3: Mild disc narrowing and bulging.   L3-L4: Disc narrowing and bulging borderline facet spurring.   L4-L5: Disc narrowing and right eccentric bulging. Prior right-sided laminotomy. The canal and foramina are patent   L5-S1:Greatest level of disc narrowing with bulging and ridging. Negative facets. Noncompressive left foraminal narrowing. Patent spinal canal.   IMPRESSION: 1. L3 compression fracture with slight height loss. 2. Noncompressive disc degeneration as described. Prior right-sided decompression at L4-5.     Electronically Signed   By: Marnee Spring M.D.   On: 02/27/2020 09:12  PMFS History: Patient Active Problem List   Diagnosis Date Noted   Pain and swelling of left knee 08/01/2022   COVID-19 virus infection 05/16/2021   Allergic rhinitis 05/16/2021   Vitamin D deficiency 12/03/2020   Congestion of nasal sinus 08/24/2020   Dysphagia 06/04/2020   Night sweats 05/30/2020   Hematochezia 05/30/2020   Right foot drop 02/02/2020   Paresthesia of skin 02/02/2020   Status post carpal tunnel release 02/04/2019   Left carpal tunnel syndrome 11/29/2018   Malignant neoplasm of prostate (HCC) 09/03/2018   Vitamin B12 deficiency 02/13/2018   Lumbar radiculopathy 07/09/2016   Radiculopathy due to lumbar intervertebral disc disorder 07/09/2016   Hemorrhoid 08/07/2015   BPH without obstruction/lower urinary tract symptoms 02/01/2015   Essential hypertension 09/12/2014   Encounter for well adult exam  with abnormal findings 01/14/2011   ERECTILE DYSFUNCTION 04/04/2008   Hyperlipidemia 06/26/2007   Diabetes (HCC) 06/25/2007   NEPHROLITHIASIS, HX OF 06/25/2007   Past Medical History:  Diagnosis Date   Acquired right foot drop    post lumbar surgery,  wears brace   Allergy    B12 deficiency    Chronic constipation    diet   ED (erectile dysfunction)    History of kidney stones    Hyperlipidemia    Hyperplasia of prostate with lower urinary tract symptoms (LUTS)    Hypertension    Insomnia    Neuropathy, peripheral    right foot   Presence of surgical incision 01/21/2019   s/p  left carpal  tunnel release --  pt has dressing on,  denies pain or numbness   Prostate cancer [facility]) urologist-- dr eskridge/  oncologist-  dr Kathrynn Running   dx 08-04-2018,  Stage T1c, Gleason 4+4   Renal cyst, right    Type 2 diabetes mellitus (HCC)    followed by pcp   Wears partial dentures    upper and lower    Family History  Problem Relation Age of Onset   Healthy Mother    Diabetes Father    Prostate cancer Brother    Colon cancer Brother 57   Prostate cancer Cousin        maternal   Prostate cancer Nephew    Rectal cancer Neg Hx    Stomach cancer Neg Hx     Past Surgical History:  Procedure Laterality Date   CARPAL TUNNEL RELEASE Bilateral left 01-21-2019;  right 1980s   CATARACT EXTRACTION Left 03/2020   CYSTOSCOPY N/A 01/26/2019   Procedure: CYSTOSCOPY FLEXIBLE;  Surgeon: Jerilee Field, MD;  [facility];  Service: Urology;  Laterality: N/A;  no seeds found in bladder   LUMBAR DISC SURGERY  1984  L4-5;  1998 L4--S1   LUMBAR EPIDURAL INJECTION  2009, 2006   dr Alvester Morin   LUMBAR MICRODISCECTOMY  05-13-2002  dr Criss Alvine  @[facility]    L4-5   RADIOACTIVE SEED IMPLANT N/A 01/26/2019   Procedure: RADIOACTIVE SEED IMPLANT/BRACHYTHERAPY IMPLANT;  Surgeon: Jerilee Field, MD;  Location: [facility];  Service: Urology;  Laterality: N/A;    54 seeds implanted    SPACE OAR INSTILLATION N/A 01/26/2019   Procedure: SPACE OAR INSTILLATION;  Surgeon: Jerilee Field, MD;  Location: [facility];  Service: Urology;  Laterality: N/A;   Social History   Occupational History    Comment: retired  Tobacco Use   Smoking status: Former    Current packs/day: 0.00    Types: Cigarettes    Start date: 01/25/1983    Quit date: 01/25/2003    Years since quitting: 20.2   Smokeless tobacco: Never  Vaping Use   Vaping status: Never Used  Substance and Sexual Activity   Alcohol use: Yes    Alcohol/week: 3.0 standard drinks of alcohol    Types: 3 Glasses of wine per week   Drug use: Never   Sexual activity: Not on file

## 2023-04-16 ENCOUNTER — Telehealth: Payer: Self-pay | Admitting: Orthopedic Surgery

## 2023-04-16 NOTE — Telephone Encounter (Signed)
Pt was given a script for Dr Lajoyce Corners yesterday but confused on how to order. Please call pt and his wife [phone].

## 2023-04-17 NOTE — Telephone Encounter (Signed)
Called pt, busy dial tone. Will try again later.

## 2023-04-17 NOTE — Telephone Encounter (Signed)
SW pt's wife. She is informed.

## 2023-04-21 ENCOUNTER — Ambulatory Visit: Payer: Medicare Other | Admitting: Orthopedic Surgery

## 2023-04-24 DIAGNOSIS — Z8546 Personal history of malignant neoplasm of prostate: Secondary | ICD-10-CM | POA: Diagnosis not present

## 2023-04-24 DIAGNOSIS — N5201 Erectile dysfunction due to arterial insufficiency: Secondary | ICD-10-CM | POA: Diagnosis not present

## 2023-05-13 ENCOUNTER — Other Ambulatory Visit: Payer: Self-pay

## 2023-05-13 ENCOUNTER — Other Ambulatory Visit: Payer: Self-pay | Admitting: Internal Medicine

## 2023-05-13 DIAGNOSIS — E118 Type 2 diabetes mellitus with unspecified complications: Secondary | ICD-10-CM

## 2023-05-20 ENCOUNTER — Other Ambulatory Visit: Payer: Self-pay | Admitting: Internal Medicine

## 2023-05-28 ENCOUNTER — Encounter: Payer: Self-pay | Admitting: Internal Medicine

## 2023-05-28 ENCOUNTER — Ambulatory Visit: Payer: Medicare Other | Admitting: Internal Medicine

## 2023-05-28 VITALS — BP 122/66 | HR 82 | Temp 98.0°F | Ht 66.0 in | Wt 150.0 lb

## 2023-05-28 DIAGNOSIS — E538 Deficiency of other specified B group vitamins: Secondary | ICD-10-CM | POA: Diagnosis not present

## 2023-05-28 DIAGNOSIS — I10 Essential (primary) hypertension: Secondary | ICD-10-CM

## 2023-05-28 DIAGNOSIS — J309 Allergic rhinitis, unspecified: Secondary | ICD-10-CM

## 2023-05-28 DIAGNOSIS — E782 Mixed hyperlipidemia: Secondary | ICD-10-CM

## 2023-05-28 DIAGNOSIS — Z7984 Long term (current) use of oral hypoglycemic drugs: Secondary | ICD-10-CM | POA: Diagnosis not present

## 2023-05-28 DIAGNOSIS — E559 Vitamin D deficiency, unspecified: Secondary | ICD-10-CM

## 2023-05-28 DIAGNOSIS — R9431 Abnormal electrocardiogram [ECG] [EKG]: Secondary | ICD-10-CM

## 2023-05-28 DIAGNOSIS — E1165 Type 2 diabetes mellitus with hyperglycemia: Secondary | ICD-10-CM | POA: Diagnosis not present

## 2023-05-28 LAB — BASIC METABOLIC PANEL
BUN: 10 mg/dL (ref 6–23)
CO2: 29 meq/L (ref 19–32)
Calcium: 9.8 mg/dL (ref 8.4–10.5)
Chloride: 102 meq/L (ref 96–112)
Creatinine, Ser: 0.87 mg/dL (ref 0.40–1.50)
GFR: 81.01 mL/min (ref >60.00)
Glucose, Bld: 121 mg/dL — ABNORMAL HIGH (ref 70–99)
Potassium: 3.9 meq/L (ref 3.5–5.1)
Sodium: 138 meq/L (ref 135–145)

## 2023-05-28 LAB — CBC WITH DIFFERENTIAL/PLATELET
Basophils Absolute: 0 10*3/uL (ref 0.0–0.1)
Basophils Relative: 1.1 % (ref 0.0–3.0)
Eosinophils Absolute: 0.2 10*3/uL (ref 0.0–0.7)
Eosinophils Relative: 3.9 % (ref 0.0–5.0)
HCT: 38.8 % — ABNORMAL LOW (ref 39.0–52.0)
Hemoglobin: 12.5 g/dL — ABNORMAL LOW (ref 13.0–17.0)
Lymphocytes Relative: 30.3 % (ref 12.0–46.0)
Lymphs Abs: 1.2 10*3/uL (ref 0.7–4.0)
MCHC: 32.1 g/dL (ref 30.0–36.0)
MCV: 81.8 fL (ref 78.0–100.0)
Monocytes Absolute: 0.6 10*3/uL (ref 0.1–1.0)
Monocytes Relative: 14.6 % — ABNORMAL HIGH (ref 3.0–12.0)
Neutro Abs: 1.9 10*3/uL (ref 1.4–7.7)
Neutrophils Relative %: 50.1 % (ref 43.0–77.0)
Platelets: 120 10*3/uL — ABNORMAL LOW (ref 150.0–400.0)
RBC: 4.75 Mil/uL (ref 4.22–5.81)
RDW: 15.2 % (ref 11.5–15.5)
WBC: 3.9 10*3/uL — ABNORMAL LOW (ref 4.0–10.5)

## 2023-05-28 LAB — HEPATIC FUNCTION PANEL
ALT: 11 U/L (ref 0–53)
AST: 11 U/L (ref 0–37)
Albumin: 4.6 g/dL (ref 3.5–5.2)
Alkaline Phosphatase: 69 U/L (ref 39–117)
Bilirubin, Direct: 0.2 mg/dL (ref 0.0–0.3)
Total Bilirubin: 0.7 mg/dL (ref 0.2–1.2)
Total Protein: 6.5 g/dL (ref 6.0–8.3)

## 2023-05-28 LAB — URINALYSIS, ROUTINE W REFLEX MICROSCOPIC
Bilirubin Urine: NEGATIVE
Hgb urine dipstick: NEGATIVE
Ketones, ur: NEGATIVE
Leukocytes,Ua: NEGATIVE
Nitrite: NEGATIVE
RBC / HPF: NONE SEEN (ref 0–?)
Specific Gravity, Urine: >=1.03 — AB (ref 1.000–1.030)
Total Protein, Urine: NEGATIVE
Urine Glucose: NEGATIVE
Urobilinogen, UA: 0.2 (ref 0.0–1.0)
WBC, UA: NONE SEEN (ref 0–?)
pH: 5.5 (ref 5.0–8.0)

## 2023-05-28 LAB — HEMOGLOBIN A1C: Hgb A1c MFr Bld: 6.2 % (ref 4.6–6.5)

## 2023-05-28 LAB — MICROALBUMIN / CREATININE URINE RATIO
Creatinine,U: 153 mg/dL
Microalb Creat Ratio: 0.5 mg/g (ref 0.0–30.0)
Microalb, Ur: 0.7 mg/dL (ref 0.0–1.9)

## 2023-05-28 LAB — LIPID PANEL
Cholesterol: 130 mg/dL (ref 0–200)
HDL: 60.7 mg/dL (ref >39.00)
LDL Cholesterol: 56 mg/dL (ref 0–99)
NonHDL: 69.41
Total CHOL/HDL Ratio: 2
Triglycerides: 65 mg/dL (ref 0.0–149.0)
VLDL: 13 mg/dL (ref 0.0–40.0)

## 2023-05-28 LAB — VITAMIN B12: Vitamin B-12: >1537 pg/mL — ABNORMAL HIGH (ref 211–911)

## 2023-05-28 LAB — TSH: TSH: 1.34 u[IU]/mL (ref 0.35–5.50)

## 2023-05-28 LAB — VITAMIN D 25 HYDROXY (VIT D DEFICIENCY, FRACTURES): VITD: 32.57 ng/mL (ref 30.00–100.00)

## 2023-05-28 NOTE — Assessment & Plan Note (Signed)
BP Readings from Last 3 Encounters:  05/28/23 122/66  12/03/22 118/78  10/07/22 (!) 158/79   Stable, pt to continue medical treatment norvasc 5 every day, losartan 50 qd

## 2023-05-28 NOTE — Assessment & Plan Note (Signed)
Lab Results  Component Value Date   VITAMINB12 1,293 (H) 08/01/2022   Stable, cont oral replacement - b12 1000 mcg qd

## 2023-05-28 NOTE — Patient Instructions (Signed)
Please continue all other medications as before, and refills have been done if requested.  Please have the pharmacy call with any other refills you may need.  Please continue your efforts at being more active, low cholesterol diet, and weight control.  Please keep your appointments with your specialists as you may have planned  You will be contacted regarding the referral for: Cardiac CT score  Please go to the LAB at the blood drawing area for the tests to be done  You will be contacted by phone if any changes need to be made immediately.  Otherwise, you will receive a letter about your results with an explanation, but please check with MyChart first.  Please make an Appointment to return in 6 months, or sooner if needed

## 2023-05-28 NOTE — Progress Notes (Signed)
Patient ID: Alex Massey, male   DOB: 1941/02/19, 82 y.o.   MRN: [id number]        Chief Complaint: follow up HTN, HLD and DM, low vit D and b12       HPI:  Alex Massey is a 82 y.o. male here overall doing ok,  Pt denies chest pain, increased sob or doe, wheezing, orthopnea, PND, increased LE swelling, palpitations, dizziness or syncope.   Pt denies polydipsia, polyuria, or new focal neuro s/s.    Pt denies fever, wt loss, night sweats, loss of appetite, or other constitutional symptoms  Does have several wks ongoing nasal allergy symptoms with clearish congestion, itch and sneezing, without fever, pain, ST, cough, swelling or wheezing.        Wt Readings from Last 3 Encounters:  05/28/23 150 lb (68 kg)  12/03/22 154 lb (69.9 kg)  08/02/22 158 lb (71.7 kg)   BP Readings from Last 3 Encounters:  05/28/23 122/66  12/03/22 118/78  10/07/22 (!) 158/79         Past Medical History:  Diagnosis Date   Acquired right foot drop    post lumbar surgery,  wears brace   Allergy    B12 deficiency    Chronic constipation    diet   ED (erectile dysfunction)    History of kidney stones    Hyperlipidemia    Hyperplasia of prostate with lower urinary tract symptoms (LUTS)    Hypertension    Insomnia    Neuropathy, peripheral    right foot   Presence of surgical incision 01/21/2019   s/p  left carpal tunnel release --  pt has dressing on,  denies pain or numbness   Prostate cancer [facility]) urologist-- dr eskridge/  oncologist-  dr Kathrynn Running   dx 08-04-2018,  Stage T1c, Gleason 4+4   Renal cyst, right    Type 2 diabetes mellitus (HCC)    followed by pcp   Wears partial dentures    upper and lower   Past Surgical History:  Procedure Laterality Date   CARPAL TUNNEL RELEASE Bilateral left 01-21-2019;  right 1980s   CATARACT EXTRACTION Left 03/2020   CYSTOSCOPY N/A 01/26/2019   Procedure: CYSTOSCOPY FLEXIBLE;  Surgeon: Jerilee Field, MD;  Location: [facility];   Service: Urology;  Laterality: N/A;  no seeds found in bladder   LUMBAR DISC SURGERY  1984  L4-5;  1998 L4--S1   LUMBAR EPIDURAL INJECTION  2009, 2006   dr Alvester Morin   LUMBAR MICRODISCECTOMY  05-13-2002  dr Criss Alvine  @[facility]    L4-5   RADIOACTIVE SEED IMPLANT N/A 01/26/2019   Procedure: RADIOACTIVE SEED IMPLANT/BRACHYTHERAPY IMPLANT;  Surgeon: Jerilee Field, MD;  Location: [facility];  Service: Urology;  Laterality: N/A;    54 seeds implanted   SPACE OAR INSTILLATION N/A 01/26/2019   Procedure: SPACE OAR INSTILLATION;  Surgeon: Jerilee Field, MD;  Location: [facility];  Service: Urology;  Laterality: N/A;    reports that he quit smoking about 20 years ago. His smoking use included cigarettes. He started smoking about 40 years ago. He has never used smokeless tobacco. He reports current alcohol use of about 3.0 standard drinks of alcohol per week. He reports that he does not use drugs. family history includes Colon cancer (age of onset: 60) in his brother; Diabetes in his father; Healthy in his mother; Prostate cancer in his brother, cousin, and nephew. Allergies  Allergen Reactions   Metformin And Related Other (  See Comments)    GI upset, diarrhea, wt loss   Current Outpatient Medications on File Prior to Visit  Medication Sig Dispense Refill   acarbose (PRECOSE) 50 MG tablet TAKE 1 TABLET BY MOUTH 3 TIMES DAILY WITH MEALS 270 tablet 1   amLODipine (NORVASC) 5 MG tablet Take 1 tablet (5 mg total) by mouth daily. 90 tablet 3   aspirin EC 81 MG tablet Take 1 tablet (81 mg total) by mouth daily. 150 tablet 2   blood glucose meter kit and supplies KIT Inject 1 each into the skin 2 (two) times daily. Dispense based on patient and insurance preference. Use up to four times daily as directed. (FOR ICD-9 250.00, 250.01). 1 each 0   Blood Glucose Monitoring Suppl (FREESTYLE LITE) DEVI USE TO CHECK GLUCOSE UP TO 4 TIMES DAILY AS DIRECTED     Blood Glucose Monitoring Suppl  (ONE TOUCH ULTRA 2) w/Device KIT Use to check bloods sugars twice a day 1 kit 0   Cholecalciferol (VITAMIN D) 50 MCG (2000 UT) CAPS Take 1 capsule by mouth daily.     glipiZIDE (GLUCOTROL XL) 5 MG 24 hr tablet Take 1 tablet (5 mg total) by mouth daily with breakfast. 90 tablet 3   glucose blood (ONETOUCH ULTRA) test strip Use to check blood sugars twice a day 100 each 12   JANUVIA 100 MG tablet TAKE ONE TABLET BY MOUTH EVERY DAY 90 tablet 2   losartan (COZAAR) 50 MG tablet Take 1 tablet (50 mg total) by mouth in the morning and at bedtime. 180 tablet 2   meloxicam (MOBIC) 15 MG tablet TAKE 1 TABLET(15 MG) BY MOUTH DAILY 30 tablet 3   OneTouch Delica Lancets 30G MISC Use to check blood sugars twice a day 100 each 1   oxybutynin (DITROPAN) 5 MG tablet Take 1 tablet (5 mg total) by mouth at bedtime. 90 tablet 3   predniSONE (DELTASONE) 50 MG tablet Take 1 tablet daily with food for 5 days until finished 5 tablet 0   simvastatin (ZOCOR) 20 MG tablet TAKE 1 TABLET BY MOUTH EVERY NIGHT AT BEDTIME 90 tablet 1   tamsulosin (FLOMAX) 0.4 MG CAPS capsule Take 1 capsule (0.4 mg total) by mouth daily. 90 capsule 2   triamcinolone (NASACORT) 55 MCG/ACT AERO nasal inhaler Place 2 sprays into the nose daily. 1 each 12   vitamin B-12 (CYANOCOBALAMIN) 1000 MCG tablet Take 1,000 mcg by mouth daily.     No current facility-administered medications on file prior to visit.        ROS:  All others reviewed and negative.  Objective        PE:  BP 122/66 (BP Location: Right Arm, Patient Position: Sitting, Cuff Size: Normal)   Pulse 82   Temp 98 F (36.7 C) (Oral)   Ht 5\' 6"  (1.676 m)   Wt 150 lb (68 kg)   SpO2 98%   BMI 24.21 kg/m                 Constitutional: Pt appears in NAD               HENT: Head: NCAT.                Right Ear: External ear normal.                 Left Ear: External ear normal.                Eyes: .  Pupils are equal, round, and reactive to light. Conjunctivae and EOM are normal                Nose: without d/c or deformity               Neck: Neck supple. Gross normal ROM               Cardiovascular: Normal rate and regular rhythm.                 Pulmonary/Chest: Effort normal and breath sounds without rales or wheezing.                Abd:  Soft, NT, ND, + BS, no organomegaly               Neurological: Pt is alert. At baseline orientation, motor grossly intact               Skin: Skin is warm. No rashes, no other new lesions, LE edema - none               Psychiatric: Pt behavior is normal without agitation   Micro: none  Cardiac tracings I have personally interpreted today:  none  Pertinent Radiological findings (summarize): none   Lab Results  Component Value Date   WBC 5.1 08/01/2022   HGB 12.3 (L) 08/01/2022   HCT 37.9 (L) 08/01/2022   PLT 132.0 (L) 08/01/2022   GLUCOSE 127 (H) 12/03/2022   CHOL 143 12/03/2022   TRIG 89.0 12/03/2022   HDL 83.20 12/03/2022   LDLCALC 42 12/03/2022   ALT 13 12/03/2022   AST 15 12/03/2022   NA 137 12/03/2022   K 3.8 12/03/2022   CL 102 12/03/2022   CREATININE 0.77 12/03/2022   BUN 11 12/03/2022   CO2 26 12/03/2022   TSH 1.54 08/01/2022   PSA 0.01 (L) 11/27/2020   INR 1.0 01/22/2019   HGBA1C 6.5 12/03/2022   MICROALBUR 2.0 (H) 08/01/2022   Assessment/Plan:  Alex Massey is a 82 y.o. Black or African American [2] male with  has a past medical history of Acquired right foot drop, Allergy, B12 deficiency, Chronic constipation, ED (erectile dysfunction), History of kidney stones, Hyperlipidemia, Hyperplasia of prostate with lower urinary tract symptoms (LUTS), Hypertension, Insomnia, Neuropathy, peripheral, Presence of surgical incision (01/21/2019), Prostate cancer [facility]) (urologist-- dr eskridge/  oncologist-  dr Kathrynn Running), Renal cyst, right, Type 2 diabetes mellitus (HCC), and Wears partial dentures.  Vitamin D deficiency Last vitamin D Lab Results  Component Value Date   VD25OH 34.83 08/01/2022   Low, to  start oral replacement   Vitamin B12 deficiency Lab Results  Component Value Date   VITAMINB12 1,293 (H) 08/01/2022   Stable, cont oral replacement - b12 1000 mcg qd   Hyperlipidemia Lab Results  Component Value Date   LDLCALC 42 12/03/2022   Stable, pt to continue current statin zocor 20 every day, also for card ct score  Essential hypertension BP Readings from Last 3 Encounters:  05/28/23 122/66  12/03/22 118/78  10/07/22 (!) 158/79   Stable, pt to continue medical treatment norvasc 5 every day, losartan 50 qd   Diabetes (HCC) Lab Results  Component Value Date   HGBA1C 6.5 12/03/2022   Stable, pt to continue current medical treatment precose 50 tid, glucotorl xl 5 every day, januvia 50 qd  Allergic rhinitis ,Mild to mod, for restart nasacort asd, to f/u any worsening symptoms or concerns  Followup: Return in about 6  months (around 11/25/2023).  Oliver Barre, MD 05/28/2023 9:45 AM [facility]

## 2023-05-28 NOTE — Assessment & Plan Note (Signed)
Lab Results  Component Value Date   HGBA1C 6.5 12/03/2022   Stable, pt to continue current medical treatment precose 50 tid, glucotorl xl 5 every day, januvia 50 qd

## 2023-05-28 NOTE — Assessment & Plan Note (Signed)
,  Mild to mod, for restart nasacort asd, to f/u any worsening symptoms or concerns

## 2023-05-28 NOTE — Assessment & Plan Note (Addendum)
Lab Results  Component Value Date   LDLCALC 42 12/03/2022   Stable, pt to continue current statin zocor 20 every day, also for card ct score

## 2023-05-28 NOTE — Assessment & Plan Note (Signed)
Last vitamin D Lab Results  Component Value Date   VD25OH 34.83 08/01/2022   Low, to start oral replacement  

## 2023-06-03 ENCOUNTER — Other Ambulatory Visit: Payer: Self-pay | Admitting: Internal Medicine

## 2023-06-04 ENCOUNTER — Encounter (HOSPITAL_COMMUNITY): Payer: Self-pay

## 2023-06-04 ENCOUNTER — Encounter: Payer: Self-pay | Admitting: Internal Medicine

## 2023-06-04 ENCOUNTER — Ambulatory Visit (HOSPITAL_COMMUNITY)
Admission: RE | Admit: 2023-06-04 | Discharge: 2023-06-04 | Disposition: A | Discharge status: Home / Self Care | Payer: Medicare Other | Source: Ambulatory Visit | Attending: Internal Medicine | Admitting: Internal Medicine

## 2023-06-04 DIAGNOSIS — R9431 Abnormal electrocardiogram [ECG] [EKG]: Secondary | ICD-10-CM | POA: Insufficient documentation

## 2023-06-04 DIAGNOSIS — E782 Mixed hyperlipidemia: Secondary | ICD-10-CM | POA: Insufficient documentation

## 2023-06-04 DIAGNOSIS — I10 Essential (primary) hypertension: Secondary | ICD-10-CM | POA: Insufficient documentation

## 2023-06-04 DIAGNOSIS — E1165 Type 2 diabetes mellitus with hyperglycemia: Secondary | ICD-10-CM | POA: Insufficient documentation

## 2023-06-09 ENCOUNTER — Other Ambulatory Visit: Payer: Self-pay | Admitting: Internal Medicine

## 2023-06-09 ENCOUNTER — Other Ambulatory Visit: Payer: Self-pay

## 2023-06-09 DIAGNOSIS — E1165 Type 2 diabetes mellitus with hyperglycemia: Secondary | ICD-10-CM

## 2023-06-10 ENCOUNTER — Other Ambulatory Visit: Payer: Self-pay | Admitting: Internal Medicine

## 2023-06-12 ENCOUNTER — Telehealth: Payer: Self-pay | Admitting: Internal Medicine

## 2023-06-12 NOTE — Telephone Encounter (Signed)
There should be another refill for him to pick up at he pharmacy. Pt needs to contact pharmacy.

## 2023-06-12 NOTE — Telephone Encounter (Signed)
Patient said his refill for simvastatin (ZOCOR) 20 MG tablet was denied because it is too soon for a refill. However, the patient said he has 6 pills left. He would like to know if a refill can be sent in for him. Best callback is [phone].

## 2023-06-17 ENCOUNTER — Other Ambulatory Visit: Payer: Self-pay | Admitting: Internal Medicine

## 2023-06-21 ENCOUNTER — Other Ambulatory Visit: Payer: Self-pay | Admitting: Internal Medicine

## 2023-06-24 ENCOUNTER — Ambulatory Visit (INDEPENDENT_AMBULATORY_CARE_PROVIDER_SITE_OTHER): Payer: Medicare Other | Admitting: Family

## 2023-06-24 ENCOUNTER — Encounter: Payer: Self-pay | Admitting: Family

## 2023-06-24 DIAGNOSIS — M25462 Effusion, left knee: Secondary | ICD-10-CM

## 2023-06-24 NOTE — Progress Notes (Signed)
Office Visit Note   Patient: Alex Massey           Date of Birth: 1940/07/30           MRN: [id number] Visit Date: 06/24/2023              Requested by: Corwin Levins, MD [address] [place],  [place] [address] PCP: Corwin Levins, MD  Chief Complaint  Patient presents with   Left Knee - Pain    S/p injection 04/15/2023      HPI: The patient is an 82 year old gentleman who is seen in follow-up for left knee pain.  He reports that the injection worked well his swelling and pain have resolved he is back to walking 2 miles a day only complains of minimal knee pain on the left.  Assessment & Plan: Visit Diagnoses: No diagnosis found.  Plan: He will follow-up as needed.  Let us know when he has had return of his symptoms or would like another injection.  Follow-Up Instructions: No follow-ups on file.   Left Knee Exam   Muscle Strength  The patient has normal left knee strength.  Tenderness  The patient is experiencing no tenderness.   Range of Motion  The patient has normal left knee ROM.  Tests  Varus: negative Valgus: negative  Other  Swelling: none Effusion: no effusion present      Patient is alert, oriented, no adenopathy, well-dressed, normal affect, normal respiratory effort.   Imaging: No results found. No images are attached to the encounter.  Labs: Lab Results  Component Value Date   HGBA1C 6.2 05/28/2023   HGBA1C 6.5 12/03/2022   HGBA1C 6.7 (H) 08/01/2022   ESRSEDRATE 15 05/30/2020   LABURIC 4.9 09/26/2017     Lab Results  Component Value Date   ALBUMIN 4.6 05/28/2023   ALBUMIN 4.4 12/03/2022   ALBUMIN 4.6 08/01/2022    No results found for: "MG" Lab Results  Component Value Date   VD25OH 32.57 05/28/2023   VD25OH 34.83 08/01/2022   VD25OH 42.32 12/04/2021    No results found for: "PREALBUMIN"    Latest Ref Rng & Units 05/28/2023    9:36 AM 08/01/2022    3:13 PM 12/04/2021   11:57 AM  CBC EXTENDED  WBC 4.0 - 10.5  K/uL 3.9  5.1  4.1   RBC 4.22 - 5.81 Mil/uL 4.75  4.71  4.75   Hemoglobin 13.0 - 17.0 g/dL 44.0  10.2  72.5   HCT 39.0 - 52.0 % 38.8  37.9  38.3   Platelets 150.0 - 400.0 K/uL 120.0  132.0  127.0   NEUT# 1.4 - 7.7 K/uL 1.9  2.9  2.5   Lymph# 0.7 - 4.0 K/uL 1.2  1.4  1.0      There is no height or weight on file to calculate BMI.  Orders:  No orders of the defined types were placed in this encounter.  No orders of the defined types were placed in this encounter.    Procedures: No procedures performed  Clinical Data: No additional findings.  ROS:  All other systems negative, except as noted in the HPI. Review of Systems  Objective: Vital Signs: There were no vitals taken for this visit.  Specialty Comments:  EXAM: MRI LUMBAR SPINE WITHOUT CONTRAST   TECHNIQUE: Multiplanar, multisequence MR imaging of the lumbar spine was performed. No intravenous contrast was administered.   COMPARISON:  07/17/2014   FINDINGS: Segmentation:  Standard lumbar numbering  Alignment:  Physiologic   Vertebrae: Horizontal marrow edema with hypointense fracture line at L3. Height loss is minimal. No evidence of underlying lesion. Prominent fatty marrow at the level of the lower lumbar spine and sacrum, chronic and presumably from prostate cancer treatment.   Conus medullaris and cauda equina: Conus extends to the L1 level. Conus and cauda equina appear normal.   Paraspinal and other soft tissues: Scarring at the level of L4 and L5.   Disc levels:   T12- L1: Unremarkable.   L1-L2: Unremarkable.   L2-L3: Mild disc narrowing and bulging.   L3-L4: Disc narrowing and bulging borderline facet spurring.   L4-L5: Disc narrowing and right eccentric bulging. Prior right-sided laminotomy. The canal and foramina are patent   L5-S1:Greatest level of disc narrowing with bulging and ridging. Negative facets. Noncompressive left foraminal narrowing. Patent spinal canal.    IMPRESSION: 1. L3 compression fracture with slight height loss. 2. Noncompressive disc degeneration as described. Prior right-sided decompression at L4-5.     Electronically Signed   By: Marnee Spring M.D.   On: 02/27/2020 09:12  PMFS History: Patient Active Problem List   Diagnosis Date Noted   Pain and swelling of left knee 08/01/2022   COVID-19 virus infection 05/16/2021   Allergic rhinitis 05/16/2021   Vitamin D deficiency 12/03/2020   Congestion of nasal sinus 08/24/2020   Dysphagia 06/04/2020   Night sweats 05/30/2020   Hematochezia 05/30/2020   Right foot drop 02/02/2020   Paresthesia of skin 02/02/2020   Status post carpal tunnel release 02/04/2019   Left carpal tunnel syndrome 11/29/2018   Malignant neoplasm of prostate (HCC) 09/03/2018   Vitamin B12 deficiency 02/13/2018   Lumbar radiculopathy 07/09/2016   Radiculopathy due to lumbar intervertebral disc disorder 07/09/2016   Hemorrhoid 08/07/2015   BPH without obstruction/lower urinary tract symptoms 02/01/2015   Essential hypertension 09/12/2014   Encounter for well adult exam with abnormal findings 01/14/2011   ERECTILE DYSFUNCTION 04/04/2008   Hyperlipidemia 06/26/2007   Diabetes (HCC) 06/25/2007   NEPHROLITHIASIS, HX OF 06/25/2007   Past Medical History:  Diagnosis Date   Acquired right foot drop    post lumbar surgery,  wears brace   Allergy    B12 deficiency    Chronic constipation    diet   ED (erectile dysfunction)    History of kidney stones    Hyperlipidemia    Hyperplasia of prostate with lower urinary tract symptoms (LUTS)    Hypertension    Insomnia    Neuropathy, peripheral    right foot   Presence of surgical incision 01/21/2019   s/p  left carpal tunnel release --  pt has dressing on,  denies pain or numbness   Prostate cancer [facility]) urologist-- dr eskridge/  oncologist-  dr Kathrynn Running   dx 08-04-2018,  Stage T1c, Gleason 4+4   Renal cyst, right    Type 2 diabetes mellitus (HCC)     followed by pcp   Wears partial dentures    upper and lower    Family History  Problem Relation Age of Onset   Healthy Mother    Diabetes Father    Prostate cancer Brother    Colon cancer Brother 70   Prostate cancer Cousin        maternal   Prostate cancer Nephew    Rectal cancer Neg Hx    Stomach cancer Neg Hx     Past Surgical History:  Procedure Laterality Date   CARPAL TUNNEL RELEASE Bilateral left 01-21-2019;  right 1980s   CATARACT EXTRACTION Left 03/2020   CYSTOSCOPY N/A 01/26/2019   Procedure: CYSTOSCOPY FLEXIBLE;  Surgeon: Jerilee Field, MD;  Location: [facility];  Service: Urology;  Laterality: N/A;  no seeds found in bladder   LUMBAR DISC SURGERY  1984  L4-5;  1998 L4--S1   LUMBAR EPIDURAL INJECTION  2009, 2006   dr Alvester Morin   LUMBAR MICRODISCECTOMY  05-13-2002  dr Criss Alvine  @[facility]    L4-5   RADIOACTIVE SEED IMPLANT N/A 01/26/2019   Procedure: RADIOACTIVE SEED IMPLANT/BRACHYTHERAPY IMPLANT;  Surgeon: Jerilee Field, MD;  Location: [facility];  Service: Urology;  Laterality: N/A;    54 seeds implanted   SPACE OAR INSTILLATION N/A 01/26/2019   Procedure: SPACE OAR INSTILLATION;  Surgeon: Jerilee Field, MD;  Location: [facility];  Service: Urology;  Laterality: N/A;   Social History   Occupational History    Comment: retired  Tobacco Use   Smoking status: Former    Current packs/day: 0.00    Types: Cigarettes    Start date: 01/25/1983    Quit date: 01/25/2003    Years since quitting: 20.4   Smokeless tobacco: Never  Vaping Use   Vaping status: Never Used  Substance and Sexual Activity   Alcohol use: Yes    Alcohol/week: 3.0 standard drinks of alcohol    Types: 3 Glasses of wine per week   Drug use: Never   Sexual activity: Not on file

## 2023-06-27 ENCOUNTER — Other Ambulatory Visit: Payer: Self-pay | Admitting: Internal Medicine

## 2023-07-24 ENCOUNTER — Ambulatory Visit: Payer: Medicare Other

## 2023-07-24 VITALS — BP 122/70 | HR 71 | Ht 66.0 in | Wt 152.4 lb

## 2023-07-24 DIAGNOSIS — E782 Mixed hyperlipidemia: Secondary | ICD-10-CM | POA: Diagnosis not present

## 2023-07-24 DIAGNOSIS — Z0000 Encounter for general adult medical examination without abnormal findings: Secondary | ICD-10-CM

## 2023-07-24 DIAGNOSIS — N400 Enlarged prostate without lower urinary tract symptoms: Secondary | ICD-10-CM

## 2023-07-24 DIAGNOSIS — Z0001 Encounter for general adult medical examination with abnormal findings: Secondary | ICD-10-CM | POA: Diagnosis not present

## 2023-07-24 NOTE — Progress Notes (Signed)
Subjective:   Alex Massey is a 83 y.o. male who presents for Medicare Annual/Subsequent preventive examination.  Visit Complete: In person   Cardiac Risk Factors include: advanced age (>79men, >52 women);hypertension;male gender;diabetes mellitus;Other (see comment);dyslipidemia, Risk factor comments: BPH     Objective:    Today's Vitals   07/24/23 0919  BP: 122/70  Pulse: 71  SpO2: 100%  Weight: 152 lb 6.4 oz (69.1 kg)  Height: 5\' 6"  (1.676 m)   Body mass index is 24.6 kg/m.     07/24/2023    8:17 AM 08/02/2022    2:11 PM 07/20/2021    8:29 AM 03/27/2021    7:20 AM 05/31/2020    8:20 AM 01/26/2019   11:50 AM 01/20/2015    8:40 AM  Advanced Directives  Does Patient Have a Medical Advance Directive? Yes Yes Yes No No Yes No  Type of Estate agent of [employer];Living will Healthcare Power of [place];Living will Living will   Living will   Does patient want to make changes to medical advance directive?      Yes (MAU/Ambulatory/Procedural Areas - Information given)   Copy of Healthcare Power of Attorney in Chart? No - copy requested No - copy requested       Would patient like information on creating a medical advance directive?    No - Patient declined No - Patient declined  No - patient declined information    Current Medications (verified) Outpatient Encounter Medications as of 07/24/2023  Medication Sig   acarbose (PRECOSE) 50 MG tablet TAKE 1 TABLET BY MOUTH 3 TIMES DAILY WITH MEALS   amLODipine (NORVASC) 5 MG tablet Take 1 tablet (5 mg total) by mouth daily.   aspirin EC 81 MG tablet Take 1 tablet (81 mg total) by mouth daily.   blood glucose meter kit and supplies KIT Inject 1 each into the skin 2 (two) times daily. Dispense based on patient and insurance preference. Use up to four times daily as directed. (FOR ICD-9 250.00, 250.01).   Blood Glucose Monitoring Suppl (FREESTYLE LITE) DEVI USE TO CHECK GLUCOSE UP TO 4 TIMES DAILY AS DIRECTED    Blood Glucose Monitoring Suppl (ONE TOUCH ULTRA 2) w/Device KIT Use to check bloods sugars twice a day   Cholecalciferol (VITAMIN D) 50 MCG (2000 UT) CAPS Take 1 capsule by mouth daily.   glipiZIDE (GLUCOTROL XL) 5 MG 24 hr tablet Take 1 tablet (5 mg total) by mouth daily with breakfast.   glucose blood (ONETOUCH ULTRA) test strip USE 1 STRIP TO CHECK GLUCOSE TWICE DAILY   JANUVIA 100 MG tablet TAKE ONE TABLET BY MOUTH EVERY DAY   losartan (COZAAR) 50 MG tablet Take 1 tablet (50 mg total) by mouth in the morning and at bedtime.   meloxicam (MOBIC) 15 MG tablet TAKE 1 TABLET(15 MG) BY MOUTH DAILY   OneTouch Delica Lancets 30G MISC USE 1  TO CHECK GLUCOSE TWICE DAILY   oxybutynin (DITROPAN) 5 MG tablet Take 1 tablet (5 mg total) by mouth at bedtime.   predniSONE (DELTASONE) 50 MG tablet Take 1 tablet daily with food for 5 days until finished   simvastatin (ZOCOR) 20 MG tablet TAKE 1 TABLET BY MOUTH EVERY NIGHT AT BEDTIME   tamsulosin (FLOMAX) 0.4 MG CAPS capsule Take 1 capsule (0.4 mg total) by mouth daily.   triamcinolone (NASACORT) 55 MCG/ACT AERO nasal inhaler Place 2 sprays into the nose daily.   vitamin B-12 (CYANOCOBALAMIN) 1000 MCG tablet Take 1,000 mcg  by mouth daily.   No facility-administered encounter medications on file as of 07/24/2023.    Allergies (verified) Metformin and related   History: Past Medical History:  Diagnosis Date   Acquired right foot drop    post lumbar surgery,  wears brace   Allergy    B12 deficiency    Chronic constipation    diet   ED (erectile dysfunction)    History of kidney stones    Hyperlipidemia    Hyperplasia of prostate with lower urinary tract symptoms (LUTS)    Hypertension    Insomnia    Neuropathy, peripheral    right foot   Presence of surgical incision 01/21/2019   s/p  left carpal tunnel release --  pt has dressing on,  denies pain or numbness   Prostate cancer [facility]) urologist-- dr eskridge/  oncologist-  dr Kathrynn Running   dx  08-04-2018,  Stage T1c, Gleason 4+4   Renal cyst, right    Type 2 diabetes mellitus (HCC)    followed by pcp   Wears partial dentures    upper and lower   Past Surgical History:  Procedure Laterality Date   CARPAL TUNNEL RELEASE Bilateral left 01-21-2019;  right 1980s   CATARACT EXTRACTION Left 03/2020   CYSTOSCOPY N/A 01/26/2019   Procedure: CYSTOSCOPY FLEXIBLE;  Surgeon: Jerilee Field, MD;  Location: [facility];  Service: Urology;  Laterality: N/A;  no seeds found in bladder   LUMBAR DISC SURGERY  1984  L4-5;  1998 L4--S1   LUMBAR EPIDURAL INJECTION  2009, 2006   dr Alvester Morin   LUMBAR MICRODISCECTOMY  05-13-2002  dr Criss Alvine  @[facility]    L4-5   RADIOACTIVE SEED IMPLANT N/A 01/26/2019   Procedure: RADIOACTIVE SEED IMPLANT/BRACHYTHERAPY IMPLANT;  Surgeon: Jerilee Field, MD;  Location: [facility];  Service: Urology;  Laterality: N/A;    54 seeds implanted   SPACE OAR INSTILLATION N/A 01/26/2019   Procedure: SPACE OAR INSTILLATION;  Surgeon: Jerilee Field, MD;  Location: [facility];  Service: Urology;  Laterality: N/A;   Family History  Problem Relation Age of Onset   Healthy Mother    Diabetes Father    Prostate cancer Brother    Colon cancer Brother 79   Prostate cancer Cousin        maternal   Prostate cancer Nephew    Rectal cancer Neg Hx    Stomach cancer Neg Hx    Social History   Socioeconomic History   Marital status: Married    Spouse name: Dell Ponto   Number of children: 2   Years of education: 14   Highest education level: Not on file  Occupational History    Comment: retired  Tobacco Use   Smoking status: Former    Current packs/day: 0.00    Types: Cigarettes    Start date: 01/25/1983    Quit date: 01/25/2003    Years since quitting: 20.5   Smokeless tobacco: Never  Vaping Use   Vaping status: Never Used  Substance and Sexual Activity   Alcohol use: Yes    Alcohol/week: 3.0 standard drinks of alcohol    Types:  3 Glasses of wine per week   Drug use: Never   Sexual activity: Not on file  Other Topics Concern   Not on file  Social History Narrative   HSG 2 years trade school. married 1965. 2 daughters- '66, '71. 2 grandchldren. 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  Lives with wife-2025   Social Drivers of Health   Financial Resource Strain: Low Risk  (07/24/2023)   Overall Financial Resource Strain (CARDIA)    Difficulty of Paying Living Expenses: Not hard at all  Food Insecurity: No Food Insecurity (07/24/2023)   Hunger Vital Sign    Worried About Running Out of Food in the Last Year: Never true    Ran Out of Food in the Last Year: Never true  Transportation Needs: No Transportation Needs (07/24/2023)   PRAPARE - Administrator, Civil Service (Medical): No    Lack of Transportation (Non-Medical): No  Physical Activity: Sufficiently Active (07/24/2023)   Exercise Vital Sign    Days of Exercise per Week: 7 days    Minutes of Exercise per Session: 50 min  Stress: No Stress Concern Present (07/24/2023)   [employer] of Occupational Health - Occupational Stress Questionnaire    Feeling of Stress : Not at all  Social Connections: Moderately Integrated (07/24/2023)   Social Connection and Isolation Panel [NHANES]    Frequency of Communication with Friends and Family: Never    Frequency of Social Gatherings with Friends and Family: Once a week    Attends Religious Services: More than 4 times per year    Active Member of [employer] or Organizations: Yes    Attends Banker Meetings: Never    Marital Status: Married    Tobacco Counseling Counseling given: Not Answered   Clinical Intake:  Pre-visit preparation completed: Yes  Pain : No/denies pain     Diabetes: Yes CBG done?: Yes (134) CBG resulted in Enter/ Edit results?: No Did pt. bring in CBG monitor from home?: No  How often do you need to have someone help you when you read  instructions, pamphlets, or other written materials from your doctor or pharmacy?: 1 - Never  Interpreter Needed?: No  Information entered by :: Tan Clopper, RMA   Activities of Daily Living    07/24/2023    7:56 AM 08/02/2022    2:12 PM  In your present state of health, do you have any difficulty performing the following activities:  Hearing? 0 0  Vision? 0 0  Difficulty concentrating or making decisions? 0 0  Walking or climbing stairs? 0 0  Dressing or bathing? 0 0  Doing errands, shopping? 0 0  Preparing Food and eating ? N N  Using the Toilet? N N  In the past six months, have you accidently leaked urine? N N  Do you have problems with loss of bowel control? N N  Managing your Medications? N N  Managing your Finances? N N  Housekeeping or managing your Housekeeping? N N    Patient Care Team: Corwin Levins, MD as PCP - General (Internal Medicine) Nadara Mustard, MD (Orthopedic Surgery) Hart Carwin, MD (Inactive) (Gastroenterology) Janet Berlin, MD (Ophthalmology) Tyrell Antonio, MD (Physical Medicine and Rehabilitation) Szabat, Vinnie Level, [facility] (Inactive) as Pharmacist (Pharmacist)  Indicate any recent Medical Services you may have received from other than [facility] providers in the past year (date may be approximate).     Assessment:   This is a routine wellness examination for [facility].  Hearing/Vision screen Hearing Screening - Comments:: Denies hearing difficulties   Vision Screening - Comments:: Denies vision issues.    Goals Addressed             This Visit's Progress    My goal for 2024 is to stay blessed, independent, physically active and  socially involved.   On track     Depression Screen    07/24/2023    8:27 AM 05/28/2023    8:28 AM 12/03/2022    9:07 AM 08/02/2022    2:11 PM 08/01/2022    2:56 PM 08/01/2022    2:28 PM 12/04/2021   11:35 AM  PHQ 2/9 Scores  PHQ - 2 Score 0 0 0 0 0 0 0  PHQ- 9 Score 2   0  0     Fall Risk    07/24/2023     8:18 AM 05/28/2023    8:28 AM 12/03/2022    9:07 AM 08/02/2022    2:12 PM 08/01/2022    2:56 PM  Fall Risk   Falls in the past year? 0 0 0 0 0  Number falls in past yr: 0 0 0 0 0  Injury with Fall? 0 0 0 0 0  Risk for fall due to : No Fall Risks No Fall Risks No Fall Risks No Fall Risks   Follow up Falls prevention discussed;Falls evaluation completed Falls evaluation completed Falls evaluation completed Falls prevention discussed     MEDICARE RISK AT HOME: Medicare Risk at Home Any stairs in or around the home?: No Home free of loose throw rugs in walkways, pet beds, electrical cords, etc?: Yes Adequate lighting in your home to reduce risk of falls?: Yes Life alert?: No Use of a cane, walker or w/c?: Yes Grab bars in the bathroom?: Yes Shower chair or bench in shower?: Yes Elevated toilet seat or a handicapped toilet?: Yes  TIMED UP AND GO:  Was the test performed?  Yes  Length of time to ambulate 10 feet: 45 sec Gait slow and steady without use of assistive device    Cognitive Function:        07/24/2023    7:57 AM 08/02/2022    2:12 PM  6CIT Screen  What Year? 0 points 0 points  What month? 0 points 0 points  What time? 0 points 0 points  Count back from 20 4 points 0 points  Months in reverse 4 points 0 points  Repeat phrase 2 points 0 points  Total Score 10 points 0 points    Immunizations Immunization History  Administered Date(s) Administered   Fluad Quad(high Dose 65+) 04/06/2020, 04/04/2021, 04/27/2023   Influenza Split 04/09/2011, 04/13/2012   Influenza Whole 04/04/2008, 04/19/2009, 04/12/2010   Influenza, High Dose Seasonal PF 05/13/2013, 03/14/2016, 03/28/2017, 03/16/2018   Influenza,inj,Quad PF,6+ Mos 04/06/2014   Influenza-Unspecified 05/08/2019, 04/05/2022   Moderna Sars-Covid-2 Vaccination 09/03/2019, 11/10/2019, 04/18/2020   Pneumococcal Conjugate-13 09/12/2014   Pneumococcal Polysaccharide-23 01/02/2010   Td 01/02/2010   Tdap 11/27/2020    Zoster Recombinant(Shingrix) 02/05/2022, 04/07/2022   Zoster, Live 01/07/2012    TDAP status: Up to date  Flu Vaccine status: Up to date  Pneumococcal vaccine status: Up to date  Covid-19 vaccine status: Completed vaccines  Qualifies for Shingles Vaccine? Yes   Zostavax completed Yes   Shingrix Completed?: Yes  Screening Tests Health Maintenance  Topic Date Due   HEMOGLOBIN A1C  11/25/2023   OPHTHALMOLOGY EXAM  03/23/2024   Diabetic kidney evaluation - eGFR measurement  05/27/2024   Diabetic kidney evaluation - Urine ACR  05/27/2024   FOOT EXAM  05/27/2024   Medicare Annual Wellness (AWV)  07/23/2024   Colonoscopy  03/27/2026   DTaP/Tdap/Td (3 - Td or Tdap) 11/28/2030   Pneumonia Vaccine 38+ Years old  Completed   INFLUENZA VACCINE  Completed   Zoster Vaccines- Shingrix  Completed   HPV VACCINES  Aged Out   COVID-19 Vaccine  Discontinued   Hepatitis C Screening  Discontinued    Health Maintenance  There are no preventive care reminders to display for this patient.   Colorectal cancer screening: Type of screening: Colonoscopy. Completed 03/27/21. Repeat every 5 years  Lung Cancer Screening: (Low Dose CT Chest recommended if Age 27-80 years, 20 pack-year currently smoking OR have quit w/in 15years.) does not qualify.   Lung Cancer Screening Referral: N/A  Additional Screening:  Hepatitis C Screening: does qualify; Completed 11/27/2020  Vision Screening: Recommended annual ophthalmology exams for early detection of glaucoma and other disorders of the eye. Is the patient up to date with their annual eye exam?  Yes  Who is the provider or what is the name of the office in which the patient attends annual eye exams? Dr. Burgess Estelle If pt is not established with a provider, would they like to be referred to a provider to establish care? No .   Dental Screening: Recommended annual dental exams for proper oral hygiene  Diabetic Foot Exam: Diabetic Foot Exam: Completed  05/28/2023  Community Resource Referral / Chronic Care Management: CRR required this visit?  Yes   CCM required this visit?  No     Plan:     I have personally reviewed and noted the following in the patient's chart:   Medical and social history Use of alcohol, tobacco or illicit drugs  Current medications and supplements including opioid prescriptions. Patient is not currently taking opioid prescriptions. Functional ability and status Nutritional status Physical activity Advanced directives List of other physicians Hospitalizations, surgeries, and ER visits in previous 12 months Vitals Screenings to include cognitive, depression, and falls Referrals and appointments  In addition, I have reviewed and discussed with patient certain preventive protocols, quality metrics, and best practice recommendations. A written personalized care plan for preventive services as well as general preventive health recommendations were provided to patient.     Maricela Schreur L Alfredo Spong, CMA   07/24/2023   After Visit Summary: (In Person-Printed) AVS printed and given to the patient  Nurse Notes: Patient is requesting refills on Tamsulosin 0.4 mg daily and Simvastatin 20 mg daily.  He is up to date on all other health maintenance.

## 2023-07-24 NOTE — Patient Instructions (Addendum)
[name] , Thank you for taking time to come for your Medicare Wellness Visit. I appreciate your ongoing commitment to your health goals. Please review the following plan we discussed and let me know if I can assist you in the future.   Referrals/Orders/Follow-Ups/Clinician Recommendations: It was nice to meet you today.  Keep up the good work.  This is a list of the screening recommended for you and due dates:  Health Maintenance  Topic Date Due   Hemoglobin A1C  11/25/2023   Eye exam for diabetics  03/23/2024   Yearly kidney function blood test for diabetes  05/27/2024   Yearly kidney health urinalysis for diabetes  05/27/2024   Complete foot exam   05/27/2024   Medicare Annual Wellness Visit  07/23/2024   Colon Cancer Screening  03/27/2026   DTaP/Tdap/Td vaccine (3 - Td or Tdap) 11/28/2030   Pneumonia Vaccine  Completed   Flu Shot  Completed   Zoster (Shingles) Vaccine  Completed   HPV Vaccine  Aged Out   COVID-19 Vaccine  Discontinued   Hepatitis C Screening  Discontinued    Advanced directives: (Copy Requested) Please bring a copy of your health care power of attorney and living will to the office to be added to your chart at your convenience.  Next Medicare Annual Wellness Visit scheduled for next year: Yes

## 2023-08-06 ENCOUNTER — Encounter: Payer: Self-pay | Admitting: Family

## 2023-08-06 ENCOUNTER — Ambulatory Visit: Payer: Medicare Other | Admitting: Family

## 2023-08-06 DIAGNOSIS — M1732 Unilateral post-traumatic osteoarthritis, left knee: Secondary | ICD-10-CM

## 2023-08-06 DIAGNOSIS — M1712 Unilateral primary osteoarthritis, left knee: Secondary | ICD-10-CM

## 2023-08-06 MED ORDER — LIDOCAINE HCL 1 % IJ SOLN
5.0000 mL | INTRAMUSCULAR | Status: AC | PRN
  Administered 2023-08-06: 5 mL

## 2023-08-06 MED ORDER — METHYLPREDNISOLONE ACETATE 40 MG/ML IJ SUSP
40.0000 mg | INTRAMUSCULAR | Status: AC | PRN
  Administered 2023-08-06: 40 mg via INTRA_ARTICULAR

## 2023-08-06 NOTE — Progress Notes (Signed)
Office Visit Note   Patient: Alex Massey           Date of Birth: 05/03/1941           MRN: [id number] Visit Date: 08/06/2023              Requested by: Corwin Levins, MD [address] [place],  [place] [address] PCP: Corwin Levins, MD  Chief Complaint  Patient presents with   Left Knee - Pain      HPI: The patient is an 83 year old gentleman who is seen in follow-up for waxing and waning issues with his left knee.  He had a twisting injury of the knee last year and has had increased pain and intermittent swelling since he has had some intermittent giving way of the knee and does use a cane now  Had return of his pain and swelling last week increased pain with weightbearing  Assessment & Plan: Visit Diagnoses: No diagnosis found.  Plan: Depo-Medrol injection left knee.  Patient tolerated well.  Concern for possible meniscal injury at time of injury last year.  Follow-Up Instructions: Return in about 3 months (around 11/04/2023), or if symptoms worsen or fail to improve.   Left Knee Exam   Muscle Strength  The patient has normal left knee strength.  Tenderness  The patient is experiencing tenderness in the medial joint line.  Range of Motion  The patient has normal left knee ROM.  Tests  Varus: negative Valgus: negative  Other  Swelling: mild Effusion: no effusion present      Patient is alert, oriented, no adenopathy, well-dressed, normal affect, normal respiratory effort.   Imaging: No results found. No images are attached to the encounter.  Labs: Lab Results  Component Value Date   HGBA1C 6.2 05/28/2023   HGBA1C 6.5 12/03/2022   HGBA1C 6.7 (H) 08/01/2022   ESRSEDRATE 15 05/30/2020   LABURIC 4.9 09/26/2017     Lab Results  Component Value Date   ALBUMIN 4.6 05/28/2023   ALBUMIN 4.4 12/03/2022   ALBUMIN 4.6 08/01/2022    No results found for: "MG" Lab Results  Component Value Date   VD25OH 32.57 05/28/2023   VD25OH 34.83  08/01/2022   VD25OH 42.32 12/04/2021    No results found for: "PREALBUMIN"    Latest Ref Rng & Units 05/28/2023    9:36 AM 08/01/2022    3:13 PM 12/04/2021   11:57 AM  CBC EXTENDED  WBC 4.0 - 10.5 K/uL 3.9  5.1  4.1   RBC 4.22 - 5.81 Mil/uL 4.75  4.71  4.75   Hemoglobin 13.0 - 17.0 g/dL 44.0  10.2  72.5   HCT 39.0 - 52.0 % 38.8  37.9  38.3   Platelets 150.0 - 400.0 K/uL 120.0  132.0  127.0   NEUT# 1.4 - 7.7 K/uL 1.9  2.9  2.5   Lymph# 0.7 - 4.0 K/uL 1.2  1.4  1.0      There is no height or weight on file to calculate BMI.  Orders:  No orders of the defined types were placed in this encounter.  No orders of the defined types were placed in this encounter.    Procedures: Large Joint Inj: L knee on 08/06/2023 9:39 AM Indications: pain Details: 18 G 1.5 in needle, anteromedial approach Medications: 5 mL lidocaine 1 %; 40 mg methylPREDNISolone acetate 40 MG/ML Consent was given by the patient.      Clinical Data: No additional findings.  ROS:  All other systems negative, except as noted in the HPI. Review of Systems  Objective: Vital Signs: There were no vitals taken for this visit.  Specialty Comments:  EXAM: MRI LUMBAR SPINE WITHOUT CONTRAST   TECHNIQUE: Multiplanar, multisequence MR imaging of the lumbar spine was performed. No intravenous contrast was administered.   COMPARISON:  07/17/2014   FINDINGS: Segmentation:  Standard lumbar numbering   Alignment:  Physiologic   Vertebrae: Horizontal marrow edema with hypointense fracture line at L3. Height loss is minimal. No evidence of underlying lesion. Prominent fatty marrow at the level of the lower lumbar spine and sacrum, chronic and presumably from prostate cancer treatment.   Conus medullaris and cauda equina: Conus extends to the L1 level. Conus and cauda equina appear normal.   Paraspinal and other soft tissues: Scarring at the level of L4 and L5.   Disc levels:   T12- L1: Unremarkable.    L1-L2: Unremarkable.   L2-L3: Mild disc narrowing and bulging.   L3-L4: Disc narrowing and bulging borderline facet spurring.   L4-L5: Disc narrowing and right eccentric bulging. Prior right-sided laminotomy. The canal and foramina are patent   L5-S1:Greatest level of disc narrowing with bulging and ridging. Negative facets. Noncompressive left foraminal narrowing. Patent spinal canal.   IMPRESSION: 1. L3 compression fracture with slight height loss. 2. Noncompressive disc degeneration as described. Prior right-sided decompression at L4-5.     Electronically Signed   By: Marnee Spring M.D.   On: 02/27/2020 09:12  PMFS History: Patient Active Problem List   Diagnosis Date Noted   Pain and swelling of left knee 08/01/2022   COVID-19 virus infection 05/16/2021   Allergic rhinitis 05/16/2021   Vitamin D deficiency 12/03/2020   Congestion of nasal sinus 08/24/2020   Dysphagia 06/04/2020   Night sweats 05/30/2020   Hematochezia 05/30/2020   Right foot drop 02/02/2020   Paresthesia of skin 02/02/2020   Status post carpal tunnel release 02/04/2019   Left carpal tunnel syndrome 11/29/2018   Malignant neoplasm of prostate (HCC) 09/03/2018   Vitamin B12 deficiency 02/13/2018   Lumbar radiculopathy 07/09/2016   Radiculopathy due to lumbar intervertebral disc disorder 07/09/2016   Hemorrhoid 08/07/2015   BPH without obstruction/lower urinary tract symptoms 02/01/2015   Essential hypertension 09/12/2014   Encounter for well adult exam with abnormal findings 01/14/2011   ERECTILE DYSFUNCTION 04/04/2008   Hyperlipidemia 06/26/2007   Diabetes (HCC) 06/25/2007   NEPHROLITHIASIS, HX OF 06/25/2007   Past Medical History:  Diagnosis Date   Acquired right foot drop    post lumbar surgery,  wears brace   Allergy    B12 deficiency    Chronic constipation    diet   ED (erectile dysfunction)    History of kidney stones    Hyperlipidemia    Hyperplasia of prostate with lower  urinary tract symptoms (LUTS)    Hypertension    Insomnia    Neuropathy, peripheral    right foot   Presence of surgical incision 01/21/2019   s/p  left carpal tunnel release --  pt has dressing on,  denies pain or numbness   Prostate cancer [facility]) urologist-- dr eskridge/  oncologist-  dr Kathrynn Running   dx 08-04-2018,  Stage T1c, Gleason 4+4   Renal cyst, right    Type 2 diabetes mellitus (HCC)    followed by pcp   Wears partial dentures    upper and lower    Family History  Problem Relation Age of Onset   Healthy Mother  Diabetes Father    Prostate cancer Brother    Colon cancer Brother 64   Prostate cancer Cousin        maternal   Prostate cancer Nephew    Rectal cancer Neg Hx    Stomach cancer Neg Hx     Past Surgical History:  Procedure Laterality Date   CARPAL TUNNEL RELEASE Bilateral left 01-21-2019;  right 1980s   CATARACT EXTRACTION Left 03/2020   CYSTOSCOPY N/A 01/26/2019   Procedure: CYSTOSCOPY FLEXIBLE;  Surgeon: Jerilee Field, MD;  Location: [facility];  Service: Urology;  Laterality: N/A;  no seeds found in bladder   LUMBAR DISC SURGERY  1984  L4-5;  1998 L4--S1   LUMBAR EPIDURAL INJECTION  2009, 2006   dr Alvester Morin   LUMBAR MICRODISCECTOMY  05-13-2002  dr Criss Alvine  @[facility]    L4-5   RADIOACTIVE SEED IMPLANT N/A 01/26/2019   Procedure: RADIOACTIVE SEED IMPLANT/BRACHYTHERAPY IMPLANT;  Surgeon: Jerilee Field, MD;  Location: [facility];  Service: Urology;  Laterality: N/A;    54 seeds implanted   SPACE OAR INSTILLATION N/A 01/26/2019   Procedure: SPACE OAR INSTILLATION;  Surgeon: Jerilee Field, MD;  Location: [facility];  Service: Urology;  Laterality: N/A;   Social History   Occupational History    Comment: retired  Tobacco Use   Smoking status: Former    Current packs/day: 0.00    Types: Cigarettes    Start date: 01/25/1983    Quit date: 01/25/2003    Years since quitting: 20.5   Smokeless tobacco: Never   Vaping Use   Vaping status: Never Used  Substance and Sexual Activity   Alcohol use: Yes    Alcohol/week: 3.0 standard drinks of alcohol    Types: 3 Glasses of wine per week   Drug use: Never   Sexual activity: Not on file

## 2023-08-11 ENCOUNTER — Telehealth: Payer: Self-pay | Admitting: Internal Medicine

## 2023-08-11 NOTE — Telephone Encounter (Unsigned)
Copied from CRM [id number]. Topic: Clinical - Prescription Issue >> Aug 11, 2023 11:17 AM Corin V wrote: Reason for CRM: Patient called in and stated the [facility] pharmacy will not refill his JANUVIA 100 MG tablet prescription until he has a set of forms filled out. He stated he only has 4 pills left. Agent attempted to verify if the forms were for insurance, a refill request, patient assistance, or other and he was unable to confirm type of forms needed to fill out. Please let patient know if we can provide samples or what options he has ot get this medication while he waits for the forms to be mailed to him to fill out.

## 2023-08-13 NOTE — Telephone Encounter (Signed)
 Copied from CRM [id number]. Topic: Clinical - Prescription Issue >> Aug 13, 2023  9:22 AM Alex Massey wrote: Reason for CRM: patient Is waiting on paperwork to be completed before he is able to receive JANUVIA  100 MG tablet medication.He said that he has 2 tablets left,and would like to know is there any way Dr Norleen could send him in a few pills until he's able to get the other ones?  [facility] [phone] - [place], [place] - [address] AT [address]  Phone: [phone] Fax: [phone]

## 2023-08-19 ENCOUNTER — Other Ambulatory Visit: Payer: Self-pay | Admitting: Internal Medicine

## 2023-08-19 ENCOUNTER — Other Ambulatory Visit: Payer: Self-pay

## 2023-08-19 DIAGNOSIS — E118 Type 2 diabetes mellitus with unspecified complications: Secondary | ICD-10-CM

## 2023-08-19 MED ORDER — JANUVIA 100 MG PO TABS
100.0000 mg | ORAL_TABLET | Freq: Every day | ORAL | 0 refills | Status: DC
Start: 2023-08-19 — End: 2023-09-16

## 2023-08-19 NOTE — Telephone Encounter (Signed)
Refill sent.

## 2023-09-16 ENCOUNTER — Other Ambulatory Visit: Payer: Self-pay | Admitting: Internal Medicine

## 2023-09-16 DIAGNOSIS — E118 Type 2 diabetes mellitus with unspecified complications: Secondary | ICD-10-CM

## 2023-09-16 MED ORDER — JANUVIA 100 MG PO TABS: 100.0000 mg | ORAL_TABLET | Freq: Every day | ORAL | 1 refills | Status: AC

## 2023-09-16 NOTE — Telephone Encounter (Signed)
 Last Fill: 08/19/23 30 tabs/0 RF  Last OV: 07/24/23 AWV Next OV: 07/26/24 AWV  Routing to provider for review/authorization.

## 2023-09-16 NOTE — Telephone Encounter (Signed)
 Copied from CRM [id number]. Topic: Clinical - Medication Refill >> Sep 16, 2023 11:17 AM Orinda Kenner C wrote: Most Recent Primary Care Visit:  Provider: Wyvonne Lenz  Department: [facility]  Visit Type: MEDICARE AWV, SEQUENTIAL  Date: 07/24/2023  Medication: JANUVIA 100 MG tablet  Has the patient contacted their pharmacy? No (Agent: If no, request that the patient contact the pharmacy for the refill. If patient does not wish to contact the pharmacy document the reason why and proceed with request.) (Agent: If yes, when and what did the pharmacy advise?)  Is this the correct pharmacy for this prescription? Yes If no, delete pharmacy and type the correct one.  This is the patient's preferred pharmacy:  [facility] [phone] - [place], [place] - [address] [facility] [address] [place] [address] Phone: [phone] Fax: [phone]  Has the prescription been filled recently? No  Is the patient out of the medication? No, has 3 pills left and asking for refill to be sent today.   Has the patient been seen for an appointment in the last year OR does the patient have an upcoming appointment? Yes  Can we respond through MyChart? No, please call [phone]  Agent: Please be advised that Rx refills may take up to 3 business days. We ask that you follow-up with your pharmacy.

## 2023-10-13 DIAGNOSIS — Z8546 Personal history of malignant neoplasm of prostate: Secondary | ICD-10-CM | POA: Diagnosis not present

## 2023-10-20 ENCOUNTER — Other Ambulatory Visit: Payer: Self-pay | Admitting: Internal Medicine

## 2023-10-20 ENCOUNTER — Other Ambulatory Visit: Payer: Self-pay

## 2023-10-20 DIAGNOSIS — Z8546 Personal history of malignant neoplasm of prostate: Secondary | ICD-10-CM | POA: Diagnosis not present

## 2023-10-20 DIAGNOSIS — N400 Enlarged prostate without lower urinary tract symptoms: Secondary | ICD-10-CM | POA: Diagnosis not present

## 2023-11-11 DIAGNOSIS — R9721 Rising PSA following treatment for malignant neoplasm of prostate: Secondary | ICD-10-CM | POA: Diagnosis not present

## 2023-11-12 DIAGNOSIS — I10 Essential (primary) hypertension: Secondary | ICD-10-CM | POA: Diagnosis not present

## 2023-11-18 ENCOUNTER — Other Ambulatory Visit: Payer: Self-pay | Admitting: Internal Medicine

## 2023-11-18 ENCOUNTER — Other Ambulatory Visit: Payer: Self-pay

## 2023-11-25 ENCOUNTER — Other Ambulatory Visit: Payer: Self-pay

## 2023-11-25 ENCOUNTER — Other Ambulatory Visit: Payer: Self-pay | Admitting: Internal Medicine

## 2023-11-26 ENCOUNTER — Encounter: Payer: Self-pay | Admitting: Family

## 2023-11-26 ENCOUNTER — Ambulatory Visit: Admitting: Family

## 2023-11-26 DIAGNOSIS — M1732 Unilateral post-traumatic osteoarthritis, left knee: Secondary | ICD-10-CM

## 2023-11-26 DIAGNOSIS — M25462 Effusion, left knee: Secondary | ICD-10-CM

## 2023-11-26 MED ORDER — METHYLPREDNISOLONE ACETATE 40 MG/ML IJ SUSP
40.0000 mg | INTRAMUSCULAR | Status: AC | PRN
  Administered 2023-11-26: 40 mg via INTRA_ARTICULAR

## 2023-11-26 MED ORDER — LIDOCAINE HCL 1 % IJ SOLN
5.0000 mL | INTRAMUSCULAR | Status: AC | PRN
  Administered 2023-11-26: 5 mL

## 2023-11-26 NOTE — Progress Notes (Signed)
 Office Visit Note   Patient: Alex Massey           Date of Birth: [birth year]           MRN: [id number] Visit Date: 11/26/2023              Requested by: Roslyn Coombe, MD [address] [place],  [place] [address] PCP: Roslyn Coombe, MD  Chief Complaint  Patient presents with   Left Knee - Pain      HPI: The patient is an 83 year old gentleman who is seen in follow-up for gradual worsening of his left knee pain he reports increased swelling and fullness difficulty with flexion increased pain  Does have a history of effusions  Last Depo-Medrol  injection was January 29 of this year  Assessment & Plan: Visit Diagnoses: No diagnosis found.  Plan: Aspiration injection today.  Patient tolerated well.  Will follow-up as needed.  Follow-Up Instructions: Return if symptoms worsen or fail to improve.   Left Knee Exam   Muscle Strength  The patient has normal left knee strength.  Tenderness  The patient is experiencing tenderness in the medial joint line.  Range of Motion  The patient has normal left knee ROM.  Tests  Varus: negative Valgus: negative  Other  Erythema: absent Swelling: moderate Effusion: effusion present      Patient is alert, oriented, no adenopathy, well-dressed, normal affect, normal respiratory effort.   Imaging: No results found. No images are attached to the encounter.  Labs: Lab Results  Component Value Date   HGBA1C 6.2 05/28/2023   HGBA1C 6.5 12/03/2022   HGBA1C 6.7 (H) 08/01/2022   ESRSEDRATE 15 05/30/2020   LABURIC 4.9 09/26/2017     Lab Results  Component Value Date   ALBUMIN 4.6 05/28/2023   ALBUMIN 4.4 12/03/2022   ALBUMIN 4.6 08/01/2022    No results found for: "MG" Lab Results  Component Value Date   VD25OH 32.57 05/28/2023   VD25OH 34.83 08/01/2022   VD25OH 42.32 12/04/2021    No results found for: "PREALBUMIN"    Latest Ref Rng & Units 05/28/2023    9:36 AM 08/01/2022    3:13 PM 12/04/2021    11:57 AM  CBC EXTENDED  WBC 4.0 - 10.5 K/uL 3.9  5.1  4.1   RBC 4.22 - 5.81 Mil/uL 4.75  4.71  4.75   Hemoglobin 13.0 - 17.0 g/dL 19.1  47.8  29.5   HCT 39.0 - 52.0 % 38.8  37.9  38.3   Platelets 150.0 - 400.0 K/uL 120.0  132.0  127.0   NEUT# 1.4 - 7.7 K/uL 1.9  2.9  2.5   Lymph# 0.7 - 4.0 K/uL 1.2  1.4  1.0      There is no height or weight on file to calculate BMI.  Orders:  No orders of the defined types were placed in this encounter.  No orders of the defined types were placed in this encounter.    Procedures: Large Joint Inj: L knee on 11/26/2023 11:04 AM Indications: pain Details: 18 G 1.5 in needle, superolateral approach Medications: 5 mL lidocaine  1 %; 40 mg methylPREDNISolone  acetate 40 MG/ML Aspirate: 70 mL serous and yellow Consent was given by the patient.      Clinical Data: No additional findings.  ROS:  All other systems negative, except as noted in the HPI. Review of Systems  Objective: Vital Signs: There were no vitals taken for this visit.  Specialty Comments:  EXAM:  MRI LUMBAR SPINE WITHOUT CONTRAST   TECHNIQUE: Multiplanar, multisequence MR imaging of the lumbar spine was performed. No intravenous contrast was administered.   COMPARISON:  07/17/2014   FINDINGS: Segmentation:  Standard lumbar numbering   Alignment:  Physiologic   Vertebrae: Horizontal marrow edema with hypointense fracture line at L3. Height loss is minimal. No evidence of underlying lesion. Prominent fatty marrow at the level of the lower lumbar spine and sacrum, chronic and presumably from prostate cancer treatment.   Conus medullaris and cauda equina: Conus extends to the L1 level. Conus and cauda equina appear normal.   Paraspinal and other soft tissues: Scarring at the level of L4 and L5.   Disc levels:   T12- L1: Unremarkable.   L1-L2: Unremarkable.   L2-L3: Mild disc narrowing and bulging.   L3-L4: Disc narrowing and bulging borderline facet  spurring.   L4-L5: Disc narrowing and right eccentric bulging. Prior right-sided laminotomy. The canal and foramina are patent   L5-S1:Greatest level of disc narrowing with bulging and ridging. Negative facets. Noncompressive left foraminal narrowing. Patent spinal canal.   IMPRESSION: 1. L3 compression fracture with slight height loss. 2. Noncompressive disc degeneration as described. Prior right-sided decompression at L4-5.     Electronically Signed   By: Aleta Hutch M.D.   On: 02/27/2020 09:12  PMFS History: Patient Active Problem List   Diagnosis Date Noted   Pain and swelling of left knee 08/01/2022   COVID-19 virus infection 05/16/2021   Allergic rhinitis 05/16/2021   Vitamin D  deficiency 12/03/2020   Congestion of nasal sinus 08/24/2020   Dysphagia 06/04/2020   Night sweats 05/30/2020   Hematochezia 05/30/2020   Right foot drop 02/02/2020   Paresthesia of skin 02/02/2020   Status post carpal tunnel release 02/04/2019   Left carpal tunnel syndrome 11/29/2018   Malignant neoplasm of prostate (HCC) 09/03/2018   Vitamin B12 deficiency 02/13/2018   Lumbar radiculopathy 07/09/2016   Radiculopathy due to lumbar intervertebral disc disorder 07/09/2016   Hemorrhoid 08/07/2015   BPH without obstruction/lower urinary tract symptoms 02/01/2015   Essential hypertension 09/12/2014   Encounter for well adult exam with abnormal findings 01/14/2011   ERECTILE DYSFUNCTION 04/04/2008   Hyperlipidemia 06/26/2007   Diabetes (HCC) 06/25/2007   NEPHROLITHIASIS, HX OF 06/25/2007   Past Medical History:  Diagnosis Date   Acquired right foot drop    post lumbar surgery,  wears brace   Allergy    B12 deficiency    Chronic constipation    diet   ED (erectile dysfunction)    History of kidney stones    Hyperlipidemia    Hyperplasia of prostate with lower urinary tract symptoms (LUTS)    Hypertension    Insomnia    Neuropathy, peripheral    right foot   Presence of  surgical incision 01/21/2019   s/p  left carpal tunnel release --  pt has dressing on,  denies pain or numbness   Prostate cancer [facility]) urologist-- dr eskridge/  oncologist-  dr Lorri Rota   dx 08-04-2018,  Stage T1c, Gleason 4+4   Renal cyst, right    Type 2 diabetes mellitus (HCC)    followed by pcp   Wears partial dentures    upper and lower    Family History  Problem Relation Age of Onset   Healthy Mother    Diabetes Father    Prostate cancer Brother    Colon cancer Brother 35   Prostate cancer Cousin        maternal  Prostate cancer Nephew    Rectal cancer Neg Hx    Stomach cancer Neg Hx     Past Surgical History:  Procedure Laterality Date   CARPAL TUNNEL RELEASE Bilateral left 01-21-2019;  right 1980s   CATARACT EXTRACTION Left 03/2020   CYSTOSCOPY N/A 01/26/2019   Procedure: CYSTOSCOPY FLEXIBLE;  Surgeon: Christina Coyer, MD;  Location: [facility];  Service: Urology;  Laterality: N/A;  no seeds found in bladder   LUMBAR DISC SURGERY  1984  L4-5;  1998 L4--S1   LUMBAR EPIDURAL INJECTION  2009, 2006   dr Daisey Dryer   LUMBAR MICRODISCECTOMY  05-13-2002  dr Jerrilyn Moras  @[facility]    L4-5   RADIOACTIVE SEED IMPLANT N/A 01/26/2019   Procedure: RADIOACTIVE SEED IMPLANT/BRACHYTHERAPY IMPLANT;  Surgeon: Christina Coyer, MD;  Location[facility];  Service: Urology;  Laterality: N/A;    54 seeds implanted   SPACE OAR INSTILLATION N/A 01/26/2019   Procedure: SPACE OAR INSTILLATION;  Surgeon: Christina Coyer, MD;  Location: [facility];  Service: Urology;  Laterality: N/A;   Social History   Occupational History    Comment: retired  Tobacco Use   Smoking status: Former    Current packs/day: 0.00    Types: Cigarettes    Start date: 01/25/1983    Quit date: 01/25/2003    Years since quitting: 20.8   Smokeless tobacco: Never  Vaping Use   Vaping status: Never Used  Substance and Sexual Activity   Alcohol use: Yes    Alcohol/week: 3.0  standard drinks of alcohol    Types: 3 Glasses of wine per week   Drug use: Never   Sexual activity: Not on file

## 2023-12-27 ENCOUNTER — Other Ambulatory Visit: Payer: Self-pay | Admitting: Internal Medicine

## 2023-12-29 ENCOUNTER — Other Ambulatory Visit: Payer: Self-pay

## 2023-12-31 ENCOUNTER — Telehealth: Payer: Self-pay | Admitting: Physical Medicine and Rehabilitation

## 2024-01-07 ENCOUNTER — Encounter: Payer: Self-pay | Admitting: Family

## 2024-01-07 ENCOUNTER — Ambulatory Visit: Admitting: Family

## 2024-01-07 DIAGNOSIS — M5416 Radiculopathy, lumbar region: Secondary | ICD-10-CM

## 2024-01-07 MED ORDER — PREDNISONE 50 MG PO TABS: ORAL_TABLET | ORAL | 0 refills | Status: AC

## 2024-01-07 NOTE — Progress Notes (Signed)
 Office Visit Note   Patient: Alex Massey           Date of Birth: August 14, 1940           MRN: [id number] Visit Date: 01/07/2024              Requested by: Norleen Lynwood ORN, MD [address] [place],  [place] [address] PCP: Norleen Lynwood ORN, MD  Chief Complaint  Patient presents with   Right Foot - Pain      HPI: The patient is an 83 year old gentleman who presents in follow-up for return of tightness numbness and pain to his right foot this is consistent with prior episodes of lumbar radiculopathy.  He has previously had injections with Dr. [name] which have relieved his symptoms.  He today is requesting repeat injection.  He has no red flag symptoms  Of note he does have a foot drop on the right has an AFO and this works well for him he has not had any new injuries or new symptoms  His last ESI was over a year ago and worked quite well  Assessment & Plan: Visit Diagnoses:  1. Lumbar radiculopathy     Plan: Provided prescription for prednisone  and have sent referral to Dr. [name] for repeat injection the patient is in agreement the plan.  He reports his current AFOs are working well does not need new ones  Follow-Up Instructions: No follow-ups on file.   Right Ankle Exam   Tenderness  The patient is experiencing no tenderness. Swelling: moderate  Other  Erythema: absent Sensation: decreased Pulse: present    Left Ankle Exam  Swelling: moderate   Back Exam   Other  Gait: drop-foot (Shuffling)       Patient is alert, oriented, no adenopathy, well-dressed, normal affect, normal respiratory effort.  Is slow to rise from a seated to a standing position. Weakness with dorsiflexion and plantarflexion right foot    Imaging: No results found. No images are attached to the encounter.  Labs: Lab Results  Component Value Date   HGBA1C 6.2 05/28/2023   HGBA1C 6.5 12/03/2022   HGBA1C 6.7 (H) 08/01/2022   ESRSEDRATE 15 05/30/2020   LABURIC 4.9 09/26/2017      Lab Results  Component Value Date   ALBUMIN 4.6 05/28/2023   ALBUMIN 4.4 12/03/2022   ALBUMIN 4.6 08/01/2022    No results found for: MG Lab Results  Component Value Date   VD25OH 32.57 05/28/2023   VD25OH 34.83 08/01/2022   VD25OH 42.32 12/04/2021    No results found for: PREALBUMIN    Latest Ref Rng & Units 05/28/2023    9:36 AM 08/01/2022    3:13 PM 12/04/2021   11:57 AM  CBC EXTENDED  WBC 4.0 - 10.5 K/uL 3.9  5.1  4.1   RBC 4.22 - 5.81 Mil/uL 4.75  4.71  4.75   Hemoglobin 13.0 - 17.0 g/dL 87.4  87.6  87.6   HCT 39.0 - 52.0 % 38.8  37.9  38.3   Platelets 150.0 - 400.0 K/uL 120.0  132.0  127.0   NEUT# 1.4 - 7.7 K/uL 1.9  2.9  2.5   Lymph# 0.7 - 4.0 K/uL 1.2  1.4  1.0      There is no height or weight on file to calculate BMI.  Orders:  Orders Placed This Encounter  Procedures   Ambulatory referral to Physical Medicine Rehab   No orders of the defined types were placed in this encounter.  Procedures: No procedures performed  Clinical Data: No additional findings.  ROS:  All other systems negative, except as noted in the HPI. Review of Systems  Objective: Vital Signs: There were no vitals taken for this visit.  Specialty Comments:  EXAM: MRI LUMBAR SPINE WITHOUT CONTRAST   TECHNIQUE: Multiplanar, multisequence MR imaging of the lumbar spine was performed. No intravenous contrast was administered.   COMPARISON:  07/17/2014   FINDINGS: Segmentation:  Standard lumbar numbering   Alignment:  Physiologic   Vertebrae: Horizontal marrow edema with hypointense fracture line at L3. Height loss is minimal. No evidence of underlying lesion. Prominent fatty marrow at the level of the lower lumbar spine and sacrum, chronic and presumably from prostate cancer treatment.   Conus medullaris and cauda equina: Conus extends to the L1 level. Conus and cauda equina appear normal.   Paraspinal and other soft tissues: Scarring at the level of L4  and L5.   Disc levels:   T12- L1: Unremarkable.   L1-L2: Unremarkable.   L2-L3: Mild disc narrowing and bulging.   L3-L4: Disc narrowing and bulging borderline facet spurring.   L4-L5: Disc narrowing and right eccentric bulging. Prior right-sided laminotomy. The canal and foramina are patent   L5-S1:Greatest level of disc narrowing with bulging and ridging. Negative facets. Noncompressive left foraminal narrowing. Patent spinal canal.   IMPRESSION: 1. L3 compression fracture with slight height loss. 2. Noncompressive disc degeneration as described. Prior right-sided decompression at L4-5.     Electronically Signed   By: Cassondra Roulette M.D.   On: 02/27/2020 09:12  PMFS History: Patient Active Problem List   Diagnosis Date Noted   Pain and swelling of left knee 08/01/2022   COVID-19 virus infection 05/16/2021   Allergic rhinitis 05/16/2021   Vitamin D  deficiency 12/03/2020   Congestion of nasal sinus 08/24/2020   Dysphagia 06/04/2020   Night sweats 05/30/2020   Hematochezia 05/30/2020   Right foot drop 02/02/2020   Paresthesia of skin 02/02/2020   Status post carpal tunnel release 02/04/2019   Left carpal tunnel syndrome 11/29/2018   Malignant neoplasm of prostate (HCC) 09/03/2018   Vitamin B12 deficiency 02/13/2018   Lumbar radiculopathy 07/09/2016   Radiculopathy due to lumbar intervertebral disc disorder 07/09/2016   Hemorrhoid 08/07/2015   BPH without obstruction/lower urinary tract symptoms 02/01/2015   Essential hypertension 09/12/2014   Encounter for well adult exam with abnormal findings 01/14/2011   ERECTILE DYSFUNCTION 04/04/2008   Hyperlipidemia 06/26/2007   Diabetes (HCC) 06/25/2007   NEPHROLITHIASIS, HX OF 06/25/2007   Past Medical History:  Diagnosis Date   Acquired right foot drop    post lumbar surgery,  wears brace   Allergy    B12 deficiency    Chronic constipation    diet   ED (erectile dysfunction)    History of kidney stones     Hyperlipidemia    Hyperplasia of prostate with lower urinary tract symptoms (LUTS)    Hypertension    Insomnia    Neuropathy, peripheral    right foot   Presence of surgical incision 01/21/2019   s/p  left carpal tunnel release --  pt has dressing on,  denies pain or numbness   Prostate cancer [facility]) urologist-- dr eskridge/  oncologist-  dr patrcia   dx 08-04-2018,  Stage T1c, Gleason 4+4   Renal cyst, right    Type 2 diabetes mellitus (HCC)    followed by pcp   Wears partial dentures    upper and lower  Family History  Problem Relation Age of Onset   Healthy Mother    Diabetes Father    Prostate cancer Brother    Colon cancer Brother 44   Prostate cancer Cousin        maternal   Prostate cancer Nephew    Rectal cancer Neg Hx    Stomach cancer Neg Hx     Past Surgical History:  Procedure Laterality Date   CARPAL TUNNEL RELEASE Bilateral left 01-21-2019;  right 1980s   CATARACT EXTRACTION Left 03/2020   CYSTOSCOPY N/A 01/26/2019   Procedure: CYSTOSCOPY FLEXIBLE;  Surgeon: Nieves Cough, MD;  Location[facility];  Service: Urology;  Laterality: N/A;  no seeds found in bladder   LUMBAR DISC SURGERY  1984  L4-5;  1998 L4--S1   LUMBAR EPIDURAL INJECTION  2009, 2006   dr eldonna   LUMBAR MICRODISCECTOMY  05-13-2002  dr chloe  @[facility]    L4-5   RADIOACTIVE SEED IMPLANT N/A 01/26/2019   Procedure: RADIOACTIVE SEED IMPLANT/BRACHYTHERAPY IMPLANT;  Surgeon: Nieves Cough, MD;  Location: [facility];  Service: Urology;  Laterality: N/A;    54 seeds implanted   SPACE OAR INSTILLATION N/A 01/26/2019   Procedure: SPACE OAR INSTILLATION;  Surgeon: Nieves Cough, MD;  Location: [facility];  Service: Urology;  Laterality: N/A;   Social History   Occupational History    Comment: retired  Tobacco Use   Smoking status: Former    Current packs/day: 0.00    Types: Cigarettes    Start date: 01/25/1983    Quit date: 01/25/2003    Years  since quitting: 20.9   Smokeless tobacco: Never  Vaping Use   Vaping status: Never Used  Substance and Sexual Activity   Alcohol use: Yes    Alcohol/week: 3.0 standard drinks of alcohol    Types: 3 Glasses of wine per week   Drug use: Never   Sexual activity: Not on file

## 2024-01-09 ENCOUNTER — Other Ambulatory Visit: Payer: Self-pay | Admitting: Internal Medicine

## 2024-01-09 DIAGNOSIS — E1165 Type 2 diabetes mellitus with hyperglycemia: Secondary | ICD-10-CM

## 2024-02-23 ENCOUNTER — Encounter: Payer: Self-pay | Admitting: Physical Medicine and Rehabilitation

## 2024-02-23 ENCOUNTER — Other Ambulatory Visit: Payer: Self-pay

## 2024-02-23 ENCOUNTER — Ambulatory Visit: Admitting: Physical Medicine and Rehabilitation

## 2024-02-23 VITALS — BP 168/81 | HR 85

## 2024-02-23 DIAGNOSIS — R2689 Other abnormalities of gait and mobility: Secondary | ICD-10-CM | POA: Diagnosis not present

## 2024-02-23 DIAGNOSIS — R202 Paresthesia of skin: Secondary | ICD-10-CM | POA: Diagnosis not present

## 2024-02-23 DIAGNOSIS — M21371 Foot drop, right foot: Secondary | ICD-10-CM

## 2024-02-23 DIAGNOSIS — M5416 Radiculopathy, lumbar region: Secondary | ICD-10-CM

## 2024-02-23 MED ORDER — METHYLPREDNISOLONE ACETATE 40 MG/ML IJ SUSP
40.0000 mg | Freq: Once | INTRAMUSCULAR | Status: AC
  Administered 2024-02-23: 40 mg

## 2024-02-23 NOTE — Patient Instructions (Signed)

## 2024-02-23 NOTE — Procedures (Signed)
 Lumbosacral Transforaminal Epidural Steroid Injection - Sub-Pedicular Approach with Fluoroscopic Guidance  Patient: Alex Massey      Date of Birth: 1941-04-13 MRN: [id number] PCP: Norleen Lynwood ORN, MD      Visit Date: 02/23/2024   Universal Protocol:    Date/Time: 02/23/2024  Consent Given By: the patient  Position: PRONE  Additional Comments: Vital signs were monitored before and after the procedure. Patient was prepped and draped in the usual sterile fashion. The correct patient, procedure, and site was verified.   Injection Procedure Details:   Procedure diagnoses: Lumbar radiculopathy [M54.16]    Meds Administered:  Meds ordered this encounter  Medications   methylPREDNISolone  acetate (DEPO-MEDROL ) injection 40 mg    Laterality: Right  Location/Site: L5 and S1  Needle:5.0 in., 22 ga.  Short bevel or Quincke spinal needle  Needle Placement: Transforaminal  Findings:    -Comments: Excellent flow of contrast along the nerve, nerve root and into the epidural space.  Procedure Details: After squaring off the end-plates to get a true AP view, the C-arm was positioned so that an oblique view of the foramen as noted above was visualized. The target area is just inferior to the nose of the scotty dog or sub pedicular. The soft tissues overlying this structure were infiltrated with 2-3 ml. of 1% Lidocaine  without Epinephrine.  The spinal needle was inserted toward the target using a trajectory view along the fluoroscope beam.  Under AP and lateral visualization, the needle was advanced so it did not puncture dura and was located close the 6 O'Clock position of the pedical in AP tracterory. Biplanar projections were used to confirm position. Aspiration was confirmed to be negative for CSF and/or blood. A 1-2 ml. volume of Isovue-250 was injected and flow of contrast was noted at each level. Radiographs were obtained for documentation purposes.   After attaining the  desired flow of contrast documented above, a 0.5 to 1.0 ml test dose of 0.25% Marcaine was injected into each respective transforaminal space.  The patient was observed for 90 seconds post injection.  After no sensory deficits were reported, and normal lower extremity motor function was noted,   the above injectate was administered so that equal amounts of the injectate were placed at each foramen (level) into the transforaminal epidural space.   Additional Comments:  The patient tolerated the procedure well Dressing: 2 x 2 sterile gauze and Band-Aid    Post-procedure details: Patient was observed during the procedure. Post-procedure instructions were reviewed.  Patient left the clinic in stable condition.

## 2024-02-23 NOTE — Progress Notes (Signed)
 Alex Massey - 83 y.o. male MRN [id number]  Date of birth: September 14, 1940  Office Visit Note: Visit Date: 02/23/2024 PCP: Norleen Lynwood ORN, MD Referred by: Norleen Lynwood ORN, MD  Subjective: Chief Complaint  Patient presents with   Lower Back - Pain   HPI: Alex Massey is a 83 y.o. male who comes in today for evaluation and management at the request of Rocky Hans, FNP for continued pain numbness tingling in the right lower extremity.  He has a long history of lumbar laminectomy surgery with right foot drop.  He still endorses a lot of paresthesia and numbness in the right foot.  He does get pain in the leg at times.  He has had some traumatic knee pain they have looked at Dr. Harden.  He denies any real left-sided radicular symptoms.  He does have some back pain.  He has had no specific trauma lately to his spine but he has had increased falls.  His diabetes is well-controlled with normal hemoglobin A1c's now.  Nonetheless we noticed when he walked in today a very interesting shuffling gait.  He does use a cane.  He is very small wide-based gait with instability.  Once he holds onto some of the shoulder he can walk faster.  Patient has not seen a neurologist and does report to me that gait changes been over the last couple of weeks.  No associated other neurologic findings or headaches.   I spent more than 30 minutes speaking face-to-face with the patient with 50% of the time in counseling and discussing coordination of care.       Review of Systems  Musculoskeletal:  Positive for back pain, falls and joint pain.  Neurological:  Positive for tingling and focal weakness.  All other systems reviewed and are negative.  Otherwise per HPI.  Assessment & Plan: Visit Diagnoses:    ICD-10-CM   1. Lumbar radiculopathy  M54.16 XR C-ARM NO REPORT    Epidural Steroid injection    methylPREDNISolone  acetate (DEPO-MEDROL ) injection 40 mg    2. Paresthesia of skin  R20.2     3. Right foot drop   M21.371     4. Shuffling gait  R26.89 Ambulatory referral to Neurology       Plan: Findings:  1.  Right leg and foot pain with known right foot drop and prior postlaminectomy syndrome.  Will go ahead today and complete a right L5-S1 transforaminal injection that we did last year that seems to have helped him to some degree he says over time.  It is really hard to get a good history of how much it helps.  The numbness in the foot is probably somewhat permanent as he does have foot drop.  I would also look at a lumbar spine MRI in the future as this has been a fairly old MRI at this point from 2021 but there is been no real new findings.  2.  In terms of his shuffling gait exam did not show any cogwheeling or masslike facies or any other tremor that I could see.  He does have an interesting shuffling gait.  I did make a referral to Dr. Asberry Tat at [facility] neurology for movement disorder evaluation.  Differential diagnosis could be normal pressure hydrocephalus.  Cognition seems to be good however.    Meds & Orders:  Meds ordered this encounter  Medications   methylPREDNISolone  acetate (DEPO-MEDROL ) injection 40 mg    Orders Placed This Encounter  Procedures  XR C-ARM NO REPORT   Ambulatory referral to Neurology   Epidural Steroid injection    Follow-up: Return if symptoms worsen or fail to improve.   Procedures: No procedures performed  Lumbosacral Transforaminal Epidural Steroid Injection - Sub-Pedicular Approach with Fluoroscopic Guidance  Patient: Alex Massey      Date of Birth: 07/04/1941 MRN: [id number] PCP: Norleen Lynwood ORN, MD      Visit Date: 02/23/2024   Universal Protocol:    Date/Time: 02/23/2024  Consent Given By: the patient  Position: PRONE  Additional Comments: Vital signs were monitored before and after the procedure. Patient was prepped and draped in the usual sterile fashion. The correct patient, procedure, and site was verified.   Injection  Procedure Details:   Procedure diagnoses: Lumbar radiculopathy [M54.16]    Meds Administered:  Meds ordered this encounter  Medications   methylPREDNISolone  acetate (DEPO-MEDROL ) injection 40 mg    Laterality: Right  Location/Site: L5 and S1  Needle:5.0 in., 22 ga.  Short bevel or Quincke spinal needle  Needle Placement: Transforaminal  Findings:    -Comments: Excellent flow of contrast along the nerve, nerve root and into the epidural space.  Procedure Details: After squaring off the end-plates to get a true AP view, the C-arm was positioned so that an oblique view of the foramen as noted above was visualized. The target area is just inferior to the nose of the scotty dog or sub pedicular. The soft tissues overlying this structure were infiltrated with 2-3 ml. of 1% Lidocaine  without Epinephrine.  The spinal needle was inserted toward the target using a trajectory view along the fluoroscope beam.  Under AP and lateral visualization, the needle was advanced so it did not puncture dura and was located close the 6 O'Clock position of the pedical in AP tracterory. Biplanar projections were used to confirm position. Aspiration was confirmed to be negative for CSF and/or blood. A 1-2 ml. volume of Isovue-250 was injected and flow of contrast was noted at each level. Radiographs were obtained for documentation purposes.   After attaining the desired flow of contrast documented above, a 0.5 to 1.0 ml test dose of 0.25% Marcaine was injected into each respective transforaminal space.  The patient was observed for 90 seconds post injection.  After no sensory deficits were reported, and normal lower extremity motor function was noted,   the above injectate was administered so that equal amounts of the injectate were placed at each foramen (level) into the transforaminal epidural space.   Additional Comments:  The patient tolerated the procedure well Dressing: 2 x 2 sterile gauze and  Band-Aid    Post-procedure details: Patient was observed during the procedure. Post-procedure instructions were reviewed.  Patient left the clinic in stable condition.    Clinical History: EXAM: MRI LUMBAR SPINE WITHOUT CONTRAST   TECHNIQUE: Multiplanar, multisequence MR imaging of the lumbar spine was performed. No intravenous contrast was administered.   COMPARISON:  07/17/2014   FINDINGS: Segmentation:  Standard lumbar numbering   Alignment:  Physiologic   Vertebrae: Horizontal marrow edema with hypointense fracture line at L3. Height loss is minimal. No evidence of underlying lesion. Prominent fatty marrow at the level of the lower lumbar spine and sacrum, chronic and presumably from prostate cancer treatment.   Conus medullaris and cauda equina: Conus extends to the L1 level. Conus and cauda equina appear normal.   Paraspinal and other soft tissues: Scarring at the level of L4 and L5.   Disc levels:  T12- L1: Unremarkable.   L1-L2: Unremarkable.   L2-L3: Mild disc narrowing and bulging.   L3-L4: Disc narrowing and bulging borderline facet spurring.   L4-L5: Disc narrowing and right eccentric bulging. Prior right-sided laminotomy. The canal and foramina are patent   L5-S1:Greatest level of disc narrowing with bulging and ridging. Negative facets. Noncompressive left foraminal narrowing. Patent spinal canal.   IMPRESSION: 1. L3 compression fracture with slight height loss. 2. Noncompressive disc degeneration as described. Prior right-sided decompression at L4-5.     Electronically Signed   By: Cassondra Roulette M.D.   On: 02/27/2020 09:12   He reports that he quit smoking about 21 years ago. His smoking use included cigarettes. He started smoking about 41 years ago. He has never used smokeless tobacco.  Recent Labs    05/28/23 0936  HGBA1C 6.2    Objective:  VS:  HT:    WT:   BMI:     BP:(!) 168/81  HR:85bpm  TEMP: ( )  RESP:  Physical  Exam Vitals and nursing note reviewed.  Constitutional:      General: He is not in acute distress.    Appearance: Normal appearance. He is not ill-appearing.  HENT:     Head: Normocephalic and atraumatic.     Right Ear: External ear normal.     Left Ear: External ear normal.     Nose: No congestion.  Eyes:     Extraocular Movements: Extraocular movements intact.  Cardiovascular:     Rate and Rhythm: Normal rate.     Pulses: Normal pulses.  Pulmonary:     Effort: Pulmonary effort is normal. No respiratory distress.  Abdominal:     General: There is no distension.     Palpations: Abdomen is soft.  Musculoskeletal:        General: No tenderness or signs of injury.     Cervical back: Neck supple.     Right lower leg: No edema.     Left lower leg: No edema.     Comments: Left lower extremity with good distal strength with hip flexion and knee flexion extension and dorsiflexion plantarflexion EHL.  Right foot drop noted.  No clonus.  He is very slow to rise from a seated position and does ambulate with a shuffling sort of wide-based gait.  Has difficulty turning.  Does use a cane.  Does walk better with hand placed on staff.  He has not had in the exam room today any tremor or retropulsion or cogwheeling.  Skin:    Findings: No erythema or rash.  Neurological:     General: No focal deficit present.     Mental Status: He is alert and oriented to person, place, and time.     Cranial Nerves: No cranial nerve deficit.     Sensory: Sensory deficit present.     Motor: Weakness present. No abnormal muscle tone.     Coordination: Coordination normal.     Gait: Gait abnormal.  Psychiatric:        Mood and Affect: Mood normal.        Behavior: Behavior normal.     Ortho Exam  Imaging: No results found.  Past Medical/Family/Surgical/Social History: Medications & Allergies reviewed per EMR, new medications updated. Patient Active Problem List   Diagnosis Date Noted   Pain and swelling  of left knee 08/01/2022   COVID-19 virus infection 05/16/2021   Allergic rhinitis 05/16/2021   Vitamin D  deficiency 12/03/2020   Congestion of nasal  sinus 08/24/2020   Dysphagia 06/04/2020   Night sweats 05/30/2020   Hematochezia 05/30/2020   Right foot drop 02/02/2020   Paresthesia of skin 02/02/2020   Status post carpal tunnel release 02/04/2019   Left carpal tunnel syndrome 11/29/2018   Malignant neoplasm of prostate (HCC) 09/03/2018   Vitamin B12 deficiency 02/13/2018   Lumbar radiculopathy 07/09/2016   Radiculopathy due to lumbar intervertebral disc disorder 07/09/2016   Hemorrhoid 08/07/2015   BPH without obstruction/lower urinary tract symptoms 02/01/2015   Essential hypertension 09/12/2014   Encounter for well adult exam with abnormal findings 01/14/2011   ERECTILE DYSFUNCTION 04/04/2008   Hyperlipidemia 06/26/2007   Diabetes (HCC) 06/25/2007   NEPHROLITHIASIS, HX OF 06/25/2007   Past Medical History:  Diagnosis Date   Acquired right foot drop    post lumbar surgery,  wears brace   Allergy    B12 deficiency    Chronic constipation    diet   ED (erectile dysfunction)    History of kidney stones    Hyperlipidemia    Hyperplasia of prostate with lower urinary tract symptoms (LUTS)    Hypertension    Insomnia    Neuropathy, peripheral    right foot   Presence of surgical incision 01/21/2019   s/p  left carpal tunnel release --  pt has dressing on,  denies pain or numbness   Prostate cancer [facility]) urologist-- dr eskridge/  oncologist-  dr patrcia   dx 08-04-2018,  Stage T1c, Gleason 4+4   Renal cyst, right    Type 2 diabetes mellitus (HCC)    followed by pcp   Wears partial dentures    upper and lower   Family History  Problem Relation Age of Onset   Healthy Mother    Diabetes Father    Prostate cancer Brother    Colon cancer Brother 66   Prostate cancer Cousin        maternal   Prostate cancer Nephew    Rectal cancer Neg Hx    Stomach cancer Neg Hx     Past Surgical History:  Procedure Laterality Date   CARPAL TUNNEL RELEASE Bilateral left 01-21-2019;  right 1980s   CATARACT EXTRACTION Left 03/2020   CYSTOSCOPY N/A 01/26/2019   Procedure: CYSTOSCOPY FLEXIBLE;  Surgeon: Nieves Cough, MD;  Location: [facility];  Service: Urology;  Laterality: N/A;  no seeds found in bladder   LUMBAR DISC SURGERY  1984  L4-5;  1998 L4--S1   LUMBAR EPIDURAL INJECTION  2009, 2006   dr eldonna   LUMBAR MICRODISCECTOMY  05-13-2002  dr chloe  @[facility]    L4-5   RADIOACTIVE SEED IMPLANT N/A 01/26/2019   Procedure: RADIOACTIVE SEED IMPLANT/BRACHYTHERAPY IMPLANT;  Surgeon: Nieves Cough, MD;  Location: [facility];  Service: Urology;  Laterality: N/A;    54 seeds implanted   SPACE OAR INSTILLATION N/A 01/26/2019   Procedure: SPACE OAR INSTILLATION;  Surgeon: Nieves Cough, MD;  Location[facility];  Service: Urology;  Laterality: N/A;   Social History   Occupational History    Comment: retired  Tobacco Use   Smoking status: Former    Current packs/day: 0.00    Types: Cigarettes    Start date: 01/25/1983    Quit date: 01/25/2003    Years since quitting: 21.0   Smokeless tobacco: Never  Vaping Use   Vaping status: Never Used  Substance and Sexual Activity   Alcohol use: Yes    Alcohol/week: 3.0 standard drinks of alcohol  Types: 3 Glasses of wine per week   Drug use: Never   Sexual activity: Not on file

## 2024-02-23 NOTE — Progress Notes (Signed)
 Pain Scale   Average Pain 3 Patient advising he has chronic lower back pain radiating to right hip. Patient advises his pain is chronic.        +Driver, -BT, -Dye Allergies.

## 2024-02-24 NOTE — Progress Notes (Unsigned)
 Assessment/Plan:   1.  Parkinsonism, suspect vascular  -significant stutter steps and start hesitation (sxs x 3 years)  - Does not meet criteria for idiopathic Parkinsons disease today.  He is not rigid and not particularly bradykinetic.  - We will do MRI of the brain.  - DaTscan will be performed.  2.  Lumbar radiculopathy, with prior lumbar laminectomy and right foot drop  - Patient follows with Dr. [name]  3.  Follow-up after the above has been completed.  Subjective:   Alex Massey was seen today in the movement disorders clinic for neurologic consultation at the request of Alex Novel, MD. This patient is accompanied in the office by his spouse who supplements the history.  The consultation is for the evaluation of shuffling gait.  Medical records made available to me are reviewed.  Patient has been seeing Dr. [name] for injections for lumbar radiculopathy and just saw him 2 days ago.  When he walked into the office, they noted that he was walking with a shuffling gait, but when he held onto someone's shoulder he could walk faster.  He reported to Dr. [name] that it had only been going on for a few weeks.  He was referred for further evaluation.  Of note, the patient does have a long history of lumbar laminectomy that resulted in right foot drop (states his surgery was over 40 years ago).  Pt states I have drop foot and there is nothing else that can be done.  Wife reports today that the abnormal gait has been going on about 3 years.   Specific Symptoms:  Tremor: No. Family hx of similar:  No. Voice: no change Sleep:   Vivid Dreams:  Yes.    Acting out dreams:  No. Wet Pillows: No. Postural symptoms:  No.  Falls?  No. Bradykinesia symptoms: I drag the R foot and that's what got me in here.  States he has an AFO but was told not to wear it because the foot would get lazy Loss of smell:  No. Loss of taste:  No. Urinary Incontinence:  No. Difficulty Swallowing:   No. Handwriting, micrographia: No. Trouble with ADL's:  No.  Trouble buttoning clothing: No. Depression:  No. Memory changes:  No. Hallucinations:  No.  visual distortions: No. N/V:  No. Lightheaded:  No.  Syncope: No. Diplopia:  No.   Neuroimaging of the brain has not previously been performed.    ALLERGIES:   Allergies  Allergen Reactions   Metformin  And Related Other (See Comments)    GI upset, diarrhea, wt loss    CURRENT MEDICATIONS:  Current Meds  Medication Sig   acarbose  (PRECOSE ) 50 MG tablet TAKE 1 TABLET BY MOUTH THREE TIMES DAILY WITH MEALS   amLODipine  (NORVASC ) 5 MG tablet TAKE 1 TABLET(5 MG) BY MOUTH DAILY   aspirin  EC 81 MG tablet Take 1 tablet (81 mg total) by mouth daily.   blood glucose meter kit and supplies KIT Inject 1 each into the skin 2 (two) times daily. Dispense based on patient and insurance preference. Use up to four times daily as directed. (FOR ICD-9 250.00, 250.01).   Blood Glucose Monitoring Suppl (FREESTYLE LITE) DEVI USE TO CHECK GLUCOSE UP TO 4 TIMES DAILY AS DIRECTED   Blood Glucose Monitoring Suppl (ONE TOUCH ULTRA 2) w/Device KIT Use to check bloods sugars twice a day   Cholecalciferol (VITAMIN D ) 50 MCG (2000 UT) CAPS Take 1 capsule by mouth daily.   glipiZIDE  (GLUCOTROL  XL)  5 MG 24 hr tablet TAKE 1 TABLET(5 MG) BY MOUTH DAILY WITH BREAKFAST   glucose blood (ONETOUCH ULTRA) test strip USE 1 STRIP TO CHECK GLUCOSE TWICE DAILY   JANUVIA  100 MG tablet Take 1 tablet (100 mg total) by mouth daily.   losartan  (COZAAR ) 50 MG tablet TAKE 1 TABLET BY MOUTH IN THE MORNING AND AT BEDTIME   meloxicam  (MOBIC ) 15 MG tablet TAKE 1 TABLET(15 MG) BY MOUTH DAILY   OneTouch Delica Lancets 30G MISC USE 1  TO CHECK GLUCOSE TWICE DAILY   oxybutynin  (DITROPAN ) 5 MG tablet Take 1 tablet (5 mg total) by mouth at bedtime.   predniSONE  (DELTASONE ) 50 MG tablet Take 1 tablet daily with food for 5 days until finished   simvastatin  (ZOCOR ) 20 MG tablet TAKE 1 TABLET  BY MOUTH EVERY NIGHT AT BEDTIME   tamsulosin  (FLOMAX ) 0.4 MG CAPS capsule TAKE 1 CAPSULE(0.4 MG) BY MOUTH DAILY   triamcinolone  (NASACORT ) 55 MCG/ACT AERO nasal inhaler Place 2 sprays into the nose daily.   vitamin B-12 (CYANOCOBALAMIN ) 1000 MCG tablet Take 1,000 mcg by mouth daily.     Objective:   VITALS:   Vitals:   02/25/24 1014  BP: 126/74  Pulse: 81  SpO2: 96%  Weight: 150 lb 3.2 oz (68.1 kg)    GEN:  The patient appears stated age and is in NAD. HEENT:  Normocephalic, atraumatic.  The mucous membranes are moist. The superficial temporal arteries are without ropiness or tenderness. CV:  RRR Lungs:  CTAB Neck/HEME:  There are no carotid bruits bilaterally.  Neurological examination:  Orientation: The patient is alert and oriented x3.  Cranial nerves: There is good facial symmetry.  No significant facial hypomimia.   Extraocular muscles are intact. The visual fields are full to confrontational testing. The speech is fluent and clear. Soft palate rises symmetrically and there is no tongue deviation. Hearing is intact to conversational tone. Sensation: Sensation is intact to light and pinprick throughout (facial, trunk, extremities). Vibration is decreased distally. There is no extinction with double simultaneous stimulation. There is no sensory dermatomal level identified. Motor: Strength is 5/5 in the bilateral upper and lower extremities, with the exception of dorsiflexion of the foot on the right (mild right foot drop).  Shoulder shrug is equal and symmetric.  There is no pronator drift. Deep tendon reflexes: Deep tendon reflexes are 0-1/4 at the bilateral biceps, triceps, brachioradialis, patella and achilles. Plantar responses are downgoing bilaterally.  Movement examination: Tone: There is nl tone in the bilateral upper extremities.  The tone in the lower extremities is nl.  Abnormal movements: none seen even with distraction procedures, but can slightly be  felt Coordination:  There is no decremation with RAM's, with any form of RAMS, including alternating supination and pronation of the forearm, hand opening and closing, finger taps, heel taps bilaterally Gait and Station: The patient pushes off to arise.  He has significant start hesitation and stutter steps.  He is very wide-based in attempting to balance himself.  He holds onto the doorway so he does not fall.  Once on the hallway, he is able to walk fairly normally until he turns and then again he starts freezing and having stutter steps with a very wide base today and starts holding on.  Once he is turned around, he starts walking fairly normally again until in the doorway and freezes again.   I have reviewed and interpreted the following labs independently   Chemistry  Component Value Date/Time   NA 138 05/28/2023 0936   K 3.9 05/28/2023 0936   CL 102 05/28/2023 0936   CO2 29 05/28/2023 0936   BUN 10 05/28/2023 0936   CREATININE 0.87 05/28/2023 0936      Component Value Date/Time   CALCIUM 9.8 05/28/2023 0936   ALKPHOS 69 05/28/2023 0936   AST 11 05/28/2023 0936   ALT 11 05/28/2023 0936   BILITOT 0.7 05/28/2023 0936      Lab Results  Component Value Date   TSH 1.34 05/28/2023   Lab Results  Component Value Date   WBC 3.9 (L) 05/28/2023   HGB 12.5 (L) 05/28/2023   HCT 38.8 (L) 05/28/2023   MCV 81.8 05/28/2023   PLT 120.0 (L) 05/28/2023     Total time spent on today's visit was 46 minutes, including both face-to-face time and nonface-to-face time.  Time included that spent on review of records (prior notes available to me/labs/imaging if pertinent), discussing treatment and goals, answering patient's questions and coordinating care.  Cc:  Norleen Lynwood ORN, MD

## 2024-02-25 ENCOUNTER — Encounter: Payer: Self-pay | Admitting: Neurology

## 2024-02-25 ENCOUNTER — Ambulatory Visit: Admitting: Neurology

## 2024-02-25 VITALS — BP 126/74 | HR 81 | Wt 150.2 lb

## 2024-02-25 DIAGNOSIS — R270 Ataxia, unspecified: Secondary | ICD-10-CM

## 2024-02-25 NOTE — Patient Instructions (Signed)
 A referral to [facility] has been placed for your MRI someone will contact you directly to schedule your appt. They are located at [address] [facility]. Please contact them directly by calling [phone] with any questions regarding your referral.  As we discussed, we are going to do a DaT scan.  We discussed that this is not a diagnostic scan, but will just give us  some information on dopamine levels in the brain.  Here is some information which may be helpful to you.  Before the Exam  Please tell the nurse, nuclear imaging technician or nuclear medicine physician if you are pregnant, nursing or have reduced liver function. Please also inform us  if you have an allergy or sensitivity to iodine .  The test may be completed with those who are allergic to iodine , but may require pre-medication with other medications to help avoid reactions. If you need to cancel the examination, please give us  at least 24 hours notice.  Before your scan, stop taking these medicines for the length of time shown: Name of Drug Stop Taking  Amoxapine 4 days before  Benztropine  Cogentin 3 days before  Bupropion (Aplenzin, Budeprion, Voxra, Wellbutrin, Zyban) 8 days before  Buspirone 15 hours before  Citalopram 24 hours before  Cocaine 6 hours before  Escitalopram 24 hours before  Methamphetamine 24 hours before  Methylphenidate (Concerta, Metadate, Methylin, Ritalin) 20 hours before  Paroxetine 24 hours before  Selegilene 48 hours before  Sertraline 3 days before    On the Day of the Exam Drink plenty of fluids and go to the bathroom frequently (and for two days after your exam) Wear loose comfortable clothing, since you will need to lie still for a period of time. Please bring a list of all medications that you are taking; name and dosage. We want to make your waiting time as pleasant as possible. Consider bringing your favorite magazine, book or music player to help you pass the time.  You do not  need to stay at the imaging facility the entire time, between the initial injection and the scan itself.   Please leave your jewelry and valuables at home.  During the Exam The DaTscan once started takes approximately 30-45 minutes. However, following injection of the DaT agent approximately 3-6 hours are required before the agent has achieved appropriate concentration in the brain.  We will inject the DaTscan through an intravenous (IV) line into your arm in the AM, usually around 8-9am, and then you will come back usually in the mid afternoon for the scan. Before the exam, you will receive a drug to allow you to protect the thyroid . For the imaging test, you will be asked to lie on a table and an imaging technologist will position your head in a headrest. A strip of tape or a flexible restraint may be placed around your head to help you to not move your head during the scan. A camera will be positioned above you and you must remain very still for about 30 minute while images are taken. The scanner will be very close to your head, but will not touch your head.

## 2024-02-28 ENCOUNTER — Other Ambulatory Visit: Payer: Self-pay | Admitting: Internal Medicine

## 2024-03-05 ENCOUNTER — Encounter: Payer: Self-pay | Admitting: Neurology

## 2024-03-11 ENCOUNTER — Ambulatory Visit
Admission: RE | Admit: 2024-03-11 | Discharge: 2024-03-11 | Disposition: A | Discharge status: Home / Self Care | Source: Ambulatory Visit | Attending: Neurology | Admitting: Neurology

## 2024-03-11 DIAGNOSIS — R270 Ataxia, unspecified: Secondary | ICD-10-CM

## 2024-03-12 ENCOUNTER — Ambulatory Visit: Payer: Self-pay | Admitting: Neurology

## 2024-03-19 ENCOUNTER — Other Ambulatory Visit: Payer: Self-pay

## 2024-03-25 DIAGNOSIS — E113293 Type 2 diabetes mellitus with mild nonproliferative diabetic retinopathy without macular edema, bilateral: Secondary | ICD-10-CM | POA: Diagnosis not present

## 2024-03-25 DIAGNOSIS — Z961 Presence of intraocular lens: Secondary | ICD-10-CM | POA: Diagnosis not present

## 2024-03-25 DIAGNOSIS — H26492 Other secondary cataract, left eye: Secondary | ICD-10-CM | POA: Diagnosis not present

## 2024-03-25 LAB — HM DIABETES EYE EXAM

## 2024-03-31 ENCOUNTER — Other Ambulatory Visit: Payer: Self-pay

## 2024-03-31 DIAGNOSIS — R251 Tremor, unspecified: Secondary | ICD-10-CM

## 2024-03-31 MED ORDER — LANCETS 30G MISC: 1.0000 | Freq: Every day | 0 refills | Status: AC

## 2024-04-27 ENCOUNTER — Encounter (HOSPITAL_COMMUNITY)

## 2024-04-27 ENCOUNTER — Encounter (HOSPITAL_COMMUNITY): Admission: RE | Admit: 2024-04-27 | Source: Ambulatory Visit

## 2024-04-29 ENCOUNTER — Ambulatory Visit: Admitting: Neurology

## 2024-05-06 ENCOUNTER — Encounter (HOSPITAL_COMMUNITY)
Admission: RE | Admit: 2024-05-06 | Discharge: 2024-05-06 | Disposition: A | Discharge status: Home / Self Care | Source: Ambulatory Visit | Attending: Neurology | Admitting: Neurology

## 2024-05-06 ENCOUNTER — Encounter (HOSPITAL_COMMUNITY): Payer: Self-pay

## 2024-05-06 ENCOUNTER — Ambulatory Visit: Payer: Self-pay | Admitting: Neurology

## 2024-05-06 DIAGNOSIS — R251 Tremor, unspecified: Secondary | ICD-10-CM | POA: Insufficient documentation

## 2024-05-06 MED ORDER — POTASSIUM IODIDE (ANTIDOTE) 130 MG PO TABS
ORAL_TABLET | ORAL | Status: AC
  Filled 2024-05-06: qty 1

## 2024-05-07 NOTE — Progress Notes (Signed)
 Called and left voicemail message.

## 2024-05-11 ENCOUNTER — Other Ambulatory Visit: Payer: Self-pay | Admitting: Internal Medicine

## 2024-05-11 DIAGNOSIS — E118 Type 2 diabetes mellitus with unspecified complications: Secondary | ICD-10-CM

## 2024-05-11 NOTE — Progress Notes (Signed)
 Please to contact pt - he is due for refills and ROV after nov 20 please, thanks

## 2024-05-21 NOTE — Progress Notes (Signed)
 Assessment/Plan:   1.  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  This correlates well with a normal DaTscan which was completed May 06, 2024.  - MRI brain did have moderate small vessel disease, although not a lot of that disease was in the basal ganglia.  There was a right thalamic lacunar infarction.  -discussed walker.  he did really well on the U step in my office today and I would like to see him on a walker.  Info given on the U-step today.  -would like to refer to PT.  He will think about it  2.  Lumbar radiculopathy with prior laminectomy and foot drop  - Patient follows with Dr. [name] Subjective:   Alex Massey Neighbor was seen today in follow up for parkinsonism, which was suspected vascular at last visit.  Pt with wife who supplements history.  Pt with wife who supplements hx.   DaTscan was completed and was normal.  MRI brain demonstrated moderate atrophy and mild to moderate small vessel disease, although there was not a lot in the basal ganglia.  There was a right thalamic lacunar infarct.   ALLERGIES:   Allergies  Allergen Reactions   Metformin  And Related Other (See Comments)    GI upset, diarrhea, wt loss    CURRENT MEDICATIONS:  Current Meds  Medication Sig   acarbose  (PRECOSE ) 50 MG tablet TAKE 1 TABLET BY MOUTH THREE TIMES DAILY WITH MEALS   amLODipine  (NORVASC ) 5 MG tablet TAKE 1 TABLET(5 MG) BY MOUTH DAILY   aspirin  EC 81 MG tablet Take 1 tablet (81 mg total) by mouth daily.   blood glucose meter kit and supplies KIT Inject 1 each into the skin 2 (two) times daily. Dispense based on patient and insurance preference. Use up to four times daily as directed. (FOR ICD-9 250.00, 250.01).   Blood Glucose Monitoring Suppl (FREESTYLE LITE) DEVI USE TO CHECK GLUCOSE UP TO 4 TIMES DAILY AS DIRECTED   Blood Glucose Monitoring Suppl (ONE TOUCH ULTRA 2) w/Device KIT Use to check bloods sugars twice a day   Cholecalciferol (VITAMIN D )  50 MCG (2000 UT) CAPS Take 1 capsule by mouth daily.   glipiZIDE  (GLUCOTROL  XL) 5 MG 24 hr tablet TAKE 1 TABLET(5 MG) BY MOUTH DAILY WITH BREAKFAST   glucose blood (ONETOUCH ULTRA) test strip USE 1 STRIP TO CHECK GLUCOSE TWICE DAILY   JANUVIA  100 MG tablet Take 1 tablet (100 mg total) by mouth daily.   Lancets 30G MISC 1 Lancet by Does not apply route daily.   losartan  (COZAAR ) 50 MG tablet TAKE 1 TABLET BY MOUTH IN THE MORNING AND AT BEDTIME   meloxicam  (MOBIC ) 15 MG tablet TAKE 1 TABLET(15 MG) BY MOUTH DAILY   oxybutynin  (DITROPAN ) 5 MG tablet Take 1 tablet (5 mg total) by mouth at bedtime.   predniSONE  (DELTASONE ) 50 MG tablet Take 1 tablet daily with food for 5 days until finished   simvastatin  (ZOCOR ) 20 MG tablet TAKE 1 TABLET BY MOUTH EVERY NIGHT AT BEDTIME   tamsulosin  (FLOMAX ) 0.4 MG CAPS capsule TAKE 1 CAPSULE(0.4 MG) BY MOUTH DAILY   triamcinolone  (NASACORT ) 55 MCG/ACT AERO nasal inhaler Place 2 sprays into the nose daily.   vitamin B-12 (CYANOCOBALAMIN ) 1000 MCG tablet Take 1,000 mcg by mouth daily.     Objective:   PHYSICAL EXAMINATION:    VITALS:   Vitals:   05/24/24 1434  BP: 134/88  Pulse: 74  SpO2: 98%  Weight: 150 lb (  68 kg)    GEN:  The patient appears stated age and is in NAD. HEENT:  Normocephalic, atraumatic.  The mucous membranes are moist. The superficial temporal arteries are without ropiness or tenderness. CV:  RRR Lungs:  CTAB Neck/HEME:  There are no carotid bruits bilaterally.  Neurological examination:  Orientation: The patient is alert and oriented x3. Cranial nerves: There is good facial symmetry without facial hypomimia. The speech is fluent and clear. Soft palate rises symmetrically and there is no tongue deviation. Hearing is intact to conversational tone. Sensation: Sensation is intact to light touch throughout Motor: Strength is at least antigravity x4.  Movement examination: Tone: There is nl tone in the UE/LE Abnormal movements:  none Coordination:  There is no decremation with RAM's Gait and Station: The patient pushes off to arise.  He has significant start hesitation and stutter steps.  He is very wide-based in attempting to balance himself.  He holds onto the doorway so he does not fall.  Once on the hallway, he is able to walk fairly normally until he turns and then again he starts freezing and having stutter steps when he goes to turn and nearly falls in the turn but able to catch himself.    He is then given the U step walker and he still has some initial stutter steps but then does much better in the hall and has no trouble in the turn or back in the doorway.  I have reviewed and interpreted the following labs independently    Chemistry      Component Value Date/Time   NA 138 05/28/2023 0936   K 3.9 05/28/2023 0936   CL 102 05/28/2023 0936   CO2 29 05/28/2023 0936   BUN 10 05/28/2023 0936   CREATININE 0.87 05/28/2023 0936      Component Value Date/Time   CALCIUM 9.8 05/28/2023 0936   ALKPHOS 69 05/28/2023 0936   AST 11 05/28/2023 0936   ALT 11 05/28/2023 0936   BILITOT 0.7 05/28/2023 0936       Lab Results  Component Value Date   WBC 3.9 (L) 05/28/2023   HGB 12.5 (L) 05/28/2023   HCT 38.8 (L) 05/28/2023   MCV 81.8 05/28/2023   PLT 120.0 (L) 05/28/2023    Lab Results  Component Value Date   TSH 1.34 05/28/2023     Total time spent on today's visit was 40 minutes, including both face-to-face time and nonface-to-face time.  Time included that spent on review of records (prior notes available to me/labs/imaging if pertinent), discussing treatment and goals, answering patient's questions and coordinating care.  Cc:  Norleen Lynwood ORN, MD

## 2024-05-24 ENCOUNTER — Ambulatory Visit: Admitting: Neurology

## 2024-05-24 ENCOUNTER — Encounter: Payer: Self-pay | Admitting: Neurology

## 2024-05-24 VITALS — BP 134/88 | HR 74 | Wt 150.0 lb

## 2024-05-24 DIAGNOSIS — G214 Vascular parkinsonism: Secondary | ICD-10-CM | POA: Diagnosis not present

## 2024-05-24 NOTE — Patient Instructions (Signed)
 I want you to consider physical therapy and you can call me back and let me know.  They can work with you on the walker as well.  I want you to use a walker at all times  The physicians and staff at [facility] are committed to providing excellent care. You may receive a survey requesting feedback about your experience at our office. We strive to receive very good responses to the survey questions. If you feel that your experience would prevent you from giving the office a very good  response, please contact our office to try to remedy the situation. We may be reached at [phone]. Thank you for taking the time out of your busy day to complete the survey.

## 2024-06-22 ENCOUNTER — Ambulatory Visit: Admitting: Internal Medicine

## 2024-06-22 ENCOUNTER — Encounter: Payer: Self-pay | Admitting: Internal Medicine

## 2024-06-22 ENCOUNTER — Ambulatory Visit: Payer: Self-pay | Admitting: Internal Medicine

## 2024-06-22 VITALS — BP 122/84 | HR 85 | Temp 97.8°F | Ht 66.0 in | Wt 152.0 lb

## 2024-06-22 DIAGNOSIS — E782 Mixed hyperlipidemia: Secondary | ICD-10-CM

## 2024-06-22 DIAGNOSIS — E1165 Type 2 diabetes mellitus with hyperglycemia: Secondary | ICD-10-CM

## 2024-06-22 DIAGNOSIS — E538 Deficiency of other specified B group vitamins: Secondary | ICD-10-CM

## 2024-06-22 DIAGNOSIS — I10 Essential (primary) hypertension: Secondary | ICD-10-CM

## 2024-06-22 DIAGNOSIS — Z7984 Long term (current) use of oral hypoglycemic drugs: Secondary | ICD-10-CM | POA: Diagnosis not present

## 2024-06-22 DIAGNOSIS — E559 Vitamin D deficiency, unspecified: Secondary | ICD-10-CM

## 2024-06-22 DIAGNOSIS — Z0000 Encounter for general adult medical examination without abnormal findings: Secondary | ICD-10-CM

## 2024-06-22 DIAGNOSIS — Z0001 Encounter for general adult medical examination with abnormal findings: Secondary | ICD-10-CM

## 2024-06-22 LAB — URINALYSIS, ROUTINE W REFLEX MICROSCOPIC
Bilirubin Urine: NEGATIVE
Hgb urine dipstick: NEGATIVE
Leukocytes,Ua: NEGATIVE
Nitrite: NEGATIVE
RBC / HPF: NONE SEEN (ref 0–?)
Specific Gravity, Urine: >=1.03 — AB (ref 1.000–1.030)
Total Protein, Urine: NEGATIVE
Urine Glucose: NEGATIVE
Urobilinogen, UA: 0.2 (ref 0.0–1.0)
pH: 5.5 (ref 5.0–8.0)

## 2024-06-22 LAB — BASIC METABOLIC PANEL WITH GFR
BUN: 10 mg/dL (ref 6–23)
CO2: 27 meq/L (ref 19–32)
Calcium: 10.2 mg/dL (ref 8.4–10.5)
Chloride: 97 meq/L (ref 96–112)
Creatinine, Ser: 0.75 mg/dL (ref 0.40–1.50)
GFR: 84.09 mL/min (ref >60.00)
Glucose, Bld: 117 mg/dL — ABNORMAL HIGH (ref 70–99)
Potassium: 4.1 meq/L (ref 3.5–5.1)
Sodium: 132 meq/L — ABNORMAL LOW (ref 135–145)

## 2024-06-22 LAB — CBC WITH DIFFERENTIAL/PLATELET
Basophils Absolute: 0.1 K/uL (ref 0.0–0.1)
Basophils Relative: 1.4 % (ref 0.0–3.0)
Eosinophils Absolute: 0.1 K/uL (ref 0.0–0.7)
Eosinophils Relative: 1.8 % (ref 0.0–5.0)
HCT: 35.2 % — ABNORMAL LOW (ref 39.0–52.0)
Hemoglobin: 11.3 g/dL — ABNORMAL LOW (ref 13.0–17.0)
Lymphocytes Relative: 24.9 % (ref 12.0–46.0)
Lymphs Abs: 1.5 K/uL (ref 0.7–4.0)
MCHC: 32.1 g/dL (ref 30.0–36.0)
MCV: 77.8 fl — ABNORMAL LOW (ref 78.0–100.0)
Monocytes Absolute: 0.8 K/uL (ref 0.1–1.0)
Monocytes Relative: 13.3 % — ABNORMAL HIGH (ref 3.0–12.0)
Neutro Abs: 3.5 K/uL (ref 1.4–7.7)
Neutrophils Relative %: 58.6 % (ref 43.0–77.0)
Platelets: 184 K/uL (ref 150.0–400.0)
RBC: 4.52 Mil/uL (ref 4.22–5.81)
RDW: 15.7 % — ABNORMAL HIGH (ref 11.5–15.5)
WBC: 6 K/uL (ref 4.0–10.5)

## 2024-06-22 LAB — MICROALBUMIN / CREATININE URINE RATIO
Creatinine,U: 223.3 mg/dL
Microalb Creat Ratio: 7.3 mg/g (ref 0.0–30.0)
Microalb, Ur: 1.6 mg/dL (ref 0.7–1.9)

## 2024-06-22 LAB — LIPID PANEL
Cholesterol: 115 mg/dL (ref 28–200)
HDL: 50.1 mg/dL (ref >39.00)
LDL Cholesterol: 52 mg/dL (ref 10–99)
NonHDL: 64.85
Total CHOL/HDL Ratio: 2
Triglycerides: 65 mg/dL (ref 10.0–149.0)
VLDL: 13 mg/dL (ref 0.0–40.0)

## 2024-06-22 LAB — TSH: TSH: 1.53 u[IU]/mL (ref 0.35–5.50)

## 2024-06-22 LAB — HEPATIC FUNCTION PANEL
ALT: 5 U/L (ref 3–53)
AST: 7 U/L (ref 5–37)
Albumin: 4.3 g/dL (ref 3.5–5.2)
Alkaline Phosphatase: 74 U/L (ref 39–117)
Bilirubin, Direct: 0.1 mg/dL (ref 0.1–0.3)
Total Bilirubin: 0.6 mg/dL (ref 0.2–1.2)
Total Protein: 7.4 g/dL (ref 6.0–8.3)

## 2024-06-22 LAB — HEMOGLOBIN A1C: Hgb A1c MFr Bld: 6.4 % (ref 4.6–6.5)

## 2024-06-22 LAB — VITAMIN B12: Vitamin B-12: >1500 pg/mL — ABNORMAL HIGH (ref 211–911)

## 2024-06-22 LAB — VITAMIN D 25 HYDROXY (VIT D DEFICIENCY, FRACTURES): VITD: 35.12 ng/mL (ref 30.00–100.00)

## 2024-06-22 MED ORDER — BLOOD GLUCOSE TEST VI STRP: 1.0000 | ORAL_STRIP | 3 refills | Status: AC

## 2024-06-22 MED ORDER — LANCET DEVICE MISC: 1.0000 | 3 refills | Status: AC

## 2024-06-22 MED ORDER — BLOOD GLUCOSE MONITORING SUPPL DEVI: 1.0000 | 0 refills | Status: AC

## 2024-06-22 NOTE — Assessment & Plan Note (Signed)
 Lab Results  Component Value Date   VITAMINB12 >1500 (H) 06/22/2024   Stable, cont oral replacement - b12 1000 mcg qd

## 2024-06-22 NOTE — Assessment & Plan Note (Signed)

## 2024-06-22 NOTE — Assessment & Plan Note (Signed)
 Lab Results  Component Value Date   LDLCALC 52 06/22/2024   Stable, pt to continue current statin zocor  20 mg qhs

## 2024-06-22 NOTE — Assessment & Plan Note (Signed)
 Last vitamin D  Lab Results  Component Value Date   VD25OH 35.12 06/22/2024   Low, to start oral replacement

## 2024-06-22 NOTE — Assessment & Plan Note (Signed)
 BP Readings from Last 3 Encounters:  06/22/24 122/84  05/24/24 134/88  02/25/24 126/74   Stable, pt to continue medical treatment norvasc  5 every day, losartan  50 qd

## 2024-06-22 NOTE — Progress Notes (Signed)
 Patient ID: Alex Massey, male   DOB: 04-Sep-1940, 83 y.o.   MRN: [id number]         Chief Complaint:: wellness exam and dm, low vit d and b12, hld, htn,        HPI:  Alex Massey is a 83 y.o. male here for wellness exam; up to date                        Also here with wife who mentions mild worsening memory changes, has been seeing neurology for vascular parkinsonism.  Pt denies chest pain, increased sob or doe, wheezing, orthopnea, PND, increased LE swelling, palpitations, dizziness or syncope.   Pt denies polydipsia, polyuria, or new focal neuro s/s.    Pt denies fever, wt loss, night sweats, loss of appetite, or other constitutional symptoms     Wt Readings from Last 3 Encounters:  06/22/24 152 lb (68.9 kg)  05/24/24 150 lb (68 kg)  02/25/24 150 lb 3.2 oz (68.1 kg)   BP Readings from Last 3 Encounters:  06/22/24 122/84  05/24/24 134/88  02/25/24 126/74   Immunization History  Administered Date(s) Administered   Fluad Quad(high Dose 65+) 04/06/2020, 04/04/2021, 04/27/2023, 05/22/2024   INFLUENZA, HIGH DOSE SEASONAL PF 05/13/2013, 03/14/2016, 03/28/2017, 03/16/2018   Influenza Split 04/09/2011, 04/13/2012   Influenza Whole 04/04/2008, 04/19/2009, 04/12/2010   Influenza,inj,Quad PF,6+ Mos 04/06/2014   Influenza-Unspecified 05/08/2019, 04/05/2022   Moderna Sars-Covid-2 Vaccination 09/03/2019, 11/10/2019, 04/18/2020   Pneumococcal Conjugate-13 09/12/2014   Pneumococcal Polysaccharide-23 01/02/2010   Td 01/02/2010   Tdap 11/27/2020   Zoster Recombinant(Shingrix) 02/05/2022, 04/07/2022   Zoster, Live 01/07/2012   Health Maintenance Due  Topic Date Due   Medicare Annual Wellness (AWV)  07/23/2024      Past Medical History:  Diagnosis Date   Acquired right foot drop    post lumbar surgery,  wears brace   Allergy    B12 deficiency    Chronic constipation    diet   ED (erectile dysfunction)    History of kidney stones    Hyperlipidemia    Hyperplasia of prostate  with lower urinary tract symptoms (LUTS)    Hypertension    Insomnia    Neuropathy, peripheral    right foot   Presence of surgical incision 01/21/2019   s/p  left carpal tunnel release --  pt has dressing on,  denies pain or numbness   Prostate cancer [facility]) urologist-- dr eskridge/  oncologist-  dr patrcia   dx 08-04-2018,  Stage T1c, Gleason 4+4   Renal cyst, right    Type 2 diabetes mellitus (HCC)    followed by pcp   Wears partial dentures    upper and lower   Past Surgical History:  Procedure Laterality Date   CARPAL TUNNEL RELEASE Bilateral left 01-21-2019;  right 1980s   CATARACT EXTRACTION Left 03/2020   CYSTOSCOPY N/A 01/26/2019   Procedure: CYSTOSCOPY FLEXIBLE;  Surgeon: Nieves Cough, MD;  Location: [facility];  Service: Urology;  Laterality: N/A;  no seeds found in bladder   LUMBAR DISC SURGERY  1984  L4-5;  1998 L4--S1   LUMBAR EPIDURAL INJECTION  2009, 2006   dr eldonna   LUMBAR MICRODISCECTOMY  05-13-2002  dr chloe  @[facility]    L4-5   RADIOACTIVE SEED IMPLANT N/A 01/26/2019   Procedure: RADIOACTIVE SEED IMPLANT/BRACHYTHERAPY IMPLANT;  Surgeon: Nieves Cough, MD;  Location[facility]  Service: Urology;  Laterality: N/A;  54 seeds implanted   SPACE OAR INSTILLATION N/A 01/26/2019   Procedure: SPACE OAR INSTILLATION;  Surgeon: Nieves Cough, MD;  Location: [facility];  Service: Urology;  Laterality: N/A;    reports that he quit smoking about 21 years ago. His smoking use included cigarettes. He started smoking about 41 years ago. He has never used smokeless tobacco. He reports current alcohol use of about 3.0 standard drinks of alcohol per week. He reports that he does not use drugs. family history includes Colon cancer (age of onset: 24) in his brother; Diabetes in his brother and father; Healthy in his daughter, daughter, and sister; Prostate cancer in his brother, cousin, and nephew. Allergies[1] Medications  Ordered Prior to Encounter[2]      ROS:  All others reviewed and negative.  Objective        PE:  BP 122/84 (BP Location: Right Arm, Patient Position: Sitting, Cuff Size: Normal)   Pulse 85   Temp 97.8 F (36.6 C) (Oral)   Ht 5' 6 (1.676 m)   Wt 152 lb (68.9 kg)   SpO2 99%   BMI 24.53 kg/m                 Constitutional: Pt appears in NAD               HENT: Head: NCAT.                Right Ear: External ear normal.                 Left Ear: External ear normal.                Eyes: . Pupils are equal, round, and reactive to light. Conjunctivae and EOM are normal               Nose: without d/c or deformity               Neck: Neck supple. Gross normal ROM               Cardiovascular: Normal rate and regular rhythm.                 Pulmonary/Chest: Effort normal and breath sounds without rales or wheezing.                Abd:  Soft, NT, ND, + BS, no organomegaly               Neurological: Pt is alert. At baseline orientation, motor grossly intact               Skin: Skin is warm. No rashes, no other new lesions, LE edema - none               Psychiatric: Pt behavior is normal without agitation   Micro: none  Cardiac tracings I have personally interpreted today:  none  Pertinent Radiological findings (summarize): none   Lab Results  Component Value Date   WBC 6.0 06/22/2024   HGB 11.3 (L) 06/22/2024   HCT 35.2 (L) 06/22/2024   PLT 184.0 06/22/2024   GLUCOSE 117 (H) 06/22/2024   CHOL 115 06/22/2024   TRIG 65.0 06/22/2024   HDL 50.10 06/22/2024   LDLCALC 52 06/22/2024   ALT 5 06/22/2024   AST 7 06/22/2024   NA 132 (L) 06/22/2024   K 4.1 06/22/2024   CL 97 06/22/2024   CREATININE 0.75 06/22/2024   BUN 10 06/22/2024  CO2 27 06/22/2024   TSH 1.53 06/22/2024   PSA 0.01 (L) 11/27/2020   INR 1.0 01/22/2019   HGBA1C 6.4 06/22/2024   MICROALBUR 1.6 06/22/2024   Assessment/Plan:  Alex Massey is a 83 y.o. Black or African American [2] male with  has a past  medical history of Acquired right foot drop, Allergy, B12 deficiency, Chronic constipation, ED (erectile dysfunction), History of kidney stones, Hyperlipidemia, Hyperplasia of prostate with lower urinary tract symptoms (LUTS), Hypertension, Insomnia, Neuropathy, peripheral, Presence of surgical incision (01/21/2019), Prostate cancer [facility]) (urologist-- dr eskridge/  oncologist-  dr patrcia), Renal cyst, right, Type 2 diabetes mellitus (HCC), and Wears partial dentures.  Encounter for well adult exam with abnormal findings Age and sex appropriate education and counseling updated with regular exercise and diet Referrals for preventative services - none needed Immunizations addressed - none needed Smoking counseling  - none needed Evidence for depression or other mood disorder - none significant Most recent labs reviewed. I have personally reviewed and have noted: 1) the patient's medical and social history 2) The patient's current medications and supplements 3) The patient's height, weight, and BMI have been recorded in the chart   Vitamin D  deficiency Last vitamin D  Lab Results  Component Value Date   VD25OH 35.12 06/22/2024   Low, to start oral replacement   Vitamin B12 deficiency Lab Results  Component Value Date   VITAMINB12 >1500 (H) 06/22/2024   Stable, cont oral replacement - b12 1000 mcg qd   Hyperlipidemia Lab Results  Component Value Date   LDLCALC 52 06/22/2024   Stable, pt to continue current statin zocor  20 mg qhs   Essential hypertension BP Readings from Last 3 Encounters:  06/22/24 122/84  05/24/24 134/88  02/25/24 126/74   Stable, pt to continue medical treatment norvasc  5 every day, losartan  50 qd   Diabetes (HCC) Lab Results  Component Value Date   HGBA1C 6.4 06/22/2024   Stable, pt to continue current medical treatment precose  50 tid, glipizide  xl 5 every day, januvia  100 qd  Followup: Return in about 6 months (around 12/21/2024).  Lynwood Rush,  MD 06/22/2024 9:09 PM [facility]     [1]  Allergies Allergen Reactions   Metformin  And Related Other (See Comments)    GI upset, diarrhea, wt loss  [2]  Current Outpatient Medications on File Prior to Visit  Medication Sig Dispense Refill   acarbose  (PRECOSE ) 50 MG tablet TAKE 1 TABLET BY MOUTH THREE TIMES DAILY WITH MEALS 270 tablet 1   amLODipine  (NORVASC ) 5 MG tablet TAKE 1 TABLET(5 MG) BY MOUTH DAILY 90 tablet 3   aspirin  EC 81 MG tablet Take 1 tablet (81 mg total) by mouth daily. 150 tablet 2   blood glucose meter kit and supplies KIT Inject 1 each into the skin 2 (two) times daily. Dispense based on patient and insurance preference. Use up to four times daily as directed. (FOR ICD-9 250.00, 250.01). 1 each 0   Blood Glucose Monitoring Suppl (ONE TOUCH ULTRA 2) w/Device KIT Use to check bloods sugars twice a day 1 kit 0   Cholecalciferol (VITAMIN D ) 50 MCG (2000 UT) CAPS Take 1 capsule by mouth daily.     glipiZIDE  (GLUCOTROL  XL) 5 MG 24 hr tablet TAKE 1 TABLET(5 MG) BY MOUTH DAILY WITH BREAKFAST 90 tablet 3   JANUVIA  100 MG tablet Take 1 tablet (100 mg total) by mouth daily. 90 tablet 1  Lancets 30G MISC 1 Lancet by Does not apply route daily. 100 each 0   losartan  (COZAAR ) 50 MG tablet TAKE 1 TABLET BY MOUTH IN THE MORNING AND AT BEDTIME 180 tablet 2   meloxicam  (MOBIC ) 15 MG tablet TAKE 1 TABLET(15 MG) BY MOUTH DAILY 30 tablet 3   oxybutynin  (DITROPAN ) 5 MG tablet Take 1 tablet (5 mg total) by mouth at bedtime. 90 tablet 3   predniSONE  (DELTASONE ) 50 MG tablet Take 1 tablet daily with food for 5 days until finished 5 tablet 0   simvastatin  (ZOCOR ) 20 MG tablet TAKE 1 TABLET BY MOUTH EVERY NIGHT AT BEDTIME 90 tablet 1   tamsulosin  (FLOMAX ) 0.4 MG CAPS capsule TAKE 1 CAPSULE(0.4 MG) BY MOUTH DAILY 90 capsule 2   triamcinolone  (NASACORT ) 55 MCG/ACT AERO nasal inhaler Place 2 sprays into the nose daily. 1 each 12    vitamin B-12 (CYANOCOBALAMIN ) 1000 MCG tablet Take 1,000 mcg by mouth daily.     No current facility-administered medications on file prior to visit.

## 2024-06-22 NOTE — Patient Instructions (Signed)

## 2024-06-22 NOTE — Assessment & Plan Note (Signed)
 Lab Results  Component Value Date   HGBA1C 6.4 06/22/2024   Stable, pt to continue current medical treatment precose  50 tid, glipizide  xl 5 every day, januvia  100 qd With hyperglycemia

## 2024-07-22 ENCOUNTER — Other Ambulatory Visit: Payer: Self-pay | Admitting: Internal Medicine

## 2024-07-22 MED ORDER — ACARBOSE 50 MG PO TABS: 50.0000 mg | ORAL_TABLET | Freq: Three times a day (TID) | ORAL | 1 refills | Status: AC

## 2024-07-22 NOTE — Telephone Encounter (Signed)
 Copied from CRM [id number]. Topic: Clinical - Medication Refill >> Jul 22, 2024 12:11 PM Macario HERO wrote: Medication: acarbose  (PRECOSE ) 50 MG tablet [[id number]]  Has the patient contacted their pharmacy? Yes (Agent: If no, request that the patient contact the pharmacy for the refill. If patient does not wish to contact the pharmacy document the reason why and proceed with request.) (Agent: If yes, when and what did the pharmacy advise?)  This is the patient's preferred pharmacy:  [phone] - [place], [place] - [address] AT [facility] [address] [place] [address] Phone: [phone] Fax: [phone]   Is this the correct pharmacy for this prescription? Yes If no, delete pharmacy and type the correct one.   Has the prescription been filled recently? Yes  Is the patient out of the medication? Yes  Has the patient been seen for an appointment in the last year OR does the patient have an upcoming appointment? Yes  Can we respond through MyChart? No  Agent: Please be advised that Rx refills may take up to 3 business days. We ask that you follow-up with your pharmacy.

## 2024-07-22 NOTE — Telephone Encounter (Signed)
 Copied from CRM [id number]. Topic: Clinical - Medication Refill >> Jul 22, 2024 12:20 PM China J wrote: Medication: acarbose  (PRECOSE ) 50 MG tablet  Has the patient contacted their pharmacy? Yes (Agent: If no, request that the patient contact the pharmacy for the refill. If patient does not wish to contact the pharmacy document the reason why and proceed with request.) (Agent: If yes, when and what did the pharmacy advise?)  This is the patient's preferred pharmacy:  [facility] [phone] - [place], [place] - [address] AT [facility] OF [address]  [place] [address] Phone: [phone] Fax: [phone]  Is this the correct pharmacy for this prescription? Yes If no, delete pharmacy and type the correct one.   Has the prescription been filled recently? No  Is the patient out of the medication? Yes  Has the patient been seen for an appointment in the last year OR does the patient have an upcoming appointment? Yes  Can we respond through MyChart? No  Agent: Please be advised that Rx refills may take up to 3 business days. We ask that you follow-up with your pharmacy.

## 2024-07-26 ENCOUNTER — Ambulatory Visit: Payer: Medicare Other

## 2024-12-17 ENCOUNTER — Encounter: Admitting: Nurse Practitioner

## 2025-02-11 ENCOUNTER — Ambulatory Visit

## 2025-02-21 ENCOUNTER — Ambulatory Visit: Admitting: Neurology
